# Patient Record
Sex: Female | Born: 1950 | Race: White | Hispanic: No | Marital: Married | Smoking: Never smoker
Health system: Southern US, Community
[De-identification: ages and names within clinical notes are randomized; demographics above are authoritative.]

## PROBLEM LIST (undated history)

## (undated) DIAGNOSIS — D649 Anemia, unspecified: Secondary | ICD-10-CM

## (undated) DIAGNOSIS — K5790 Diverticulosis of intestine, part unspecified, without perforation or abscess without bleeding: Secondary | ICD-10-CM

## (undated) DIAGNOSIS — S82092A Other fracture of left patella, initial encounter for closed fracture: Secondary | ICD-10-CM

## (undated) DIAGNOSIS — Z5189 Encounter for other specified aftercare: Secondary | ICD-10-CM

## (undated) DIAGNOSIS — J302 Other seasonal allergic rhinitis: Secondary | ICD-10-CM

## (undated) DIAGNOSIS — T7840XA Allergy, unspecified, initial encounter: Secondary | ICD-10-CM

## (undated) DIAGNOSIS — M1990 Unspecified osteoarthritis, unspecified site: Secondary | ICD-10-CM

## (undated) DIAGNOSIS — J189 Pneumonia, unspecified organism: Secondary | ICD-10-CM

## (undated) DIAGNOSIS — R51 Headache: Secondary | ICD-10-CM

## (undated) DIAGNOSIS — K219 Gastro-esophageal reflux disease without esophagitis: Secondary | ICD-10-CM

## (undated) DIAGNOSIS — J40 Bronchitis, not specified as acute or chronic: Secondary | ICD-10-CM

## (undated) DIAGNOSIS — I10 Essential (primary) hypertension: Secondary | ICD-10-CM

## (undated) HISTORY — DX: Anemia, unspecified: D64.9

## (undated) HISTORY — DX: Unspecified osteoarthritis, unspecified site: M19.90

## (undated) HISTORY — PX: JOINT REPLACEMENT: SHX530

## (undated) HISTORY — PX: LIPOMA EXCISION: SHX5283

## (undated) HISTORY — PX: SMALL INTESTINE SURGERY: SHX150

## (undated) HISTORY — PX: COLONOSCOPY: SHX174

## (undated) HISTORY — DX: Allergy, unspecified, initial encounter: T78.40XA

## (undated) HISTORY — PX: COLON SURGERY: SHX602

## (undated) HISTORY — DX: Encounter for other specified aftercare: Z51.89

## (undated) HISTORY — PX: ABDOMINAL HYSTERECTOMY: SHX81

## (undated) HISTORY — PX: HERNIA REPAIR: SHX51

## (undated) HISTORY — PX: UPPER GASTROINTESTINAL ENDOSCOPY: SHX188

## (undated) HISTORY — PX: OTHER SURGICAL HISTORY: SHX169

---

## 1999-07-16 ENCOUNTER — Emergency Department (HOSPITAL_COMMUNITY): Admission: EM | Admit: 1999-07-16 | Discharge: 1999-07-16 | Payer: Self-pay | Admitting: Emergency Medicine

## 1999-07-23 ENCOUNTER — Emergency Department (HOSPITAL_COMMUNITY): Admission: EM | Admit: 1999-07-23 | Discharge: 1999-07-23 | Payer: Self-pay | Admitting: Emergency Medicine

## 2000-05-19 ENCOUNTER — Emergency Department (HOSPITAL_COMMUNITY): Admission: EM | Admit: 2000-05-19 | Discharge: 2000-05-19 | Payer: Self-pay | Admitting: Emergency Medicine

## 2000-05-22 ENCOUNTER — Emergency Department (HOSPITAL_COMMUNITY): Admission: EM | Admit: 2000-05-22 | Discharge: 2000-05-22 | Payer: Self-pay | Admitting: Emergency Medicine

## 2001-05-12 ENCOUNTER — Emergency Department (HOSPITAL_COMMUNITY): Admission: EM | Admit: 2001-05-12 | Discharge: 2001-05-12 | Payer: Self-pay | Admitting: *Deleted

## 2001-05-12 ENCOUNTER — Encounter: Payer: Self-pay | Admitting: *Deleted

## 2003-11-30 ENCOUNTER — Emergency Department (HOSPITAL_COMMUNITY): Admission: EM | Admit: 2003-11-30 | Discharge: 2003-11-30 | Payer: Self-pay | Admitting: Emergency Medicine

## 2011-02-04 ENCOUNTER — Emergency Department (HOSPITAL_COMMUNITY): Payer: BC Managed Care – PPO

## 2011-02-04 ENCOUNTER — Observation Stay (HOSPITAL_COMMUNITY)
Admission: EM | Admit: 2011-02-04 | Discharge: 2011-02-05 | Disposition: A | Discharge status: Home / Self Care | Payer: BC Managed Care – PPO | Attending: Internal Medicine | Admitting: Internal Medicine

## 2011-02-04 DIAGNOSIS — Z79899 Other long term (current) drug therapy: Secondary | ICD-10-CM | POA: Insufficient documentation

## 2011-02-04 DIAGNOSIS — R112 Nausea with vomiting, unspecified: Secondary | ICD-10-CM | POA: Insufficient documentation

## 2011-02-04 DIAGNOSIS — R55 Syncope and collapse: Secondary | ICD-10-CM | POA: Insufficient documentation

## 2011-02-04 DIAGNOSIS — R42 Dizziness and giddiness: Principal | ICD-10-CM | POA: Insufficient documentation

## 2011-02-04 DIAGNOSIS — I10 Essential (primary) hypertension: Secondary | ICD-10-CM | POA: Insufficient documentation

## 2011-02-04 DIAGNOSIS — E876 Hypokalemia: Secondary | ICD-10-CM | POA: Insufficient documentation

## 2011-02-04 DIAGNOSIS — R9431 Abnormal electrocardiogram [ECG] [EKG]: Secondary | ICD-10-CM | POA: Insufficient documentation

## 2011-02-04 DIAGNOSIS — K921 Melena: Secondary | ICD-10-CM | POA: Insufficient documentation

## 2011-02-04 DIAGNOSIS — R197 Diarrhea, unspecified: Secondary | ICD-10-CM | POA: Insufficient documentation

## 2011-02-04 DIAGNOSIS — J301 Allergic rhinitis due to pollen: Secondary | ICD-10-CM | POA: Insufficient documentation

## 2011-02-04 DIAGNOSIS — N398 Other specified disorders of urinary system: Secondary | ICD-10-CM | POA: Insufficient documentation

## 2011-02-04 LAB — URINALYSIS, ROUTINE W REFLEX MICROSCOPIC
Glucose, UA: NEGATIVE mg/dL
Ketones, ur: NEGATIVE mg/dL
Leukocytes, UA: NEGATIVE
Protein, ur: NEGATIVE mg/dL

## 2011-02-04 LAB — OCCULT BLOOD, POC DEVICE: Fecal Occult Bld: POSITIVE

## 2011-02-04 LAB — CBC
HCT: 34.9 % — ABNORMAL LOW (ref 36.0–46.0)
Hemoglobin: 11.7 g/dL — ABNORMAL LOW (ref 12.0–15.0)
MCH: 32.1 pg (ref 26.0–34.0)
MCHC: 33.5 g/dL (ref 30.0–36.0)
MCV: 95.6 fL (ref 78.0–100.0)
Platelets: 182 10*3/uL (ref 150–400)
RBC: 3.65 MIL/uL — ABNORMAL LOW (ref 3.87–5.11)
RDW: 12.8 % (ref 11.5–15.5)
WBC: 4.7 10*3/uL (ref 4.0–10.5)

## 2011-02-04 LAB — DIFFERENTIAL
Basophils Absolute: 0 10*3/uL (ref 0.0–0.1)
Basophils Relative: 0 % (ref 0–1)
Eosinophils Absolute: 0 10*3/uL (ref 0.0–0.7)
Eosinophils Relative: 0 % (ref 0–5)
Lymphocytes Relative: 13 % (ref 12–46)
Lymphs Abs: 0.6 10*3/uL — ABNORMAL LOW (ref 0.7–4.0)
Monocytes Absolute: 0.4 10*3/uL (ref 0.1–1.0)
Monocytes Relative: 9 % (ref 3–12)
Neutro Abs: 3.6 10*3/uL (ref 1.7–7.7)
Neutrophils Relative %: 77 % (ref 43–77)

## 2011-02-04 LAB — POCT I-STAT TROPONIN I
Troponin i, poc: 0.02 ng/mL (ref 0.00–0.08)
Troponin i, poc: 0 ng/mL (ref 0.00–0.08)

## 2011-02-04 LAB — COMPREHENSIVE METABOLIC PANEL
AST: 21 U/L (ref 0–37)
BUN: 13 mg/dL (ref 6–23)
CO2: 25 mEq/L (ref 19–32)
Chloride: 106 mEq/L (ref 96–112)
Creatinine, Ser: 0.48 mg/dL — ABNORMAL LOW (ref 0.50–1.10)
GFR calc Af Amer: >90 mL/min (ref >90)
GFR calc non Af Amer: >90 mL/min (ref >90)
Glucose, Bld: 133 mg/dL — ABNORMAL HIGH (ref 70–99)
Total Bilirubin: 0.4 mg/dL (ref 0.3–1.2)

## 2011-02-04 LAB — GLUCOSE, CAPILLARY: Glucose-Capillary: 124 mg/dL — ABNORMAL HIGH (ref 70–99)

## 2011-02-04 LAB — LACTIC ACID, PLASMA: Lactic Acid, Venous: 1.5 mmol/L (ref 0.5–2.2)

## 2011-02-04 NOTE — H&P (Signed)
NAME:  Alyssa Robinson, Alyssa Robinson NO.:  [id number]  MEDICAL RECORD NO.:  [id number]  LOCATION:  WLED                         FACILITY:  [facility]  PHYSICIAN:  Hillery Aldo, M.D.   DATE OF BIRTH:  September 28, 1950  DATE OF ADMISSION:  02/04/2011 DATE OF DISCHARGE:                             HISTORY & PHYSICAL   PRIMARY CARE PHYSICIAN:  Duke Salvia, MD, [facility] [place].  CHIEF COMPLAINT:  Nausea, vomiting, dizziness.  HISTORY OF PRESENT ILLNESS:  The patient is a 60 year old female who presented to the hospital with a 24-hour history of dizziness, vertigo, nausea, and vomiting.  The patient states that she was in her usual state of health and ate a normal dinner last night, but then developed nausea and vomiting.  She was able to sleep mostly through the night, but every time she got up to use the bathroom, she was dizzy and presyncopal.  She also reports that last week she had several episodes of melena and hematochezia.  She saw her primary care physician who did an anoscopy.  He did not see any abnormalities during that exam, but set her up to see a GI physician on February 16, 2011.  She reports that she has had a colonoscopy within the last 12-15 months and it did not show any evidence of polyps, but she did have diverticula noted.  The patient states that her dizziness is worse when standing and according to the emergency department physician, she had orthostatics vital signs, although these have not been documented as far as I can tell.  Because of her orthostasis and mild hypokalemia, we are asked to evaluate her.  PAST MEDICAL HISTORY: 1. Hypertension. 2. Seasonal allergies.  PAST SURGICAL HISTORY: 1. Hysterectomy. 2. Hernia repair x2.  FAMILY HISTORY:  Patient's father died at age 40 from heart disease and alcoholism.  The patient's mother died at 72 from esophageal cancer. She was also alcoholic and had a history of uterine cancer.  She  has 1 sister who is alive and healthy and no children.  SOCIAL HISTORY:  The patient is married.  She is a lifelong nonsmoker. She rarely drinks alcohol.  She is currently employed at a [employer].  ALLERGIES:  No known drug allergies, but she is very sensitive to CAFFEINE.  MEDICATIONS: 1. Amlodipine 5 mg p.o. daily. 2. Vitamin D3 daily. 3. Multivitamin daily. 4. Allegra 180 mg p.o. at bedtime.  REVIEW OF SYSTEMS:  CONSTITUTIONAL:  Positive for intermittent fever and chills.  Appetite has been normal.  No significant weight changes. HEENT:  Some left ear itchiness, but otherwise no complaints. CARDIOVASCULAR:  No chest pain, but occasional palpitations. RESPIRATORY:  Shortness of breath.  Cough which she attributes to a postnasal drip.  GI:  Positive for recent nausea and vomiting.  No diarrhea.  Recent blood in the stools.  Occasional constipation.  Last bowel movement was this morning and she reports that there was no obvious blood.  GU:  No dysuria.  MUSCULOSKELETAL:  Right hip arthralgias.  Comprehensive 14-point review of systems is otherwise unremarkable.  PHYSICAL EXAMINATION:  VITAL SIGNS:  Temperature 98.6, pulse 82, respirations 20, blood pressure 130/55,  O2 saturation 100% on room air. GENERAL:  Well-developed, well-nourished female, in no acute distress. HEENT:  Normocephalic, atraumatic.  PERRL.  EOMI.  Oropharynx is clear. NECK:  Supple, no thyromegaly, no lymphadenopathy, no jugular venous distention.  CHEST:  Lungs are clear to auscultation bilaterally with good air movement. HEART:  Regular rate, rhythm.  No murmurs, rubs, or gallops. ABDOMEN:  Soft, nontender, nondistended with normoactive bowel sounds. EXTREMITIES:  No clubbing, edema, or cyanosis. SKIN:  Warm and dry.  No rashes. NEUROLOGIC:  Patient is alert and oriented x3.  Cranial nerves II through XII grossly intact.  Nonfocal.  DATA REVIEW:  12-lead EKG shows normal sinus rhythm at  80 beats per minute.  There are nonspecific T-wave abnormalities in the anterior leads with T-wave inversions in V1, V2, and V3.  Acute abdominal series shows no acute cardiopulmonary process.  No evidence for bowel perforation or obstruction.  LABORATORY DATA:  Fecal occult blood testing was positive.  Troponin I 0.02.  White blood cell count was 4.7, hemoglobin 11.7, hematocrit 34.9, platelets 182.  Lactic acid was 1.5.  Sodium was 141, potassium 3.3, chloride 106, bicarb 25, BUN 13, creatinine 0.48, glucose 133.  Liver function studies were completely within normal limits.  Lipase was 25. Urinalysis was negative for nitrites and leukocytes with a specific gravity of 1.015.  It was turbid in appearance.  Microscopy was unremarkable.  ASSESSMENT/PLAN: 1. Nausea and vomiting:  Possibly due to a self-limited viral     gastroenteritis or this could be a manifestation of vertigo.  We     will admit the patient and rehydrate her given that she has     orthostatic changes and is likely mildly dehydrated.  We will     provide antiemetics p.r.n. and use meclizine for any vertigo.  We     will hold her Norvasc for now.  She is nontoxic in appearance and     we will admit her for observation only. 2. Orthostasis:  Likely due to dehydration from nausea and vomiting.     We will hydrate her with 2 L of normal saline and recheck her     orthostatic vital signs in the morning.  We will hold her Norvasc. 3. Hypokalemia:  Likely due to GI losses.  We would replace her     potassium and IV fluids. 4. Recent GI bleeding:  The patient does have Hemoccult-positive     stool, but her hemoglobin is stable.  At this point, we will     monitor her H and H at 8:00 p.m. tonight and check a CBC again in     the morning and if her hemoglobin and hematocrit remained stable,     she can follow up as an outpatient with her GI doctor as previously     scheduled. 5. Turbid urine:  Patient's urine is turbid, so we  will send this for     urine culture. 6. EKG changes:  The patient denies chest pain.  We will cycle cardiac     markers q.6 hours x3 sets, check her thyroid function, and repeat     an EKG in the morning. Time spent on admission including face-to-face time equals approximately 1 hour.     Hillery Aldo, M.D.     CR/MEDQ  D:  02/04/2011  T:  02/04/2011  Job:  [id number]  cc:   Duke Salvia, MD, [facility] [address]  [address] [place] [address]  Electronically  Signed by Hillery Aldo M.D. on 02/04/2011 05:57:38 PM

## 2011-02-05 LAB — BASIC METABOLIC PANEL
Calcium: 8.6 mg/dL (ref 8.4–10.5)
GFR calc non Af Amer: >90 mL/min (ref >90)
Sodium: 142 mEq/L (ref 135–145)

## 2011-02-05 LAB — CK TOTAL AND CKMB (NOT AT ARMC)
CK, MB: 2.2 ng/mL (ref 0.3–4.0)
Total CK: 62 U/L (ref 7–177)
Total CK: 66 U/L (ref 7–177)

## 2011-02-05 LAB — CBC
Hemoglobin: 11.2 g/dL — ABNORMAL LOW (ref 12.0–15.0)
RBC: 3.57 MIL/uL — ABNORMAL LOW (ref 3.87–5.11)

## 2011-02-05 LAB — URINE CULTURE
Colony Count: NO GROWTH
Culture: NO GROWTH

## 2011-02-05 LAB — TSH: TSH: 3.147 u[IU]/mL (ref 0.350–4.500)

## 2011-02-05 LAB — TROPONIN I: Troponin I: <0.3 ng/mL (ref <0.30)

## 2011-02-05 NOTE — Discharge Summary (Signed)
Alyssa Robinson, BIRDWELL NO.:  [id number]  MEDICAL RECORD NO.:  [id number]  LOCATION:  1438                         FACILITY:  [facility]  PHYSICIAN:  Talmage Nap, MD  DATE OF BIRTH:  02-23-1951  DATE OF ADMISSION:  02/04/2011 DATE OF DISCHARGE:  02/05/2011                        DISCHARGE SUMMARY - REFERRING   PRIMARY CARE PHYSICIAN:  Duke Salvia, MD, [facility] in [place], [place].  DISCHARGE DIAGNOSES: 1. Vertigo, resolved, most likely benign positional vertigo. 2. Diarrhea, resolved, most likely viral.  The patient is to have a     followup/screening colonoscopy as an outpatient to be arranged by     her primary care physician. 3. Hematochezia, resolved. 4. Hypokalemia, corrected. 5. Hypertension-blood pressure, stable. 6. History of seasonal allergies.  Patient is a 60 year old Caucasian female with history of seasonal allergy as well as hypertension who was admitted to the hospital on February 04, 2011 by Dr. Trula Ore Rama with 1-day history of nausea and vomiting with dizzy spell.  The patient was said to have been in stable health when she developed the above symptoms.  She was said to have vomited several times and also had multiple episodes of the diarrhea with some tinge of blood.  She denied any associated abdominal pain. She denied any fever.  No chills, no rigor.  She denied any history of chest pain or shortness of breath.  The patient claims she became very dizzy, particularly when she gets up, but she denied any history of passing out.  The patient had a colonoscopy done about 12-15 months ago and had polyps removed.  According to the patient, the finding was that of polyps/diverticulosis.  However, the patient's symptom was said to have persisted, hence she presented to the emergency room because of orthostasis.  She was, however, admitted for stabilization.  PREADMISSION MEDICATIONS:  Include: 1. Amlodipine 5 mg p.o.  daily. 2. Vitamin D3 one p.o. daily. 3. Multivitamin one p.o. daily. 4. Allegra 180 mg p.o. at bedtime.  ALLERGIES:  She has no known allergies.  PAST SURGICAL HISTORY: 1. Hysterectomy. 2. Hernia repair. 3. Colonoscopy s/p polypectomy.  FAMILY HISTORY:  Refer to the initial history and physical dictated by Dr. Hillery Aldo.  SOCIAL HISTORY:  Refer to the initial history and physical dictated by Dr. Hillery Aldo.  REVIEW OF SYSTEMS:  Refer to the initial history and physical dictated by Dr. Hillery Aldo.  PHYSICAL EXAMINATION:  At time patient was seen by the admitting physician; VITAL SIGNS:  Temperature 98.6, pulse 82, respiratory rate 20, blood pressure was 130/54, and she was said to be saturating 100% on room air. HEENT:  Pupils were reactive to light and extraocular muscles were intact. NECK:  No jugular venous distention.  No carotid bruit and no lymphadenopathy. CHEST:  Said to be clear to auscultation. HEART SOUNDS:  1 and 2.  No murmur. ABDOMEN:  Soft, nontender.  Liver and spleen tip not palpable.  Bowel sounds were positive. EXTREMITIES:  No pedal edema. NEUROLOGIC EXAM:  Nonfocal. MUSCULOSKELETAL SYSTEM:  Unremarkable. SKIN:  Showed decreased turgor.  LAB DATA:  Fecal occult blood test positive.  Three set of cardiac markers unremarkable.  Complete blood count  and differential showed WBC of 4.7, hemoglobin of 11.7, hematocrit of 34.9, MCV of 95.6, platelet count of 182.  Lactic acid level was 1.5.  Comprehensive metabolic panel showed sodium of 141, potassium of 3.3, chloride of 106 with a bicarb of 25.  Glucose is 133, BUN is 13, creatinine is 0.48.  LFTs are normal. Lipase 25, normal.  Urinalysis unremarkable.  Urine microscopy unremarkable.  A repeat complete blood count with no differential done on February 05, 2011 showed WBC of 6.2, hemoglobin of 11.2, hematocrit of 35.0, MCV of 98.0 with a platelet count of 188.  Basic metabolic panel showed  sodium of 142, potassium of 3.5, chloride of 109 with a bicarb of 26. Glucose is 94.  BUN is 8, creatinine is 0.58.  EKG done on admission was said to have showed normal sinus rhythm with a rate of 80, nonspecific T-wave abnormalities in the anterior leads with T-wave inversion in the inferior leads.  IMAGING STUDIES:  Done include acute abdominal series, which was essentially unremarkable.  HOSPITAL COURSE:  The patient was admitted to general medical floor with an impression of presyncope and lower GI bleed.  She was started on normal saline with 40 mEq of KCl to go at rate of 100 cc an hour x2 liters and subsequently saline locked.  She was placed on SCD boots for DVT prophylaxis, Zofran for nausea as well as Phenergan and Mylanta for dyspepsia. She was also given Tylenol for mild pain control.  Also given to patient was meclizine 25 mg p.o. q.6 p.r.n. for vertigo.  Other medications given to the patient include vitamin D 2000 units p.o. daily, loratadine 10 mg p.o. daily and Maalox 30 cc p.o. q.6 p.r.n.  The patient was seen by me for the very first time in this index admission today and denied any complaint, i.e., no dizzy spells, no diarrhea, no fever, no chills, no rigor.  Examination showed the patient to be well-hydrated and her vital signs; blood pressure is 136/75, temperature is 97.8, pulse 69, respiratory rate 16.  No orthostasis.  Plan is for the patient to be discharged home today on activity as tolerated.  Low-sodium, low-cholesterol diet.  She will be followed up by her primary care doctor in 1-2 weeks.  I had an extensive discussion with the patient regarding colonoscopy and the plan is to follow up with her PCP who will make arrangement for colonoscopy if necessary.  MEDICATIONS:  To be taken at home include: 1. Allegra (fexofenadine) 180 mg one p.o. daily at bedtime. 2. Amlodipine 5 mg one p.o. q.a.m. 3. Ayr nasal spray, one spray nasally daily p.r.n. 4.  Multivitamin one p.o. daily. 5. Vitamin D3 2000 units one p.o. daily.     Talmage Nap, MD     CN/MEDQ  D:  02/05/2011  T:  02/05/2011  Job:  [id number]  cc:   Duke Salvia, MD, [facility] [address]  [address] [place] [address]  Electronically Signed by Talmage Nap  on 02/05/2011 06:55:57 PM

## 2011-05-04 LAB — HM COLONOSCOPY

## 2011-12-07 ENCOUNTER — Encounter (HOSPITAL_COMMUNITY): Payer: Self-pay | Admitting: Adult Health

## 2011-12-07 ENCOUNTER — Emergency Department (HOSPITAL_COMMUNITY)
Admission: EM | Admit: 2011-12-07 | Discharge: 2011-12-07 | Disposition: A | Discharge status: Home / Self Care | Payer: BC Managed Care – PPO | Attending: Emergency Medicine | Admitting: Emergency Medicine

## 2011-12-07 DIAGNOSIS — Y92009 Unspecified place in unspecified non-institutional (private) residence as the place of occurrence of the external cause: Secondary | ICD-10-CM | POA: Insufficient documentation

## 2011-12-07 DIAGNOSIS — W5321XA Bitten by squirrel, initial encounter: Secondary | ICD-10-CM

## 2011-12-07 DIAGNOSIS — Z203 Contact with and (suspected) exposure to rabies: Secondary | ICD-10-CM | POA: Insufficient documentation

## 2011-12-07 DIAGNOSIS — I10 Essential (primary) hypertension: Secondary | ICD-10-CM | POA: Insufficient documentation

## 2011-12-07 DIAGNOSIS — Z23 Encounter for immunization: Secondary | ICD-10-CM | POA: Insufficient documentation

## 2011-12-07 DIAGNOSIS — IMO0001 Reserved for inherently not codable concepts without codable children: Secondary | ICD-10-CM | POA: Insufficient documentation

## 2011-12-07 HISTORY — DX: Essential (primary) hypertension: I10

## 2011-12-07 HISTORY — DX: Diverticulosis of intestine, part unspecified, without perforation or abscess without bleeding: K57.90

## 2011-12-07 MED ORDER — RABIES VACCINE, PCEC IM SUSR
1.0000 mL | Freq: Once | INTRAMUSCULAR | Status: AC
  Administered 2011-12-07: 1 mL via INTRAMUSCULAR
  Filled 2011-12-07: qty 1

## 2011-12-07 MED ORDER — RABIES IMMUNE GLOBULIN 150 UNIT/ML IM INJ
1000.0000 [IU] | INJECTION | Freq: Once | INTRAMUSCULAR | Status: AC
  Administered 2011-12-07: 975 [IU]
  Filled 2011-12-07: qty 8

## 2011-12-07 NOTE — ED Notes (Signed)
Bitten by a squirrel 45 minutes ago on the right leg above the knee. Bleeding controlled.

## 2011-12-07 NOTE — ED Provider Notes (Signed)
History  Scribed for Raeford Razor, MD, the patient was seen in room TR05C/TR05C. This chart was scribed by Candelaria Stagers. The patient's care started at 4:41 PM   CSN: [id number]  Arrival date & time 12/07/11  1625   First MD Initiated Contact with Patient 12/07/11 1638      Chief Complaint  Patient presents with  . Animal Bite     The history is provided by the patient.   Alyssa Robinson is a 61 y.o. female who presents to the Emergency Department complaining of an animal bite to the upper right leg after a squirrel jumped on her leg, biting and scratching her, while sitting by the pool about one hour ago.  She has no other sx.  Bleeding is controlled.     Past Medical History  Diagnosis Date  . Diverticulosis   . Hypertension     Past Surgical History  Procedure Date  . Upper gastrointestinal endoscopy   . Colonoscopy   . Hernia repair     History reviewed. No pertinent family history.  History  Substance Use Topics  . Smoking status: Never Smoker   . Smokeless tobacco: Not on file  . Alcohol Use: Yes    OB History    Grav Para Term Preterm Abortions TAB SAB Ect Mult Living                  Review of Systems  Constitutional: Negative for fever.       10 Systems reviewed and are negative for acute change except as noted in the HPI.  HENT: Negative for congestion.   Eyes: Negative for discharge and redness.  Respiratory: Negative for cough and shortness of breath.   Cardiovascular: Negative for chest pain.  Gastrointestinal: Negative for vomiting and abdominal pain.  Musculoskeletal: Negative for back pain.  Skin: Positive for wound (presumed bite to the upper right leg). Negative for rash.  Neurological: Negative for syncope, numbness and headaches.  Psychiatric/Behavioral:       No behavior change.    Allergies  Caffeine  Home Medications  No current outpatient prescriptions on file.  BP 177/86  Pulse 80  Temp 97.9 F (36.6 C) (Oral)  Resp 16   SpO2 96%  Physical Exam  Nursing note and vitals reviewed. Constitutional:       Awake, alert, nontoxic appearance.  HENT:  Head: Atraumatic.  Eyes: Right eye exhibits no discharge. Left eye exhibits no discharge.  Neck: Neck supple.  Pulmonary/Chest: Effort normal. She exhibits no tenderness.  Abdominal: Soft. There is no tenderness. There is no rebound.  Musculoskeletal: She exhibits no tenderness.       Distal aspect of anteriro right thigh there are two small wounds less than cm in size.  No active bleeding, no drainage, no surrounding signs of infection.  To the medial aspect of the right thigh there are superficial abrasions.      Neurological:       Mental status and motor strength appears baseline for patient and situation.  Skin: No rash noted.  Psychiatric: She has a normal mood and affect.    ED Course  Procedures   DIAGNOSTIC STUDIES: Oxygen Saturation is 96% on room air, noraml by my interpretation.    COORDINATION OF CARE:     Labs Reviewed - No data to display No results found.   1. Bitten by squirrel       MDM  61 year old female presenting after being bitten by a squirrel. Unusual  behavior for such an animal. Squirrel not able to be captured. Plan rabies prophylaxis. Discussed with pt need for 3 more doses.  I personally preformed the services scribed in my presence. The recorded information has been reviewed and considered. Raeford Razor, MD.         Raeford Razor, MD 12/15/11 [phone]

## 2011-12-09 NOTE — ED Notes (Signed)
Copy of schedule faxed to [facility]

## 2011-12-10 ENCOUNTER — Encounter (HOSPITAL_COMMUNITY): Payer: Self-pay | Admitting: *Deleted

## 2011-12-10 ENCOUNTER — Emergency Department (HOSPITAL_COMMUNITY)
Admission: EM | Admit: 2011-12-10 | Discharge: 2011-12-10 | Disposition: A | Discharge status: Home / Self Care | Payer: BC Managed Care – PPO | Source: Home / Self Care

## 2011-12-10 DIAGNOSIS — Z23 Encounter for immunization: Secondary | ICD-10-CM

## 2011-12-10 HISTORY — DX: Other seasonal allergic rhinitis: J30.2

## 2011-12-10 HISTORY — DX: Bronchitis, not specified as acute or chronic: J40

## 2011-12-10 MED ORDER — RABIES VACCINE, PCEC IM SUSR
1.0000 mL | Freq: Once | INTRAMUSCULAR | Status: AC
  Administered 2011-12-10: 1 mL via INTRAMUSCULAR

## 2011-12-10 MED ORDER — RABIES VACCINE, PCEC IM SUSR
INTRAMUSCULAR | Status: AC
  Filled 2011-12-10: qty 1

## 2011-12-10 NOTE — ED Notes (Signed)
Rabies schedule not in rabies book.  I called flow manager and she will fax [place] schedule.  She also stated she needed additional information regarding how patient was bitten.  I spoke with patient and she stated she was sitting at pool and squirrel jumped on lounge chair and bit her leg.

## 2011-12-14 ENCOUNTER — Emergency Department (INDEPENDENT_AMBULATORY_CARE_PROVIDER_SITE_OTHER)
Admission: EM | Admit: 2011-12-14 | Discharge: 2011-12-14 | Disposition: A | Discharge status: Home / Self Care | Payer: BC Managed Care – PPO | Source: Home / Self Care

## 2011-12-14 ENCOUNTER — Encounter (HOSPITAL_COMMUNITY): Payer: Self-pay | Admitting: *Deleted

## 2011-12-14 DIAGNOSIS — Z23 Encounter for immunization: Secondary | ICD-10-CM

## 2011-12-14 MED ORDER — RABIES VACCINE, PCEC IM SUSR
1.0000 mL | Freq: Once | INTRAMUSCULAR | Status: AC
  Administered 2011-12-14: 1 mL via INTRAMUSCULAR

## 2011-12-14 MED ORDER — RABIES VACCINE, PCEC IM SUSR
INTRAMUSCULAR | Status: AC
  Filled 2011-12-14: qty 1

## 2011-12-14 NOTE — ED Notes (Signed)
Here for third rabies vaccine for squirrel bite.  Puncture wounds to R ant. thigh healing.  No signs of infection.  Some bruising between the wounds.

## 2011-12-21 ENCOUNTER — Encounter (HOSPITAL_COMMUNITY): Payer: Self-pay | Admitting: *Deleted

## 2011-12-21 ENCOUNTER — Emergency Department (INDEPENDENT_AMBULATORY_CARE_PROVIDER_SITE_OTHER)
Admission: EM | Admit: 2011-12-21 | Discharge: 2011-12-21 | Disposition: A | Discharge status: Home / Self Care | Payer: BC Managed Care – PPO | Source: Home / Self Care

## 2011-12-21 DIAGNOSIS — Z23 Encounter for immunization: Secondary | ICD-10-CM

## 2011-12-21 MED ORDER — RABIES VACCINE, PCEC IM SUSR
1.0000 mL | Freq: Once | INTRAMUSCULAR | Status: AC
  Administered 2011-12-21: 1 mL via INTRAMUSCULAR

## 2011-12-21 MED ORDER — RABIES VACCINE, PCEC IM SUSR
INTRAMUSCULAR | Status: AC
  Filled 2011-12-21: qty 1

## 2011-12-21 NOTE — ED Notes (Signed)
Here for last rabies vaccine for squirrel bite to R ant. thigh.  Wounds healed and bruising has faded and is minimal.

## 2012-09-06 ENCOUNTER — Encounter (HOSPITAL_COMMUNITY): Payer: Self-pay | Admitting: Emergency Medicine

## 2012-09-06 ENCOUNTER — Emergency Department (HOSPITAL_COMMUNITY)
Admission: EM | Admit: 2012-09-06 | Discharge: 2012-09-06 | Disposition: A | Discharge status: Home / Self Care | Payer: BC Managed Care – PPO | Source: Home / Self Care | Attending: Family Medicine | Admitting: Family Medicine

## 2012-09-06 DIAGNOSIS — J309 Allergic rhinitis, unspecified: Secondary | ICD-10-CM

## 2012-09-06 DIAGNOSIS — J302 Other seasonal allergic rhinitis: Secondary | ICD-10-CM

## 2012-09-06 MED ORDER — TRIAMCINOLONE ACETONIDE 40 MG/ML IJ SUSP
INTRAMUSCULAR | Status: AC
  Filled 2012-09-06: qty 5

## 2012-09-06 MED ORDER — METHYLPREDNISOLONE ACETATE 40 MG/ML IJ SUSP
80.0000 mg | Freq: Once | INTRAMUSCULAR | Status: AC
Start: 2012-09-06 — End: 2012-09-06
  Administered 2012-09-06: 80 mg via INTRAMUSCULAR

## 2012-09-06 MED ORDER — TRIAMCINOLONE ACETONIDE 40 MG/ML IJ SUSP
40.0000 mg | Freq: Once | INTRAMUSCULAR | Status: AC
  Administered 2012-09-06: 40 mg via INTRAMUSCULAR

## 2012-09-06 MED ORDER — METHYLPREDNISOLONE ACETATE 80 MG/ML IJ SUSP
INTRAMUSCULAR | Status: AC
  Filled 2012-09-06: qty 1

## 2012-09-06 MED ORDER — FLUTICASONE PROPIONATE 50 MCG/ACT NA SUSP: 1.0000 | Freq: Two times a day (BID) | NASAL | Status: DC

## 2012-09-06 NOTE — ED Notes (Signed)
Pt c/o cold/allergy sx onset 1 week Started out a ST; now sx include: nasal congestion, dry cough, runny nose, mild facial pressure Denies: f/v/n/d Taking allegra, mucinex and robitussin for sx Hx of seasonal allergies and bronchitis  She is alert and oriented w/no signs of acute distress.

## 2012-09-06 NOTE — ED Provider Notes (Signed)
History     CSN: [id number]  Arrival date & time 09/06/12  1350   First MD Initiated Contact with Patient 09/06/12 1431      Chief Complaint  Patient presents with  . URI    (Consider location/radiation/quality/duration/timing/severity/associated sxs/prior treatment) Patient is a 62 y.o. female presenting with URI. The history is provided by the patient.  URI Presenting symptoms: congestion, cough, rhinorrhea and sore throat   Presenting symptoms: no fever   Severity:  Mild Duration:  1 week Progression:  Unchanged Chronicity:  New Ineffective treatments:  OTC medications Associated symptoms: sneezing   Associated symptoms: no wheezing     Past Medical History  Diagnosis Date  . Diverticulosis   . Hypertension   . Seasonal allergies   . Bronchitis     Past Surgical History  Procedure Laterality Date  . Upper gastrointestinal endoscopy    . Colonoscopy    . Hernia repair    . Abdominal hysterectomy      No family history on file.  History  Substance Use Topics  . Smoking status: Never Smoker   . Smokeless tobacco: Not on file  . Alcohol Use: Yes     Comment: occasional    OB History   Grav Para Term Preterm Abortions TAB SAB Ect Mult Living                  Review of Systems  Constitutional: Negative for fever.  HENT: Positive for congestion, sore throat, rhinorrhea, sneezing and postnasal drip.   Respiratory: Positive for cough. Negative for shortness of breath and wheezing.   Cardiovascular: Negative.     Allergies  Caffeine  Home Medications   Current Outpatient Rx  Name  Route  Sig  Dispense  Refill  . acetaminophen (TYLENOL) 500 MG tablet   Oral   Take 1,000 mg by mouth every 6 (six) hours as needed.         . cholecalciferol (VITAMIN D) 1000 UNITS tablet   Oral   Take 1,000 Units by mouth daily.         . ferrous sulfate 325 (65 FE) MG tablet   Oral   Take 325 mg by mouth daily with breakfast.         . fluticasone  (FLONASE) 50 MCG/ACT nasal spray   Nasal   Place 1 spray into the nose 2 (two) times daily.   1 g   2   . OVER THE COUNTER MEDICATION   Oral   Take 1 tablet by mouth daily. "Flinstones" Multivitamin         . Saline (AYR NA)   Nasal   Place into the nose.           BP 157/80  Pulse 74  Temp(Src) 98.1 F (36.7 C) (Oral)  Resp 18  SpO2 98%  Physical Exam  Nursing note and vitals reviewed. Constitutional: She is oriented to person, place, and time. She appears well-developed and well-nourished.  HENT:  Head: Normocephalic.  Right Ear: External ear normal.  Left Ear: External ear normal.  Nose: Mucosal edema and rhinorrhea present.  Mouth/Throat: Oropharynx is clear and moist.  Neck: Normal range of motion. Neck supple.  Cardiovascular: Normal rate, normal heart sounds and intact distal pulses.   Pulmonary/Chest: Effort normal and breath sounds normal. She has no wheezes.  Lymphadenopathy:    She has no cervical adenopathy.  Neurological: She is alert and oriented to person, place, and time.  Skin: Skin is  warm and dry.    ED Course  Procedures (including critical care time)  Labs Reviewed - No data to display No results found.   1. Seasonal allergic rhinitis       MDM          Linna Hoff, MD 09/06/12 1444

## 2012-10-17 ENCOUNTER — Encounter (HOSPITAL_COMMUNITY): Payer: Self-pay | Admitting: Anesthesiology

## 2012-10-17 ENCOUNTER — Encounter (HOSPITAL_COMMUNITY): Admission: EM | Disposition: A | Discharge status: Home / Self Care | Payer: Self-pay | Source: Home / Self Care

## 2012-10-17 ENCOUNTER — Emergency Department (HOSPITAL_COMMUNITY): Payer: BC Managed Care – PPO

## 2012-10-17 ENCOUNTER — Inpatient Hospital Stay (HOSPITAL_COMMUNITY)
Admission: EM | Admit: 2012-10-17 | Discharge: 2012-10-25 | DRG: 148 | Disposition: A | Discharge status: Home / Self Care | Payer: BC Managed Care – PPO | Attending: Surgery | Admitting: Surgery

## 2012-10-17 ENCOUNTER — Encounter (HOSPITAL_COMMUNITY): Payer: Self-pay | Admitting: Emergency Medicine

## 2012-10-17 ENCOUNTER — Inpatient Hospital Stay (HOSPITAL_COMMUNITY): Payer: BC Managed Care – PPO | Admitting: Anesthesiology

## 2012-10-17 DIAGNOSIS — R110 Nausea: Secondary | ICD-10-CM | POA: Diagnosis present

## 2012-10-17 DIAGNOSIS — K929 Disease of digestive system, unspecified: Secondary | ICD-10-CM | POA: Diagnosis present

## 2012-10-17 DIAGNOSIS — K37 Unspecified appendicitis: Secondary | ICD-10-CM | POA: Diagnosis present

## 2012-10-17 DIAGNOSIS — R112 Nausea with vomiting, unspecified: Secondary | ICD-10-CM | POA: Diagnosis present

## 2012-10-17 DIAGNOSIS — K5732 Diverticulitis of large intestine without perforation or abscess without bleeding: Secondary | ICD-10-CM | POA: Diagnosis present

## 2012-10-17 DIAGNOSIS — K659 Peritonitis, unspecified: Principal | ICD-10-CM | POA: Diagnosis present

## 2012-10-17 DIAGNOSIS — Y833 Surgical operation with formation of external stoma as the cause of abnormal reaction of the patient, or of later complication, without mention of misadventure at the time of the procedure: Secondary | ICD-10-CM | POA: Diagnosis present

## 2012-10-17 DIAGNOSIS — K560 Paralytic ileus: Secondary | ICD-10-CM | POA: Diagnosis present

## 2012-10-17 DIAGNOSIS — R69 Illness, unspecified: Secondary | ICD-10-CM | POA: Diagnosis present

## 2012-10-17 DIAGNOSIS — K219 Gastro-esophageal reflux disease without esophagitis: Secondary | ICD-10-CM | POA: Diagnosis present

## 2012-10-17 DIAGNOSIS — K631 Perforation of intestine (nontraumatic): Secondary | ICD-10-CM

## 2012-10-17 DIAGNOSIS — R198 Other specified symptoms and signs involving the digestive system and abdomen: Secondary | ICD-10-CM | POA: Diagnosis present

## 2012-10-17 DIAGNOSIS — A419 Sepsis, unspecified organism: Secondary | ICD-10-CM

## 2012-10-17 DIAGNOSIS — K5900 Constipation, unspecified: Secondary | ICD-10-CM | POA: Diagnosis present

## 2012-10-17 DIAGNOSIS — R300 Dysuria: Secondary | ICD-10-CM | POA: Diagnosis present

## 2012-10-17 DIAGNOSIS — R1031 Right lower quadrant pain: Secondary | ICD-10-CM | POA: Diagnosis present

## 2012-10-17 HISTORY — PX: LAPAROTOMY: SHX154

## 2012-10-17 LAB — CBC WITH DIFFERENTIAL/PLATELET
Eosinophils Relative: 1 % (ref 0–5)
HCT: 43.3 % (ref 36.0–46.0)
Hemoglobin: 14.7 g/dL (ref 12.0–15.0)
Lymphocytes Relative: 20 % (ref 12–46)
Lymphs Abs: 2.2 10*3/uL (ref 0.7–4.0)
MCV: 97.5 fL (ref 78.0–100.0)
Monocytes Absolute: 1.6 10*3/uL — ABNORMAL HIGH (ref 0.1–1.0)
Monocytes Relative: 15 % — ABNORMAL HIGH (ref 3–12)
RBC: 4.44 MIL/uL (ref 3.87–5.11)
WBC: 10.8 10*3/uL — ABNORMAL HIGH (ref 4.0–10.5)

## 2012-10-17 LAB — CBC
HCT: 44.6 % (ref 36.0–46.0)
Hemoglobin: 15.2 g/dL — ABNORMAL HIGH (ref 12.0–15.0)
MCH: 33.6 pg (ref 26.0–34.0)
MCHC: 34.1 g/dL (ref 30.0–36.0)
MCV: 98.7 fL (ref 78.0–100.0)
RBC: 4.52 MIL/uL (ref 3.87–5.11)

## 2012-10-17 LAB — TYPE AND SCREEN
ABO/RH(D): O POS
Antibody Screen: NEGATIVE

## 2012-10-17 LAB — COMPREHENSIVE METABOLIC PANEL
ALT: 15 U/L (ref 0–35)
BUN: 15 mg/dL (ref 6–23)
CO2: 27 mEq/L (ref 19–32)
Calcium: 9.3 mg/dL (ref 8.4–10.5)
Creatinine, Ser: 0.68 mg/dL (ref 0.50–1.10)
GFR calc Af Amer: >90 mL/min (ref >90)
GFR calc non Af Amer: >90 mL/min (ref >90)
Glucose, Bld: 143 mg/dL — ABNORMAL HIGH (ref 70–99)
Sodium: 139 mEq/L (ref 135–145)

## 2012-10-17 LAB — URINALYSIS, ROUTINE W REFLEX MICROSCOPIC
Bilirubin Urine: NEGATIVE
Nitrite: NEGATIVE
Specific Gravity, Urine: 1.044 — ABNORMAL HIGH (ref 1.005–1.030)
Urobilinogen, UA: 0.2 mg/dL (ref 0.0–1.0)

## 2012-10-17 LAB — URINE MICROSCOPIC-ADD ON

## 2012-10-17 LAB — LACTIC ACID, PLASMA: Lactic Acid, Venous: 2.1 mmol/L (ref 0.5–2.2)

## 2012-10-17 SURGERY — LAPAROTOMY, EXPLORATORY
Anesthesia: General | Site: Abdomen | Wound class: Dirty or Infected

## 2012-10-17 MED ORDER — FENTANYL CITRATE 0.05 MG/ML IJ SOLN
50.0000 ug | Freq: Once | INTRAMUSCULAR | Status: AC
  Administered 2012-10-17: 50 ug via INTRAVENOUS
  Filled 2012-10-17: qty 2

## 2012-10-17 MED ORDER — LIDOCAINE HCL (CARDIAC) 20 MG/ML IV SOLN
INTRAVENOUS | Status: DC | PRN
  Administered 2012-10-17: 80 mg via INTRAVENOUS

## 2012-10-17 MED ORDER — POTASSIUM CHLORIDE IN NACL 20-0.9 MEQ/L-% IV SOLN
INTRAVENOUS | Status: DC
  Administered 2012-10-17 – 2012-10-18 (×2): via INTRAVENOUS
  Filled 2012-10-17 (×4): qty 1000

## 2012-10-17 MED ORDER — NEOSTIGMINE METHYLSULFATE 1 MG/ML IJ SOLN
INTRAMUSCULAR | Status: DC | PRN
  Administered 2012-10-17: 3 mg via INTRAVENOUS

## 2012-10-17 MED ORDER — 0.9 % SODIUM CHLORIDE (POUR BTL) OPTIME
TOPICAL | Status: DC | PRN
  Administered 2012-10-17 (×8): 1000 mL

## 2012-10-17 MED ORDER — ROCURONIUM BROMIDE 100 MG/10ML IV SOLN
INTRAVENOUS | Status: DC | PRN
  Administered 2012-10-17: 10 mg via INTRAVENOUS
  Administered 2012-10-17: 20 mg via INTRAVENOUS

## 2012-10-17 MED ORDER — SODIUM CHLORIDE 0.9 % IV BOLUS (SEPSIS)
1000.0000 mL | Freq: Once | INTRAVENOUS | Status: AC
  Administered 2012-10-17: 1000 mL via INTRAVENOUS

## 2012-10-17 MED ORDER — DIPHENHYDRAMINE HCL 50 MG/ML IJ SOLN: 12.5000 mg | Freq: Four times a day (QID) | INTRAMUSCULAR | Status: DC | PRN

## 2012-10-17 MED ORDER — HYDROMORPHONE HCL PF 1 MG/ML IJ SOLN
0.2500 mg | INTRAMUSCULAR | Status: DC | PRN
  Administered 2012-10-17: 0.25 mg via INTRAVENOUS
  Administered 2012-10-17: 0.5 mg via INTRAVENOUS

## 2012-10-17 MED ORDER — DIPHENHYDRAMINE HCL 12.5 MG/5ML PO ELIX: 12.5000 mg | ORAL_SOLUTION | Freq: Four times a day (QID) | ORAL | Status: DC | PRN

## 2012-10-17 MED ORDER — LACTATED RINGERS IV SOLN
INTRAVENOUS | Status: DC
  Administered 2012-10-17: 11:00:00 via INTRAVENOUS

## 2012-10-17 MED ORDER — IOHEXOL 300 MG/ML  SOLN
80.0000 mL | Freq: Once | INTRAMUSCULAR | Status: AC | PRN
  Administered 2012-10-17: 80 mL via INTRAVENOUS

## 2012-10-17 MED ORDER — PIPERACILLIN-TAZOBACTAM 3.375 G IVPB
3.3750 g | Freq: Three times a day (TID) | INTRAVENOUS | Status: DC
  Filled 2012-10-17 (×2): qty 50

## 2012-10-17 MED ORDER — SUCCINYLCHOLINE CHLORIDE 20 MG/ML IJ SOLN
INTRAMUSCULAR | Status: DC | PRN
  Administered 2012-10-17: 100 mg via INTRAVENOUS

## 2012-10-17 MED ORDER — ONDANSETRON HCL 4 MG/2ML IJ SOLN
INTRAMUSCULAR | Status: DC | PRN
  Administered 2012-10-17: 4 mg via INTRAVENOUS

## 2012-10-17 MED ORDER — MORPHINE SULFATE 2 MG/ML IJ SOLN: 1.0000 mg | INTRAMUSCULAR | Status: DC | PRN

## 2012-10-17 MED ORDER — PIPERACILLIN-TAZOBACTAM 3.375 G IVPB 30 MIN
3.3750 g | Freq: Once | INTRAVENOUS | Status: AC
  Administered 2012-10-17: 3.375 g via INTRAVENOUS
  Filled 2012-10-17: qty 50

## 2012-10-17 MED ORDER — MORPHINE SULFATE (PF) 1 MG/ML IV SOLN
INTRAVENOUS | Status: DC
  Administered 2012-10-17: 14:00:00 via INTRAVENOUS
  Administered 2012-10-17: 4 mg via INTRAVENOUS
  Administered 2012-10-18: 6 mg via INTRAVENOUS
  Administered 2012-10-18: 8 mg via INTRAVENOUS
  Administered 2012-10-18: 1 mg via INTRAVENOUS
  Administered 2012-10-18: 13:00:00 via INTRAVENOUS
  Administered 2012-10-18: 14 mg via INTRAVENOUS
  Administered 2012-10-18: 6 mg via INTRAVENOUS
  Administered 2012-10-18: 4 mg via INTRAVENOUS
  Administered 2012-10-19: 2 mg via INTRAVENOUS
  Administered 2012-10-19: 5.72 mg via INTRAVENOUS
  Administered 2012-10-19: 10 mg via INTRAVENOUS
  Administered 2012-10-19: 4 mg via INTRAVENOUS
  Administered 2012-10-19: 6 mg via INTRAVENOUS
  Administered 2012-10-19: 6.6 mg via INTRAVENOUS
  Administered 2012-10-20: 02:00:00 via INTRAVENOUS
  Administered 2012-10-20: 4 mg via INTRAVENOUS
  Filled 2012-10-17 (×4): qty 25

## 2012-10-17 MED ORDER — OXYCODONE HCL 5 MG/5ML PO SOLN: 5.0000 mg | Freq: Once | ORAL | Status: DC | PRN

## 2012-10-17 MED ORDER — MORPHINE SULFATE (PF) 1 MG/ML IV SOLN
INTRAVENOUS | Status: AC
  Administered 2012-10-17: 1 mg via INTRAVENOUS
  Filled 2012-10-17: qty 25

## 2012-10-17 MED ORDER — ONDANSETRON HCL 4 MG/2ML IJ SOLN
4.0000 mg | Freq: Once | INTRAMUSCULAR | Status: AC
  Administered 2012-10-17: 4 mg via INTRAVENOUS
  Filled 2012-10-17: qty 2

## 2012-10-17 MED ORDER — PANTOPRAZOLE SODIUM 40 MG IV SOLR
40.0000 mg | Freq: Every day | INTRAVENOUS | Status: DC
  Administered 2012-10-17 – 2012-10-19 (×3): 40 mg via INTRAVENOUS
  Filled 2012-10-17 (×6): qty 40

## 2012-10-17 MED ORDER — IOHEXOL 300 MG/ML  SOLN
25.0000 mL | INTRAMUSCULAR | Status: DC
Start: 2012-10-17 — End: 2012-10-17
  Administered 2012-10-17: 25 mL via ORAL

## 2012-10-17 MED ORDER — NALOXONE HCL 0.4 MG/ML IJ SOLN
0.4000 mg | INTRAMUSCULAR | Status: DC | PRN
  Filled 2012-10-17: qty 1

## 2012-10-17 MED ORDER — HYDROMORPHONE HCL PF 1 MG/ML IJ SOLN
INTRAMUSCULAR | Status: AC
  Administered 2012-10-17: 0.25 mg via INTRAVENOUS
  Filled 2012-10-17: qty 1

## 2012-10-17 MED ORDER — MORPHINE SULFATE 4 MG/ML IJ SOLN
4.0000 mg | Freq: Once | INTRAMUSCULAR | Status: AC
  Administered 2012-10-17: 4 mg via INTRAVENOUS
  Filled 2012-10-17: qty 1

## 2012-10-17 MED ORDER — POVIDONE-IODINE 10 % EX OINT
TOPICAL_OINTMENT | CUTANEOUS | Status: AC
  Filled 2012-10-17: qty 28.35

## 2012-10-17 MED ORDER — PHENYLEPHRINE HCL 10 MG/ML IJ SOLN
INTRAMUSCULAR | Status: DC | PRN
  Administered 2012-10-17: 80 ug via INTRAVENOUS

## 2012-10-17 MED ORDER — HYDROMORPHONE HCL PF 1 MG/ML IJ SOLN
1.0000 mg | Freq: Once | INTRAMUSCULAR | Status: AC
  Administered 2012-10-17: 1 mg via INTRAVENOUS
  Filled 2012-10-17: qty 1

## 2012-10-17 MED ORDER — LACTATED RINGERS IV SOLN
INTRAVENOUS | Status: DC | PRN
  Administered 2012-10-17 (×2): via INTRAVENOUS

## 2012-10-17 MED ORDER — PROPOFOL 10 MG/ML IV BOLUS
INTRAVENOUS | Status: DC | PRN
  Administered 2012-10-17: 150 mg via INTRAVENOUS

## 2012-10-17 MED ORDER — ONDANSETRON HCL 4 MG/2ML IJ SOLN: 4.0000 mg | Freq: Four times a day (QID) | INTRAMUSCULAR | Status: DC | PRN

## 2012-10-17 MED ORDER — SODIUM CHLORIDE 0.9 % IV SOLN
1000.0000 mL | Freq: Once | INTRAVENOUS | Status: AC
  Administered 2012-10-17: 1000 mL via INTRAVENOUS

## 2012-10-17 MED ORDER — ONDANSETRON HCL 4 MG/2ML IJ SOLN
4.0000 mg | Freq: Four times a day (QID) | INTRAMUSCULAR | Status: DC | PRN
  Administered 2012-10-19 – 2012-10-21 (×2): 4 mg via INTRAVENOUS
  Filled 2012-10-17 (×2): qty 2

## 2012-10-17 MED ORDER — SODIUM CHLORIDE 0.9 % IV SOLN
1.0000 g | INTRAVENOUS | Status: DC
  Administered 2012-10-17 – 2012-10-22 (×6): 1 g via INTRAVENOUS
  Filled 2012-10-17 (×7): qty 1

## 2012-10-17 MED ORDER — SODIUM CHLORIDE 0.9 % IJ SOLN: 9.0000 mL | INTRAMUSCULAR | Status: DC | PRN

## 2012-10-17 MED ORDER — ENOXAPARIN SODIUM 40 MG/0.4ML ~~LOC~~ SOLN
40.0000 mg | SUBCUTANEOUS | Status: DC
  Administered 2012-10-18 – 2012-10-25 (×8): 40 mg via SUBCUTANEOUS
  Filled 2012-10-17 (×9): qty 0.4

## 2012-10-17 MED ORDER — OXYCODONE HCL 5 MG PO TABS: 5.0000 mg | ORAL_TABLET | Freq: Once | ORAL | Status: DC | PRN

## 2012-10-17 MED ORDER — GLYCOPYRROLATE 0.2 MG/ML IJ SOLN
INTRAMUSCULAR | Status: DC | PRN
  Administered 2012-10-17: 0.6 mg via INTRAVENOUS

## 2012-10-17 MED ORDER — FENTANYL CITRATE 0.05 MG/ML IJ SOLN
INTRAMUSCULAR | Status: DC | PRN
  Administered 2012-10-17 (×2): 50 ug via INTRAVENOUS
  Administered 2012-10-17: 150 ug via INTRAVENOUS

## 2012-10-17 SURGICAL SUPPLY — 56 items
BANDAGE GAUZE ELAST BULKY 4 IN (GAUZE/BANDAGES/DRESSINGS) ×2 IMPLANT
BLADE SURG ROTATE 9660 (MISCELLANEOUS) IMPLANT
CANISTER SUCTION 2500CC (MISCELLANEOUS) ×2 IMPLANT
CHLORAPREP W/TINT 26ML (MISCELLANEOUS) ×2 IMPLANT
CLOTH BEACON ORANGE TIMEOUT ST (SAFETY) ×2 IMPLANT
COVER SURGICAL LIGHT HANDLE (MISCELLANEOUS) ×2 IMPLANT
DRAPE LAPAROSCOPIC ABDOMINAL (DRAPES) ×2 IMPLANT
DRAPE UTILITY 15X26 W/TAPE STR (DRAPE) ×4 IMPLANT
DRAPE WARM FLUID 44X44 (DRAPE) ×2 IMPLANT
DRSG PAD ABDOMINAL 8X10 ST (GAUZE/BANDAGES/DRESSINGS) ×2 IMPLANT
ELECT BLADE 6.5 EXT (BLADE) IMPLANT
ELECT CAUTERY BLADE 6.4 (BLADE) ×2 IMPLANT
ELECT REM PT RETURN 9FT ADLT (ELECTROSURGICAL) ×2
ELECTRODE REM PT RTRN 9FT ADLT (ELECTROSURGICAL) ×1 IMPLANT
GLOVE BIO SURGEON STRL SZ 6 (GLOVE) ×2 IMPLANT
GLOVE BIO SURGEON STRL SZ 6.5 (GLOVE) ×2 IMPLANT
GLOVE BIOGEL PI IND STRL 6.5 (GLOVE) ×1 IMPLANT
GLOVE BIOGEL PI IND STRL 7.0 (GLOVE) ×1 IMPLANT
GLOVE BIOGEL PI IND STRL 7.5 (GLOVE) ×1 IMPLANT
GLOVE BIOGEL PI IND STRL 8 (GLOVE) ×1 IMPLANT
GLOVE BIOGEL PI INDICATOR 6.5 (GLOVE) ×1
GLOVE BIOGEL PI INDICATOR 7.0 (GLOVE) ×1
GLOVE BIOGEL PI INDICATOR 7.5 (GLOVE) ×1
GLOVE BIOGEL PI INDICATOR 8 (GLOVE) ×1
GLOVE ECLIPSE 7.0 STRL STRAW (GLOVE) ×2 IMPLANT
GLOVE ECLIPSE 7.5 STRL STRAW (GLOVE) ×4 IMPLANT
GOWN BRE IMP SLV AUR XL STRL (GOWN DISPOSABLE) ×2 IMPLANT
GOWN STRL NON-REIN LRG LVL3 (GOWN DISPOSABLE) ×10 IMPLANT
KIT BASIN OR (CUSTOM PROCEDURE TRAY) ×2 IMPLANT
KIT OSTOMY DRAINABLE 2.75 STR (WOUND CARE) ×2 IMPLANT
KIT ROOM TURNOVER OR (KITS) ×2 IMPLANT
LIGASURE IMPACT 36 18CM CVD LR (INSTRUMENTS) ×2 IMPLANT
NS IRRIG 1000ML POUR BTL (IV SOLUTION) ×16 IMPLANT
PACK GENERAL/GYN (CUSTOM PROCEDURE TRAY) ×2 IMPLANT
PAD ARMBOARD 7.5X6 YLW CONV (MISCELLANEOUS) ×2 IMPLANT
RELOAD STAPLER LINE PROX 60 GR (STAPLE) ×1 IMPLANT
SPECIMEN JAR LARGE (MISCELLANEOUS) ×2 IMPLANT
SPONGE GAUZE 4X4 12PLY (GAUZE/BANDAGES/DRESSINGS) ×2 IMPLANT
SPONGE LAP 18X18 X RAY DECT (DISPOSABLE) ×4 IMPLANT
STAPLER CUT CVD 40MM BLUE (STAPLE) ×2 IMPLANT
STAPLER PROXIMATE 75MM BLUE (STAPLE) ×2 IMPLANT
STAPLER RELOAD LINE PROX 60 GR (STAPLE) ×2
STAPLER VISISTAT 35W (STAPLE) ×2 IMPLANT
SUCTION POOLE TIP (SUCTIONS) ×2 IMPLANT
SUT PDS AB 1 TP1 96 (SUTURE) ×4 IMPLANT
SUT PROLENE 2 0 SH 30 (SUTURE) ×2 IMPLANT
SUT SILK 2 0 SH CR/8 (SUTURE) ×2 IMPLANT
SUT SILK 2 0 TIES 10X30 (SUTURE) ×2 IMPLANT
SUT SILK 3 0 SH CR/8 (SUTURE) ×2 IMPLANT
SUT SILK 3 0 TIES 10X30 (SUTURE) ×2 IMPLANT
SUT VIC AB 3-0 SH 18 (SUTURE) ×2 IMPLANT
TAPE CLOTH SURG 4X10 WHT LF (GAUZE/BANDAGES/DRESSINGS) ×2 IMPLANT
TOWEL OR 17X26 10 PK STRL BLUE (TOWEL DISPOSABLE) ×2 IMPLANT
TRAY FOLEY CATH 14FRSI W/METER (CATHETERS) IMPLANT
WATER STERILE IRR 1000ML POUR (IV SOLUTION) IMPLANT
YANKAUER SUCT BULB TIP NO VENT (SUCTIONS) ×2 IMPLANT

## 2012-10-17 NOTE — ED Provider Notes (Signed)
History    CSN: [id number] Arrival date & time 10/17/12  0203  First MD Initiated Contact with Patient 10/17/12 [phone]     Chief Complaint  Patient presents with  . Abdominal Pain   (Consider location/radiation/quality/duration/timing/severity/associated sxs/prior Treatment) HPI  [name] is a 62 yo woman with a history of diverticulitis, small bowel lymphagectiectasis, polypoid mass with ulceration discovered on small bowel camera study approximately 1 year. She is followed by a gastroenterologist in [facility] who has suggested observesation of the mass and did not feel that intervention was indicated at the time. The patient had suffered a GI bleed from the ulceration and this was treated successfully with po iron supplementation.  She has been relatively symptom free over the past year.   However, she has developed worsening constipation over the past couple of weeks. Last BM was about 36 hrs ago. She developed abdominal pain yesterday which, at first was intermittent. It has increased in severity and frequency. The patient had severe, 10/10, diffuse abdominal pain upon presentation to the ED and this was treated with MS and Zofran. Patient notes mild RLQ abdominal pain at this time.   She has some burning dysuria and mild urinary hesitancy. Denies fever. Patient was nauseated yesterday but, this resolved without intervention. She denies bloody or melanotic stools. She has passed some mucous in her stools.   Patient denies fever but says she has had "every so often" night sweats for the past 2 months.  Past Medical History  Diagnosis Date  . Diverticulosis   . Hypertension   . Seasonal allergies   . Bronchitis    Past Surgical History  Procedure Laterality Date  . Upper gastrointestinal endoscopy    . Colonoscopy    . Hernia repair    . Abdominal hysterectomy     No family history on file. History  Substance Use Topics  . Smoking status: Never Smoker   . Smokeless  tobacco: Not on file  . Alcohol Use: Yes     Comment: occasional   OB History   Grav Para Term Preterm Abortions TAB SAB Ect Mult Living                 Review of Systems Gen: As per history of present illness, otherwise negative Eyes: no discharge or drainage, no occular pain or visual changes Nose: no epistaxis or rhinorrhea Mouth: no dental pain, no sore throat Neck: no neck pain Lungs: no SOB, cough, wheezing CV: no chest pain, palpitations, dependent edema or orthopnea Abd: As per history of present illness, otherwise negative GU: no dysuria or gross hematuria MSK: no myalgias or arthralgias Neuro: no headache, no focal neurologic deficits Skin: no rash Psyche: negative.  Allergies  Caffeine  Home Medications   Current Outpatient Rx  Name  Route  Sig  Dispense  Refill  . OVER THE COUNTER MEDICATION   Oral   Take 1 tablet by mouth daily. Allergy medication         . Pediatric Multiple Vit-C-FA (MULTIVITAMIN ANIMAL SHAPES, WITH CA/FA,) WITH C & FA CHEW   Oral   Chew 1 tablet by mouth daily.          BP 128/67  Pulse 96  Temp(Src) 98.1 F (36.7 C) (Oral)  Resp 27  Ht 5' (1.524 m)  Wt 102 lb (46.267 kg)  BMI 19.92 kg/m2  SpO2 95% Physical Exam Gen: well developed and well nourished appearing, ill appear uncomfortable appearing Head: NCAT Eyes: PERL, EOMI  Nose: no epistaixis or rhinorrhea Mouth/throat: mucosa is moist and pink Neck: supple, no stridor Lungs: CTA B, no wheezing, rhonchi or rales, respiratory rate is 28-32 times per minute Heart-rapid and regular, no murmur, extremities appear well perfused, cap refill less than 2 seconds Abd: Abdomen is distended, there is mild and diffuse tenderness but marked tenderness with guarding over the right lower quadrant bowel sounds are present and normal. Back: no ttp, no cva ttp Skin: no rashese, wnl Extremities- Neuro: CN ii-xii grossly intact, no focal deficits Psyche; normal affect,  calm and  cooperative.   ED Course  Procedures (including critical care time)  Results for orders placed during the hospital encounter of 10/17/12 (from the past 24 hour(s))  CBC WITH DIFFERENTIAL     Status: Abnormal   Collection Time    10/17/12  2:24 AM      Result Value Range   WBC 10.8 (*) 4.0 - 10.5 K/uL   RBC 4.44  3.87 - 5.11 MIL/uL   Hemoglobin 14.7  12.0 - 15.0 g/dL   HCT 78.2  95.6 - 21.3 %   MCV 97.5  78.0 - 100.0 fL   MCH 33.1  26.0 - 34.0 pg   MCHC 33.9  30.0 - 36.0 g/dL   RDW 08.6  57.8 - 46.9 %   Platelets 202  150 - 400 K/uL   Neutrophils Relative % 64  43 - 77 %   Neutro Abs 6.8  1.7 - 7.7 K/uL   Lymphocytes Relative 20  12 - 46 %   Lymphs Abs 2.2  0.7 - 4.0 K/uL   Monocytes Relative 15 (*) 3 - 12 %   Monocytes Absolute 1.6 (*) 0.1 - 1.0 K/uL   Eosinophils Relative 1  0 - 5 %   Eosinophils Absolute 0.1  0.0 - 0.7 K/uL   Basophils Relative 0  0 - 1 %   Basophils Absolute 0.0  0.0 - 0.1 K/uL  COMPREHENSIVE METABOLIC PANEL     Status: Abnormal   Collection Time    10/17/12  2:24 AM      Result Value Range   Sodium 139  135 - 145 mEq/L   Potassium 3.8  3.5 - 5.1 mEq/L   Chloride 102  96 - 112 mEq/L   CO2 27  19 - 32 mEq/L   Glucose, Bld 143 (*) 70 - 99 mg/dL   BUN 15  6 - 23 mg/dL   Creatinine, Ser 6.29  0.50 - 1.10 mg/dL   Calcium 9.3  8.4 - 52.8 mg/dL   Total Protein 7.4  6.0 - 8.3 g/dL   Albumin 3.7  3.5 - 5.2 g/dL   AST 19  0 - 37 U/L   ALT 15  0 - 35 U/L   Alkaline Phosphatase 86  39 - 117 U/L   Total Bilirubin 1.0  0.3 - 1.2 mg/dL   GFR calc non Af Amer >90  >90 mL/min   GFR calc Af Amer >90  >90 mL/min  LIPASE, BLOOD     Status: None   Collection Time    10/17/12  2:24 AM      Result Value Range   Lipase 20  11 - 59 U/L     MDM  The patient is septic with a perforated viscous. We are resuscitating with NS boluses.  Treating empirically with Zosyn. Case discussed with Dr. Dixon Boos OR nurse as he was scrubbed in surgery. His PA, Aundra Millet, is now here to  see  and evaluate the patient with plan to transfer to OR.   I have explained the patient's diagnosis to the patient and her husband.   CRITICAL CARE Performed by: Brandt Loosen   Total critical care time: 40  Critical care time was exclusive of separately billable procedures and treating other patients.  Critical care was necessary to treat or prevent imminent or life-threatening deterioration.  Critical care was time spent personally by me on the following activities: development of treatment plan with patient and/or surrogate as well as nursing, discussions with consultants, evaluation of patient's response to treatment, examination of patient, obtaining history from patient or surrogate, ordering and performing treatments and interventions, ordering and review of laboratory studies, ordering and review of radiographic studies, pulse oximetry and re-evaluation of patient's condition.   Brandt Loosen, MD 10/17/12 [phone]

## 2012-10-17 NOTE — Progress Notes (Signed)
Patient has a perforated viscus with peritonitis, free fluid and free air.  Reviewed the CT scan with the radiologist who does not believe that this is diverticular disease.  Appendix not seen.  More than likely this is a small bowel perforation.  May need small bowel resection.  Outside chance she may need colectomy with a colostomy.  Liver lesion more than likely complex cyst, does not appear to be malignancy.  Alyssa Robinson. Gae Bon, MD, FACS [phone] [facility]

## 2012-10-17 NOTE — Anesthesia Postprocedure Evaluation (Signed)
Anesthesia Post Note  Patient: Alyssa Robinson  Procedure(s) Performed: Procedure(s) (LRB): EXPLORATORY LAPAROTOMY,  COLON RESECTION AND COLOSTOMY (N/A)  Anesthesia type: General  Patient location: PACU  Post pain: Pain level controlled and Adequate analgesia  Post assessment: Post-op Vital signs reviewed, Patient's Cardiovascular Status Stable, Respiratory Function Stable, Patent Airway and Pain level controlled  Last Vitals:  Filed Vitals:   10/17/12 1349  BP:   Pulse:   Temp:   Resp: 23    Post vital signs: Reviewed and stable  Level of consciousness: awake, alert  and oriented  Complications: No apparent anesthesia complications

## 2012-10-17 NOTE — Preoperative (Signed)
Beta Blockers   Reason not to administer Beta Blockers:Not Applicable 

## 2012-10-17 NOTE — ED Notes (Signed)
Consent signed by spouse

## 2012-10-17 NOTE — ED Notes (Signed)
Gave report to or holding area

## 2012-10-17 NOTE — Anesthesia Preprocedure Evaluation (Signed)
Anesthesia Evaluation  Patient identified by MRN, date of birth, ID band Patient awake    Reviewed: Allergy & Precautions, H&P , NPO status , Patient's Chart, lab work & pertinent test results  Airway Mallampati: II  Neck ROM: full    Dental   Pulmonary          Cardiovascular hypertension,     Neuro/Psych    GI/Hepatic diverticulosis   Endo/Other    Renal/GU      Musculoskeletal   Abdominal   Peds  Hematology   Anesthesia Other Findings   Reproductive/Obstetrics                           Anesthesia Physical Anesthesia Plan  ASA: II  Anesthesia Plan: General   Post-op Pain Management:    Induction: Intravenous  Airway Management Planned: Oral ETT  Additional Equipment:   Intra-op Plan:   Post-operative Plan: Extubation in OR  Informed Consent: I have reviewed the patients History and Physical, chart, labs and discussed the procedure including the risks, benefits and alternatives for the proposed anesthesia with the patient or authorized representative who has indicated his/her understanding and acceptance.     Plan Discussed with: CRNA, Anesthesiologist and Surgeon  Anesthesia Plan Comments:         Anesthesia Quick Evaluation

## 2012-10-17 NOTE — Progress Notes (Signed)
ANTIBIOTIC CONSULT NOTE - INITIAL  Pharmacy Consult for Zosyn Indication: perforated viscus with peritonitis  Allergies  Allergen Reactions  . Caffeine Other (See Comments)    Heart racing    Patient Measurements: Height: 5' (152.4 cm) Weight: 102 lb (46.267 kg) IBW/kg (Calculated) : 45.5  Vital Signs: Temp: 101.5 F (38.6 C) (06/26 0944) Temp src: Oral (06/26 0944) BP: 142/84 mmHg (06/26 0944) Pulse Rate: 99 (06/26 0944) Intake/Output from previous day:   Intake/Output from this shift:    Labs:  Recent Labs  10/17/12 0224  WBC 10.8*  HGB 14.7  PLT 202  CREATININE 0.68   Estimated Creatinine Clearance: 51.7 ml/min (by C-G formula based on Cr of 0.68). No results found for this basename: VANCOTROUGH, VANCOPEAK, VANCORANDOM, GENTTROUGH, GENTPEAK, GENTRANDOM, TOBRATROUGH, TOBRAPEAK, TOBRARND, AMIKACINPEAK, AMIKACINTROU, AMIKACIN,  in the last 72 hours   Microbiology: No results found for this or any previous visit (from the past 720 hour(s)).  Medical History: Past Medical History  Diagnosis Date  . Diverticulosis   . Hypertension   . Seasonal allergies   . Bronchitis     Medications:  Home: OTC allergy med, chewable MVI  Assessment: 62 y.o. female presents with abd pain. Found to have perforated viscus with peritonitis. Taking pt to OR for exp lap,  possible SBR and/or colostomy. Pharmacy asked to help dose Zosyn. Pt already received 3.375gm IV in ED ~0800.   Goal of Therapy:  Eradication of infection  Plan:  1) Continue Zosyn 3.375gm IV q8h. Each dose over 4 hrs 2) Will f/u microbiological data, pt's clinical condition, renal function  Christoper Fabian, PharmD, BCPS Clinical pharmacist, pager [phone] 10/17/2012,9:58 AM

## 2012-10-17 NOTE — H&P (Signed)
Alyssa Robinson is an 62 y.o. female.   Chief Complaint: RLQ abdominal pain, perforated viscus HPI:  62 y/o female with a history of diverticulosis, small bowel lymphagectiectasis, polypoid mass with ulceration discovered on small bowel camera study approximately 1 year. She is followed by a gastroenterologist in [facility] who was planning on completing a small bowel enteroscopy. The patient had suffered a GI bleed from the ulceration and this was treated successfully with po iron supplementation. She has been relatively symptom free over the past year.   However, she has developed worsening constipation over the past couple of weeks. Last BM was about 36 hrs ago. She saw her PCP who started her on Citrucel which she had a normal BM from.  She developed abdominal pain on 10/16/12 which, at first was intermittent. It has increased in severity and frequency. The patient had severe, 10/10, diffuse abdominal pain upon presentation to the ED and this was treated with MS and Zofran. Her pain is improved and it is now mild in the RLQ.    She also complains of  burning dysuria and tenesmus.  She denies any hematuria, melena or BRBPR.  Patient was nauseated yesterday but, this resolved.  She has passed some mucous in her stools on tuesday. Night sweats on and off over the last few months.     Past Medical History  Diagnosis Date  . Diverticulosis   . Hypertension   . Seasonal allergies   . Bronchitis     Past Surgical History  Procedure Laterality Date  . Upper gastrointestinal endoscopy      2013 -  . Colonoscopy      in 2012 -   . Hernia repair      X2 2-4 y/o inguinal  . Abdominal hysterectomy      62 y/o  . Capsule endoscopy      Family History  Problem Relation Age of Onset  . Esophageal cancer Mother     and ovarian cancer  . Colon cancer Other   . Pancreatic cancer Other   . Heart disease Father    Social History:  reports that she has never smoked. She does not have any  smokeless tobacco history on file. She reports that  drinks alcohol. She reports that she does not use illicit drugs.  Allergies:  Allergies  Allergen Reactions  . Caffeine Other (See Comments)    Heart racing     (Not in a hospital admission)  Results for orders placed during the hospital encounter of 10/17/12 (from the past 48 hour(s))  CBC WITH DIFFERENTIAL     Status: Abnormal   Collection Time    10/17/12  2:24 AM      Result Value Range   WBC 10.8 (*) 4.0 - 10.5 K/uL   RBC 4.44  3.87 - 5.11 MIL/uL   Hemoglobin 14.7  12.0 - 15.0 g/dL   HCT 16.1  09.6 - 04.5 %   MCV 97.5  78.0 - 100.0 fL   MCH 33.1  26.0 - 34.0 pg   MCHC 33.9  30.0 - 36.0 g/dL   RDW 40.9  81.1 - 91.4 %   Platelets 202  150 - 400 K/uL   Neutrophils Relative % 64  43 - 77 %   Neutro Abs 6.8  1.7 - 7.7 K/uL   Lymphocytes Relative 20  12 - 46 %   Lymphs Abs 2.2  0.7 - 4.0 K/uL   Monocytes Relative 15 (*) 3 - 12 %  Monocytes Absolute 1.6 (*) 0.1 - 1.0 K/uL   Eosinophils Relative 1  0 - 5 %   Eosinophils Absolute 0.1  0.0 - 0.7 K/uL   Basophils Relative 0  0 - 1 %   Basophils Absolute 0.0  0.0 - 0.1 K/uL  COMPREHENSIVE METABOLIC PANEL     Status: Abnormal   Collection Time    10/17/12  2:24 AM      Result Value Range   Sodium 139  135 - 145 mEq/L   Potassium 3.8  3.5 - 5.1 mEq/L   Chloride 102  96 - 112 mEq/L   CO2 27  19 - 32 mEq/L   Glucose, Bld 143 (*) 70 - 99 mg/dL   BUN 15  6 - 23 mg/dL   Creatinine, Ser 1.61  0.50 - 1.10 mg/dL   Calcium 9.3  8.4 - 09.6 mg/dL   Total Protein 7.4  6.0 - 8.3 g/dL   Albumin 3.7  3.5 - 5.2 g/dL   AST 19  0 - 37 U/L   ALT 15  0 - 35 U/L   Alkaline Phosphatase 86  39 - 117 U/L   Total Bilirubin 1.0  0.3 - 1.2 mg/dL   GFR calc non Af Amer >90  >90 mL/min   GFR calc Af Amer >90  >90 mL/min   Comment:            The eGFR has been calculated     using the CKD EPI equation.     This calculation has not been     validated in all clinical     situations.      eGFR's persistently     <90 mL/min signify     possible Chronic Kidney Disease.  LIPASE, BLOOD     Status: None   Collection Time    10/17/12  2:24 AM      Result Value Range   Lipase 20  11 - 59 U/L  LACTIC ACID, PLASMA     Status: None   Collection Time    10/17/12  8:18 AM      Result Value Range   Lactic Acid, Venous 2.1  0.5 - 2.2 mmol/L   Ct Abdomen Pelvis W Contrast  10/17/2012   *RADIOLOGY REPORT*  Clinical Data: Severe low abdomen and pelvic pain intermittently, history of diverticulitis, also history of bilateral inguinal hernia repairs  CT ABDOMEN AND PELVIS WITH CONTRAST  Technique:  Multidetector CT imaging of the abdomen and pelvis was performed following the standard protocol during bolus administration of intravenous contrast.  Contrast: 80mL OMNIPAQUE IOHEXOL 300 MG/ML  SOLN  Comparison: Abdomen films of 02/04/2011  Findings: The lung bases are clear.  The liver enhances with a low attenuation structure posteriorly in the right lobe most consistent with complex cyst which persist on delayed images.  However, there is very hepatic fluid present.  No ductal dilatation is noted.  No calcified gallstones are seen, and the gallbladder wall is not significantly thickened.  The pancreas is normal in size and the pancreatic duct is not dilated.  The adrenal glands and spleen are unremarkable.  The stomach is dilated with the oral contrast material with no abnormality noted.  The kidneys enhance with no calculus or mass and on delayed images, the pelvocaliceal systems are unremarkable.  The abdominal aorta is normal in caliber.  No adenopathy is seen.  The urinary bladder is moderately urine distended with no abnormality noted.  Within the right lower quadrant  there are prominent slightly dilated loops of small bowel with mild edema. In addition, there do appear to be small bubbles of free air anteriorly.  This suggests a perforated viscus, M with the dilated small bowel loops in the right  lower quadrant and internal hernia is a consideration.  These findings were discussed with Dr. Lavella Lemons at the 8:04 a.m. on 10/17/2012.  There is feces throughout the colon.  No colonic edema is seen. The appendix is not identified with certainty.  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  IMPRESSION:  1.  Small bubbles of free air indicating perforated viscus.  With somewhat prominent small bowel loops in the right lower quadrant with mild edema, an internal hernia is a definite consideration. 2.  The appendix is not seen with certainty.  Critical Value/emergent results were called by telephone at the time of interpretation on 10/17/2012 at  8:04 a.m. to Dr. Lavella Lemons, who verbally acknowledged these results.   Original Report Authenticated By: Dwyane Dee, M.D.    Review of Systems  Constitutional: Negative for fever and chills.  HENT: Negative for hearing loss, congestion and sore throat.   Eyes: Negative for blurred vision.  Respiratory: Negative for cough, shortness of breath and wheezing.   Cardiovascular: Negative for chest pain and palpitations.  Gastrointestinal: Positive for nausea, abdominal pain and constipation. Negative for heartburn, vomiting, diarrhea, blood in stool and melena.  Genitourinary: Positive for dysuria. Negative for urgency, frequency and hematuria.  Musculoskeletal: Negative for myalgias and joint pain.  Neurological: Positive for dizziness. Negative for headaches.    Blood pressure 137/84, pulse 107, temperature 98.1 F (36.7 C), temperature source Oral, resp. rate 25, height 5' (1.524 m), weight 102 lb (46.267 kg), SpO2 93.00%. Physical Exam  Constitutional: She is oriented to person, place, and time. She appears well-developed and well-nourished. No distress.  HENT:  Head: Normocephalic and atraumatic.  Eyes: Conjunctivae and EOM are normal. Pupils are equal, round, and reactive to light. Right eye exhibits no discharge. Left eye exhibits no  discharge. No scleral icterus.  Neck: Normal range of motion.  Cardiovascular: Normal rate, regular rhythm, normal heart sounds and intact distal pulses.  Exam reveals no gallop and no friction rub.   No murmur heard. Respiratory: Effort normal and breath sounds normal. No respiratory distress. She has no wheezes. She has no rales.  GI: Soft. Bowel sounds are normal. She exhibits no distension, no fluid wave, no ascites and no mass. There is no hepatosplenomegaly. There is tenderness (RLQ). There is guarding (subjective guarding). There is no rigidity, no rebound and negative Murphy's sign. No hernia. Hernia confirmed negative in the ventral area, confirmed negative in the right inguinal area and confirmed negative in the left inguinal area.    Abdominal scars, also has superficial scar from dermatologic procedures in the [facility]   Musculoskeletal: Normal range of motion. She exhibits no edema and no tenderness.  Neurological: She is alert and oriented to person, place, and time.  Skin: Skin is warm and dry. No rash noted. No erythema. No pallor.  Psychiatric: She has a normal mood and affect. Her behavior is normal. Judgment and thought content normal.     Assessment/Plan Perforated Viscus ?small bowel vs colon?, DD:  Diverticulitis, appendicitis, SB mass with perforation RLQ abdominal pain Nausea Dysuria Constipation  Plan: 1.  Admit to [facility] service 2.  Ex Lap with possible bowel resection and/or colostomy 3.  IVF, pain control, antiemetics, antibiotics 4.  SCD's and hold  VTE proph until after surgery 5.  NPO   DORT, Alyssa Robinson 10/17/2012, 9:36 AM

## 2012-10-17 NOTE — ED Notes (Addendum)
PT. REPORTS GENERALIZED ABDOMINAL PAIN WITH NAUSEA ONSET YESTERDAY , ALSO REPORTS HARD MUCOID STOOLS 2 WEEKS AGO , PT. STATED HISTORY OF DIVERTICULOSIS . DENIES FEVER .

## 2012-10-17 NOTE — Transfer of Care (Signed)
Immediate Anesthesia Transfer of Care Note  Patient: Alyssa Robinson  Procedure(s) Performed: Procedure(s): EXPLORATORY LAPAROTOMY,  COLON RESECTION AND COLOSTOMY (N/A)  Patient Location: PACU  Anesthesia Type:General  Level of Consciousness: sedated  Airway & Oxygen Therapy: Patient Spontanous Breathing and Patient connected to nasal cannula oxygen  Post-op Assessment: Report given to PACU RN and Post -op Vital signs reviewed and stable  Post vital signs: Reviewed and stable  Complications: No apparent anesthesia complications

## 2012-10-17 NOTE — ED Notes (Signed)
Pt unable to provide urine sample at this time 

## 2012-10-17 NOTE — Op Note (Signed)
OPERATIVE REPORT  DATE OF OPERATION: 10/17/2012  PATIENT:  Alyssa Robinson  62 y.o. female  PRE-OPERATIVE DIAGNOSIS:  PERFORATED Viscous  POST-OPERATIVE DIAGNOSIS:  perforated viscous  PROCEDURE:  Procedure(s): EXPLORATORY LAPAROTOMY,  SIGMOID COLON SEGMENTAL RESECTION AND COLOSTOMY  SURGEON:  Surgeon(s): Cherylynn Ridges, MD  ASSISTANT: Dort, PA-C  ANESTHESIA:   general  EBL: <100 ml  BLOOD ADMINISTERED: none  DRAINS: Nasogastric Tube, Urinary Catheter (Foley) and Colostomy with bag   SPECIMEN:  Source of Specimen:  Sigmoid colon  COUNTS CORRECT:  YES  PROCEDURE DETAILS: The patient was taken to the operating room and placed on the table in the supine position. After an adequate general endotracheal anesthetic was administered she was prepped and draped in the usual sterile manner.  The patient was brought to the operating room for free fluid in the belly, free air, and peritonitis. After proper time out was performed identifying the patient and the procedure to be performed, a midline incision was made from just above the umbilicus down to below the umbilicus down to the pubic crest. It was taken down to and through the midline fascia using electrocautery. Once we were in the peritoneal cavity we saw a large amount of feculent fluid coming out from the right lower quadrant area.  As we explored later we found does a large perforation on the anterior surface of the sigmoid colon leading to significant cecal contamination in the pelvis and especially in the right lower quadrant. Large clumps of feculent material were removed from this area. We subsequently came across the proximal sigmoid colon using a GIA-75 stapler. We subsequently came across the distal part of the colon using a contour device which unfortunately Ms. Fired. We subsequently controlled the open distal colon using Allis clamps and he came across it using a TA 60 stapler with green appliance.  Once the distal end of  the colon was stapled off we marked it with a long stitch of 2-0 Prolene. We then spent several minutes irrigating with approximately 5-6 L of warm saline solution. There was debris up into the upper portion of the liver on the right side in the left upper quadrant. The patient's ovaries and fallopian tubes were both present.  Once we finished irrigation we brought out the in of the proximal sigmoid colon there incision the left lower to mid abdominal wall. It was subsequently matured until colostomy using 3-0 Vicryl pop-off sutures.  Once we did irrigated and brought out the stapled end of the colon which was subsequently matured we changed our gloves and then closed the fascia using running looped #1 PDS suture. Once this was done we left the skin open and the subcutaneous tissue irrigating with saline and subsequently packing it with wet-to-dry Kerlix gauze and 4 x 4 gauze. An appliance was placed on the matured colostomy. All needle counts sponge counts and instrument counts were correct. There was no evidence of significant bleeding and no blood was given to  PATIENT DISPOSITION:  PACU - hemodynamically stable.   Cherylynn Ridges 6/26/20141:11 PM

## 2012-10-17 NOTE — ED Notes (Signed)
Dr. Manly at bedside for assessment 

## 2012-10-17 NOTE — Progress Notes (Signed)
RT called to room to intubate pt due to unresponsiveness.  Sternal rub tried with no response.  Pt began to sit up just before sedations was given but went back to unresponsive.  Pt was intubated by MD at bedside and take to CT and XRAY.. Tube placement confirmed and sedation ordered to help maintain pt while on vent.

## 2012-10-17 NOTE — ED Notes (Signed)
PT c/o severe abdominal pain, intermittent cold sweats. Hx of diverticulitis. Pt started taking citracel Monday (Per suggestion of her PCP). Symptoms began Tuesday.

## 2012-10-18 ENCOUNTER — Encounter (HOSPITAL_COMMUNITY): Payer: Self-pay | Admitting: General Surgery

## 2012-10-18 LAB — COMPREHENSIVE METABOLIC PANEL
ALT: 22 U/L (ref 0–35)
Alkaline Phosphatase: 54 U/L (ref 39–117)
BUN: 18 mg/dL (ref 6–23)
CO2: 23 mEq/L (ref 19–32)
Chloride: 114 mEq/L — ABNORMAL HIGH (ref 96–112)
GFR calc Af Amer: 86 mL/min — ABNORMAL LOW (ref >90)
GFR calc non Af Amer: 75 mL/min — ABNORMAL LOW (ref >90)
Glucose, Bld: 102 mg/dL — ABNORMAL HIGH (ref 70–99)
Potassium: 5.7 mEq/L — ABNORMAL HIGH (ref 3.5–5.1)
Sodium: 144 mEq/L (ref 135–145)
Total Bilirubin: 0.5 mg/dL (ref 0.3–1.2)
Total Protein: 5.2 g/dL — ABNORMAL LOW (ref 6.0–8.3)

## 2012-10-18 LAB — CBC
HCT: 43.2 % (ref 36.0–46.0)
MCHC: 32.9 g/dL (ref 30.0–36.0)
Platelets: 177 10*3/uL (ref 150–400)
RDW: 14.3 % (ref 11.5–15.5)
WBC: 12.2 10*3/uL — ABNORMAL HIGH (ref 4.0–10.5)

## 2012-10-18 MED ORDER — DEXTROSE-NACL 5-0.45 % IV SOLN
INTRAVENOUS | Status: DC
  Administered 2012-10-18 – 2012-10-21 (×7): via INTRAVENOUS

## 2012-10-18 NOTE — Progress Notes (Signed)
dsg cchange done to mid abd incision site. Minimum to scant serosanguineous drainage noted. New wet to dry dsg applied. Pt tolerated dsg change with no problems. Vwilliams,rn.

## 2012-10-18 NOTE — Progress Notes (Signed)
[facility] Progress Note 1 Day Post-Op  Subjective: Patient is very sore.  Has not been out of bed.  Foley to be discontinued today.  Objective: Vital signs in last 24 hours: Temp:  [97.7 F (36.5 C)-101.5 F (38.6 C)] 97.9 F (36.6 C) (06/27 0546) Pulse Rate:  [78-99] 93 (06/27 0546) Resp:  [10-28] 20 (06/27 0757) BP: (91-142)/(57-84) 116/63 mmHg (06/27 0546) SpO2:  [93 %-100 %] 99 % (06/27 0757)    Intake/Output from previous day: 06/26 0701 - 06/27 0700 In: 2690 [I.V.:2690] Out: 1050 [Urine:1050] Intake/Output this shift:    General: Talkative, seems uncomfortable.  Lungs: Clear  Abd: No bowel sounds.  Ostomy is pink and viable.  ;Minimal output  Extremities: No clinical signs or symptoms of DVT  Neuro: Intact  Lab Results:  @LABLAST2 (wbc:2,hgb:2,hct:2,plt:2) BMET  Recent Labs  10/17/12 0224 10/17/12 1711 10/18/12 0405  NA 139  --  144  K 3.8  --  5.7*  CL 102  --  114*  CO2 27  --  23  GLUCOSE 143*  --  102*  BUN 15  --  18  CREATININE 0.68 0.69 0.82  CALCIUM 9.3  --  7.8*   PT/INR  Recent Labs  10/17/12 0908  LABPROT 14.9  INR 1.20   ABG No results found for this basename: PHART, PCO2, PO2, HCO3,  in the last 72 hours  Studies/Results: Ct Abdomen Pelvis W Contrast  10/17/2012   *RADIOLOGY REPORT*  Clinical Data: Severe low abdomen and pelvic pain intermittently, history of diverticulitis, also history of bilateral inguinal hernia repairs  CT ABDOMEN AND PELVIS WITH CONTRAST  Technique:  Multidetector CT imaging of the abdomen and pelvis was performed following the standard protocol during bolus administration of intravenous contrast.  Contrast: 80mL OMNIPAQUE IOHEXOL 300 MG/ML  SOLN  Comparison: Abdomen films of 02/04/2011  Findings: The lung bases are clear.  The liver enhances with a low attenuation structure posteriorly in the right lobe most consistent with complex cyst which persist on delayed images.  However, there is very hepatic fluid  present.  No ductal dilatation is noted.  No calcified gallstones are seen, and the gallbladder wall is not significantly thickened.  The pancreas is normal in size and the pancreatic duct is not dilated.  The adrenal glands and spleen are unremarkable.  The stomach is dilated with the oral contrast material with no abnormality noted.  The kidneys enhance with no calculus or mass and on delayed images, the pelvocaliceal systems are unremarkable.  The abdominal aorta is normal in caliber.  No adenopathy is seen.  The urinary bladder is moderately urine distended with no abnormality noted.  Within the right lower quadrant there are prominent slightly dilated loops of small bowel with mild edema. In addition, there do appear to be small bubbles of free air anteriorly.  This suggests a perforated viscus, M with the dilated small bowel loops in the right lower quadrant and internal hernia is a consideration.  These findings were discussed with Dr. Lavella Lemons at the 8:04 a.m. on 10/17/2012.  There is feces throughout the colon.  No colonic edema is seen. The appendix is not identified with certainty.  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  IMPRESSION:  1.  Small bubbles of free air indicating perforated viscus.  With somewhat prominent small bowel loops in the right lower quadrant with mild edema, an internal hernia is a definite consideration. 2.  The appendix is not seen with certainty.  Critical  Value/emergent results were called by telephone at the time of interpretation on 10/17/2012 at  8:04 a.m. to Dr. Lavella Lemons, who verbally acknowledged these results.   Original Report Authenticated By: Dwyane Dee, M.D.    Anti-infectives: Anti-infectives   Start     Dose/Rate Route Frequency Ordered Stop   10/17/12 1800  ertapenem (INVANZ) 1 g in sodium chloride 0.9 % 50 mL IVPB     1 g 100 mL/hr over 30 Minutes Intravenous Every 24 hours 10/17/12 1647     10/17/12 0930  piperacillin-tazobactam (ZOSYN)  IVPB 3.375 g  Status:  Discontinued     3.375 g 12.5 mL/hr over 240 Minutes Intravenous 3 times per day 10/17/12 0921 10/17/12 1647   10/17/12 0815  piperacillin-tazobactam (ZOSYN) IVPB 3.375 g     3.375 g 100 mL/hr over 30 Minutes Intravenous  Once 10/17/12 0807 10/17/12 0857      Assessment/Plan: s/p Procedure(s): EXPLORATORY LAPAROTOMY,  COLON RESECTION AND COLOSTOMY d/c foley Remove NGT Hyperkalemia to be treated by changing the IV fluids.  LOS: 1 day   Marta Lamas. Gae Bon, MD, FACS [phone] [facility] Surgery 10/18/2012

## 2012-10-19 LAB — BASIC METABOLIC PANEL
Chloride: 107 mEq/L (ref 96–112)
GFR calc Af Amer: >90 mL/min (ref >90)
GFR calc non Af Amer: >90 mL/min (ref >90)
Glucose, Bld: 138 mg/dL — ABNORMAL HIGH (ref 70–99)
Potassium: 4.1 mEq/L (ref 3.5–5.1)
Sodium: 137 mEq/L (ref 135–145)

## 2012-10-19 LAB — CBC WITH DIFFERENTIAL/PLATELET
Basophils Relative: 0 % (ref 0–1)
Eosinophils Absolute: 0 10*3/uL (ref 0.0–0.7)
Lymphs Abs: 0.5 10*3/uL — ABNORMAL LOW (ref 0.7–4.0)
MCH: 33.2 pg (ref 26.0–34.0)
Neutro Abs: 10.1 10*3/uL — ABNORMAL HIGH (ref 1.7–7.7)
Neutrophils Relative %: 90 % — ABNORMAL HIGH (ref 43–77)
Platelets: 163 10*3/uL (ref 150–400)
RBC: 3.7 MIL/uL — ABNORMAL LOW (ref 3.87–5.11)

## 2012-10-19 NOTE — Progress Notes (Signed)
2 Days Post-Op  Subjective: 62 yo white female laying up in bed in NAD. Husband at bedside. States midline incisional pain is well controlled with Morphine administered approx every 4 hours. Pain 5/10 w/o pain med. Was unable to get out of bed yesterday due to pain.  Would like to try walking with assistance today. Tolerated foley catheter and NG tube removal yesterday. Urinating in bed pan without difficulty and minimal nausea. Does not report any BM or having flatus.   C/o swollen lower extremities, left foot > right foot. Denies any SOB, chest pain, numbness, tingling, or paralysis of lower extremities.  Minimal use of IS, able to get to 500 today. Patient's husband states she has been "edgy" more so in the last day.  Objective: Vital signs in last 24 hours: Temp:  [97.7 F (36.5 C)-99.2 F (37.3 C)] 99 F (37.2 C) (06/28 0453) Pulse Rate:  [93-101] 94 (06/28 0453) Resp:  [16-26] 20 (06/28 0748) BP: (116-133)/(45-75) 130/73 mmHg (06/28 0453) SpO2:  [93 %-98 %] 96 % (06/28 0748) Last BM Date:  (PTA)  Intake/Output from previous day: 06/27 0701 - 06/28 0700 In: 677.9 [I.V.:677.9] Out: 1075 [Urine:1075] Intake/Output this shift:    PE: Gen:  Alert, NAD, pleasant, mildly diaphoretic (she claims is not unusual for her) Card:  RRR, no M/G/R heard Pulm:  CTA, no W/R/R Abd: Soft, NT/ND, min BS, no HSM, incisions C/D/I, stoma with minimal sanguinous drainage, midline abdominal scar with good granulation Lower Ext:  Bilateral swelling of feet, Negative bilateral Homan's sign, 2+ right pedal pulse, 1+ left pedal pulse, No palpable cords bilaterally  Lab Results:   Recent Labs  10/18/12 0405 10/19/12 0450  WBC 12.2* 11.2*  HGB 14.2 12.3  HCT 43.2 37.1  PLT 177 163   BMET  Recent Labs  10/18/12 0405 10/19/12 0450  NA 144 137  K 5.7* 4.1  CL 114* 107  CO2 23 24  GLUCOSE 102* 138*  BUN 18 14  CREATININE 0.82 0.62  CALCIUM 7.8* 8.2*   PT/INR  Recent Labs   10/17/12 0908  LABPROT 14.9  INR 1.20   CMP     Component Value Date/Time   NA 137 10/19/2012 0450   K 4.1 10/19/2012 0450   CL 107 10/19/2012 0450   CO2 24 10/19/2012 0450   GLUCOSE 138* 10/19/2012 0450   BUN 14 10/19/2012 0450   CREATININE 0.62 10/19/2012 0450   CALCIUM 8.2* 10/19/2012 0450   PROT 5.2* 10/18/2012 0405   ALBUMIN 2.2* 10/18/2012 0405   AST 31 10/18/2012 0405   ALT 22 10/18/2012 0405   ALKPHOS 54 10/18/2012 0405   BILITOT 0.5 10/18/2012 0405   GFRNONAA >90 10/19/2012 0450   GFRAA >90 10/19/2012 0450   Lipase     Component Value Date/Time   LIPASE 20 10/17/2012 0224       Studies/Results: No results found.  Anti-infectives: Anti-infectives   Start     Dose/Rate Route Frequency Ordered Stop   10/17/12 1800  ertapenem (INVANZ) 1 g in sodium chloride 0.9 % 50 mL IVPB     1 g 100 mL/hr over 30 Minutes Intravenous Every 24 hours 10/17/12 1647     10/17/12 0930  piperacillin-tazobactam (ZOSYN) IVPB 3.375 g  Status:  Discontinued     3.375 g 12.5 mL/hr over 240 Minutes Intravenous 3 times per day 10/17/12 0921 10/17/12 1647   10/17/12 0815  piperacillin-tazobactam (ZOSYN) IVPB 3.375 g     3.375 g 100 mL/hr over 30  Minutes Intravenous  Once 10/17/12 0807 10/17/12 0857       Assessment/Plan  s/p Procedure(s): EXPLORATORY LAPAROTOMY, COLON RESECTION AND COLOSTOMY POD #2  - Pain control, antiemetics  - IS every hour  - SCD, Lovenox, ambulate   - Continue NPO  Hyperkalemia Normalized 4.1 Leukocytosis Improving 12.2 to 11.2   LOS: 2 days    Cephus Shelling, PA-S 10/19/2012, 8:07 AM

## 2012-10-19 NOTE — Progress Notes (Signed)
Pt alert x4 v/s stable. C/o bil leg swelling. Slight  swelling noted to bil toes. Legs elevated on pillows will monitor.

## 2012-10-19 NOTE — Progress Notes (Signed)
General surgery attending note:  I have personally interviewed and examined this patient today. I have discussed her treatment plan with her and her family members. I agree with the assessment and treatment plan outlined by Ms. Dorthula Nettles.  She is stable but still has an ileus and really no bowel function yet. We'll continue ice chips and water until there was a little more evidence of bowel function.  She needs to mobilize out of bed and use the incentive spirometer more. Continue Lovenox for DVT prophylaxis Continue Invanz  Check pathology  Angelia Mould. Derrell Lolling, M.D., [facility] General and Minimally invasive Surgery Breast and Colorectal Surgery Office:   [phone] Pager:   [phone]

## 2012-10-20 MED ORDER — PANTOPRAZOLE SODIUM 40 MG IV SOLR
40.0000 mg | Freq: Two times a day (BID) | INTRAVENOUS | Status: DC
  Administered 2012-10-20 – 2012-10-22 (×5): 40 mg via INTRAVENOUS
  Filled 2012-10-20 (×5): qty 40

## 2012-10-20 MED ORDER — HYDROCODONE-ACETAMINOPHEN 5-325 MG PO TABS
1.0000 | ORAL_TABLET | ORAL | Status: DC | PRN
  Administered 2012-10-22: 1 via ORAL
  Administered 2012-10-22 – 2012-10-23 (×2): 2 via ORAL
  Administered 2012-10-23 (×2): 1 via ORAL
  Administered 2012-10-23: 2 via ORAL
  Administered 2012-10-23: 1 via ORAL
  Administered 2012-10-24 (×2): 2 via ORAL
  Administered 2012-10-24: 1 via ORAL
  Administered 2012-10-25: 2 via ORAL
  Filled 2012-10-20: qty 1
  Filled 2012-10-20: qty 2
  Filled 2012-10-20 (×2): qty 1
  Filled 2012-10-20 (×7): qty 2

## 2012-10-20 MED ORDER — MORPHINE SULFATE 2 MG/ML IJ SOLN
1.0000 mg | INTRAMUSCULAR | Status: DC | PRN
  Administered 2012-10-20 – 2012-10-22 (×5): 2 mg via INTRAVENOUS
  Filled 2012-10-20 (×5): qty 1

## 2012-10-20 NOTE — Progress Notes (Signed)
General surgery attending note:  I've interviewed and examined this patient today. I agree with the assessment and treatment plan outlined by Ms. Dort, PA.  Focus for today will be ambulation and inc. spirometry. Clear liquids as tolerated.   Alyssa Robinson. Derrell Lolling, M.D., [facility] General and Minimally invasive Surgery Breast and Colorectal Surgery Office:   [phone] Pager:   [phone]

## 2012-10-20 NOTE — Progress Notes (Signed)
3 Days Post-Op  Subjective: Pt feeling okay today, some tolerable abdominal pain.  No N/V.  Ambulating some.  No ostomy output yet, but some gas.  Tolerating dressing changes.  Hungry.  Low effort on IS (500-750)  Objective: Vital signs in last 24 hours: Temp:  [97.4 F (36.3 C)-98.6 F (37 C)] 97.4 F (36.3 C) (06/29 0542) Pulse Rate:  [78-88] 83 (06/29 0542) Resp:  [14-29] 24 (06/29 0755) BP: (109-134)/(70-76) 128/76 mmHg (06/29 0542) SpO2:  [95 %-99 %] 96 % (06/29 0755) Last BM Date: 10/15/12  Intake/Output from previous day: 06/28 0701 - 06/29 0700 In: 4435.6 [I.V.:4435.6] Out: 2050 [Urine:2050] Intake/Output this shift:    PE: Gen:  Alert, NAD, pleasant Abd: Soft, appropriately tender, ND, +BS, no HSM, wound clean, no significant slough at midline abdominal wound  Lab Results:   Recent Labs  10/18/12 0405 10/19/12 0450  WBC 12.2* 11.2*  HGB 14.2 12.3  HCT 43.2 37.1  PLT 177 163   BMET  Recent Labs  10/18/12 0405 10/19/12 0450  NA 144 137  K 5.7* 4.1  CL 114* 107  CO2 23 24  GLUCOSE 102* 138*  BUN 18 14  CREATININE 0.82 0.62  CALCIUM 7.8* 8.2*   PT/INR  Recent Labs  10/17/12 0908  LABPROT 14.9  INR 1.20   CMP     Component Value Date/Time   NA 137 10/19/2012 0450   K 4.1 10/19/2012 0450   CL 107 10/19/2012 0450   CO2 24 10/19/2012 0450   GLUCOSE 138* 10/19/2012 0450   BUN 14 10/19/2012 0450   CREATININE 0.62 10/19/2012 0450   CALCIUM 8.2* 10/19/2012 0450   PROT 5.2* 10/18/2012 0405   ALBUMIN 2.2* 10/18/2012 0405   AST 31 10/18/2012 0405   ALT 22 10/18/2012 0405   ALKPHOS 54 10/18/2012 0405   BILITOT 0.5 10/18/2012 0405   GFRNONAA >90 10/19/2012 0450   GFRAA >90 10/19/2012 0450   Lipase     Component Value Date/Time   LIPASE 20 10/17/2012 0224       Studies/Results: No results found.  Anti-infectives: Anti-infectives   Start     Dose/Rate Route Frequency Ordered Stop   10/17/12 1800  ertapenem (INVANZ) 1 g in sodium chloride 0.9 % 50  mL IVPB     1 g 100 mL/hr over 30 Minutes Intravenous Every 24 hours 10/17/12 1647     10/17/12 0930  piperacillin-tazobactam (ZOSYN) IVPB 3.375 g  Status:  Discontinued     3.375 g 12.5 mL/hr over 240 Minutes Intravenous 3 times per day 10/17/12 0921 10/17/12 1647   10/17/12 0815  piperacillin-tazobactam (ZOSYN) IVPB 3.375 g     3.375 g 100 mL/hr over 30 Minutes Intravenous  Once 10/17/12 0807 10/17/12 0857       Assessment/Plan POD #3 s/p Procedure(s): EXPLORATORY LAPAROTOMY, COLON RESECTION AND COLOSTOMY - Pain control, antiemetics  - IS every hour - goal 1500 today - SCD, Lovenox, ambulate at least TID - Start clears, await for ostomy output - Continue antibiotics Hyperkalemia Normalized 4.1  Leukocytosis Improving 12.2 to 11.2 -repeat labs tomorrow Gerd/heartburn?  - Protonix to BID    LOS: 3 days    Aris [name] 10/20/2012, 8:33 AM Pager: [phone]

## 2012-10-21 LAB — BASIC METABOLIC PANEL
BUN: 6 mg/dL (ref 6–23)
Chloride: 103 mEq/L (ref 96–112)
Creatinine, Ser: 0.56 mg/dL (ref 0.50–1.10)
GFR calc Af Amer: >90 mL/min (ref >90)

## 2012-10-21 LAB — CBC
HCT: 34.6 % — ABNORMAL LOW (ref 36.0–46.0)
MCH: 32.4 pg (ref 26.0–34.0)
MCV: 97.5 fL (ref 78.0–100.0)
RDW: 14.2 % (ref 11.5–15.5)
WBC: 8.4 10*3/uL (ref 4.0–10.5)

## 2012-10-21 MED ORDER — SUCRALFATE 1 G PO TABS
1.0000 g | ORAL_TABLET | Freq: Three times a day (TID) | ORAL | Status: DC
  Administered 2012-10-21 – 2012-10-25 (×13): 1 g via ORAL
  Filled 2012-10-21 (×23): qty 1

## 2012-10-21 MED ORDER — POTASSIUM CHLORIDE IN NACL 20-0.9 MEQ/L-% IV SOLN
INTRAVENOUS | Status: DC
  Administered 2012-10-21 – 2012-10-22 (×2): via INTRAVENOUS
  Filled 2012-10-21 (×4): qty 1000

## 2012-10-21 NOTE — Consult Note (Signed)
WOC ostomy consult  Stoma type/location: Consult requested for colostomy patient from surgery on 6/26 Stomal assessment/size: Pt feeling poorly.  Declines offer of stoma assessment at this time or educational session.  Requests education be performed at 0900 on Tuesday AM when her husband will be present.  Plan to return at that time. Cammie Mcgee MSN, RN, CWOCN, [place], CNS [phone]

## 2012-10-21 NOTE — Progress Notes (Signed)
4 Days Post-Op  Subjective: 62 yo white female feeling depressed and discouraged. C/o mid abdominal pain mostly at incision site and heart burn same degree as yesterday. Grades pain 5-8/10. Has not been asking for pain medication often. States she feels/hears her stomach 'gurgling'. No BM and denies having flatus.   Is able to mobilize to restroom with assistance to urinate. Walked around 4 times yesterday. Minimal use of IS due to pain. Discussed importance and encouraged use of IS every hour.   Has been tolerating clear liquid diet well. Minimal Nausea, unrelated to food intake. Constant heart burn, Protonix and sitting up seem to help alleviate discomfort.     Objective: Vital signs in last 24 hours: Temp:  [97.8 F (36.6 C)-99.2 F (37.3 C)] 97.8 F (36.6 C) (06/30 0510) Pulse Rate:  [73-97] 97 (06/30 0510) Resp:  [19-20] 19 (06/30 0510) BP: (119-141)/(63-75) 141/72 mmHg (06/30 0510) SpO2:  [95 %-100 %] 100 % (06/30 0510) Last BM Date: 10/15/12  Intake/Output from previous day: 06/29 0701 - 06/30 0700 In: 3405.7 [P.O.:170; I.V.:3035.7; IV Piggyback:200] Out: 2250 [Urine:2250] Intake/Output this shift:    PE: Gen:  Alert, sitting up in bed, NAD, depressed Card:  RRR, no M/G/R heard Pulm:  CTA, no W/R/R Abd: Soft, right abdomen tender to palpation, mild distention,  +BS, no HSM, midline incision with good granulation, Colostomy in place drain with minimal sanguinous drainage Lower Ext:   2+ right pedal pulse, 1+ pedal pulse, Negative Homan's sign bilaterally, No palpable cords bilaterally, No edema bilaterally  Lab Results:   Recent Labs  10/19/12 0450 10/21/12 0605  WBC 11.2* 8.4  HGB 12.3 11.5*  HCT 37.1 34.6*  PLT 163 154   BMET  Recent Labs  10/19/12 0450 10/21/12 0605  NA 137 137  K 4.1 3.4*  CL 107 103  CO2 24 29  GLUCOSE 138* 137*  BUN 14 6  CREATININE 0.62 0.56  CALCIUM 8.2* 7.9*   PT/INR No results found for this basename: LABPROT, INR,  in the  last 72 hours CMP     Component Value Date/Time   NA 137 10/21/2012 0605   K 3.4* 10/21/2012 0605   CL 103 10/21/2012 0605   CO2 29 10/21/2012 0605   GLUCOSE 137* 10/21/2012 0605   BUN 6 10/21/2012 0605   CREATININE 0.56 10/21/2012 0605   CALCIUM 7.9* 10/21/2012 0605   PROT 5.2* 10/18/2012 0405   ALBUMIN 2.2* 10/18/2012 0405   AST 31 10/18/2012 0405   ALT 22 10/18/2012 0405   ALKPHOS 54 10/18/2012 0405   BILITOT 0.5 10/18/2012 0405   GFRNONAA >90 10/21/2012 0605   GFRAA >90 10/21/2012 0605   Lipase     Component Value Date/Time   LIPASE 20 10/17/2012 0224       Studies/Results: No results found.  Anti-infectives: Anti-infectives   Start     Dose/Rate Route Frequency Ordered Stop   10/17/12 1800  ertapenem (INVANZ) 1 g in sodium chloride 0.9 % 50 mL IVPB     1 g 100 mL/hr over 30 Minutes Intravenous Every 24 hours 10/17/12 1647     10/17/12 0930  piperacillin-tazobactam (ZOSYN) IVPB 3.375 g  Status:  Discontinued     3.375 g 12.5 mL/hr over 240 Minutes Intravenous 3 times per day 10/17/12 0921 10/17/12 1647   10/17/12 0815  piperacillin-tazobactam (ZOSYN) IVPB 3.375 g     3.375 g 100 mL/hr over 30 Minutes Intravenous  Once 10/17/12 0807 10/17/12 0857  Assessment/Plan POD #4 s/p Procedure(s): EXPLORATORY LAPAROTOMY, COLON RESECTION AND COLOSTOMY  Pain control, antiemetics   IS every hour - goal 1500 today   SCD, Lovenox, ambulate at least QID    - PT Consult  Advance diet to full liquids, await for ostomy output   Continue antibiotics  Hypokalemia 3.4- IV fluid NS with 20 K  Leukocytosis Normalized Gerd/heartburn  Continue Protonix BID   LOS: 4 days    Cephus Shelling 10/21/2012, 8:05 AM

## 2012-10-21 NOTE — Plan of Care (Signed)
Problem: Food- and Nutrition-Related Knowledge Deficit (NB-1.1) Goal: Nutrition education Formal process to instruct or train a patient/client in a skill or to impart knowledge to help patients/clients voluntarily manage or modify food choices and eating behavior to maintain or improve health. Outcome: Completed/Met Date Met:  10/21/12  Nutrition Education Note  RD consulted for nutrition education regarding Nutrition Management for Colostomy.  RD provided "Colostomy Nutrition Therapy" handout from the [employer] of [employer]. Reviewed home diet with pt and suggested ways to meet nutrition goals over the next several weeks. Explained reasons to follow Colostomy Diet with resume of Regular diet in  4-6 weeks and discussed ways to achieve.  Reviewed best practice for long-term management of diverticulosis and answered pt's additional questions about potential food sensitivities. Encouraged fluid intake.  Provided encouragement and reassurance for pt who has already made significant changes to diet in the past and reports decreased confidence in food tolerance due to current medical condition and family medical hx.  Pt verbalizes understanding of information provided.  Expect good compliance.  Body mass index is 31.04 kg/(m^2). Pt meets criteria for obese based on current BMI.  Current diet order is clear liquids, patient is consuming approximately 25% of meals at this time. Labs and medications reviewed. No further nutrition interventions warranted at this time. RD contact information provided. If additional nutrition issues arise, please re-consult RD.  Loyce Dys, MS RD LDN Clinical Inpatient Dietitian Pager: [phone] Weekend/After hours pager: [phone]

## 2012-10-21 NOTE — Progress Notes (Signed)
C/o heartburn - on Protonix; will add Carafate.  Ambulating Has some gurgling on left side  Awaiting bowel function  Continue full liquids for now.  Wilmon Arms. Corliss Skains, MD, [facility]  General/ Trauma Surgery  10/21/2012 2:03 PM

## 2012-10-22 LAB — URINALYSIS, ROUTINE W REFLEX MICROSCOPIC
Glucose, UA: NEGATIVE mg/dL
Leukocytes, UA: NEGATIVE
Protein, ur: NEGATIVE mg/dL
Specific Gravity, Urine: 1.013 (ref 1.005–1.030)

## 2012-10-22 LAB — BASIC METABOLIC PANEL
CO2: 28 mEq/L (ref 19–32)
Calcium: 8.1 mg/dL — ABNORMAL LOW (ref 8.4–10.5)
Creatinine, Ser: 0.56 mg/dL (ref 0.50–1.10)
Glucose, Bld: 125 mg/dL — ABNORMAL HIGH (ref 70–99)

## 2012-10-22 LAB — URINE MICROSCOPIC-ADD ON

## 2012-10-22 LAB — CBC
MCH: 32.3 pg (ref 26.0–34.0)
MCV: 96.4 fL (ref 78.0–100.0)
Platelets: 203 10*3/uL (ref 150–400)
RDW: 14.2 % (ref 11.5–15.5)
WBC: 7.9 10*3/uL (ref 4.0–10.5)

## 2012-10-22 MED ORDER — PANTOPRAZOLE SODIUM 40 MG PO TBEC
40.0000 mg | DELAYED_RELEASE_TABLET | Freq: Two times a day (BID) | ORAL | Status: DC
  Administered 2012-10-22 – 2012-10-25 (×6): 40 mg via ORAL
  Filled 2012-10-22 (×6): qty 1

## 2012-10-22 NOTE — Consult Note (Signed)
WOC ostomy consult  Stoma type/location: Pt with colostomy surgery performed on 6/26.  Husband at bedside for teaching session.  Both appear motivated and participated in pouch application and asked appropriate questions. Dr Corliss Skains at bedside to assess stoma. Stomal assessment/size: Stoma red and viable, 13/4 inches, above skin level. Peristomal assessment: Intact skin surrounding. Output Small amt brown liquid, large amt flatus.  No stool. Ostomy pouching: 1pc Education provided:  Demonstrated cutting pouch and application.  Husband can perform with minimal assist.  Pt able to open and close velcro to empty.  Discussed pouching routines and ordering supplies.  Educational materials left at bedside.  4 pouches ordered to bedside for staff use.  Placed on [place] discharge program.  Pt plans to have home health assistance according to progress notes. Cammie Mcgee MSN, RN, CWOCN, [place], CNS [phone]

## 2012-10-22 NOTE — Evaluation (Signed)
Physical Therapy Evaluation Patient Details Name: Alyssa Robinson MRN: [id number] DOB: 10-28-1950 Today's Date: 10/22/2012 Time: 0865-7846 PT Time Calculation (min): 18 min  PT Assessment / Plan / Recommendation History of Present Illness  Pt s/p Exp. Lap for perforated bowel, s/p colostomy  Clinical Impression  Pt is s/p exp. Lap for perforated bowel.  On eval, she presented as generally weak and stiff with mild pain.  She should recover well without any follow up PT.    PT Assessment  Patient needs continued PT services    Follow Up Recommendations  No PT follow up;Supervision for mobility/OOB    Does the patient have the potential to tolerate intense rehabilitation      Barriers to Discharge        Equipment Recommendations  None recommended by PT    Recommendations for Other Services     Frequency Min 3X/week    Precautions / Restrictions Restrictions Weight Bearing Restrictions: No   Pertinent Vitals/Pain       Mobility  Bed Mobility Bed Mobility: Not assessed Transfers Transfers: Sit to Stand;Stand to Sit Sit to Stand: 4: Min guard Stand to Sit: 4: Min guard Details for Transfer Assistance: slow and guarded, no assist Ambulation/Gait Ambulation/Gait Assistance: 4: Min guard Ambulation Distance (Feet): 200 Feet Assistive device: Other (Comment) (pushing IV pole) Ambulation/Gait Assistance Details: tentative gait, but steady using pole Gait Pattern: Step-through pattern Gait velocity: slower Stairs: No Wheelchair Mobility Wheelchair Mobility: No    Exercises     PT Diagnosis: Generalized weakness  PT Problem List: Decreased strength;Decreased activity tolerance;Decreased mobility;Pain PT Treatment Interventions: Gait training;Functional mobility training;Therapeutic activities;Patient/family education     PT Goals(Current goals can be found in the care plan section) Acute Rehab PT Goals Patient Stated Goal: back to Independent/ active level PT Goal  Formulation: With patient Time For Goal Achievement: 10/29/12 Potential to Achieve Goals: Good  Visit Information  Last PT Received On: 10/22/12 Assistance Needed: +1 History of Present Illness: Pt s/p Exp. Lap for perforated bowel, s/p colostomy       Prior Functioning  Home Living Family/patient expects to be discharged to:: Private residence Living Arrangements: Spouse/significant other Available Help at Discharge: Family Type of Home: House Home Access: Stairs to enter [employer] of Steps: 5 Entrance Stairs-Rails: Right;Left Home Layout: Two level;Able to live on main level with bedroom/bathroom Home Equipment: None Prior Function Level of Independence: Independent Communication Communication: No difficulties    Cognition  Cognition Arousal/Alertness: Awake/alert Behavior During Therapy: WFL for tasks assessed/performed Overall Cognitive Status: Within Functional Limits for tasks assessed    Extremity/Trunk Assessment Upper Extremity Assessment Upper Extremity Assessment: Overall WFL for tasks assessed Lower Extremity Assessment Lower Extremity Assessment: Overall WFL for tasks assessed;Generalized weakness Cervical / Trunk Assessment Cervical / Trunk Assessment: Normal   Balance Balance Balance Assessed: No  End of Session PT - End of Session Activity Tolerance: Patient tolerated treatment well Patient left: in chair;with call bell/phone within reach Nurse Communication: Mobility status  GP     Zakkary Thibault, Eliseo Gum 10/22/2012, 10:32 AM 10/22/2012  [place] Bing, PT [phone]  (pager)

## 2012-10-22 NOTE — Care Management Note (Signed)
  Page 1 of 1   10/22/2012     1:09:47 PM   CARE MANAGEMENT NOTE 10/22/2012  Patient:  Alyssa Robinson, Alyssa Robinson   Account Number:  [id number]  Date Initiated:  10/22/2012  Documentation initiated by:  Ronny Flurry  Subjective/Objective Assessment:     Action/Plan:   Anticipated DC Date:     Anticipated DC Plan:  HOME W HOME HEALTH SERVICES         Choice offered to / List presented to:          [facility] arranged  HH-1 RN      [facility] agency  [facility]   Status of service:  Completed, signed off Medicare Important Message given?   (If response is "NO", the following Medicare IM given date fields will be blank) Date Medicare IM given:   Date Additional Medicare IM given:    Discharge Disposition:    Per UR Regulation:    If discussed at Long Length of Stay Meetings, dates discussed:    Comments:

## 2012-10-22 NOTE — Progress Notes (Signed)
5 Days Post-Op  Subjective: 62 yo WF sitting up comfortably in bed. Husband at bedside. Had an episode of 'gurgling' abdominal discomfort last night. Was given 2 pills of Vicodin and pain controlled. Currently feels bloated, minimal need for pain meds, pain 4/10 throughout abdomen.   Heart burn has improved, was given Carafate last night after dinner, and vomited approx 2 oz. She relates the episode to consuming too much water with the pill. Will try taking Carafate 30 minutes before meals.  Ambulating well with assistance. IS at 750 today. C/o some dysuria, denies hematuria, flank pain, or fever. Feels that she may be having flatus, but unsure. She denies having any BM.  Objective: Vital signs in last 24 hours: Temp:  [97.9 F (36.6 C)-98.2 F (36.8 C)] 98.2 F (36.8 C) (07/01 0531) Pulse Rate:  [68-84] 69 (07/01 0531) Resp:  [16-18] 16 (07/01 0531) BP: (123-140)/(62-75) 137/68 mmHg (07/01 0531) SpO2:  [94 %-98 %] 98 % (07/01 0531) Last BM Date: 10/15/12  Intake/Output from previous day: 06/30 0701 - 07/01 0700 In: 500 [I.V.:500] Out: 400 [Urine:400] Intake/Output this shift:    PE: Gen:  Alert, NAD, pleasant Card:  RRR, no M/G/R heard Pulm:  CTA, no W/R/R Abd: Soft, NT, minimal distension (improved from yesterday), +BS, no HSM, midline incision with good granulation, ostomy inflated from flatus, one 1cm feces pellet, with minimal sanguinous drainage Ext:  No erythema, edema, or tenderness  Lab Results:   Recent Labs  10/21/12 0605  WBC 8.4  HGB 11.5*  HCT 34.6*  PLT 154   BMET  Recent Labs  10/21/12 0605  NA 137  K 3.4*  CL 103  CO2 29  GLUCOSE 137*  BUN 6  CREATININE 0.56  CALCIUM 7.9*   PT/INR No results found for this basename: LABPROT, INR,  in the last 72 hours CMP     Component Value Date/Time   NA 137 10/21/2012 0605   K 3.4* 10/21/2012 0605   CL 103 10/21/2012 0605   CO2 29 10/21/2012 0605   GLUCOSE 137* 10/21/2012 0605   BUN 6 10/21/2012 0605    CREATININE 0.56 10/21/2012 0605   CALCIUM 7.9* 10/21/2012 0605   PROT 5.2* 10/18/2012 0405   ALBUMIN 2.2* 10/18/2012 0405   AST 31 10/18/2012 0405   ALT 22 10/18/2012 0405   ALKPHOS 54 10/18/2012 0405   BILITOT 0.5 10/18/2012 0405   GFRNONAA >90 10/21/2012 0605   GFRAA >90 10/21/2012 0605   Lipase     Component Value Date/Time   LIPASE 20 10/17/2012 0224       Studies/Results: No results found.  Anti-infectives: Anti-infectives   Start     Dose/Rate Route Frequency Ordered Stop   10/17/12 1800  ertapenem (INVANZ) 1 g in sodium chloride 0.9 % 50 mL IVPB     1 g 100 mL/hr over 30 Minutes Intravenous Every 24 hours 10/17/12 1647     10/17/12 0930  piperacillin-tazobactam (ZOSYN) IVPB 3.375 g  Status:  Discontinued     3.375 g 12.5 mL/hr over 240 Minutes Intravenous 3 times per day 10/17/12 0921 10/17/12 1647   10/17/12 0815  piperacillin-tazobactam (ZOSYN) IVPB 3.375 g     3.375 g 100 mL/hr over 30 Minutes Intravenous  Once 10/17/12 0807 10/17/12 0857       Assessment/Plan POD #5 s/p Procedure(s): EXPLORATORY LAPAROTOMY, COLON RESECTION AND COLOSTOMY   Pain control, antiemetics   IS every hour - goal 1500 today   SCD, Lovenox, ambulate at least QID  Waiting for PT Consult   Continue full liquid diet  Continue antibiotics   Dysuria Obtain UA Hypokalemia Recheck CMET Gerd/heartburn Continue Protonix BID, Carafate 30 min before meals   LOS: 5 days   Cephus Shelling, PA-S 10/22/2012, 7:59 AM

## 2012-10-22 NOTE — Progress Notes (Signed)
Participating in ostomy teaching Some flatus/ small BM in ostomy bag Full liquids Hopefully home in next couple of days.  Wilmon Arms. Corliss Skains, MD, [facility]  General/ Trauma Surgery  10/22/2012 11:56 AM

## 2012-10-23 MED ORDER — POLYETHYLENE GLYCOL 3350 17 G PO PACK
17.0000 g | PACK | Freq: Once | ORAL | Status: AC
  Administered 2012-10-23: 17 g via ORAL
  Filled 2012-10-23: qty 1

## 2012-10-23 NOTE — Progress Notes (Signed)
6 Days Post-Op  Subjective: 62 yo WF laying comfortably in bed. States abdominal pain has improved and well controlled on Vicodin. Morphine seems to make her 'out of it', prefers not to take. Describes abdominal pain as feeling 'bloated'. Grades pain 3/10. Reports a lot of "gurgling" and flatus, but no BM. Appetite and heart burn are much improved, no n/v with Carafate. Dysuria has improved, denies hematuria.   PT evaluation done yesterday, no f/u care needed, but will need supervision with mobility. Patient and husband were trained on proper ostomy care without difficulty yesterday. Patient is anticipating Home Health Services upon discharge per case manager.  IS at 650 today, will continue to use every hour and walk around unit. Patient is optimistic with the progress she has made in the last 24 hours.   Objective: Vital signs in last 24 hours: Temp:  [97.9 F (36.6 C)-98.1 F (36.7 C)] 97.9 F (36.6 C) (07/02 0611) Pulse Rate:  [65-77] 74 (07/02 0611) Resp:  [16-18] 16 (07/02 0611) BP: (129-146)/(65-75) 134/72 mmHg (07/02 0611) SpO2:  [94 %-98 %] 98 % (07/02 0611) Last BM Date: 10/15/12  Intake/Output from previous day: 07/01 0701 - 07/02 0700 In: -  Out: 900 [Urine:900] Intake/Output this shift:    PE: Gen:  Alert, NAD, pleasant Card:  RRR, no M/G/R heard  Pulm:  CTA, no W/R/R Abd: Soft, NT, mildly distended, +BS, no HSM, ostomy with minimal serous drainage (filled with flatus, no bowel), stoma pink and viable, midline abdominal incision with good granulation Ext: edema of bilateral feet, 2+ right pedal pulse, 1+ left pedal pulse, negative Homan's sign bilaterally, No palpable cords bilaterally  Lab Results:   Recent Labs  10/21/12 0605 10/22/12 1045  WBC 8.4 7.9  HGB 11.5* 11.6*  HCT 34.6* 34.6*  PLT 154 203   BMET  Recent Labs  10/21/12 0605 10/22/12 1045  NA 137 137  K 3.4* 3.4*  CL 103 101  CO2 29 28  GLUCOSE 137* 125*  BUN 6 9  CREATININE 0.56 0.56   CALCIUM 7.9* 8.1*   PT/INR No results found for this basename: LABPROT, INR,  in the last 72 hours CMP     Component Value Date/Time   NA 137 10/22/2012 1045   K 3.4* 10/22/2012 1045   CL 101 10/22/2012 1045   CO2 28 10/22/2012 1045   GLUCOSE 125* 10/22/2012 1045   BUN 9 10/22/2012 1045   CREATININE 0.56 10/22/2012 1045   CALCIUM 8.1* 10/22/2012 1045   PROT 5.2* 10/18/2012 0405   ALBUMIN 2.2* 10/18/2012 0405   AST 31 10/18/2012 0405   ALT 22 10/18/2012 0405   ALKPHOS 54 10/18/2012 0405   BILITOT 0.5 10/18/2012 0405   GFRNONAA >90 10/22/2012 1045   GFRAA >90 10/22/2012 1045   Lipase     Component Value Date/Time   LIPASE 20 10/17/2012 0224       Studies/Results: No results found.  Anti-infectives: Anti-infectives   Start     Dose/Rate Route Frequency Ordered Stop   10/17/12 1800  ertapenem (INVANZ) 1 g in sodium chloride 0.9 % 50 mL IVPB     1 g 100 mL/hr over 30 Minutes Intravenous Every 24 hours 10/17/12 1647     10/17/12 0930  piperacillin-tazobactam (ZOSYN) IVPB 3.375 g  Status:  Discontinued     3.375 g 12.5 mL/hr over 240 Minutes Intravenous 3 times per day 10/17/12 0921 10/17/12 1647   10/17/12 0815  piperacillin-tazobactam (ZOSYN) IVPB 3.375 g  3.375 g 100 mL/hr over 30 Minutes Intravenous  Once 10/17/12 0807 10/17/12 0857       Assessment/Plan POD #6 s/p Procedure(s): EXPLORATORY LAPAROTOMY, COLON RESECTION AND COLOSTOMY   Pain control, antiemetics   IS every hour - goal 1000 today   SCD, Lovenox, ambulate at least QID    - No further PT services, will need supervision for mobility, per PT  Continue full liquid diet   Continue antibiotics   Positive Flatus, no stool yet  Dysuria negative UA, will follow  Hypokalemia stable at 3.4, supplementing with IV fluids Gerd/heartburn Improving, continue Protonix BID, Carafate PRN ABL Anemia Stable   LOS: 6 days    Cephus Shelling, PA-S 10/23/2012, 7:53 AM

## 2012-10-23 NOTE — Progress Notes (Signed)
Physical Therapy Treatment Patient Details Name: Alyssa Robinson MRN: [id number] DOB: 15-Feb-1951 Today's Date: 10/23/2012 Time: 1550-1606 PT Time Calculation (min): 16 min  PT Assessment / Plan / Recommendation  PT Comments   Emphasis on negotiating stairs, scanning and altering gait speed during ambulation to maximize safety.  Pt progressing well.  Pt and her husband are mobilizing safely.   Follow Up Recommendations  No PT follow up;Supervision for mobility/OOB     Does the patient have the potential to tolerate intense rehabilitation     Barriers to Discharge        Equipment Recommendations  None recommended by PT    Recommendations for Other Services    Frequency Min 3X/week   Progress towards PT Goals Progress towards PT goals: Progressing toward goals  Plan Current plan remains appropriate    Precautions / Restrictions Restrictions Weight Bearing Restrictions: No   Pertinent Vitals/Pain     Mobility  Bed Mobility Bed Mobility: Not assessed Transfers Transfers: Sit to Stand;Stand to Sit Sit to Stand: 4: Min guard;With upper extremity assist;From chair/3-in-1;From toilet Stand to Sit: 4: Min guard;With upper extremity assist;To chair/3-in-1;To toilet Details for Transfer Assistance: slow and guarded, no assist Ambulation/Gait Ambulation/Gait Assistance: 4: Min guard Ambulation Distance (Feet): 500 Feet Assistive device: None ( ) Ambulation/Gait Assistance Details: guarded gait, stiff with abdominal discomfort.  LE's Ext. rotated.; slightly waddle in nature. Gait Pattern: Step-through pattern Gait velocity: slower General Gait Details: pt was able to appreciably speed up and slow down her cadence without any overt deviation.  Also she scanned her environment in approx 180* arc without deviation. Stairs: Yes Stairs Assistance: 5: Supervision Stair Management Technique: One rail Left;Step to pattern;Forwards Number of Stairs: 5 Wheelchair Mobility Wheelchair  Mobility: No    Exercises     PT Diagnosis:    PT Problem List:   PT Treatment Interventions:     PT Goals (current goals can now be found in the care plan section) Acute Rehab PT Goals Patient Stated Goal: back to Independent/ active level PT Goal Formulation: With patient Time For Goal Achievement: 10/29/12 Potential to Achieve Goals: Good  Visit Information  Last PT Received On: 10/23/12 Assistance Needed: +1 History of Present Illness: Pt s/p Exp. Lap for perforated bowel, s/p colostomy    Subjective Data  Subjective: yeah, it hurts a little more the faster I walk. Patient Stated Goal: back to Independent/ active level   Cognition  Cognition Arousal/Alertness: Awake/alert Behavior During Therapy: WFL for tasks assessed/performed Overall Cognitive Status: Within Functional Limits for tasks assessed    Balance  Balance Balance Assessed: No  End of Session PT - End of Session Activity Tolerance: Patient tolerated treatment well Patient left: in chair;with call bell/phone within reach Nurse Communication: Mobility status   GP     Alyssa Robinson, Alyssa Robinson 10/23/2012, 4:12 PM 10/23/2012  Alyssa Robinson, PT [phone]  (pager)

## 2012-10-23 NOTE — Progress Notes (Signed)
Better spirits Large amount of flatus in bag - no BM Ostomy more pink Wound clean  Home Health for dressing changes/ ostomy care.  Miralax today Advance diet after BM Hopefully home tomorrow.  Alyssa Robinson. Alyssa Skains, MD, [facility]  General/ Trauma Surgery  10/23/2012 8:36 AM

## 2012-10-24 LAB — BASIC METABOLIC PANEL
BUN: 6 mg/dL (ref 6–23)
Chloride: 99 mEq/L (ref 96–112)
Creatinine, Ser: 0.53 mg/dL (ref 0.50–1.10)
GFR calc Af Amer: >90 mL/min (ref >90)
GFR calc non Af Amer: >90 mL/min (ref >90)
Glucose, Bld: 129 mg/dL — ABNORMAL HIGH (ref 70–99)
Potassium: 4.7 mEq/L (ref 3.5–5.1)

## 2012-10-24 LAB — CREATININE, SERUM
Creatinine, Ser: 0.56 mg/dL (ref 0.50–1.10)
GFR calc non Af Amer: >90 mL/min (ref >90)

## 2012-10-24 NOTE — Discharge Summary (Signed)
  Physician Discharge Summary  Patient ID: Alyssa Robinson MRN: [id number] DOB/AGE: Jul 01, 1950 62 y.o.  Admit date: 10/17/2012 Discharge date: 10/25/2012  Admitting Diagnosis: 1.  Perforated Viscous  EXPLORATORY LAPAROTOMY, COLON RESECTION AND COLOSTOMY - 10/17/2012 - [facility]. [facility]  2.  Constipation 3.  Questionable history of polyp  Seen by Dr. Sherryl Manges ([facility]) as PCP and Dr. Inge Rise ([facility]) as GI 4.  GERD  Discharge Diagnosis Patient Active Problem List   Diagnosis Date Noted  . Perforated viscus 10/17/2012  . RLQ abdominal pain 10/17/2012  . Nausea 10/17/2012  . Dysuria 10/17/2012  . Constipation 10/17/2012    Consultants: Pharmacy - Christoper Fabian, PharmD, BCPS  Imaging: No results found.  Procedures Dr. Frederik Schmidt (10/17/12) - EXPLORATORY LAPAROTOMY, SIGMOID COLON SEGMENTAL RESECTION AND COLOSTOMY  Hospital Course:  62 yo WF who presented to [facility] with constipation for a couple of weeks and RLQ abdominal pain for one day.  Workup showed an abdominal CT scan with small bubbles of free air indicating perforated viscus. With somewhat prominent small bowel loops in the right lower quadrant with mild edema.  Patient was admitted and underwent procedure listed above.  Tolerated procedure well and was transferred to the floor.   Diet was advanced as tolerated.  On POD 7, the patient was voiding well, tolerating diet, ambulating well, pain well controlled, vital signs stable, ostomy output,  midline abdominal wound incision with good granulation, and felt stable for discharge home.  Patient will follow up in our office in 2 weeks and knows to call with questions or concerns. Her husband is at the bedside and I have gone over discharge instructions and plans.  Physical Exam: General:  Alert, NAD, pleasant, comfortable Abd:  Soft, ND, mild tenderness.  Left ostomy okay.  Midline wound is clean.  Husband has helped change the dressing and understands the plan.    Medication List    ASK  your doctor about these medications       multivitamin animal shapes (with Ca/FA) WITH C & FA Chew  Chew 1 tablet by mouth daily.     OVER THE COUNTER MEDICATION  Take 1 tablet by mouth daily. Allergy medication       Activity: Driving - May drive in 4 or 5 days, if doing well   Lifting - No lifting > 15 pounds for 3 weeks.  Wound Care: May shower. Try to change dressing twice per day. Pack with saline gauze.   Home health care has been arranged to help with ostomy and wound care. Diet: As tolerated.  Follow up appointment: Call Dr. Dixon Boos office [facility]) at [phone] for an appointment in 7 to 10 days.   Medications and dosages:  Resume your home medications.  You have a prescription for: Vicodin.  Signed: Cephus Shelling PA-S [facility] [phone]  Ovidio Kin, MD, [facility] Pager: [phone] Office phone:  [phone]   10/24/2012, 11:15 AM

## 2012-10-24 NOTE — Consult Note (Addendum)
.  WOC ostomy consult  Stoma type/location: LLQ colostomy Stomal assessment/size: 1 and 3/4 x 1 and 1/4 inch  inch oval stoma Peristomal assessment: intact, clear Treatment options for stomal/peristomal skin: None indicated Output: flatus and small amounts of stool Ostomy pouching: 1pc. Cut-to-fit ostomy pouching system, [place] [phone], [place] [phone] Education provided: Patient alone in room; we called husband to ask if he had any questions and he had none.  Has emptied pouch twice since seen by my partner on Tuesday, 7/1.  Pouching system changed and patient provided with pattern for stoma measurement and three pouches for discharge.  Secure Start Pouches should arrive at her home early next week.  Patient taught pouching skin barrier feature of heat-activated adhesive and also to use a toilet-paper "wick" to clean out the bottom 2-inches of the pouching system's tail closure after emptying.  Reinforced that pouching system is changed twice weekly on Mondays and Thursdays.  Patient assisted to chair for lunch following my visit.  No further questions at this time.  Okay for discharge to home with assistive care from [facility] and husband from [facility] Nurse's standpoint.    Thanks, Ladona Mow, MSN, RN, GNP, [place], CWON-AP [phone])

## 2012-10-24 NOTE — Progress Notes (Signed)
Physical Therapy Treatment Patient Details Name: Alyssa Robinson MRN: [id number] DOB: 1950/11/22 Today's Date: 10/24/2012 Time: 4098-1191 PT Time Calculation (min): 23 min  PT Assessment / Plan / Recommendation  PT Comments   Pt ready for D/C,  Still no need for PT follow up.  Pt and husband will be able to manage well  Follow Up Recommendations  No PT follow up;Supervision for mobility/OOB     Does the patient have the potential to tolerate intense rehabilitation     Barriers to Discharge        Equipment Recommendations  None recommended by PT    Recommendations for Other Services    Frequency Min 3X/week   Progress towards PT Goals Progress towards PT goals: Progressing toward goals  Plan Current plan remains appropriate    Precautions / Restrictions Restrictions Weight Bearing Restrictions: No   Pertinent Vitals/Pain 5/10 lower abdomen pain    Mobility  Bed Mobility Bed Mobility: Not assessed Transfers Transfers: Sit to Stand;Stand to Sit Sit to Stand: With upper extremity assist;From chair/3-in-1;6: Modified independent (Device/Increase time) Stand to Sit: With upper extremity assist;To chair/3-in-1;To toilet;6: Modified independent (Device/Increase time) Details for Transfer Assistance: safe mobility Ambulation/Gait Ambulation/Gait Assistance: 6: Modified independent (Device/Increase time);Other (comment) (in home like environment) Ambulation Distance (Feet): 900 Feet Assistive device: None ( ) Ambulation/Gait Assistance Details: pt moving more effortlessly with steady gait.  She showed ability to speed up gait speed appreciably while scanning her environment without overt deviation. Gait Pattern: Step-through pattern General Gait Details: pt was able to appreciably speed up and slow down her cadence without any overt deviation.  Also she scanned her environment in approx 180* arc without deviation. Stairs: No Wheelchair Mobility Wheelchair Mobility: No     Exercises     PT Diagnosis:    PT Problem List:   PT Treatment Interventions:     PT Goals (current goals can now be found in the care plan section) Acute Rehab PT Goals Patient Stated Goal: back to Independent/ active level PT Goal Formulation: With patient Time For Goal Achievement: 10/29/12 Potential to Achieve Goals: Good  Visit Information  Last PT Received On: 10/24/12 Assistance Needed: +1 History of Present Illness: Pt s/p Exp. Lap for perforated bowel, s/p colostomy    Subjective Data  Subjective: I'm really getting back to feeling more like myself, but I'm still painful across here  (lower abdomen) Patient Stated Goal: back to Independent/ active level   Cognition  Cognition Arousal/Alertness: Awake/alert Behavior During Therapy: WFL for tasks assessed/performed Overall Cognitive Status: Within Functional Limits for tasks assessed    Balance  Balance Balance Assessed: No  End of Session PT - End of Session Activity Tolerance: Patient tolerated treatment well Patient left: in chair;with call bell/phone within reach Nurse Communication: Mobility status   GP     Ripley Bogosian, Eliseo Gum 10/24/2012, 5:02 PM 10/24/2012  [place] Bing, PT [phone]  (pager)

## 2012-10-24 NOTE — Progress Notes (Signed)
7 Days Post-Op  Subjective: 62 you WF sitting up comfortably in bed. Husband at bedside. States she is feeling much better. Minimal abdominal pain, well controlled with pain meds. Pain 3/10. Reports flatus and liquid BM in ostomy bag yesterday. States she is hungry.   Heart burn has improved, doing well with Carafate, denies any nausea. Dysuria has improved. Denies fever, chills, or hematuria. IS up to 1000 this AM. Able to ambulate with assistance.  Objective: Vital signs in last 24 hours: Temp:  [97.3 F (36.3 C)-98.4 F (36.9 C)] 97.3 F (36.3 C) (07/03 0534) Pulse Rate:  [71-75] 73 (07/03 0534) Resp:  [14-18] 18 (07/03 0534) BP: (135-147)/(65-74) 135/65 mmHg (07/03 0534) SpO2:  [96 %-98 %] 97 % (07/03 0534) Last BM Date: 10/15/12  Intake/Output from previous day: 07/02 0701 - 07/03 0700 In: -  Out: 355 [Urine:350; Stool:5] Intake/Output this shift:    PE: Gen:  Alert, NAD, pleasant Card:  RRR, no M/G/R heard Pulm:  CTA, no W/R/R Abd: Soft, NT, minimal distension (improved) +BS, no HSM, midline abdominal incision with good granulation, ostomy with minimal sanguinous drainage, no abdominal scars noted Ext:  No erythema or tenderness, minimal edema bilaterally (improved), right pedal pulse 2+, left pedal pulse +1, Negative Homan's sign bilaterally, No palpable cords bilaterally,   Lab Results:   Recent Labs  10/22/12 1045  WBC 7.9  HGB 11.6*  HCT 34.6*  PLT 203   BMET  Recent Labs  10/22/12 1045  NA 137  K 3.4*  CL 101  CO2 28  GLUCOSE 125*  BUN 9  CREATININE 0.56  CALCIUM 8.1*   PT/INR No results found for this basename: LABPROT, INR,  in the last 72 hours CMP     Component Value Date/Time   NA 137 10/22/2012 1045   K 3.4* 10/22/2012 1045   CL 101 10/22/2012 1045   CO2 28 10/22/2012 1045   GLUCOSE 125* 10/22/2012 1045   BUN 9 10/22/2012 1045   CREATININE 0.56 10/22/2012 1045   CALCIUM 8.1* 10/22/2012 1045   PROT 5.2* 10/18/2012 0405   ALBUMIN 2.2* 10/18/2012 0405    AST 31 10/18/2012 0405   ALT 22 10/18/2012 0405   ALKPHOS 54 10/18/2012 0405   BILITOT 0.5 10/18/2012 0405   GFRNONAA >90 10/22/2012 1045   GFRAA >90 10/22/2012 1045   Lipase     Component Value Date/Time   LIPASE 20 10/17/2012 0224       Studies/Results: No results found.  Anti-infectives: Anti-infectives   Start     Dose/Rate Route Frequency Ordered Stop   10/17/12 1800  ertapenem (INVANZ) 1 g in sodium chloride 0.9 % 50 mL IVPB  Status:  Discontinued     1 g 100 mL/hr over 30 Minutes Intravenous Every 24 hours 10/17/12 1647 10/23/12 0835   10/17/12 0930  piperacillin-tazobactam (ZOSYN) IVPB 3.375 g  Status:  Discontinued     3.375 g 12.5 mL/hr over 240 Minutes Intravenous 3 times per day 10/17/12 0921 10/17/12 1647   10/17/12 0815  piperacillin-tazobactam (ZOSYN) IVPB 3.375 g     3.375 g 100 mL/hr over 30 Minutes Intravenous  Once 10/17/12 0807 10/17/12 0857       Assessment/Plan POD #7 s/p Procedure(s): EXPLORATORY LAPAROTOMY, COLON RESECTION AND COLOSTOMY   Pain control, antiemetics   IS every hour - goal 1250 today   SCD, Lovenox, ambulate at least QID   Positive Flatus and stool  Advance to regular diet   Dysuria negative UA, will follow  Hypokalemia stable at 3.4, recheck BMP Gerd/heartburn Improving, continue Protonix BID, Carafate PRN  ABL Anemia Stable   LOS: 7 days    Cephus Shelling, PA-S 10/24/2012, 7:41 AM   Attending:    Wound granulating Some stool output in ostomy Ostomy education today Home health nursing for ostomy/ dressing changes  Probably home tomorrow  Wilmon Arms. Corliss Skains, MD, [facility]  General/ Trauma Surgery  10/24/2012 9:55 AM

## 2012-10-25 ENCOUNTER — Telehealth (INDEPENDENT_AMBULATORY_CARE_PROVIDER_SITE_OTHER): Payer: Self-pay | Admitting: Surgery

## 2012-10-25 NOTE — Telephone Encounter (Signed)
Pt's husband called noting that her leg swelling has increased since d/c'd home today after emergent colectomy/ostomy for perforated diverticulitis.  Eating fine.  Gas/stool in colostomy.  No pain.  No difficulty breathing/SOB/CP.  No h/o CHF/CKD/need for prior diuretics  I recommended leg elevation and caffeine for a mild diuretic.  He claimed his wife was allergic to it and had a racing heart one time.  No cardiac meds.  Swelling should go down naturally.  Avoid overdrinking as long as having 3-4 voids UOP a day.  If worse swelling, may need short course of diuretics but would prefer input from PCP first.  If severe with difficulty breathing or diffuculty walking, go to the ED.  I discussed the patient's status to the husband.  Questions were answered.  They expressed understanding & appreciation.

## 2012-10-25 NOTE — Progress Notes (Signed)
General Surgery Note  LOS: 8 days  POD -  8 Days Post-Op  Assessment/Plan: 1. EXPLORATORY LAPAROTOMY,  COLON RESECTION AND COLOSTOMY - 10/17/2012 - [facility]  For perforated sigmoid colon  Off antibiotics  2. DVT prophylaxis - Lovenox 3. GERD   [see discharge note]   Subjective:  Ready to go home Objective:   Filed Vitals:   10/25/12 0627  BP: 142/64  Pulse: 76  Temp: 97.7 F (36.5 C)  Resp: 18     Intake/Output from previous day:  07/03 0701 - 07/04 0700 In: 840 [P.O.:840] Out: 900 [Urine:900]  Intake/Output this shift:      Physical Exam:   General: Alert, NAD, pleasant, comfortable    Abd: Soft, ND, mild tenderness. Left ostomy okay. Midline wound is clean. Husband has helped change the dressing and understands the plan.   Lab Results:    Recent Labs  10/22/12 1045  WBC 7.9  HGB 11.6*  HCT 34.6*  PLT 203    BMET   Recent Labs  10/22/12 1045 10/24/12 0600 10/24/12 1005  NA 137  --  136  K 3.4*  --  4.7  CL 101  --  99  CO2 28  --  29  GLUCOSE 125*  --  129*  BUN 9  --  6  CREATININE 0.56 0.56 0.53  CALCIUM 8.1*  --  8.5    PT/INR  No results found for this basename: LABPROT, INR,  in the last 72 hours  ABG  No results found for this basename: PHART, PCO2, PO2, HCO3,  in the last 72 hours   Studies/Results:  No results found.   Anti-infectives:   Anti-infectives   Start     Dose/Rate Route Frequency Ordered Stop   10/17/12 1800  ertapenem (INVANZ) 1 g in sodium chloride 0.9 % 50 mL IVPB  Status:  Discontinued     1 g 100 mL/hr over 30 Minutes Intravenous Every 24 hours 10/17/12 1647 10/23/12 0835   10/17/12 0930  piperacillin-tazobactam (ZOSYN) IVPB 3.375 g  Status:  Discontinued     3.375 g 12.5 mL/hr over 240 Minutes Intravenous 3 times per day 10/17/12 0921 10/17/12 1647   10/17/12 0815  piperacillin-tazobactam (ZOSYN) IVPB 3.375 g     3.375 g 100 mL/hr over 30 Minutes Intravenous  Once 10/17/12 0807 10/17/12 0857      Ovidio Kin, MD, FACS Pager: [phone],   [facility] Office: [phone] 10/25/2012

## 2012-11-05 ENCOUNTER — Telehealth (INDEPENDENT_AMBULATORY_CARE_PROVIDER_SITE_OTHER): Payer: Self-pay

## 2012-11-05 ENCOUNTER — Other Ambulatory Visit (INDEPENDENT_AMBULATORY_CARE_PROVIDER_SITE_OTHER): Payer: Self-pay

## 2012-11-05 DIAGNOSIS — R109 Unspecified abdominal pain: Secondary | ICD-10-CM

## 2012-11-05 MED ORDER — HYDROCODONE-ACETAMINOPHEN 5-325 MG PO TABS: 1.0000 | ORAL_TABLET | Freq: Four times a day (QID) | ORAL | Status: DC | PRN

## 2012-11-05 NOTE — Telephone Encounter (Signed)
Vicodin 5/325mg  # 30 has been phoned  to [facility] per standing order   # [phone] Voice mail to patient @ [phone]

## 2012-11-05 NOTE — Telephone Encounter (Signed)
Patient is asking for refill of Vicodin 5/325mg  prior to dressing changes .[phone]  Please advise pt cell # [phone]

## 2012-11-12 ENCOUNTER — Encounter (INDEPENDENT_AMBULATORY_CARE_PROVIDER_SITE_OTHER): Payer: Self-pay | Admitting: General Surgery

## 2012-11-12 ENCOUNTER — Ambulatory Visit (INDEPENDENT_AMBULATORY_CARE_PROVIDER_SITE_OTHER): Payer: BC Managed Care – PPO | Admitting: General Surgery

## 2012-11-12 VITALS — BP 130/76 | HR 84 | Resp 16 | Ht 61.0 in | Wt 97.2 lb

## 2012-11-12 DIAGNOSIS — IMO0002 Reserved for concepts with insufficient information to code with codable children: Secondary | ICD-10-CM

## 2012-11-12 DIAGNOSIS — L988 Other specified disorders of the skin and subcutaneous tissue: Secondary | ICD-10-CM

## 2012-11-12 DIAGNOSIS — I9789 Other postprocedural complications and disorders of the circulatory system, not elsewhere classified: Secondary | ICD-10-CM | POA: Insufficient documentation

## 2012-11-12 NOTE — Progress Notes (Signed)
The patient comes in today doing well. She's lost a small amount of weight since her surgery. Her midline wound has a small amount of necrotic fascia in the central portion. Otherwise she is doing well. Her colostomy is working well. She's had no fevers or chills. She is otherwise doing very well.  I debrided a small amount of the necrotic wound using scissors and a pickup. A wet-to-dry dressing with saline was subsequently performed. She continued to do but to dry dressings at least once a day. I will have her come back to see me in about 6-7 weeks. Reversal of colostomy will likely occur at the end of the ureter early next year.

## 2012-11-15 ENCOUNTER — Telehealth (INDEPENDENT_AMBULATORY_CARE_PROVIDER_SITE_OTHER): Payer: Self-pay

## 2012-11-15 NOTE — Telephone Encounter (Signed)
Called pt and let her know Dr Lindie Spruce is ok with house getting painted.

## 2012-11-15 NOTE — Telephone Encounter (Signed)
Pt calling to make sure that it's ok for her to have painters coming to her house to paint next Monday and Tuesday. The pt will have windows up as much and possible. The pt just wanted to check with Dr Lindie Spruce since she had surgery on 10/17/12 for colon surgery. Pls advise.

## 2012-11-29 ENCOUNTER — Telehealth (INDEPENDENT_AMBULATORY_CARE_PROVIDER_SITE_OTHER): Payer: Self-pay | Admitting: *Deleted

## 2012-11-29 NOTE — Telephone Encounter (Signed)
Patient called this morning regarding approval of her ostomy supplies.  Patient states she was told by [facility] they had sent over prescription for supplies however Dr. Lindie Spruce had not signed and returned at this time.  This RN was able to find the prescription in Dr. Dixon Boos box however Dr. Lindie Spruce is not back in the office for some time so Dr. Gaynelle Adu did sign the prescription and it was faxed to [facility] at this time.  Confirmation was received that fax was successful.  Patient updated at this time.

## 2012-12-04 NOTE — Telephone Encounter (Signed)
Erroneous encounter

## 2012-12-11 ENCOUNTER — Ambulatory Visit (INDEPENDENT_AMBULATORY_CARE_PROVIDER_SITE_OTHER): Payer: BC Managed Care – PPO | Admitting: Internal Medicine

## 2012-12-11 ENCOUNTER — Other Ambulatory Visit (INDEPENDENT_AMBULATORY_CARE_PROVIDER_SITE_OTHER): Payer: BC Managed Care – PPO

## 2012-12-11 ENCOUNTER — Encounter: Payer: Self-pay | Admitting: Internal Medicine

## 2012-12-11 VITALS — BP 122/92 | HR 72 | Temp 97.4°F | Resp 16 | Ht 61.0 in | Wt 99.1 lb

## 2012-12-11 DIAGNOSIS — R739 Hyperglycemia, unspecified: Secondary | ICD-10-CM | POA: Insufficient documentation

## 2012-12-11 DIAGNOSIS — Z0000 Encounter for general adult medical examination without abnormal findings: Secondary | ICD-10-CM

## 2012-12-11 DIAGNOSIS — I10 Essential (primary) hypertension: Secondary | ICD-10-CM

## 2012-12-11 DIAGNOSIS — R7309 Other abnormal glucose: Secondary | ICD-10-CM

## 2012-12-11 DIAGNOSIS — D649 Anemia, unspecified: Secondary | ICD-10-CM

## 2012-12-11 LAB — CBC WITH DIFFERENTIAL/PLATELET
Basophils Absolute: 0 10*3/uL (ref 0.0–0.1)
Eosinophils Absolute: 0.1 10*3/uL (ref 0.0–0.7)
Lymphocytes Relative: 28.1 % (ref 12.0–46.0)
MCHC: 33.6 g/dL (ref 30.0–36.0)
Monocytes Relative: 12.3 % — ABNORMAL HIGH (ref 3.0–12.0)
Neutrophils Relative %: 56.7 % (ref 43.0–77.0)
RDW: 14.7 % — ABNORMAL HIGH (ref 11.5–14.6)

## 2012-12-11 LAB — COMPREHENSIVE METABOLIC PANEL
ALT: 15 U/L (ref 0–35)
AST: 18 U/L (ref 0–37)
BUN: 27 mg/dL — ABNORMAL HIGH (ref 6–23)
Calcium: 9.7 mg/dL (ref 8.4–10.5)
Chloride: 103 mEq/L (ref 96–112)
Creatinine, Ser: 0.8 mg/dL (ref 0.4–1.2)
Total Bilirubin: 0.4 mg/dL (ref 0.3–1.2)

## 2012-12-11 LAB — LIPID PANEL
Cholesterol: 206 mg/dL — ABNORMAL HIGH (ref 0–200)
HDL: 68.6 mg/dL (ref >39.00)
Total CHOL/HDL Ratio: 3
VLDL: 22.4 mg/dL (ref 0.0–40.0)

## 2012-12-11 LAB — TSH: TSH: 1.36 u[IU]/mL (ref 0.35–5.50)

## 2012-12-11 LAB — FOLATE: Folate: >24.8 ng/mL (ref >5.9)

## 2012-12-11 LAB — VITAMIN B12: Vitamin B-12: 1109 pg/mL — ABNORMAL HIGH (ref 211–911)

## 2012-12-11 LAB — IBC PANEL
Iron: 59 ug/dL (ref 42–145)
Saturation Ratios: 15.6 % — ABNORMAL LOW (ref 20.0–50.0)
Transferrin: 269.6 mg/dL (ref 212.0–360.0)

## 2012-12-11 LAB — FERRITIN: Ferritin: 29.9 ng/mL (ref 10.0–291.0)

## 2012-12-11 NOTE — Assessment & Plan Note (Signed)
I will check her A1C to see if she has developed DM2 

## 2012-12-11 NOTE — Progress Notes (Signed)
  Subjective:    Patient ID: Alyssa Robinson, female    DOB: 04-Mar-1951, 62 y.o.   MRN: [id number]  HPI Comments: New to me, she has no records from her prior MD's in [place].  Anemia Presents for follow-up visit. The condition has lasted for 2 months. There has been no abdominal pain, anorexia, bruising/bleeding easily, confusion, fever, leg swelling, light-headedness, malaise/fatigue, pallor, palpitations, paresthesias, pica or weight loss. Signs of blood loss that are not present include hematemesis, hematochezia, melena and vaginal bleeding. Past treatments include nothing. Procedure history includes colonoscopy and EGD. Compliance problems include psychosocial issues.  Compliance with medications is 0-25%.      Review of Systems  Constitutional: Negative.  Negative for fever, chills, weight loss, malaise/fatigue, diaphoresis, activity change, appetite change, fatigue and unexpected weight change.  HENT: Negative.   Eyes: Negative.   Respiratory: Negative.  Negative for apnea, cough, chest tightness, shortness of breath, wheezing and stridor.   Cardiovascular: Negative.  Negative for chest pain, palpitations and leg swelling.  Gastrointestinal: Negative.  Negative for nausea, vomiting, abdominal pain, diarrhea, constipation, blood in stool, melena, hematochezia, anorexia and hematemesis.  Endocrine: Negative.   Genitourinary: Negative.  Negative for vaginal bleeding.  Musculoskeletal: Negative.   Skin: Negative.  Negative for pallor.  Allergic/Immunologic: Negative.   Neurological: Negative.  Negative for dizziness, tremors, seizures, weakness, light-headedness, numbness and paresthesias.  Hematological: Negative.  Negative for adenopathy. Does not bruise/bleed easily.  Psychiatric/Behavioral: Negative.  Negative for confusion.       Objective:   Physical Exam  Vitals reviewed. Constitutional: She is oriented to person, place, and time. She appears well-developed and  well-nourished. No distress.  HENT:  Head: Normocephalic and atraumatic.  Mouth/Throat: Oropharynx is clear and moist. No oropharyngeal exudate.  Eyes: Conjunctivae are normal. Right eye exhibits no discharge. Left eye exhibits no discharge. No scleral icterus.  Neck: Normal range of motion. Neck supple. No JVD present. No tracheal deviation present. No thyromegaly present.  Cardiovascular: Normal rate, regular rhythm, normal heart sounds and intact distal pulses.  Exam reveals no gallop and no friction rub.   No murmur heard. Pulmonary/Chest: Effort normal and breath sounds normal. No stridor. No respiratory distress. She has no wheezes. She has no rales. She exhibits no tenderness.  Abdominal: Soft. Bowel sounds are normal. She exhibits no distension and no mass. There is no tenderness. There is no rebound and no guarding.  Musculoskeletal: Normal range of motion. She exhibits no edema and no tenderness.  Lymphadenopathy:    She has no cervical adenopathy.  Neurological: She is oriented to person, place, and time.  Skin: Skin is warm and dry. No rash noted. She is not diaphoretic. No erythema. No pallor.  Psychiatric: She has a normal mood and affect. Her behavior is normal. Judgment and thought content normal.     Lab Results  Component Value Date   WBC 7.9 10/22/2012   HGB 11.6* 10/22/2012   HCT 34.6* 10/22/2012   PLT 203 10/22/2012   GLUCOSE 129* 10/24/2012   ALT 22 10/18/2012   AST 31 10/18/2012   NA 136 10/24/2012   K 4.7 10/24/2012   CL 99 10/24/2012   CREATININE 0.53 10/24/2012   BUN 6 10/24/2012   CO2 29 10/24/2012   TSH 3.147 02/05/2011   INR 1.20 10/17/2012       Assessment & Plan:

## 2012-12-11 NOTE — Assessment & Plan Note (Signed)
I will recheck his CBC and will look at her vitamin levels as well

## 2012-12-11 NOTE — Assessment & Plan Note (Signed)
Exam done Vaccines were reviewed She refused a pneumovax today Labs ordered Pt ed material was given

## 2012-12-11 NOTE — Patient Instructions (Signed)
Preventive Care for Adults, Female A healthy lifestyle and preventive care can promote health and wellness. Preventive health guidelines for women include the following key practices.  A routine yearly physical is a good way to check with your caregiver about your health and preventive screening. It is a chance to share any concerns and updates on your health, and to receive a thorough exam.  Visit your dentist for a routine exam and preventive care every 6 months. Brush your teeth twice a day and floss once a day. Good oral hygiene prevents tooth decay and gum disease.  The frequency of eye exams is based on your age, health, family medical history, use of contact lenses, and other factors. Follow your caregiver's recommendations for frequency of eye exams.  Eat a healthy diet. Foods like vegetables, fruits, whole grains, low-fat dairy products, and lean protein foods contain the nutrients you need without too many calories. Decrease your intake of foods high in solid fats, added sugars, and salt. Eat the right amount of calories for you.Get information about a proper diet from your caregiver, if necessary.  Regular physical exercise is one of the most important things you can do for your health. Most adults should get at least 150 minutes of moderate-intensity exercise (any activity that increases your heart rate and causes you to sweat) each week. In addition, most adults need muscle-strengthening exercises on 2 or more days a week.  Maintain a healthy weight. The body mass index (BMI) is a screening tool to identify possible weight problems. It provides an estimate of body fat based on height and weight. Your caregiver can help determine your BMI, and can help you achieve or maintain a healthy weight.For adults 20 years and older:  A BMI below 18.5 is considered underweight.  A BMI of 18.5 to 24.9 is normal.  A BMI of 25 to 29.9 is considered overweight.  A BMI of 30 and above is  considered obese.  Maintain normal blood lipids and cholesterol levels by exercising and minimizing your intake of saturated fat. Eat a balanced diet with plenty of fruit and vegetables. Blood tests for lipids and cholesterol should begin at age 20 and be repeated every 5 years. If your lipid or cholesterol levels are high, you are over 50, or you are at high risk for heart disease, you may need your cholesterol levels checked more frequently.Ongoing high lipid and cholesterol levels should be treated with medicines if diet and exercise are not effective.  If you smoke, find out from your caregiver how to quit. If you do not use tobacco, do not start.  If you are pregnant, do not drink alcohol. If you are breastfeeding, be very cautious about drinking alcohol. If you are not pregnant and choose to drink alcohol, do not exceed 1 drink per day. One drink is considered to be 12 ounces (355 mL) of beer, 5 ounces (148 mL) of wine, or 1.5 ounces (44 mL) of liquor.  Avoid use of street drugs. Do not share needles with anyone. Ask for help if you need support or instructions about stopping the use of drugs.  High blood pressure causes heart disease and increases the risk of stroke. Your blood pressure should be checked at least every 1 to 2 years. Ongoing high blood pressure should be treated with medicines if weight loss and exercise are not effective.  If you are 55 to 63 years old, ask your caregiver if you should take aspirin to prevent strokes.  Diabetes   screening involves taking a blood sample to check your fasting blood sugar level. This should be done once every 3 years, after age 45, if you are within normal weight and without risk factors for diabetes. Testing should be considered at a younger age or be carried out more frequently if you are overweight and have at least 1 risk factor for diabetes.  Breast cancer screening is essential preventive care for women. You should practice "breast  self-awareness." This means understanding the normal appearance and feel of your breasts and may include breast self-examination. Any changes detected, no matter how small, should be reported to a caregiver. Women in their 20s and 30s should have a clinical breast exam (CBE) by a caregiver as part of a regular health exam every 1 to 3 years. After age 40, women should have a CBE every year. Starting at age 40, women should consider having a mammography (breast X-ray test) every year. Women who have a family history of breast cancer should talk to their caregiver about genetic screening. Women at a high risk of breast cancer should talk to their caregivers about having magnetic resonance imaging (MRI) and a mammography every year.  The Pap test is a screening test for cervical cancer. A Pap test can show cell changes on the cervix that might become cervical cancer if left untreated. A Pap test is a procedure in which cells are obtained and examined from the lower end of the uterus (cervix).  Women should have a Pap test starting at age 21.  Between ages 21 and 29, Pap tests should be repeated every 2 years.  Beginning at age 30, you should have a Pap test every 3 years as long as the past 3 Pap tests have been normal.  Some women have medical problems that increase the chance of getting cervical cancer. Talk to your caregiver about these problems. It is especially important to talk to your caregiver if a new problem develops soon after your last Pap test. In these cases, your caregiver may recommend more frequent screening and Pap tests.  The above recommendations are the same for women who have or have not gotten the vaccine for human papillomavirus (HPV).  If you had a hysterectomy for a problem that was not cancer or a condition that could lead to cancer, then you no longer need Pap tests. Even if you no longer need a Pap test, a regular exam is a good idea to make sure no other problems are  starting.  If you are between ages 65 and 70, and you have had normal Pap tests going back 10 years, you no longer need Pap tests. Even if you no longer need a Pap test, a regular exam is a good idea to make sure no other problems are starting.  If you have had past treatment for cervical cancer or a condition that could lead to cancer, you need Pap tests and screening for cancer for at least 20 years after your treatment.  If Pap tests have been discontinued, risk factors (such as a new sexual partner) need to be reassessed to determine if screening should be resumed.  The HPV test is an additional test that may be used for cervical cancer screening. The HPV test looks for the virus that can cause the cell changes on the cervix. The cells collected during the Pap test can be tested for HPV. The HPV test could be used to screen women aged 30 years and older, and should   be used in women of any age who have unclear Pap test results. After the age of 30, women should have HPV testing at the same frequency as a Pap test.  Colorectal cancer can be detected and often prevented. Most routine colorectal cancer screening begins at the age of 50 and continues through age 75. However, your caregiver may recommend screening at an earlier age if you have risk factors for colon cancer. On a yearly basis, your caregiver may provide home test kits to check for hidden blood in the stool. Use of a small camera at the end of a tube, to directly examine the colon (sigmoidoscopy or colonoscopy), can detect the earliest forms of colorectal cancer. Talk to your caregiver about this at age 50, when routine screening begins. Direct examination of the colon should be repeated every 5 to 10 years through age 75, unless early forms of pre-cancerous polyps or small growths are found.  Hepatitis C blood testing is recommended for all people born from 1945 through 1965 and any individual with known risks for hepatitis C.  Practice  safe sex. Use condoms and avoid high-risk sexual practices to reduce the spread of sexually transmitted infections (STIs). STIs include gonorrhea, chlamydia, syphilis, trichomonas, herpes, HPV, and human immunodeficiency virus (HIV). Herpes, HIV, and HPV are viral illnesses that have no cure. They can result in disability, cancer, and death. Sexually active women aged 25 and younger should be checked for chlamydia. Older women with new or multiple partners should also be tested for chlamydia. Testing for other STIs is recommended if you are sexually active and at increased risk.  Osteoporosis is a disease in which the bones lose minerals and strength with aging. This can result in serious bone fractures. The risk of osteoporosis can be identified using a bone density scan. Women ages 65 and over and women at risk for fractures or osteoporosis should discuss screening with their caregivers. Ask your caregiver whether you should take a calcium supplement or vitamin D to reduce the rate of osteoporosis.  Menopause can be associated with physical symptoms and risks. Hormone replacement therapy is available to decrease symptoms and risks. You should talk to your caregiver about whether hormone replacement therapy is right for you.  Use sunscreen with sun protection factor (SPF) of 30 or more. Apply sunscreen liberally and repeatedly throughout the day. You should seek shade when your shadow is shorter than you. Protect yourself by wearing long sleeves, pants, a wide-brimmed hat, and sunglasses year round, whenever you are outdoors.  Once a month, do a whole body skin exam, using a mirror to look at the skin on your back. Notify your caregiver of new moles, moles that have irregular borders, moles that are larger than a pencil eraser, or moles that have changed in shape or color.  Stay current with required immunizations.  Influenza. You need a dose every fall (or winter). The composition of the flu vaccine  changes each year, so being vaccinated once is not enough.  Pneumococcal polysaccharide. You need 1 to 2 doses if you smoke cigarettes or if you have certain chronic medical conditions. You need 1 dose at age 65 (or older) if you have never been vaccinated.  Tetanus, diphtheria, pertussis (Tdap, Td). Get 1 dose of Tdap vaccine if you are younger than age 65, are over 65 and have contact with an infant, are a healthcare worker, are pregnant, or simply want to be protected from whooping cough. After that, you need a Td   booster dose every 10 years. Consult your caregiver if you have not had at least 3 tetanus and diphtheria-containing shots sometime in your life or have a deep or dirty wound.  HPV. You need this vaccine if you are a woman age 26 or younger. The vaccine is given in 3 doses over 6 months.  Measles, mumps, rubella (MMR). You need at least 1 dose of MMR if you were born in 1957 or later. You may also need a second dose.  Meningococcal. If you are age 19 to 21 and a first-year college student living in a residence hall, or have one of several medical conditions, you need to get vaccinated against meningococcal disease. You may also need additional booster doses.  Zoster (shingles). If you are age 60 or older, you should get this vaccine.  Varicella (chickenpox). If you have never had chickenpox or you were vaccinated but received only 1 dose, talk to your caregiver to find out if you need this vaccine.  Hepatitis A. You need this vaccine if you have a specific risk factor for hepatitis A virus infection or you simply wish to be protected from this disease. The vaccine is usually given as 2 doses, 6 to 18 months apart.  Hepatitis B. You need this vaccine if you have a specific risk factor for hepatitis B virus infection or you simply wish to be protected from this disease. The vaccine is given in 3 doses, usually over 6 months. Preventive Services / Frequency Ages 19 to 39  Blood  pressure check.** / Every 1 to 2 years.  Lipid and cholesterol check.** / Every 5 years beginning at age 20.  Clinical breast exam.** / Every 3 years for women in their 20s and 30s.  Pap test.** / Every 2 years from ages 21 through 29. Every 3 years starting at age 30 through age 65 or 70 with a history of 3 consecutive normal Pap tests.  HPV screening.** / Every 3 years from ages 30 through ages 65 to 70 with a history of 3 consecutive normal Pap tests.  Hepatitis C blood test.** / For any individual with known risks for hepatitis C.  Skin self-exam. / Monthly.  Influenza immunization.** / Every year.  Pneumococcal polysaccharide immunization.** / 1 to 2 doses if you smoke cigarettes or if you have certain chronic medical conditions.  Tetanus, diphtheria, pertussis (Tdap, Td) immunization. / A one-time dose of Tdap vaccine. After that, you need a Td booster dose every 10 years.  HPV immunization. / 3 doses over 6 months, if you are 26 and younger.  Measles, mumps, rubella (MMR) immunization. / You need at least 1 dose of MMR if you were born in 1957 or later. You may also need a second dose.  Meningococcal immunization. / 1 dose if you are age 19 to 21 and a first-year college student living in a residence hall, or have one of several medical conditions, you need to get vaccinated against meningococcal disease. You may also need additional booster doses.  Varicella immunization.** / Consult your caregiver.  Hepatitis A immunization.** / Consult your caregiver. 2 doses, 6 to 18 months apart.  Hepatitis B immunization.** / Consult your caregiver. 3 doses usually over 6 months. Ages 40 to 64  Blood pressure check.** / Every 1 to 2 years.  Lipid and cholesterol check.** / Every 5 years beginning at age 20.  Clinical breast exam.** / Every year after age 40.  Mammogram.** / Every year beginning at age 40   and continuing for as long as you are in good health. Consult with your  caregiver.  Pap test.** / Every 3 years starting at age 30 through age 65 or 70 with a history of 3 consecutive normal Pap tests.  HPV screening.** / Every 3 years from ages 30 through ages 65 to 70 with a history of 3 consecutive normal Pap tests.  Fecal occult blood test (FOBT) of stool. / Every year beginning at age 50 and continuing until age 75. You may not need to do this test if you get a colonoscopy every 10 years.  Flexible sigmoidoscopy or colonoscopy.** / Every 5 years for a flexible sigmoidoscopy or every 10 years for a colonoscopy beginning at age 50 and continuing until age 75.  Hepatitis C blood test.** / For all people born from 1945 through 1965 and any individual with known risks for hepatitis C.  Skin self-exam. / Monthly.  Influenza immunization.** / Every year.  Pneumococcal polysaccharide immunization.** / 1 to 2 doses if you smoke cigarettes or if you have certain chronic medical conditions.  Tetanus, diphtheria, pertussis (Tdap, Td) immunization.** / A one-time dose of Tdap vaccine. After that, you need a Td booster dose every 10 years.  Measles, mumps, rubella (MMR) immunization. / You need at least 1 dose of MMR if you were born in 1957 or later. You may also need a second dose.  Varicella immunization.** / Consult your caregiver.  Meningococcal immunization.** / Consult your caregiver.  Hepatitis A immunization.** / Consult your caregiver. 2 doses, 6 to 18 months apart.  Hepatitis B immunization.** / Consult your caregiver. 3 doses, usually over 6 months. Ages 65 and over  Blood pressure check.** / Every 1 to 2 years.  Lipid and cholesterol check.** / Every 5 years beginning at age 20.  Clinical breast exam.** / Every year after age 40.  Mammogram.** / Every year beginning at age 40 and continuing for as long as you are in good health. Consult with your caregiver.  Pap test.** / Every 3 years starting at age 30 through age 65 or 70 with a 3  consecutive normal Pap tests. Testing can be stopped between 65 and 70 with 3 consecutive normal Pap tests and no abnormal Pap or HPV tests in the past 10 years.  HPV screening.** / Every 3 years from ages 30 through ages 65 or 70 with a history of 3 consecutive normal Pap tests. Testing can be stopped between 65 and 70 with 3 consecutive normal Pap tests and no abnormal Pap or HPV tests in the past 10 years.  Fecal occult blood test (FOBT) of stool. / Every year beginning at age 50 and continuing until age 75. You may not need to do this test if you get a colonoscopy every 10 years.  Flexible sigmoidoscopy or colonoscopy.** / Every 5 years for a flexible sigmoidoscopy or every 10 years for a colonoscopy beginning at age 50 and continuing until age 75.  Hepatitis C blood test.** / For all people born from 1945 through 1965 and any individual with known risks for hepatitis C.  Osteoporosis screening.** / A one-time screening for women ages 65 and over and women at risk for fractures or osteoporosis.  Skin self-exam. / Monthly.  Influenza immunization.** / Every year.  Pneumococcal polysaccharide immunization.** / 1 dose at age 65 (or older) if you have never been vaccinated.  Tetanus, diphtheria, pertussis (Tdap, Td) immunization. / A one-time dose of Tdap vaccine if you are over   65 and have contact with an infant, are a Research scientist (physical sciences), or simply want to be protected from whooping cough. After that, you need a Td booster dose every 10 years.  Varicella immunization.** / Consult your caregiver.  Meningococcal immunization.** / Consult your caregiver.  Hepatitis A immunization.** / Consult your caregiver. 2 doses, 6 to 18 months apart.  Hepatitis B immunization.** / Check with your caregiver. 3 doses, usually over 6 months. ** Family history and personal history of risk and conditions may change your caregiver's recommendations. Document Released: 06/06/2001 Document Revised: 07/03/2011  Document Reviewed: 09/05/2010 [facility] Patient Information 2014 [place], [place]. Anemia, Nonspecific Your exam and blood tests show you are anemic. This means your blood (hemoglobin) level is low. Normal hemoglobin values are 12 to 15 g/dL for females and 14 to 17 g/dL for males. Make a note of your hemoglobin level today. The hematocrit percent is also used to measure anemia. A normal hematocrit is 38% to 46% in females and 42% to 49% in males. Make a note of your hematocrit level today. CAUSES  Anemia can be due to many different causes.  Excessive bleeding from periods (in women).  Intestinal bleeding.  Poor nutrition.  Kidney, thyroid, liver, and bone marrow diseases. SYMPTOMS  Anemia can come on suddenly (acute). It can also come on slowly. Symptoms can include:  Minor weakness.  Dizziness.  Palpitations.  Shortness of breath. Symptoms may be absent until half your hemoglobin is missing if it comes on slowly. Anemia due to acute blood loss from an injury or internal bleeding may require blood transfusion if the loss is severe. Hospital care is needed if you are anemic and there is significant continual blood loss. TREATMENT   Stool tests for blood (Hemoccult) and additional lab tests are often needed. This determines the best treatment.  Further checking on your condition and your response to treatment is very important. It often takes many weeks to correct anemia. Depending on the cause, treatment can include:  Supplements of iron.  Vitamins B12 and folic acid.  Hormone medicines. If your anemia is due to bleeding, finding the cause of the blood loss is very important. This will help avoid further problems. SEEK IMMEDIATE MEDICAL CARE IF:   You develop fainting, extreme weakness, shortness of breath, or chest pain.  You develop heavy vaginal bleeding.  You develop bloody or black, tarry stools or vomit up blood.  You develop a high fever, rash, repeated vomiting,  or dehydration. Document Released: 05/18/2004 Document Revised: 07/03/2011 Document Reviewed: 02/23/2009 [facility] Patient Information 2014 [place], [place].

## 2012-12-11 NOTE — Assessment & Plan Note (Signed)
No meds needed at this time.

## 2012-12-12 ENCOUNTER — Encounter: Payer: Self-pay | Admitting: Internal Medicine

## 2012-12-12 LAB — HEPATITIS C ANTIBODY: HCV Ab: NEGATIVE

## 2012-12-25 ENCOUNTER — Telehealth (INDEPENDENT_AMBULATORY_CARE_PROVIDER_SITE_OTHER): Payer: Self-pay | Admitting: General Surgery

## 2012-12-25 NOTE — Telephone Encounter (Signed)
Pt called to report her wound is healing well, but there is a visible suture in the middle of the deepest section.  She and her husband are not sure if it is to remain there or needs to be removed before healing over.  Paged and updated Dr. Lindie Spruce; stated suture is to remain in place.   Called pt back and updated.  OK to continue dressings and allow wound to heal over the suture.  She understands and will comply.  Has follow-up visit with JW on 01/09/13.

## 2013-01-06 ENCOUNTER — Telehealth (INDEPENDENT_AMBULATORY_CARE_PROVIDER_SITE_OTHER): Payer: Self-pay

## 2013-01-06 NOTE — Telephone Encounter (Signed)
Patient states she has had her stoma to bulge when she goes to bed then when she is up the stoma returns to normal size. I ask if she has constipation she said yes sometimes but not often. I ask if she had changed her diet she stated yes she has added fruits and vegetable. I advised her to not eat foods with seeds or make sure she removes them, also expressed  the change in her diet could cause a change in the stoma, she has appointment with Dr.Wyatt on Thursday he would assess the area at that time. Patient verbalized understanding

## 2013-01-09 ENCOUNTER — Telehealth (INDEPENDENT_AMBULATORY_CARE_PROVIDER_SITE_OTHER): Payer: Self-pay

## 2013-01-09 ENCOUNTER — Encounter (INDEPENDENT_AMBULATORY_CARE_PROVIDER_SITE_OTHER): Payer: Self-pay | Admitting: General Surgery

## 2013-01-09 ENCOUNTER — Ambulatory Visit (INDEPENDENT_AMBULATORY_CARE_PROVIDER_SITE_OTHER): Payer: Self-pay | Admitting: General Surgery

## 2013-01-09 VITALS — BP 134/88 | HR 71 | Temp 98.0°F | Resp 16 | Ht 60.0 in | Wt 102.8 lb

## 2013-01-09 DIAGNOSIS — Z933 Colostomy status: Secondary | ICD-10-CM | POA: Insufficient documentation

## 2013-01-09 NOTE — Addendum Note (Signed)
Addended by: Maryan Puls on: 01/09/2013 04:39 PM   Modules accepted: Orders

## 2013-01-09 NOTE — Progress Notes (Signed)
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 It is viable and functioning well. She has some hard stool in her bag.  The midline wound is completely healed. 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  She is otherwise doing well and is gaining her strength back prior to reversal of a colostomy we will need a Gastrografin enema to assess her distal component and also contrast through her stoma to assess for proximal diverticular disease.  Once the patient has had the contrast study also back in clinic to discuss with her timing of her reversal of colostomy.

## 2013-01-09 NOTE — Addendum Note (Signed)
Addended by: Maryan Puls on: 01/09/2013 04:19 PM   Modules accepted: Orders

## 2013-01-09 NOTE — Telephone Encounter (Signed)
Called and left message for patient to call our office RE:  Gastrografin Study scheduled for 01/13/2013 @ 9:45 am @ [facility], per radiology No Prep needed.

## 2013-01-10 ENCOUNTER — Telehealth (INDEPENDENT_AMBULATORY_CARE_PROVIDER_SITE_OTHER): Payer: Self-pay | Admitting: General Surgery

## 2013-01-10 NOTE — Telephone Encounter (Signed)
Pt called to ask if her appt at [facility] radiology could be changed from Monday, 01/13/13, to any other day.  She has a previously scheduled appt for then.  Called [facility] and appt was changed to Thursday, 01/16/13, 10:00 am, (arrive 9:45 am)  Will notify pt of it.

## 2013-01-13 ENCOUNTER — Other Ambulatory Visit (HOSPITAL_COMMUNITY): Payer: BC Managed Care – PPO

## 2013-01-16 ENCOUNTER — Ambulatory Visit (HOSPITAL_COMMUNITY)
Admission: RE | Admit: 2013-01-16 | Discharge: 2013-01-16 | Disposition: A | Discharge status: Home / Self Care | Payer: BC Managed Care – PPO | Source: Ambulatory Visit | Attending: General Surgery | Admitting: General Surgery

## 2013-01-16 DIAGNOSIS — R948 Abnormal results of function studies of other organs and systems: Secondary | ICD-10-CM | POA: Insufficient documentation

## 2013-01-16 DIAGNOSIS — Z433 Encounter for attention to colostomy: Secondary | ICD-10-CM | POA: Insufficient documentation

## 2013-01-16 DIAGNOSIS — Z01818 Encounter for other preprocedural examination: Secondary | ICD-10-CM | POA: Insufficient documentation

## 2013-01-16 DIAGNOSIS — Z933 Colostomy status: Secondary | ICD-10-CM

## 2013-01-16 MED ORDER — IOHEXOL 300 MG/ML  SOLN
450.0000 mL | Freq: Once | INTRAMUSCULAR | Status: AC | PRN
  Administered 2013-01-16: 450 mL

## 2013-02-04 ENCOUNTER — Encounter (INDEPENDENT_AMBULATORY_CARE_PROVIDER_SITE_OTHER): Payer: Self-pay | Admitting: General Surgery

## 2013-02-04 ENCOUNTER — Ambulatory Visit (INDEPENDENT_AMBULATORY_CARE_PROVIDER_SITE_OTHER): Payer: BC Managed Care – PPO | Admitting: General Surgery

## 2013-02-04 VITALS — BP 110/80 | HR 80 | Temp 98.6°F | Resp 14 | Ht 64.0 in | Wt 102.8 lb

## 2013-02-04 DIAGNOSIS — Z933 Colostomy status: Secondary | ICD-10-CM

## 2013-02-04 NOTE — Progress Notes (Signed)
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  I reviewed her Gastrografin studies which show significant amount of stool in both the rectal stump and also the residual large colon. I discussed with the patient the need to stress her bowel activity and movements.  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 The midline wound is completely healed. She is otherwise doing well.  We will plan for reversal of her colostomy with the possibility of a diverting loop ileostomy depending upon the integrity of her anastomosis at the time of surgery. I discussed in detail with this possibility with the patient and her husband. She will get a one day GoLYTELY prep with oral antibiotics. And she will be admitted the day of surgery.

## 2013-02-18 ENCOUNTER — Telehealth (INDEPENDENT_AMBULATORY_CARE_PROVIDER_SITE_OTHER): Payer: Self-pay

## 2013-02-18 NOTE — Telephone Encounter (Signed)
Patient states skin is irrigated . She was able to cut the ostomy wafer to prevent more skin break down and she covered the area with bandage which is helping. I advised her to put ABT on the area and continue to cover to prevent her clothes from rubbing the area

## 2013-02-27 ENCOUNTER — Other Ambulatory Visit: Payer: Self-pay

## 2013-03-05 ENCOUNTER — Encounter (HOSPITAL_COMMUNITY): Payer: Self-pay | Admitting: Pharmacy Technician

## 2013-03-07 ENCOUNTER — Telehealth (INDEPENDENT_AMBULATORY_CARE_PROVIDER_SITE_OTHER): Payer: Self-pay

## 2013-03-07 NOTE — Telephone Encounter (Signed)
Pt is requesting a letter from Dr. Lindie Spruce to excuse her husband from possible jury duty.  He has to appear on 03/11/13. She is scheduled for surgery on 03/17/13 by Dr. Lindie Spruce.  Pt was told a request would be sent to Dr. Lindie Spruce and this office would contact her with a response.

## 2013-03-08 NOTE — Telephone Encounter (Signed)
This would be fine to give him a letter excusing him from jury duty.

## 2013-03-08 NOTE — Pre-Procedure Instructions (Addendum)
Scherrie Seneca Ebel  03/08/2013   Your procedure is scheduled on:  November 24  Report to [facility] Entrance "A" [address] at [employer] AM.  Call this number if you have problems the morning of surgery: [phone]   Remember:   Do not eat food or drink liquids after midnight.   Take these medicines the morning of surgery with A SIP OF WATER: None, Follow Bowel Prep instructions   STOP Vitamins today   STOP Aspirin, Aleve, Naproxen, Advil, Ibuprofen, Vitamin, Herbs, and Supplements starting today   Do not wear jewelry, make-up or nail polish.  Do not wear lotions, powders, or perfumes. You may wear deodorant.  Do not shave 48 hours prior to surgery. Men may shave face and neck.  Do not bring valuables to the hospital.  [facility] is not responsible                  for any belongings or valuables.               Contacts, dentures or bridgework may not be worn into surgery.  Leave suitcase in the car. After surgery it may be brought to your room.  For patients admitted to the hospital, discharge time is determined by your                treatment team.               Special Instructions: Shower using CHG 2 nights before surgery and the night before surgery.  If you shower the day of surgery use CHG.  Use special wash - you have one bottle of CHG for all showers.  You should use approximately 1/3 of the bottle for each shower.   Please read over the following fact sheets that you were given: Pain Booklet, Coughing and Deep Breathing and Surgical Site Infection Prevention

## 2013-03-10 ENCOUNTER — Encounter (HOSPITAL_COMMUNITY)
Admission: RE | Admit: 2013-03-10 | Discharge: 2013-03-10 | Disposition: A | Discharge status: Home / Self Care | Payer: BC Managed Care – PPO | Source: Ambulatory Visit | Attending: General Surgery | Admitting: General Surgery

## 2013-03-10 ENCOUNTER — Encounter (HOSPITAL_COMMUNITY): Payer: Self-pay

## 2013-03-10 ENCOUNTER — Ambulatory Visit (HOSPITAL_COMMUNITY)
Admission: RE | Admit: 2013-03-10 | Discharge: 2013-03-10 | Disposition: A | Discharge status: Home / Self Care | Payer: BC Managed Care – PPO | Source: Ambulatory Visit | Attending: General Surgery | Admitting: General Surgery

## 2013-03-10 ENCOUNTER — Encounter (INDEPENDENT_AMBULATORY_CARE_PROVIDER_SITE_OTHER): Payer: Self-pay

## 2013-03-10 DIAGNOSIS — Z01812 Encounter for preprocedural laboratory examination: Secondary | ICD-10-CM | POA: Insufficient documentation

## 2013-03-10 HISTORY — DX: Gastro-esophageal reflux disease without esophagitis: K21.9

## 2013-03-10 HISTORY — DX: Headache: R51

## 2013-03-10 LAB — CBC WITH DIFFERENTIAL/PLATELET
Basophils Absolute: 0 10*3/uL (ref 0.0–0.1)
Basophils Relative: 1 % (ref 0–1)
Eosinophils Absolute: 0.1 10*3/uL (ref 0.0–0.7)
Eosinophils Relative: 1 % (ref 0–5)
Lymphocytes Relative: 25 % (ref 12–46)
Lymphs Abs: 1.4 10*3/uL (ref 0.7–4.0)
MCHC: 33.8 g/dL (ref 30.0–36.0)
MCV: 97.1 fL (ref 78.0–100.0)
Neutro Abs: 3.5 10*3/uL (ref 1.7–7.7)
Neutrophils Relative %: 60 % (ref 43–77)
Platelets: 181 10*3/uL (ref 150–400)
RBC: 4.42 MIL/uL (ref 3.87–5.11)
WBC: 5.8 10*3/uL (ref 4.0–10.5)

## 2013-03-10 LAB — BASIC METABOLIC PANEL
Calcium: 9.7 mg/dL (ref 8.4–10.5)
Creatinine, Ser: 0.7 mg/dL (ref 0.50–1.10)
GFR calc Af Amer: >90 mL/min (ref >90)
GFR calc non Af Amer: >90 mL/min (ref >90)
Glucose, Bld: 98 mg/dL (ref 70–99)
Sodium: 142 mEq/L (ref 135–145)

## 2013-03-10 LAB — TYPE AND SCREEN
ABO/RH(D): O POS
Antibody Screen: NEGATIVE

## 2013-03-10 NOTE — Telephone Encounter (Signed)
Letter up front for pick up  Pt aware 

## 2013-03-16 MED ORDER — ALVIMOPAN 12 MG PO CAPS
12.0000 mg | ORAL_CAPSULE | Freq: Once | ORAL | Status: AC
  Administered 2013-03-17: 12 mg via ORAL
  Filled 2013-03-16: qty 1

## 2013-03-16 MED ORDER — DEXTROSE 5 % IV SOLN
2.0000 g | INTRAVENOUS | Status: AC
  Administered 2013-03-17: 2 g via INTRAVENOUS
  Filled 2013-03-16: qty 2

## 2013-03-17 ENCOUNTER — Inpatient Hospital Stay (HOSPITAL_COMMUNITY): Payer: BC Managed Care – PPO | Admitting: Certified Registered Nurse Anesthetist

## 2013-03-17 ENCOUNTER — Encounter (HOSPITAL_COMMUNITY): Payer: Self-pay | Admitting: *Deleted

## 2013-03-17 ENCOUNTER — Encounter (HOSPITAL_COMMUNITY)
Admission: RE | Disposition: A | Discharge status: Home / Self Care | Payer: Self-pay | Source: Ambulatory Visit | Attending: General Surgery

## 2013-03-17 ENCOUNTER — Inpatient Hospital Stay (HOSPITAL_COMMUNITY)
Admission: RE | Admit: 2013-03-17 | Discharge: 2013-03-24 | DRG: 336 | Disposition: A | Discharge status: Home / Self Care | Payer: BC Managed Care – PPO | Source: Ambulatory Visit | Attending: General Surgery | Admitting: General Surgery

## 2013-03-17 ENCOUNTER — Encounter (HOSPITAL_COMMUNITY): Payer: BC Managed Care – PPO | Admitting: Certified Registered Nurse Anesthetist

## 2013-03-17 DIAGNOSIS — K560 Paralytic ileus: Secondary | ICD-10-CM | POA: Diagnosis not present

## 2013-03-17 DIAGNOSIS — Z433 Encounter for attention to colostomy: Secondary | ICD-10-CM

## 2013-03-17 DIAGNOSIS — Y838 Other surgical procedures as the cause of abnormal reaction of the patient, or of later complication, without mention of misadventure at the time of the procedure: Secondary | ICD-10-CM | POA: Diagnosis not present

## 2013-03-17 DIAGNOSIS — Z933 Colostomy status: Secondary | ICD-10-CM

## 2013-03-17 DIAGNOSIS — Y9210 Unspecified residential institution as the place of occurrence of the external cause: Secondary | ICD-10-CM | POA: Diagnosis not present

## 2013-03-17 DIAGNOSIS — K660 Peritoneal adhesions (postprocedural) (postinfection): Secondary | ICD-10-CM | POA: Diagnosis present

## 2013-03-17 DIAGNOSIS — K929 Disease of digestive system, unspecified: Secondary | ICD-10-CM | POA: Diagnosis not present

## 2013-03-17 HISTORY — PX: APPENDECTOMY: SHX54

## 2013-03-17 HISTORY — PX: COLOSTOMY CLOSURE: SHX1381

## 2013-03-17 HISTORY — PX: LYSIS OF ADHESION: SHX5961

## 2013-03-17 HISTORY — PX: COLOSTOMY REVERSAL: SHX5782

## 2013-03-17 SURGERY — LAPAROTOMY, FOR LYSIS OF ADHESIONS
Anesthesia: General | Wound class: Clean

## 2013-03-17 MED ORDER — ONDANSETRON HCL 4 MG PO TABS
4.0000 mg | ORAL_TABLET | Freq: Four times a day (QID) | ORAL | Status: DC | PRN
  Filled 2013-03-17: qty 1

## 2013-03-17 MED ORDER — PROPOFOL 10 MG/ML IV BOLUS
INTRAVENOUS | Status: DC | PRN
  Administered 2013-03-17: 140 mg via INTRAVENOUS

## 2013-03-17 MED ORDER — LIDOCAINE HCL (CARDIAC) 20 MG/ML IV SOLN
INTRAVENOUS | Status: DC | PRN
  Administered 2013-03-17: 80 mg via INTRAVENOUS

## 2013-03-17 MED ORDER — ENOXAPARIN SODIUM 40 MG/0.4ML ~~LOC~~ SOLN
40.0000 mg | SUBCUTANEOUS | Status: DC
  Administered 2013-03-18 – 2013-03-24 (×7): 40 mg via SUBCUTANEOUS
  Filled 2013-03-17 (×9): qty 0.4

## 2013-03-17 MED ORDER — LACTATED RINGERS IV SOLN
INTRAVENOUS | Status: DC | PRN
  Administered 2013-03-17 (×2): via INTRAVENOUS

## 2013-03-17 MED ORDER — MIDAZOLAM HCL 2 MG/2ML IJ SOLN: 1.0000 mg | INTRAMUSCULAR | Status: DC | PRN

## 2013-03-17 MED ORDER — HYDROMORPHONE HCL PF 1 MG/ML IJ SOLN
0.5000 mg | INTRAMUSCULAR | Status: DC | PRN
  Administered 2013-03-17 – 2013-03-20 (×6): 1 mg via INTRAVENOUS
  Filled 2013-03-17 (×6): qty 1

## 2013-03-17 MED ORDER — PEG 3350-KCL-NA BICARB-NACL 420 G PO SOLR
4000.0000 mL | Freq: Once | ORAL | Status: DC
  Filled 2013-03-17: qty 4000

## 2013-03-17 MED ORDER — OXYCODONE HCL 5 MG PO TABS
5.0000 mg | ORAL_TABLET | Freq: Once | ORAL | Status: AC | PRN
  Administered 2013-03-17: 5 mg via ORAL

## 2013-03-17 MED ORDER — ONDANSETRON HCL 4 MG/2ML IJ SOLN
4.0000 mg | Freq: Four times a day (QID) | INTRAMUSCULAR | Status: DC | PRN
  Administered 2013-03-17 – 2013-03-21 (×7): 4 mg via INTRAVENOUS
  Filled 2013-03-17 (×8): qty 2

## 2013-03-17 MED ORDER — GLYCOPYRROLATE 0.2 MG/ML IJ SOLN
INTRAMUSCULAR | Status: DC | PRN
  Administered 2013-03-17: 0.3 mg via INTRAVENOUS

## 2013-03-17 MED ORDER — ALVIMOPAN 12 MG PO CAPS
12.0000 mg | ORAL_CAPSULE | Freq: Two times a day (BID) | ORAL | Status: DC
  Administered 2013-03-18 – 2013-03-21 (×7): 12 mg via ORAL
  Filled 2013-03-17 (×14): qty 1

## 2013-03-17 MED ORDER — POVIDONE-IODINE 10 % EX OINT
TOPICAL_OINTMENT | CUTANEOUS | Status: AC
  Filled 2013-03-17: qty 28.35

## 2013-03-17 MED ORDER — NEOMYCIN SULFATE 500 MG PO TABS
1000.0000 mg | ORAL_TABLET | ORAL | Status: DC
  Filled 2013-03-17 (×2): qty 2

## 2013-03-17 MED ORDER — FENTANYL CITRATE 0.05 MG/ML IJ SOLN: 50.0000 ug | Freq: Once | INTRAMUSCULAR | Status: DC

## 2013-03-17 MED ORDER — NEOSTIGMINE METHYLSULFATE 1 MG/ML IJ SOLN
INTRAMUSCULAR | Status: DC | PRN
  Administered 2013-03-17: 2.5 mg via INTRAVENOUS

## 2013-03-17 MED ORDER — PROMETHAZINE HCL 25 MG/ML IJ SOLN
6.2500 mg | INTRAMUSCULAR | Status: DC | PRN
Start: 2013-03-17 — End: 2013-03-17

## 2013-03-17 MED ORDER — ACETAMINOPHEN 500 MG PO TABS: 500.0000 mg | ORAL_TABLET | Freq: Four times a day (QID) | ORAL | Status: DC | PRN

## 2013-03-17 MED ORDER — EPHEDRINE SULFATE 50 MG/ML IJ SOLN
INTRAMUSCULAR | Status: DC | PRN
  Administered 2013-03-17 (×2): 5 mg via INTRAVENOUS

## 2013-03-17 MED ORDER — VECURONIUM BROMIDE 10 MG IV SOLR
INTRAVENOUS | Status: DC | PRN
  Administered 2013-03-17: 2 mg via INTRAVENOUS
  Administered 2013-03-17: 1 mg via INTRAVENOUS

## 2013-03-17 MED ORDER — OXYCODONE HCL 5 MG/5ML PO SOLN: 5.0000 mg | Freq: Once | ORAL | Status: AC | PRN

## 2013-03-17 MED ORDER — 0.9 % SODIUM CHLORIDE (POUR BTL) OPTIME
TOPICAL | Status: DC | PRN
  Administered 2013-03-17 (×2): 2000 mL

## 2013-03-17 MED ORDER — HYDROMORPHONE HCL PF 1 MG/ML IJ SOLN
0.2500 mg | INTRAMUSCULAR | Status: DC | PRN
  Administered 2013-03-17 (×4): 0.5 mg via INTRAVENOUS

## 2013-03-17 MED ORDER — FENTANYL CITRATE 0.05 MG/ML IJ SOLN
INTRAMUSCULAR | Status: DC | PRN
  Administered 2013-03-17 (×4): 50 ug via INTRAVENOUS
  Administered 2013-03-17: 100 ug via INTRAVENOUS
  Administered 2013-03-17: 50 ug via INTRAVENOUS

## 2013-03-17 MED ORDER — OXYCODONE-ACETAMINOPHEN 5-325 MG PO TABS
1.0000 | ORAL_TABLET | ORAL | Status: DC | PRN
  Administered 2013-03-18 (×2): 2 via ORAL
  Administered 2013-03-18: 1 via ORAL
  Administered 2013-03-19: 2 via ORAL
  Administered 2013-03-19: 1 via ORAL
  Administered 2013-03-19 – 2013-03-20 (×5): 2 via ORAL
  Filled 2013-03-17 (×2): qty 2
  Filled 2013-03-17: qty 1
  Filled 2013-03-17 (×7): qty 2

## 2013-03-17 MED ORDER — DEXTROSE 5 % IV SOLN
2.0000 g | Freq: Two times a day (BID) | INTRAVENOUS | Status: AC
  Administered 2013-03-17: 2 g via INTRAVENOUS
  Filled 2013-03-17: qty 2

## 2013-03-17 MED ORDER — ROCURONIUM BROMIDE 100 MG/10ML IV SOLN
INTRAVENOUS | Status: DC | PRN
  Administered 2013-03-17: 40 mg via INTRAVENOUS
  Administered 2013-03-17: 10 mg via INTRAVENOUS

## 2013-03-17 MED ORDER — OXYCODONE HCL 5 MG PO TABS
ORAL_TABLET | ORAL | Status: AC
  Filled 2013-03-17: qty 1

## 2013-03-17 MED ORDER — ERYTHROMYCIN BASE 250 MG PO TABS
1000.0000 mg | ORAL_TABLET | ORAL | Status: DC
  Filled 2013-03-17 (×2): qty 4

## 2013-03-17 MED ORDER — HYDROMORPHONE HCL PF 1 MG/ML IJ SOLN
INTRAMUSCULAR | Status: AC
  Filled 2013-03-17: qty 1

## 2013-03-17 MED ORDER — ALUM & MAG HYDROXIDE-SIMETH 200-200-20 MG/5ML PO SUSP
30.0000 mL | Freq: Four times a day (QID) | ORAL | Status: DC | PRN
  Administered 2013-03-19 – 2013-03-20 (×3): 30 mL via ORAL
  Filled 2013-03-17 (×3): qty 30

## 2013-03-17 MED ORDER — ONDANSETRON HCL 4 MG/2ML IJ SOLN
INTRAMUSCULAR | Status: DC | PRN
  Administered 2013-03-17 (×2): 4 mg via INTRAVENOUS

## 2013-03-17 MED ORDER — MIDAZOLAM HCL 5 MG/5ML IJ SOLN
INTRAMUSCULAR | Status: DC | PRN
  Administered 2013-03-17 (×2): 1 mg via INTRAVENOUS

## 2013-03-17 MED ORDER — KCL IN DEXTROSE-NACL 20-5-0.45 MEQ/L-%-% IV SOLN
INTRAVENOUS | Status: DC
  Administered 2013-03-17 – 2013-03-24 (×11): via INTRAVENOUS
  Filled 2013-03-17 (×15): qty 1000

## 2013-03-17 MED ORDER — ALBUMIN HUMAN 5 % IV SOLN
INTRAVENOUS | Status: DC | PRN
  Administered 2013-03-17: 09:00:00 via INTRAVENOUS

## 2013-03-17 SURGICAL SUPPLY — 67 items
BLADE SURG ROTATE 9660 (MISCELLANEOUS) IMPLANT
CANISTER SUCTION 2500CC (MISCELLANEOUS) ×3 IMPLANT
CANISTER WOUND CARE 500ML ATS (WOUND CARE) ×3 IMPLANT
CHLORAPREP W/TINT 26ML (MISCELLANEOUS) ×3 IMPLANT
COVER LIGHT HANDLE  DEROYL (MISCELLANEOUS) ×3 IMPLANT
COVER MAYO STAND STRL (DRAPES) ×6 IMPLANT
COVER SURGICAL LIGHT HANDLE (MISCELLANEOUS) ×3 IMPLANT
DRAPE LAPAROSCOPIC ABDOMINAL (DRAPES) ×3 IMPLANT
DRAPE PROXIMA HALF (DRAPES) ×6 IMPLANT
DRAPE UTILITY 15X26 W/TAPE STR (DRAPE) ×15 IMPLANT
DRAPE WARM FLUID 44X44 (DRAPE) ×3 IMPLANT
DRSG OPSITE 6X11 MED (GAUZE/BANDAGES/DRESSINGS) ×3 IMPLANT
DRSG OPSITE POSTOP 4X10 (GAUZE/BANDAGES/DRESSINGS) IMPLANT
DRSG OPSITE POSTOP 4X8 (GAUZE/BANDAGES/DRESSINGS) IMPLANT
DRSG VAC ATS MED SENSATRAC (GAUZE/BANDAGES/DRESSINGS) ×3 IMPLANT
ELECT BLADE 6.5 EXT (BLADE) ×3 IMPLANT
ELECT CAUTERY BLADE 6.4 (BLADE) ×6 IMPLANT
ELECT REM PT RETURN 9FT ADLT (ELECTROSURGICAL) ×3
ELECTRODE REM PT RTRN 9FT ADLT (ELECTROSURGICAL) ×2 IMPLANT
GLOVE BIO SURGEON STRL SZ 6.5 (GLOVE) ×3 IMPLANT
GLOVE BIO SURGEON STRL SZ7 (GLOVE) ×3 IMPLANT
GLOVE BIO SURGEON STRL SZ7.5 (GLOVE) ×12 IMPLANT
GLOVE BIOGEL PI IND STRL 6.5 (GLOVE) ×2 IMPLANT
GLOVE BIOGEL PI IND STRL 7.0 (GLOVE) ×4 IMPLANT
GLOVE BIOGEL PI IND STRL 7.5 (GLOVE) ×8 IMPLANT
GLOVE BIOGEL PI IND STRL 8 (GLOVE) ×4 IMPLANT
GLOVE BIOGEL PI INDICATOR 6.5 (GLOVE) ×1
GLOVE BIOGEL PI INDICATOR 7.0 (GLOVE) ×2
GLOVE BIOGEL PI INDICATOR 7.5 (GLOVE) ×4
GLOVE BIOGEL PI INDICATOR 8 (GLOVE) ×2
GLOVE ECLIPSE 7.5 STRL STRAW (GLOVE) ×6 IMPLANT
GLOVE SURG SIGNA 7.5 PF LTX (GLOVE) ×9 IMPLANT
GOWN STRL NON-REIN LRG LVL3 (GOWN DISPOSABLE) ×27 IMPLANT
GOWN STRL REIN XL XLG (GOWN DISPOSABLE) ×6 IMPLANT
KIT BASIN OR (CUSTOM PROCEDURE TRAY) ×3 IMPLANT
KIT ROOM TURNOVER OR (KITS) ×3 IMPLANT
LEGGING LITHOTOMY PAIR STRL (DRAPES) IMPLANT
LIGASURE IMPACT 36 18CM CVD LR (INSTRUMENTS) IMPLANT
NS IRRIG 1000ML POUR BTL (IV SOLUTION) ×12 IMPLANT
PACK GENERAL/GYN (CUSTOM PROCEDURE TRAY) ×3 IMPLANT
PAD ARMBOARD 7.5X6 YLW CONV (MISCELLANEOUS) ×3 IMPLANT
PENCIL BUTTON HOLSTER BLD 10FT (ELECTRODE) ×3 IMPLANT
RELOAD PROXIMATE TA60MM BLUE (ENDOMECHANICALS) ×9 IMPLANT
SEPRAFILM PROCEDURAL PACK 3X5 (MISCELLANEOUS) ×3 IMPLANT
SPECIMEN JAR MEDIUM (MISCELLANEOUS) IMPLANT
SPONGE GAUZE 4X4 12PLY (GAUZE/BANDAGES/DRESSINGS) ×3 IMPLANT
SPONGE LAP 18X18 X RAY DECT (DISPOSABLE) ×6 IMPLANT
STAPLER CIRC CVD 29MM 37CM (STAPLE) ×3 IMPLANT
STAPLER GUN LINEAR PROX 60 (STAPLE) ×3 IMPLANT
STAPLER VISISTAT 35W (STAPLE) ×3 IMPLANT
SUCTION POOLE TIP (SUCTIONS) ×3 IMPLANT
SURGILUBE 2OZ TUBE FLIPTOP (MISCELLANEOUS) IMPLANT
SUT NOVA NAB DX-16 0-1 5-0 T12 (SUTURE) ×6 IMPLANT
SUT PDS AB 1 TP1 96 (SUTURE) ×6 IMPLANT
SUT PROLENE 2 0 KS (SUTURE) ×3 IMPLANT
SUT SILK 2 0 SH (SUTURE) ×3 IMPLANT
SUT SILK 2 0 SH CR/8 (SUTURE) ×3 IMPLANT
SUT SILK 2 0 TIES 10X30 (SUTURE) ×3 IMPLANT
SUT SILK 3 0 SH CR/8 (SUTURE) ×6 IMPLANT
SUT SILK 3 0 TIES 10X30 (SUTURE) ×3 IMPLANT
SYR BULB IRRIGATION 50ML (SYRINGE) ×3 IMPLANT
TOWEL OR 17X24 6PK STRL BLUE (TOWEL DISPOSABLE) ×3 IMPLANT
TOWEL OR 17X26 10 PK STRL BLUE (TOWEL DISPOSABLE) ×6 IMPLANT
TRAY FOLEY CATH 14FRSI W/METER (CATHETERS) ×3 IMPLANT
TRAY PROCTOSCOPIC FIBER OPTIC (SET/KITS/TRAYS/PACK) IMPLANT
TUBE CONNECTING 12X1/4 (SUCTIONS) ×3 IMPLANT
YANKAUER SUCT BULB TIP NO VENT (SUCTIONS) ×3 IMPLANT

## 2013-03-17 NOTE — Anesthesia Procedure Notes (Signed)
Procedure Name: Intubation Date/Time: 03/17/2013 7:47 AM Performed by: Margaree Mackintosh Pre-anesthesia Checklist: Patient identified, Timeout performed, Emergency Drugs available, Suction available and Patient being monitored Patient Re-evaluated:Patient Re-evaluated prior to inductionOxygen Delivery Method: Circle system utilized Preoxygenation: Pre-oxygenation with 100% oxygen Intubation Type: IV induction Ventilation: Mask ventilation without difficulty Laryngoscope Size: Mac and 3 Grade View: Grade I Tube type: Oral Tube size: 7.0 mm Number of attempts: 1 Airway Equipment and Method: Stylet Placement Confirmation: ETT inserted through vocal cords under direct vision,  positive ETCO2 and breath sounds checked- equal and bilateral Secured at: 21 cm Tube secured with: Tape Dental Injury: Teeth and Oropharynx as per pre-operative assessment

## 2013-03-17 NOTE — Op Note (Signed)
OPERATIVE REPORT  DATE OF OPERATION: 03/17/2013  PATIENT:  Alyssa Robinson  62 y.o. female  PRE-OPERATIVE DIAGNOSIS:  Functioning colostomy  POST-OPERATIVE DIAGNOSIS:  Functioning colostomy  PROCEDURE:  Procedure(s): LYSIS OF ADHESION ENTERORRHAPHY INCIDENTAL APPENDECTOMY COLOSTOMY REVERSAL  SURGEON:  Surgeon(s): Cherylynn Ridges, MD Shelly Rubenstein, MD  ASSISTANT: Magnus Ivan, M.D., [facility] PA-S  ANESTHESIA:   general  EBL: 200 ml  BLOOD ADMINISTERED: none  DRAINS: Urinary Catheter (Foley)   SPECIMEN:  Source of Specimen:  Colostomy and appendix  COUNTS CORRECT:  YES  PROCEDURE DETAILS: The patient was taken to the operating room and placed on the table in the supine position. After an adequate general endotracheal anesthetic was administered a proper time out was performed identifying the patient and the procedure to be performed, and subsequently her prolapse left upper quadrant colostomy was oversewn using a 2-0 silk suture.  The patient was subsequently prepped and draped in usual sterile manner exposing her perineum in lithotomy position and abdominal cavity all with Betadine.  A midline incision was made using a #10 blade excising the midline widened incision. We took it down to and through the midline fascia using electrocautery, and in the lower midportion of the abdomen there  was small bowel loop attached to the midline incision through which a greater than 50% enterotomy was made. We subsequently entered the peritoneal cavity took down some adhesions then subsequently completed the enterotomy and repaired it using interrupted 3-0 silk full thickness sutures.  A significant portion of the operation was spent taking down adhesions. We were able to do so down into the pelvis and identified the sigmoid stump.  Attached to the sigmoid stump was the appendix which was not inflamed.  After this was done the colostomy was taken down from the anterior abdominal wall using  electrocautery a #10 blade. We subsequently resected approximately 6 inches of the distal colostomy and sent as a specimen.  In mobilizing the distal sigmoid colon the patient's appendix was noted to be densely adhesed to the staple line. We freed up the appendix, tip is blood supply using a 2-0 silk suture. And came across the base of the cecum at the appendix using a TX 60 stapler, and then  we subsequently used a TA 60 stapler across the proximal staple line of the sigmoid colon in order to provide for fresh border for the anastomosis.  Once we had all areas freed, the assistant surgeon went to the perineum to pass the staple EEA device. It and properly sized to fit into the colon proximally using sizers from the EEA stapler. A pursestring of 2-0 Prolene using a Keith needle was passed through the pursestring device. Using the pursestring proximal bowel the 29 mm appropriately sized EEA staple anvil was passed into the proximal colon and tied down with a pursestring suture. As the assistant surgeon was passing the stapling device through the anus and up the rectum to the anastomotic line does a bit of redundancy of the sigmoid colon which had to be reduced and removed using a second TX60 stapler. Once this was done we were able to pass the trocar of the stapling device through the staple line anterior to the staple line. We performed an end-to-end anastomoses using the anvil. Once this was done it was tested for leakage using air insufflation into the rectum and a pelvis full saline, and there was no evidence of leakage of gas.  We follow the small bowel from the ligament of Treitz  down to the terminal ileum. The repair site of the small bowel was noted not to have leakage. We subsequently irrigated with copious amounts saline then subsequently changed gowns and gloves.  Once we change her closed the colostomy site using interrupted figure-of-eight stitches of #1 Novafil. The midline fascia was closed  using running looped #1 PDS suture. The skin was closed all sites using stainless steel staples. An incisional vacuum-assisted closure negative pressure wound dressing was applied. All counts were correct including needles, sponges, and instruments.  PATIENT DISPOSITION:  PACU - hemodynamically stable.   Kole Hilyard O 11/24/201410:36 AM

## 2013-03-17 NOTE — Transfer of Care (Signed)
Immediate Anesthesia Transfer of Care Note  Patient: Alyssa Robinson  Procedure(s) Performed: Procedure(s): LYSIS OF ADHESION INCIDENTAL APPENDECTOMY COLOSTOMY REVERSAL  Patient Location: PACU  Anesthesia Type:General  Level of Consciousness: awake, alert  and oriented  Airway & Oxygen Therapy: Patient Spontanous Breathing and Patient connected to nasal cannula oxygen  Post-op Assessment: Report given to PACU RN, Post -op Vital signs reviewed and stable and Patient moving all extremities X 4  Post vital signs: Reviewed and stable  Complications: No apparent anesthesia complications

## 2013-03-17 NOTE — Progress Notes (Signed)
Arrived to room 6n10. A/ox4, husband at bedside, oriented to surroundings, denies nausea/pain

## 2013-03-17 NOTE — Anesthesia Preprocedure Evaluation (Signed)
Anesthesia Evaluation  Patient identified by MRN, date of birth, ID band Patient awake    Reviewed: Allergy & Precautions, H&P , NPO status , Patient's Chart, lab work & pertinent test results  Airway Mallampati: II TM Distance: <3 FB Neck ROM: full    Dental   Pulmonary  breath sounds clear to auscultation        Cardiovascular hypertension, Rhythm:Regular Rate:Normal     Neuro/Psych  Headaches,    GI/Hepatic GERD-  ,diverticulosis   Endo/Other    Renal/GU      Musculoskeletal   Abdominal   Peds  Hematology   Anesthesia Other Findings   Reproductive/Obstetrics                           Anesthesia Physical Anesthesia Plan  ASA: II  Anesthesia Plan: General   Post-op Pain Management:    Induction: Intravenous  Airway Management Planned: Oral ETT  Additional Equipment:   Intra-op Plan:   Post-operative Plan: Extubation in OR  Informed Consent: I have reviewed the patients History and Physical, chart, labs and discussed the procedure including the risks, benefits and alternatives for the proposed anesthesia with the patient or authorized representative who has indicated his/her understanding and acceptance.     Plan Discussed with: CRNA and Surgeon  Anesthesia Plan Comments:         Anesthesia Quick Evaluation

## 2013-03-17 NOTE — H&P (Signed)
  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  I reviewed her Gastrografin studies which show significant amount of stool in both the rectal stump and also the residual large colon. I discussed with the patient the need to stress her bowel activity and movements.  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 The midline wound is completely healed. She is otherwise doing well.  We will plan for reversal of her colostomy with the possibility of a diverting loop ileostomy depending upon the integrity of her anastomosis at the time of surgery. I discussed in detail with this possibility with the patient and her husband. She will get a one day GoLYTELY prep with oral antibiotics. And she will be admitted the day of surgery.      The patient tolerated her bowel prep well.  Will plan on reversal with possible diverting loop ileostomy.  Marta Lamas. Gae Bon, MD, FACS [phone] [facility] Surgery

## 2013-03-17 NOTE — Progress Notes (Signed)
Pharmacy states that antibiotic dose pre-operative  Appropriate for pre-op.

## 2013-03-17 NOTE — Preoperative (Signed)
Beta Blockers   Reason not to administer Beta Blockers:Not Applicable 

## 2013-03-18 LAB — CBC
HCT: 32.2 % — ABNORMAL LOW (ref 36.0–46.0)
Hemoglobin: 11.2 g/dL — ABNORMAL LOW (ref 12.0–15.0)
MCV: 95.3 fL (ref 78.0–100.0)
Platelets: 170 10*3/uL (ref 150–400)
RBC: 3.38 MIL/uL — ABNORMAL LOW (ref 3.87–5.11)
WBC: 7.6 10*3/uL (ref 4.0–10.5)

## 2013-03-18 LAB — BASIC METABOLIC PANEL
BUN: 13 mg/dL (ref 6–23)
CO2: 24 mEq/L (ref 19–32)
Calcium: 8.3 mg/dL — ABNORMAL LOW (ref 8.4–10.5)
Chloride: 105 mEq/L (ref 96–112)
GFR calc Af Amer: >90 mL/min (ref >90)
GFR calc non Af Amer: >90 mL/min (ref >90)
Potassium: 4.2 mEq/L (ref 3.5–5.1)
Sodium: 138 mEq/L (ref 135–145)

## 2013-03-18 NOTE — Progress Notes (Signed)
GS Progress Note Subjective: Patient doing well.  Has been out of bed.  No BM or flatus.  Objective: Vital signs in last 24 hours: Temp:  [96.2 F (35.7 C)-98.1 F (36.7 C)] 97.9 F (36.6 C) (11/25 0622) Pulse Rate:  [58-101] 70 (11/25 0622) Resp:  [11-22] 18 (11/25 0622) BP: (120-146)/(58-75) 120/67 mmHg (11/25 0622) SpO2:  [96 %-100 %] 98 % (11/25 0622)    Intake/Output from previous day: 11/24 0701 - 11/25 0700 In: 1870 [P.O.:40; I.V.:1580; IV Piggyback:250] Out: 1085 [Urine:635; Drains:50; Blood:400] Intake/Output this shift:    Lungs: Clear  Abd: Soft, tender, particularly on the right side.  Has good bowel sounds.  Extremities: No clinical signs or symptoms of DVT  Neuro: Intact  Lab Results: CBC  No results found for this basename: WBC, HGB, HCT, PLT,  in the last 72 hours BMET No results found for this basename: NA, K, CL, CO2, GLUCOSE, BUN, CREATININE, CALCIUM,  in the last 72 hours PT/INR No results found for this basename: LABPROT, INR,  in the last 72 hours ABG No results found for this basename: PHART, PCO2, PO2, HCO3,  in the last 72 hours  Studies/Results: No results found.  Anti-infectives: Anti-infectives   Start     Dose/Rate Route Frequency Ordered Stop   03/17/13 2000  cefoTEtan (CEFOTAN) 2 g in dextrose 5 % 50 mL IVPB     2 g 100 mL/hr over 30 Minutes Intravenous Every 12 hours 03/17/13 1249 03/17/13 2040   03/17/13 0625  neomycin (MYCIFRADIN) tablet 1,000 mg  Status:  Discontinued     1,000 mg Oral 3 times per day 03/17/13 0625 03/17/13 1237   03/17/13 0625  erythromycin (E-MYCIN) tablet 1,000 mg  Status:  Discontinued     1,000 mg Oral 3 times per day 03/17/13 1610 03/17/13 1237   03/17/13 0600  cefoTEtan (CEFOTAN) 2 g in dextrose 5 % 50 mL IVPB     2 g 100 mL/hr over 30 Minutes Intravenous On call to O.R. 03/16/13 1428 03/17/13 0753      Assessment/Plan: s/p Procedure(s): LYSIS OF ADHESION INCIDENTAL APPENDECTOMY COLOSTOMY  REVERSAL d/c foley Advance diet Continue IVFs  LOS: 1 day    Marta Lamas. Gae Bon, MD, FACS [phone] [facility] 03/18/2013

## 2013-03-18 NOTE — Anesthesia Postprocedure Evaluation (Signed)
  Anesthesia Post-op Note  Patient: Alyssa Robinson  Procedure(s) Performed: Procedure(s): LYSIS OF ADHESION INCIDENTAL APPENDECTOMY COLOSTOMY REVERSAL  Patient Location: PACU  Anesthesia Type:General  Level of Consciousness: awake  Airway and Oxygen Therapy: Patient Spontanous Breathing  Post-op Pain: mild  Post-op Assessment: Post-op Vital signs reviewed, Patient's Cardiovascular Status Stable, Respiratory Function Stable, Patent Airway, No signs of Nausea or vomiting and Pain level controlled  Post-op Vital Signs: Reviewed and stable  Complications: No apparent anesthesia complications

## 2013-03-19 ENCOUNTER — Encounter (HOSPITAL_COMMUNITY): Payer: Self-pay | Admitting: General Surgery

## 2013-03-19 MED ORDER — CHLORHEXIDINE GLUCONATE 0.12 % MT SOLN
15.0000 mL | Freq: Two times a day (BID) | OROMUCOSAL | Status: DC
  Administered 2013-03-19 – 2013-03-24 (×10): 15 mL via OROMUCOSAL
  Filled 2013-03-19 (×10): qty 15

## 2013-03-19 MED ORDER — BIOTENE DRY MOUTH MT LIQD
15.0000 mL | Freq: Two times a day (BID) | OROMUCOSAL | Status: DC
  Administered 2013-03-19 – 2013-03-23 (×9): 15 mL via OROMUCOSAL

## 2013-03-19 NOTE — Progress Notes (Signed)
GS Progress Note Subjective: Patient states that her pain is much better.  Objective: Vital signs in last 24 hours: Temp:  [98.1 F (36.7 C)-98.3 F (36.8 C)] 98.1 F (36.7 C) (11/26 0960) Pulse Rate:  [71-76] 76 (11/26 0608) Resp:  [16-18] 16 (11/26 4540) BP: (125-140)/(60-74) 128/70 mmHg (11/26 0608) SpO2:  [97 %-100 %] 97 % (11/26 0608) Last BM Date: 03/17/13  Intake/Output from previous day: 11/25 0701 - 11/26 0700 In: 3941.7 [P.O.:480; I.V.:3461.7] Out: 1575 [Urine:1525; Drains:50] Intake/Output this shift: Total I/O In: -  Out: 300 [Urine:300]  Lungs: Clear to auscultation.  Abd: Soft, still very jumpy to light touch.  Excellent bowel sounds.  No BM or flatus.  Extremities: No clinical signs or symptoms of DVT  Neuro: Intact`  Lab Results: CBC   Recent Labs  03/18/13 0645  WBC 7.6  HGB 11.2*  HCT 32.2*  PLT 170   BMET  Recent Labs  03/18/13 0645  NA 138  K 4.2  CL 105  CO2 24  GLUCOSE 130*  BUN 13  CREATININE 0.70  CALCIUM 8.3*   PT/INR No results found for this basename: LABPROT, INR,  in the last 72 hours ABG No results found for this basename: PHART, PCO2, PO2, HCO3,  in the last 72 hours  Studies/Results: No results found.  Anti-infectives: Anti-infectives   Start     Dose/Rate Route Frequency Ordered Stop   03/17/13 2000  cefoTEtan (CEFOTAN) 2 g in dextrose 5 % 50 mL IVPB     2 g 100 mL/hr over 30 Minutes Intravenous Every 12 hours 03/17/13 1249 03/17/13 2040   03/17/13 0625  neomycin (MYCIFRADIN) tablet 1,000 mg  Status:  Discontinued     1,000 mg Oral 3 times per day 03/17/13 0625 03/17/13 1237   03/17/13 0625  erythromycin (E-MYCIN) tablet 1,000 mg  Status:  Discontinued     1,000 mg Oral 3 times per day 03/17/13 9811 03/17/13 1237   03/17/13 0600  cefoTEtan (CEFOTAN) 2 g in dextrose 5 % 50 mL IVPB     2 g 100 mL/hr over 30 Minutes Intravenous On call to O.R. 03/16/13 1428 03/17/13 0753      Assessment/Plan: s/p  Procedure(s): LYSIS OF ADHESION INCIDENTAL APPENDECTOMY COLOSTOMY REVERSAL Continue current management.  LOS: 2 days    Marta Lamas. Gae Bon, MD, FACS [phone] [facility] Surgery 03/19/2013

## 2013-03-20 LAB — CBC
HCT: 33.5 % — ABNORMAL LOW (ref 36.0–46.0)
Hemoglobin: 10.9 g/dL — ABNORMAL LOW (ref 12.0–15.0)
MCH: 32.2 pg (ref 26.0–34.0)
MCHC: 32.5 g/dL (ref 30.0–36.0)
RBC: 3.39 MIL/uL — ABNORMAL LOW (ref 3.87–5.11)

## 2013-03-20 LAB — BASIC METABOLIC PANEL
BUN: 5 mg/dL — ABNORMAL LOW (ref 6–23)
CO2: 29 mEq/L (ref 19–32)
Calcium: 8.7 mg/dL (ref 8.4–10.5)
Chloride: 104 mEq/L (ref 96–112)
Creatinine, Ser: 0.6 mg/dL (ref 0.50–1.10)
GFR calc non Af Amer: >90 mL/min (ref >90)
Glucose, Bld: 117 mg/dL — ABNORMAL HIGH (ref 70–99)
Potassium: 3.8 mEq/L (ref 3.5–5.1)
Sodium: 141 mEq/L (ref 135–145)

## 2013-03-20 NOTE — Progress Notes (Signed)
3 Days Post-Op  Subjective: Stable and alert. No stool or flatus yet. Voiding without difficulty. Beginning to ambulate a little.  Objective: Vital signs in last 24 hours: Temp:  [97.1 F (36.2 C)-98.3 F (36.8 C)] 98.1 F (36.7 C) (11/27 0540) Pulse Rate:  [70-78] 70 (11/27 0540) Resp:  [18] 18 (11/27 0540) BP: (126-151)/(60-84) 151/84 mmHg (11/27 0540) SpO2:  [97 %-98 %] 97 % (11/27 0540) Last BM Date: 03/17/13  Intake/Output from previous day: 11/26 0701 - 11/27 0700 In: 1907 [I.V.:1907] Out: 3050 [Urine:3050] Intake/Output this shift:    General appearance: alert. Pleasant. Cooperative. Mental status normal. Husband present. Resp: clear to auscultation bilaterally GI: abdomen soft but somewhat distended and tympanitic. Bowel sounds present. A little bit tender and jumpy everywhere. Wound VAC looks good.  Lab Results:  No results found for this or any previous visit (from the past 24 hour(s)).   Studies/Results: @RISRSLT24 @  . alvimopan  12 mg Oral BID  . antiseptic oral rinse  15 mL Mouth Rinse q12n4p  . chlorhexidine  15 mL Mouth Rinse BID  . enoxaparin (LOVENOX) injection  40 mg Subcutaneous Q24H     Assessment/Plan: s/p Procedure(s): LYSIS OF ADHESION INCIDENTAL APPENDECTOMY COLOSTOMY REVERSAL   POD #3. Resection and closure of colostomy with stapling device, incidental appendectomy. Stable Ileus, not unexpected Continue entereg Did not advance diet Check lab work today to rule out electrolyte disorder Increase ambulation  Check pathology. Final report pending.  @PROBHOSP @  LOS: 3 days    Astryd Pearcy M 03/20/2013  . .prob

## 2013-03-21 ENCOUNTER — Inpatient Hospital Stay (HOSPITAL_COMMUNITY): Payer: BC Managed Care – PPO

## 2013-03-21 MED ORDER — PROMETHAZINE HCL 25 MG/ML IJ SOLN
12.5000 mg | Freq: Four times a day (QID) | INTRAMUSCULAR | Status: DC | PRN
  Administered 2013-03-21 (×2): 12.5 mg via INTRAVENOUS
  Filled 2013-03-21 (×2): qty 1

## 2013-03-21 MED ORDER — FENTANYL CITRATE 0.05 MG/ML IJ SOLN
12.5000 ug | INTRAMUSCULAR | Status: DC | PRN
  Administered 2013-03-21: 25 ug via INTRAVENOUS
  Filled 2013-03-21: qty 2

## 2013-03-21 MED ORDER — POTASSIUM CHLORIDE 10 MEQ/100ML IV SOLN
10.0000 meq | INTRAVENOUS | Status: AC
  Administered 2013-03-21: 10 meq via INTRAVENOUS
  Filled 2013-03-21 (×2): qty 100

## 2013-03-21 MED ORDER — POTASSIUM CHLORIDE 10 MEQ/100ML IV SOLN
10.0000 meq | INTRAVENOUS | Status: AC
  Administered 2013-03-21 (×3): 10 meq via INTRAVENOUS
  Filled 2013-03-21 (×3): qty 100

## 2013-03-21 NOTE — Progress Notes (Signed)
DR Dwain Sarna called re: x-ray - to call back if has vomiting.

## 2013-03-21 NOTE — Progress Notes (Signed)
4 Days Post-Op  Subjective: Nauseated and vomited twice. No stool or flatus she had. Pain about the same, not worse. Ambulating in halls.  Afebrile. Heart rate 72. Stable.Excellent urine output.  Hemoglobin 10.9. WBC 4700. Potassium 3.8. Glucose 117. Creatinine 0.6.  Objective: Vital signs in last 24 hours: Temp:  [97.4 F (36.3 C)-98.2 F (36.8 C)] 97.4 F (36.3 C) (11/28 0520) Pulse Rate:  [70-72] 72 (11/28 0520) Resp:  [16] 16 (11/28 0520) BP: (139-169)/(68-75) 169/75 mmHg (11/28 0520) SpO2:  [98 %-100 %] 98 % (11/28 0520) Weight:  [106 lb (48.081 kg)] 106 lb (48.081 kg) (11/27 0922) Last BM Date: 03/17/13  Intake/Output from previous day: 11/27 0701 - 11/28 0700 In: 1515 [P.O.:240; I.V.:1275] Out: 1400 [Urine:1400] Intake/Output this shift:    EXAM: General appearance: alert. Pleasant. Mild distress. Does not feel good from nausea.. Mental status normal. Husband present.  Resp: clear to auscultation bilaterally  GI: abdomen soft but somewhat distended and tympanitic. Bowel sounds present. A little bit tender and jumpy everywhere. Wound VAC looks good.  Lab Results:  Results for orders placed during the hospital encounter of 03/17/13 (from the past 24 hour(s))  CBC     Status: Abnormal   Collection Time    03/20/13  9:30 AM      Result Value Range   WBC 4.7  4.0 - 10.5 K/uL   RBC 3.39 (*) 3.87 - 5.11 MIL/uL   Hemoglobin 10.9 (*) 12.0 - 15.0 g/dL   HCT 16.1 (*) 09.6 - 04.5 %   MCV 98.8  78.0 - 100.0 fL   MCH 32.2  26.0 - 34.0 pg   MCHC 32.5  30.0 - 36.0 g/dL   RDW 40.9  81.1 - 91.4 %   Platelets 170  150 - 400 K/uL  BASIC METABOLIC PANEL     Status: Abnormal   Collection Time    03/20/13  9:30 AM      Result Value Range   Sodium 141  135 - 145 mEq/L   Potassium 3.8  3.5 - 5.1 mEq/L   Chloride 104  96 - 112 mEq/L   CO2 29  19 - 32 mEq/L   Glucose, Bld 117 (*) 70 - 99 mg/dL   BUN 5 (*) 6 - 23 mg/dL   Creatinine, Ser 7.82  0.50 - 1.10 mg/dL   Calcium 8.7  8.4  - 95.6 mg/dL   GFR calc non Af Amer >90  >90 mL/min   GFR calc Af Amer >90  >90 mL/min     Studies/Results: @RISRSLT24 @  . alvimopan  12 mg Oral BID  . antiseptic oral rinse  15 mL Mouth Rinse q12n4p  . chlorhexidine  15 mL Mouth Rinse BID  . enoxaparin (LOVENOX) injection  40 mg Subcutaneous Q24H     Assessment/Plan: s/p Procedure(s): LYSIS OF ADHESION INCIDENTAL APPENDECTOMY COLOSTOMY REVERSAL  POD #4. Resection and closure of colostomy with stapling device, incidental appendectomy. Stable  Significant ileus and vomiting. Continue entereg  Npo Give supplemental potassium chloride Check belly films. Will need NG tube if continues to vomit. Changed the dilaudid to fentanyl. Hopefully less nausea. Increase ambulation   Check pathology. Final report pending     @PROBHOSP @  LOS: 4 days    Alyssa Robinson M 03/21/2013  . .prob

## 2013-03-22 LAB — CBC
HCT: 32.4 % — ABNORMAL LOW (ref 36.0–46.0)
Hemoglobin: 10.9 g/dL — ABNORMAL LOW (ref 12.0–15.0)
MCH: 32.6 pg (ref 26.0–34.0)
MCHC: 33.6 g/dL (ref 30.0–36.0)
MCV: 97 fL (ref 78.0–100.0)
Platelets: 214 10*3/uL (ref 150–400)
RBC: 3.34 MIL/uL — ABNORMAL LOW (ref 3.87–5.11)
RDW: 13.6 % (ref 11.5–15.5)
WBC: 5.8 10*3/uL (ref 4.0–10.5)

## 2013-03-22 LAB — BASIC METABOLIC PANEL
BUN: 5 mg/dL — ABNORMAL LOW (ref 6–23)
CO2: 24 mEq/L (ref 19–32)
Calcium: 8.4 mg/dL (ref 8.4–10.5)
Chloride: 104 mEq/L (ref 96–112)
Creatinine, Ser: 0.53 mg/dL (ref 0.50–1.10)
GFR calc Af Amer: >90 mL/min (ref >90)
GFR calc non Af Amer: >90 mL/min (ref >90)
Glucose, Bld: 106 mg/dL — ABNORMAL HIGH (ref 70–99)
Potassium: 4.3 mEq/L (ref 3.5–5.1)
Sodium: 137 mEq/L (ref 135–145)

## 2013-03-22 NOTE — Progress Notes (Signed)
5 Days Post-Op  Subjective: Had 4 loose stools and feels much better. No nausea. Ready to try clear liquids again. Ambulating some.  Abdominal x-ray showed mild ileus.Potassium 4.3. Creatinine 0.53. WBC 5800. Hemoglobin stable, 10.9.  Objective: Vital signs in last 24 hours: Temp:  [98.4 F (36.9 C)-99 F (37.2 C)] 98.4 F (36.9 C) (11/29 0500) Pulse Rate:  [80-97] 97 (11/29 0500) Resp:  [16-18] 18 (11/29 0500) BP: (131-153)/(59-80) 131/59 mmHg (11/29 0500) SpO2:  [96 %-97 %] 97 % (11/29 0500) Last BM Date: 03/21/13  Intake/Output from previous day: 11/28 0701 - 11/29 0700 In: 1000 [I.V.:1000] Out: -  Intake/Output this shift:    General appearance: severe distress and looks good. No distress. Mental status normal. Spirits better. Resp: clear to auscultation bilaterally GI: abdomen soft. Still somewhat distended but bowel sounds are present. Wound looks good with negative pressure dressing in place.  Lab Results:  Results for orders placed during the hospital encounter of 03/17/13 (from the past 24 hour(s))  CBC     Status: Abnormal   Collection Time    03/22/13  4:10 AM      Result Value Range   WBC 5.8  4.0 - 10.5 K/uL   RBC 3.34 (*) 3.87 - 5.11 MIL/uL   Hemoglobin 10.9 (*) 12.0 - 15.0 g/dL   HCT 16.1 (*) 09.6 - 04.5 %   MCV 97.0  78.0 - 100.0 fL   MCH 32.6  26.0 - 34.0 pg   MCHC 33.6  30.0 - 36.0 g/dL   RDW 40.9  81.1 - 91.4 %   Platelets 214  150 - 400 K/uL  BASIC METABOLIC PANEL     Status: Abnormal   Collection Time    03/22/13  4:10 AM      Result Value Range   Sodium 137  135 - 145 mEq/L   Potassium 4.3  3.5 - 5.1 mEq/L   Chloride 104  96 - 112 mEq/L   CO2 24  19 - 32 mEq/L   Glucose, Bld 106 (*) 70 - 99 mg/dL   BUN 5 (*) 6 - 23 mg/dL   Creatinine, Ser 7.82  0.50 - 1.10 mg/dL   Calcium 8.4  8.4 - 95.6 mg/dL   GFR calc non Af Amer >90  >90 mL/min   GFR calc Af Amer >90  >90 mL/min     Studies/Results: @RISRSLT24 @  . alvimopan  12 mg Oral BID  .  antiseptic oral rinse  15 mL Mouth Rinse q12n4p  . chlorhexidine  15 mL Mouth Rinse BID  . enoxaparin (LOVENOX) injection  40 mg Subcutaneous Q24H     Assessment/Plan: s/p Procedure(s): LYSIS OF ADHESION INCIDENTAL APPENDECTOMY COLOSTOMY REVERSAL  POD #5. Resection and closure of colostomy with stapling device, incidental appendectomy. Stable  Ileus resolving Continue entereg  Clear liquid diet. She requested that we did not advance at this time. Changed the dilaudid to fentanyl...less nausea.  Increase ambulation   Check pathology. Final report still  pending   @PROBHOSP @  LOS: 5 days    Alyssa Robinson M 03/22/2013  . .prob

## 2013-03-23 NOTE — Progress Notes (Signed)
6 Days Post-Op  Subjective: Feeling better. Having loose stools. Now hungry. Getting ready to try first tray of solid food this morning. Pain under good control. Afebrile. Stable.  Objective: Vital signs in last 24 hours: Temp:  [98 F (36.7 C)-98.6 F (37 C)] 98 F (36.7 C) (11/30 0549) Pulse Rate:  [70-74] 72 (11/30 0549) Resp:  [16-18] 18 (11/30 0549) BP: (113-140)/(52-73) 122/52 mmHg (11/30 0549) SpO2:  [98 %-100 %] 100 % (11/30 0549) Last BM Date: 03/22/13  Intake/Output from previous day: 11/29 0701 - 11/30 0700 In: 2065 [I.V.:2065] Out: -  Intake/Output this shift:    General appearance: alert. Friendly. No distress. Mental status normal. GI: abdomen is soft. Slightly distended. Good bowel sounds. Midline wound and left-sided wound clean without signs of cellulitis. Negative pressure dressing in place.  Lab Results:  No results found for this or any previous visit (from the past 24 hour(s)).   Studies/Results: @RISRSLT24 @  . antiseptic oral rinse  15 mL Mouth Rinse q12n4p  . chlorhexidine  15 mL Mouth Rinse BID  . enoxaparin (LOVENOX) injection  40 mg Subcutaneous Q24H     Assessment/Plan: s/p Procedure(s): LYSIS OF ADHESION INCIDENTAL APPENDECTOMY COLOSTOMY REVERSAL  POD #5. Resection and closure of colostomy with stapling device, incidental appendectomy. Stable  Ileus resolving  D/C entereg  Low-fat diet PO  percocet  Home tomorrow  Check pathology. Final report still pending    @PROBHOSP @  LOS: 6 days    Alyssa Robinson 03/23/2013  . .prob

## 2013-03-24 MED ORDER — OXYCODONE-ACETAMINOPHEN 5-325 MG PO TABS: 1.0000 | ORAL_TABLET | ORAL | Status: DC | PRN

## 2013-03-24 NOTE — Progress Notes (Signed)
Patient discharged to home with instructions. 

## 2013-03-24 NOTE — Discharge Summary (Signed)
Physician Discharge Summary  Patient ID: Alyssa Robinson MRN: [id number] DOB/AGE: 07/04/1950 62 y.o.  Admit date: 03/17/2013 Discharge date: 03/24/2013  Admission Diagnoses:  Discharge Diagnoses:  Active Problems:   * No active hospital problems. *   Discharged Condition: good  Hospital Course: Patient admitted postoperatively after colostomy reversal.  She had a postoperative ileus which resolved.  Pathology was normal postoperative.  She is now having several bowel movements per day and eating a regular diet  Consults: None  Significant Diagnostic Studies: labs: cbc/bmet/KUB  Treatments: IV hydration, antibiotics: Ancef, metronidazole and perioperative antibiotics, analgesia: Percocet and surgery: Colostomy reversal  Discharge Exam: Blood pressure 112/77, pulse 81, temperature 98.5 F (36.9 C), temperature source Oral, resp. rate 16, height 4\' 11"  (1.499 m), weight 48.081 kg (106 lb), SpO2 97.00%. General appearance: alert, cooperative, appears stated age and no distress Resp: clear to auscultation bilaterally GI: soft, non-tender; bowel sounds normal; no masses,  no organomegaly and staples intact underneath VAC dressing.  No evidence of infection  Disposition: 01-Home or Self Care  Discharge Orders   Future Orders Complete By Expires   Call MD for:  difficulty breathing, headache or visual disturbances  As directed    Call MD for:  extreme fatigue  As directed    Call MD for:  hives  As directed    Call MD for:  persistant dizziness or light-headedness  As directed    Call MD for:  persistant nausea and vomiting  As directed    Call MD for:  redness, tenderness, or signs of infection (pain, swelling, redness, odor or green/yellow discharge around incision site)  As directed    Call MD for:  severe uncontrolled pain  As directed    Call MD for:  temperature >100.4  As directed    Diet general  As directed    Comments:     Lots of fruits and vegetables.  Limited pitted  fruits.   Discharge instructions  As directed    Comments:     See additional instructions   Driving Restrictions  As directed    Comments:     No driving for one week   Increase activity slowly  As directed    Lifting restrictions  As directed    Comments:     No lifting greater than 20 pounds for an additional 7 weeks   No dressing needed  As directed    Comments:     Come to my clinic next week to have staples removed.   Sexual Activity Restrictions  As directed    Comments:     Until pain is well controlled       Medication List    STOP taking these medications       erythromycin 250 MG tablet  Commonly known as:  E-MYCIN     neomycin 500 MG tablet  Commonly known as:  MYCIFRADIN      TAKE these medications       acetaminophen 500 MG tablet  Commonly known as:  TYLENOL  Take 500 mg by mouth every 6 (six) hours as needed.     multivitamin animal shapes (with Ca/FA) WITH C & FA Chew chewable tablet  Chew 1 tablet by mouth daily.     oxyCODONE-acetaminophen 5-325 MG per tablet  Commonly known as:  PERCOCET/ROXICET  Take 1-2 tablets by mouth every 4 (four) hours as needed for moderate pain.           Follow-up Information  Follow up with Claus Silvestro, Marta Lamas, MD. Schedule an appointment as soon as possible for a visit on 04/01/2013. (Primarily for staple removal)    Specialty:  General Surgery   Contact information:   [address]  [place] [place] [address] [phone]       Signed: Cherylynn Ridges 03/24/2013, 10:04 AM

## 2013-04-01 ENCOUNTER — Telehealth (INDEPENDENT_AMBULATORY_CARE_PROVIDER_SITE_OTHER): Payer: Self-pay

## 2013-04-01 ENCOUNTER — Ambulatory Visit (INDEPENDENT_AMBULATORY_CARE_PROVIDER_SITE_OTHER): Payer: BC Managed Care – PPO | Admitting: General Surgery

## 2013-04-01 ENCOUNTER — Encounter (INDEPENDENT_AMBULATORY_CARE_PROVIDER_SITE_OTHER): Payer: Self-pay | Admitting: General Surgery

## 2013-04-01 VITALS — BP 100/68 | HR 68 | Temp 97.6°F | Resp 16 | Wt 102.6 lb

## 2013-04-01 DIAGNOSIS — Z09 Encounter for follow-up examination after completed treatment for conditions other than malignant neoplasm: Secondary | ICD-10-CM | POA: Insufficient documentation

## 2013-04-01 NOTE — Progress Notes (Signed)
The patient is status post reversal of colostomy and is doing extremely well. She goes from having had rarely a bowel movement every 2-3 days still having 3-6 bowel movements a day. It is not watery. It is very soft however. She's not taking any laxatives or stool soft.  Her wounds are healing extremely well with no evidence of infection. The patient had a negative pressure incisional dressing postoperatively.Her energy level is good. She has no fevers or chills. She is to return to see me in one month for a final recheck.

## 2013-04-01 NOTE — Telephone Encounter (Signed)
Pt called in although she just saw Dr Lindie Spruce today.  She states she has been having frequent bowel movements and when she wipes there is a little blood this afternoon.  I told her since she is only a week out this is probably normal as she just had an ostomy reversal and she is getting back to normal.  I asked her to call us if it persists or there is a large amt of blood.  She agrees to watch it for now.

## 2013-04-22 ENCOUNTER — Telehealth (INDEPENDENT_AMBULATORY_CARE_PROVIDER_SITE_OTHER): Payer: Self-pay

## 2013-04-22 DIAGNOSIS — K921 Melena: Secondary | ICD-10-CM

## 2013-04-22 LAB — CBC WITH DIFFERENTIAL/PLATELET
Basophils Absolute: 0 10*3/uL (ref 0.0–0.1)
Basophils Relative: 1 % (ref 0–1)
Eosinophils Absolute: 0.1 10*3/uL (ref 0.0–0.7)
Eosinophils Relative: 1 % (ref 0–5)
HCT: 33.6 % — ABNORMAL LOW (ref 36.0–46.0)
Hemoglobin: 11 g/dL — ABNORMAL LOW (ref 12.0–15.0)
Lymphs Abs: 1.6 10*3/uL (ref 0.7–4.0)
Monocytes Absolute: 0.5 10*3/uL (ref 0.1–1.0)
Neutrophils Relative %: 54 % (ref 43–77)
Platelets: 182 10*3/uL (ref 150–400)
RBC: 3.5 MIL/uL — ABNORMAL LOW (ref 3.87–5.11)
RDW: 14.1 % (ref 11.5–15.5)
WBC: 4.8 10*3/uL (ref 4.0–10.5)

## 2013-04-22 NOTE — Telephone Encounter (Signed)
The pt called s/p ostomy reversal 11/24.  She states she has had blood in her stool for the the past couple days or so.  Her stool has been maroon colored and when she wipes there is blood.  She was tired yesterday and has had some night sweats.  I felt that some blood was normal as the part of her healing but I would run this by Dr Lindie Spruce.  She has no thermometer at home but will get one.  She has no pain.

## 2013-04-22 NOTE — Addendum Note (Signed)
Addended by: Brennan Bailey on: 04/22/2013 01:32 PM   Modules accepted: Orders

## 2013-04-22 NOTE — Telephone Encounter (Signed)
LMOM to call back. Dr Lindie Spruce wants her to go to [facility] get get CBC drawn. Please give her this information. Order in epic.

## 2013-04-22 NOTE — Telephone Encounter (Signed)
Patient called back and was given below message.  Patient states understanding and will go have lab drawn.

## 2013-04-23 NOTE — Telephone Encounter (Signed)
Patient called back to report that she is now having loose stools and bright red bleeding with her stools.  Patient states the blood is in the commode and on the tissue paper when she wipes.  Explained that I will send a message to Dr. Lindie Spruce to make him aware but will also ask an in office MD regarding this. Patient states understanding and agreeable at this time.

## 2013-04-23 NOTE — Telephone Encounter (Signed)
Spoke to Dr. Corliss Skains in the office who reviewed patient's records.  He states to have patient just keep an eye on the bleeding but he isn't concerned.  He stated that it is just probably the suture area. Explained this to the patient along with explained that her Hgb was fine.  Patient states understanding and will keep a watch on the bleeding.  Patient states she will call back if the bleeding is the same in a couple of days.

## 2013-04-29 ENCOUNTER — Encounter (INDEPENDENT_AMBULATORY_CARE_PROVIDER_SITE_OTHER): Payer: BC Managed Care – PPO | Admitting: General Surgery

## 2013-05-08 ENCOUNTER — Ambulatory Visit (INDEPENDENT_AMBULATORY_CARE_PROVIDER_SITE_OTHER): Payer: BC Managed Care – PPO | Admitting: General Surgery

## 2013-05-08 ENCOUNTER — Encounter (INDEPENDENT_AMBULATORY_CARE_PROVIDER_SITE_OTHER): Payer: Self-pay | Admitting: General Surgery

## 2013-05-08 VITALS — BP 128/84 | HR 72 | Resp 14 | Ht 59.0 in | Wt 102.8 lb

## 2013-05-08 DIAGNOSIS — Z09 Encounter for follow-up examination after completed treatment for conditions other than malignant neoplasm: Secondary | ICD-10-CM

## 2013-05-08 NOTE — Progress Notes (Signed)
The patient is doing very well status post reversal of colostomy. Her midline wound is well-healed. There is no evidence of any type of abdominal wall hernia. She is eating well although not gaining weight. She is having 2-3 regular bowel movements per day. None of them are very hard.  The patient's concerns are that she just is not gaining weight however she is doing well enough now that I think that as she enters into her usual daily activities she will start gaining weight. There is no reason for her to return to see me on an official basis.  She can return to see me on a when necessary basis.

## 2013-05-20 LAB — HM MAMMOGRAPHY: HM MAMMO: NORMAL

## 2013-07-10 ENCOUNTER — Other Ambulatory Visit (INDEPENDENT_AMBULATORY_CARE_PROVIDER_SITE_OTHER): Payer: BC Managed Care – PPO

## 2013-07-10 ENCOUNTER — Encounter: Payer: Self-pay | Admitting: Internal Medicine

## 2013-07-10 ENCOUNTER — Ambulatory Visit (INDEPENDENT_AMBULATORY_CARE_PROVIDER_SITE_OTHER): Payer: BC Managed Care – PPO | Admitting: Internal Medicine

## 2013-07-10 VITALS — BP 124/82 | HR 70 | Temp 97.8°F | Resp 16 | Ht 59.0 in | Wt 104.0 lb

## 2013-07-10 DIAGNOSIS — D649 Anemia, unspecified: Secondary | ICD-10-CM

## 2013-07-10 DIAGNOSIS — I10 Essential (primary) hypertension: Secondary | ICD-10-CM

## 2013-07-10 LAB — FOLATE: Folate: >24.8 ng/mL

## 2013-07-10 LAB — CBC WITH DIFFERENTIAL/PLATELET
BASOS PCT: 0.5 % (ref 0.0–3.0)
Basophils Absolute: 0 10*3/uL (ref 0.0–0.1)
EOS PCT: 0.9 % (ref 0.0–5.0)
Eosinophils Absolute: 0.1 10*3/uL (ref 0.0–0.7)
HEMATOCRIT: 37.1 % (ref 36.0–46.0)
Hemoglobin: 11.7 g/dL — ABNORMAL LOW (ref 12.0–15.0)
Lymphocytes Relative: 20.9 % (ref 12.0–46.0)
Lymphs Abs: 1.4 10*3/uL (ref 0.7–4.0)
MCHC: 31.5 g/dL (ref 30.0–36.0)
MCV: 80.9 fl (ref 78.0–100.0)
MONO ABS: 0.9 10*3/uL (ref 0.1–1.0)
MONOS PCT: 12.9 % — AB (ref 3.0–12.0)
Neutro Abs: 4.3 10*3/uL (ref 1.4–7.7)
Neutrophils Relative %: 64.8 % (ref 43.0–77.0)
PLATELETS: 225 10*3/uL (ref 150.0–400.0)
RBC: 4.59 Mil/uL (ref 3.87–5.11)
RDW: 18.7 % — ABNORMAL HIGH (ref 11.5–14.6)
WBC: 6.7 10*3/uL (ref 4.5–10.5)

## 2013-07-10 LAB — FERRITIN: FERRITIN: 4.5 ng/mL — AB (ref 10.0–291.0)

## 2013-07-10 LAB — IBC PANEL
Iron: 60 ug/dL (ref 42–145)
Saturation Ratios: 12 % — ABNORMAL LOW (ref 20.0–50.0)
Transferrin: 355.9 mg/dL (ref 212.0–360.0)

## 2013-07-10 NOTE — Assessment & Plan Note (Signed)
Her BP is well controlled 

## 2013-07-10 NOTE — Progress Notes (Signed)
Pre visit review using our clinic review tool, if applicable. No additional management support is needed unless otherwise documented below in the visit note. 

## 2013-07-10 NOTE — Patient Instructions (Signed)

## 2013-07-10 NOTE — Progress Notes (Signed)
   Subjective:    Patient ID: Alyssa Robinson, female    DOB: 12/10/1950, 63 y.o.   MRN: [id number]  Anemia Presents for follow-up visit. There has been no abdominal pain, anorexia, bruising/bleeding easily, confusion, fever, leg swelling, light-headedness, malaise/fatigue, pallor, palpitations, paresthesias, pica or weight loss. Signs of blood loss that are not present include hematemesis, hematochezia, melena and vaginal bleeding. Past treatments include nothing. Past medical history includes recent surgery.      Review of Systems  Constitutional: Negative.  Negative for fever, chills, weight loss, malaise/fatigue, diaphoresis, appetite change and fatigue.  HENT: Negative.   Eyes: Negative.   Respiratory: Negative.  Negative for cough, shortness of breath and stridor.   Cardiovascular: Negative.  Negative for chest pain, palpitations and leg swelling.  Gastrointestinal: Negative.  Negative for nausea, vomiting, abdominal pain, diarrhea, constipation, blood in stool, melena, hematochezia, abdominal distention, anal bleeding, rectal pain, anorexia and hematemesis.  Endocrine: Negative.   Genitourinary: Negative.  Negative for hematuria, decreased urine volume, vaginal bleeding and difficulty urinating.  Musculoskeletal: Negative.   Skin: Negative.  Negative for pallor.  Allergic/Immunologic: Negative.   Neurological: Negative.  Negative for dizziness, light-headedness and paresthesias.  Hematological: Negative.  Negative for adenopathy. Does not bruise/bleed easily.  Psychiatric/Behavioral: Negative.  Negative for confusion.       Objective:   Physical Exam  Vitals reviewed. Constitutional: She is oriented to person, place, and time. She appears well-developed and well-nourished. No distress.  HENT:  Mouth/Throat: Oropharynx is clear and moist. No oropharyngeal exudate.  Eyes: Conjunctivae are normal. Right eye exhibits no discharge. Left eye exhibits no discharge. No scleral icterus.   Neck: Normal range of motion. Neck supple. No JVD present. No tracheal deviation present. No thyromegaly present.  Cardiovascular: Normal rate, regular rhythm, normal heart sounds and intact distal pulses.  Exam reveals no gallop and no friction rub.   No murmur heard. Pulmonary/Chest: Effort normal and breath sounds normal. No stridor. No respiratory distress. She has no wheezes. She has no rales. She exhibits no tenderness.  Abdominal: Soft. Bowel sounds are normal. She exhibits no distension and no mass. There is no tenderness. There is no rebound and no guarding.  Musculoskeletal: Normal range of motion. She exhibits no edema and no tenderness.  Lymphadenopathy:    She has no cervical adenopathy.  Neurological: She is oriented to person, place, and time.  Skin: Skin is warm and dry. No rash noted. She is not diaphoretic. No erythema. No pallor.     Lab Results  Component Value Date   WBC 4.8 04/22/2013   HGB 11.0* 04/22/2013   HCT 33.6* 04/22/2013   PLT 182 04/22/2013   GLUCOSE 106* 03/22/2013   CHOL 206* 12/11/2012   TRIG 112.0 12/11/2012   HDL 68.60 12/11/2012   LDLDIRECT 121.5 12/11/2012   ALT 15 12/11/2012   AST 18 12/11/2012   NA 137 03/22/2013   K 4.3 03/22/2013   CL 104 03/22/2013   CREATININE 0.53 03/22/2013   BUN 5* 03/22/2013   CO2 24 03/22/2013   TSH 1.36 12/11/2012   INR 1.20 10/17/2012   HGBA1C 5.4 12/11/2012       Assessment & Plan:

## 2013-07-10 NOTE — Assessment & Plan Note (Signed)
She appears to be doing well s/p partial colectomy I will recheck her CBC and will also check her iron level

## 2013-12-05 LAB — HM COLONOSCOPY

## 2013-12-08 ENCOUNTER — Telehealth: Payer: Self-pay | Admitting: Internal Medicine

## 2013-12-08 NOTE — Telephone Encounter (Signed)
Received records from [facility] [place] [address] to Dr.Jones,[place]

## 2013-12-22 ENCOUNTER — Encounter (INDEPENDENT_AMBULATORY_CARE_PROVIDER_SITE_OTHER): Payer: Self-pay

## 2014-03-04 ENCOUNTER — Encounter: Payer: Self-pay | Admitting: Internal Medicine

## 2014-03-04 ENCOUNTER — Ambulatory Visit (INDEPENDENT_AMBULATORY_CARE_PROVIDER_SITE_OTHER): Payer: BC Managed Care – PPO | Admitting: Internal Medicine

## 2014-03-04 ENCOUNTER — Other Ambulatory Visit (INDEPENDENT_AMBULATORY_CARE_PROVIDER_SITE_OTHER): Payer: BC Managed Care – PPO

## 2014-03-04 VITALS — BP 136/88 | HR 66 | Temp 97.8°F | Resp 16 | Ht 59.0 in | Wt 108.0 lb

## 2014-03-04 DIAGNOSIS — R312 Other microscopic hematuria: Secondary | ICD-10-CM

## 2014-03-04 DIAGNOSIS — Z0000 Encounter for general adult medical examination without abnormal findings: Secondary | ICD-10-CM

## 2014-03-04 DIAGNOSIS — I10 Essential (primary) hypertension: Secondary | ICD-10-CM

## 2014-03-04 DIAGNOSIS — R3129 Other microscopic hematuria: Secondary | ICD-10-CM

## 2014-03-04 LAB — COMPREHENSIVE METABOLIC PANEL
ALT: 18 U/L (ref 0–35)
AST: 24 U/L (ref 0–37)
Albumin: 3.9 g/dL (ref 3.5–5.2)
Alkaline Phosphatase: 88 U/L (ref 39–117)
BUN: 16 mg/dL (ref 6–23)
CO2: 28 mEq/L (ref 19–32)
CREATININE: 0.7 mg/dL (ref 0.4–1.2)
Calcium: 9.4 mg/dL (ref 8.4–10.5)
Chloride: 105 mEq/L (ref 96–112)
GFR: 87.86 mL/min (ref >60.00)
Glucose, Bld: 97 mg/dL (ref 70–99)
Potassium: 4.6 mEq/L (ref 3.5–5.1)
Sodium: 141 mEq/L (ref 135–145)
Total Bilirubin: 1.2 mg/dL (ref 0.2–1.2)
Total Protein: 7 g/dL (ref 6.0–8.3)

## 2014-03-04 LAB — CBC WITH DIFFERENTIAL/PLATELET
Basophils Absolute: 0 10*3/uL (ref 0.0–0.1)
Basophils Relative: 0.7 % (ref 0.0–3.0)
EOS ABS: 0.1 10*3/uL (ref 0.0–0.7)
Eosinophils Relative: 1.1 % (ref 0.0–5.0)
HCT: 46.3 % — ABNORMAL HIGH (ref 36.0–46.0)
HEMOGLOBIN: 15.4 g/dL — AB (ref 12.0–15.0)
Lymphocytes Relative: 29 % (ref 12.0–46.0)
Lymphs Abs: 1.4 10*3/uL (ref 0.7–4.0)
MCHC: 33.3 g/dL (ref 30.0–36.0)
MCV: 97.8 fl (ref 78.0–100.0)
MONO ABS: 0.7 10*3/uL (ref 0.1–1.0)
Monocytes Relative: 14.3 % — ABNORMAL HIGH (ref 3.0–12.0)
NEUTROS ABS: 2.6 10*3/uL (ref 1.4–7.7)
Neutrophils Relative %: 54.9 % (ref 43.0–77.0)
Platelets: 162 10*3/uL (ref 150.0–400.0)
RBC: 4.74 Mil/uL (ref 3.87–5.11)
RDW: 13.6 % (ref 11.5–15.5)
WBC: 4.7 10*3/uL (ref 4.0–10.5)

## 2014-03-04 LAB — LIPID PANEL
CHOLESTEROL: 197 mg/dL (ref 0–200)
HDL: 71.1 mg/dL (ref >39.00)
LDL Cholesterol: 118 mg/dL — ABNORMAL HIGH (ref 0–99)
NonHDL: 125.9
Total CHOL/HDL Ratio: 3
Triglycerides: 41 mg/dL (ref 0.0–149.0)
VLDL: 8.2 mg/dL (ref 0.0–40.0)

## 2014-03-04 LAB — URINALYSIS, ROUTINE W REFLEX MICROSCOPIC
BILIRUBIN URINE: NEGATIVE
Ketones, ur: NEGATIVE
LEUKOCYTES UA: NEGATIVE
Nitrite: NEGATIVE
PH: 7 (ref 5.0–8.0)
RBC / HPF: NONE SEEN (ref 0–?)
Specific Gravity, Urine: <=1.005 — AB (ref 1.000–1.030)
Total Protein, Urine: NEGATIVE
Urine Glucose: NEGATIVE
Urobilinogen, UA: 0.2 (ref 0.0–1.0)
WBC, UA: NONE SEEN (ref 0–?)

## 2014-03-04 LAB — TSH: TSH: 1.82 u[IU]/mL (ref 0.35–4.50)

## 2014-03-04 NOTE — Progress Notes (Signed)
Pre visit review using our clinic review tool, if applicable. No additional management support is needed unless otherwise documented below in the visit note. 

## 2014-03-04 NOTE — Patient Instructions (Signed)
Preventive Care for Adults A healthy lifestyle and preventive care can promote health and wellness. Preventive health guidelines for women include the following key practices.  A routine yearly physical is a good way to check with your health care provider about your health and preventive screening. It is a chance to share any concerns and updates on your health and to receive a thorough exam.  Visit your dentist for a routine exam and preventive care every 6 months. Brush your teeth twice a day and floss once a day. Good oral hygiene prevents tooth decay and gum disease.  The frequency of eye exams is based on your age, health, family medical history, use of contact lenses, and other factors. Follow your health care provider's recommendations for frequency of eye exams.  Eat a healthy diet. Foods like vegetables, fruits, whole grains, low-fat dairy products, and lean protein foods contain the nutrients you need without too many calories. Decrease your intake of foods high in solid fats, added sugars, and salt. Eat the right amount of calories for you.Get information about a proper diet from your health care provider, if necessary.  Regular physical exercise is one of the most important things you can do for your health. Most adults should get at least 150 minutes of moderate-intensity exercise (any activity that increases your heart rate and causes you to sweat) each week. In addition, most adults need muscle-strengthening exercises on 2 or more days a week.  Maintain a healthy weight. The body mass index (BMI) is a screening tool to identify possible weight problems. It provides an estimate of body fat based on height and weight. Your health care provider can find your BMI and can help you achieve or maintain a healthy weight.For adults 20 years and older:  A BMI below 18.5 is considered underweight.  A BMI of 18.5 to 24.9 is normal.  A BMI of 25 to 29.9 is considered overweight.  A BMI of  30 and above is considered obese.  Maintain normal blood lipids and cholesterol levels by exercising and minimizing your intake of saturated fat. Eat a balanced diet with plenty of fruit and vegetables. Blood tests for lipids and cholesterol should begin at age 35 and be repeated every 5 years. If your lipid or cholesterol levels are high, you are over 50, or you are at high risk for heart disease, you may need your cholesterol levels checked more frequently.Ongoing high lipid and cholesterol levels should be treated with medicines if diet and exercise are not working.  If you smoke, find out from your health care provider how to quit. If you do not use tobacco, do not start.  Lung cancer screening is recommended for adults aged 5-80 years who are at high risk for developing lung cancer because of a history of smoking. A yearly low-dose CT scan of the lungs is recommended for people who have at least a 30-pack-year history of smoking and are a current smoker or have quit within the past 15 years. A pack year of smoking is smoking an average of 1 pack of cigarettes a day for 1 year (for example: 1 pack a day for 30 years or 2 packs a day for 15 years). Yearly screening should continue until the smoker has stopped smoking for at least 15 years. Yearly screening should be stopped for people who develop a health problem that would prevent them from having lung cancer treatment.  If you are pregnant, do not drink alcohol. If you are breastfeeding,  be very cautious about drinking alcohol. If you are not pregnant and choose to drink alcohol, do not have more than 1 drink per day. One drink is considered to be 12 ounces (355 mL) of beer, 5 ounces (148 mL) of wine, or 1.5 ounces (44 mL) of liquor.  Avoid use of street drugs. Do not share needles with anyone. Ask for help if you need support or instructions about stopping the use of drugs.  High blood pressure causes heart disease and increases the risk of  stroke. Your blood pressure should be checked at least every 1 to 2 years. Ongoing high blood pressure should be treated with medicines if weight loss and exercise do not work.  If you are 54-21 years old, ask your health care provider if you should take aspirin to prevent strokes.  Diabetes screening involves taking a blood sample to check your fasting blood sugar level. This should be done once every 3 years, after age 43, if you are within normal weight and without risk factors for diabetes. Testing should be considered at a younger age or be carried out more frequently if you are overweight and have at least 1 risk factor for diabetes.  Breast cancer screening is essential preventive care for women. You should practice "breast self-awareness." This means understanding the normal appearance and feel of your breasts and may include breast self-examination. Any changes detected, no matter how small, should be reported to a health care provider. Women in their 58s and 30s should have a clinical breast exam (CBE) by a health care provider as part of a regular health exam every 1 to 3 years. After age 86, women should have a CBE every year. Starting at age 10, women should consider having a mammogram (breast X-ray test) every year. Women who have a family history of breast cancer should talk to their health care provider about genetic screening. Women at a high risk of breast cancer should talk to their health care providers about having an MRI and a mammogram every year.  Breast cancer gene (BRCA)-related cancer risk assessment is recommended for women who have family members with BRCA-related cancers. BRCA-related cancers include breast, ovarian, tubal, and peritoneal cancers. Having family members with these cancers may be associated with an increased risk for harmful changes (mutations) in the breast cancer genes BRCA1 and BRCA2. Results of the assessment will determine the need for genetic counseling and  BRCA1 and BRCA2 testing.  Routine pelvic exams to screen for cancer are no longer recommended for nonpregnant women who are considered low risk for cancer of the pelvic organs (ovaries, uterus, and vagina) and who do not have symptoms. Ask your health care provider if a screening pelvic exam is right for you.  If you have had past treatment for cervical cancer or a condition that could lead to cancer, you need Pap tests and screening for cancer for at least 20 years after your treatment. If Pap tests have been discontinued, your risk factors (such as having a new sexual partner) need to be reassessed to determine if screening should be resumed. Some women have medical problems that increase the chance of getting cervical cancer. In these cases, your health care provider may recommend more frequent screening and Pap tests.  The HPV test is an additional test that may be used for cervical cancer screening. The HPV test looks for the virus that can cause the cell changes on the cervix. The cells collected during the Pap test can be  tested for HPV. The HPV test could be used to screen women aged 30 years and older, and should be used in women of any age who have unclear Pap test results. After the age of 30, women should have HPV testing at the same frequency as a Pap test.  Colorectal cancer can be detected and often prevented. Most routine colorectal cancer screening begins at the age of 50 years and continues through age 75 years. However, your health care provider may recommend screening at an earlier age if you have risk factors for colon cancer. On a yearly basis, your health care provider may provide home test kits to check for hidden blood in the stool. Use of a small camera at the end of a tube, to directly examine the colon (sigmoidoscopy or colonoscopy), can detect the earliest forms of colorectal cancer. Talk to your health care provider about this at age 50, when routine screening begins. Direct  exam of the colon should be repeated every 5-10 years through age 75 years, unless early forms of pre-cancerous polyps or small growths are found.  People who are at an increased risk for hepatitis B should be screened for this virus. You are considered at high risk for hepatitis B if:  You were born in a country where hepatitis B occurs often. Talk with your health care provider about which countries are considered high risk.  Your parents were born in a high-risk country and you have not received a shot to protect against hepatitis B (hepatitis B vaccine).  You have HIV or AIDS.  You use needles to inject street drugs.  You live with, or have sex with, someone who has hepatitis B.  You get hemodialysis treatment.  You take certain medicines for conditions like cancer, organ transplantation, and autoimmune conditions.  Hepatitis C blood testing is recommended for all people born from 1945 through 1965 and any individual with known risks for hepatitis C.  Practice safe sex. Use condoms and avoid high-risk sexual practices to reduce the spread of sexually transmitted infections (STIs). STIs include gonorrhea, chlamydia, syphilis, trichomonas, herpes, HPV, and human immunodeficiency virus (HIV). Herpes, HIV, and HPV are viral illnesses that have no cure. They can result in disability, cancer, and death.  You should be screened for sexually transmitted illnesses (STIs) including gonorrhea and chlamydia if:  You are sexually active and are younger than 24 years.  You are older than 24 years and your health care provider tells you that you are at risk for this type of infection.  Your sexual activity has changed since you were last screened and you are at an increased risk for chlamydia or gonorrhea. Ask your health care provider if you are at risk.  If you are at risk of being infected with HIV, it is recommended that you take a prescription medicine daily to prevent HIV infection. This is  called preexposure prophylaxis (PrEP). You are considered at risk if:  You are a heterosexual woman, are sexually active, and are at increased risk for HIV infection.  You take drugs by injection.  You are sexually active with a partner who has HIV.  Talk with your health care provider about whether you are at high risk of being infected with HIV. If you choose to begin PrEP, you should first be tested for HIV. You should then be tested every 3 months for as long as you are taking PrEP.  Osteoporosis is a disease in which the bones lose minerals and strength   with aging. This can result in serious bone fractures or breaks. The risk of osteoporosis can be identified using a bone density scan. Women ages 65 years and over and women at risk for fractures or osteoporosis should discuss screening with their health care providers. Ask your health care provider whether you should take a calcium supplement or vitamin D to reduce the rate of osteoporosis.  Menopause can be associated with physical symptoms and risks. Hormone replacement therapy is available to decrease symptoms and risks. You should talk to your health care provider about whether hormone replacement therapy is right for you.  Use sunscreen. Apply sunscreen liberally and repeatedly throughout the day. You should seek shade when your shadow is shorter than you. Protect yourself by wearing long sleeves, pants, a wide-brimmed hat, and sunglasses year round, whenever you are outdoors.  Once a month, do a whole body skin exam, using a mirror to look at the skin on your back. Tell your health care provider of new moles, moles that have irregular borders, moles that are larger than a pencil eraser, or moles that have changed in shape or color.  Stay current with required vaccines (immunizations).  Influenza vaccine. All adults should be immunized every year.  Tetanus, diphtheria, and acellular pertussis (Td, Tdap) vaccine. Pregnant women should  receive 1 dose of Tdap vaccine during each pregnancy. The dose should be obtained regardless of the length of time since the last dose. Immunization is preferred during the 27th-36th week of gestation. An adult who has not previously received Tdap or who does not know her vaccine status should receive 1 dose of Tdap. This initial dose should be followed by tetanus and diphtheria toxoids (Td) booster doses every 10 years. Adults with an unknown or incomplete history of completing a 3-dose immunization series with Td-containing vaccines should begin or complete a primary immunization series including a Tdap dose. Adults should receive a Td booster every 10 years.  Varicella vaccine. An adult without evidence of immunity to varicella should receive 2 doses or a second dose if she has previously received 1 dose. Pregnant females who do not have evidence of immunity should receive the first dose after pregnancy. This first dose should be obtained before leaving the health care facility. The second dose should be obtained 4-8 weeks after the first dose.  Human papillomavirus (HPV) vaccine. Females aged 13-26 years who have not received the vaccine previously should obtain the 3-dose series. The vaccine is not recommended for use in pregnant females. However, pregnancy testing is not needed before receiving a dose. If a female is found to be pregnant after receiving a dose, no treatment is needed. In that case, the remaining doses should be delayed until after the pregnancy. Immunization is recommended for any person with an immunocompromised condition through the age of 26 years if she did not get any or all doses earlier. During the 3-dose series, the second dose should be obtained 4-8 weeks after the first dose. The third dose should be obtained 24 weeks after the first dose and 16 weeks after the second dose.  Zoster vaccine. One dose is recommended for adults aged 60 years or older unless certain conditions are  present.  Measles, mumps, and rubella (MMR) vaccine. Adults born before 1957 generally are considered immune to measles and mumps. Adults born in 1957 or later should have 1 or more doses of MMR vaccine unless there is a contraindication to the vaccine or there is laboratory evidence of immunity to   each of the three diseases. A routine second dose of MMR vaccine should be obtained at least 28 days after the first dose for students attending postsecondary schools, health care workers, or international travelers. People who received inactivated measles vaccine or an unknown type of measles vaccine during 1963-1967 should receive 2 doses of MMR vaccine. People who received inactivated mumps vaccine or an unknown type of mumps vaccine before 1979 and are at high risk for mumps infection should consider immunization with 2 doses of MMR vaccine. For females of childbearing age, rubella immunity should be determined. If there is no evidence of immunity, females who are not pregnant should be vaccinated. If there is no evidence of immunity, females who are pregnant should delay immunization until after pregnancy. Unvaccinated health care workers born before 1957 who lack laboratory evidence of measles, mumps, or rubella immunity or laboratory confirmation of disease should consider measles and mumps immunization with 2 doses of MMR vaccine or rubella immunization with 1 dose of MMR vaccine.  Pneumococcal 13-valent conjugate (PCV13) vaccine. When indicated, a person who is uncertain of her immunization history and has no record of immunization should receive the PCV13 vaccine. An adult aged 19 years or older who has certain medical conditions and has not been previously immunized should receive 1 dose of PCV13 vaccine. This PCV13 should be followed with a dose of pneumococcal polysaccharide (PPSV23) vaccine. The PPSV23 vaccine dose should be obtained at least 8 weeks after the dose of PCV13 vaccine. An adult aged 19  years or older who has certain medical conditions and previously received 1 or more doses of PPSV23 vaccine should receive 1 dose of PCV13. The PCV13 vaccine dose should be obtained 1 or more years after the last PPSV23 vaccine dose.  Pneumococcal polysaccharide (PPSV23) vaccine. When PCV13 is also indicated, PCV13 should be obtained first. All adults aged 65 years and older should be immunized. An adult younger than age 65 years who has certain medical conditions should be immunized. Any person who resides in a nursing home or long-term care facility should be immunized. An adult smoker should be immunized. People with an immunocompromised condition and certain other conditions should receive both PCV13 and PPSV23 vaccines. People with human immunodeficiency virus (HIV) infection should be immunized as soon as possible after diagnosis. Immunization during chemotherapy or radiation therapy should be avoided. Routine use of PPSV23 vaccine is not recommended for American Indians, [place] Natives, or people younger than 65 years unless there are medical conditions that require PPSV23 vaccine. When indicated, people who have unknown immunization and have no record of immunization should receive PPSV23 vaccine. One-time revaccination 5 years after the first dose of PPSV23 is recommended for people aged 19-64 years who have chronic kidney failure, nephrotic syndrome, asplenia, or immunocompromised conditions. People who received 1-2 doses of PPSV23 before age 65 years should receive another dose of PPSV23 vaccine at age 65 years or later if at least 5 years have passed since the previous dose. Doses of PPSV23 are not needed for people immunized with PPSV23 at or after age 65 years.  Meningococcal vaccine. Adults with asplenia or persistent complement component deficiencies should receive 2 doses of quadrivalent meningococcal conjugate (MenACWY-D) vaccine. The doses should be obtained at least 2 months apart.  Microbiologists working with certain meningococcal bacteria, military recruits, people at risk during an outbreak, and people who travel to or live in countries with a high rate of meningitis should be immunized. A first-year college student up through age   21 years who is living in a residence hall should receive a dose if she did not receive a dose on or after her 16th birthday. Adults who have certain high-risk conditions should receive one or more doses of vaccine.  Hepatitis A vaccine. Adults who wish to be protected from this disease, have certain high-risk conditions, work with hepatitis A-infected animals, work in hepatitis A research labs, or travel to or work in countries with a high rate of hepatitis A should be immunized. Adults who were previously unvaccinated and who anticipate close contact with an international adoptee during the first 60 days after arrival in the [place] from a country with a high rate of hepatitis A should be immunized.  Hepatitis B vaccine. Adults who wish to be protected from this disease, have certain high-risk conditions, may be exposed to blood or other infectious body fluids, are household contacts or sex partners of hepatitis B positive people, are clients or workers in certain care facilities, or travel to or work in countries with a high rate of hepatitis B should be immunized.  Haemophilus influenzae type b (Hib) vaccine. A previously unvaccinated person with asplenia or sickle cell disease or having a scheduled splenectomy should receive 1 dose of Hib vaccine. Regardless of previous immunization, a recipient of a hematopoietic stem cell transplant should receive a 3-dose series 6-12 months after her successful transplant. Hib vaccine is not recommended for adults with HIV infection. Preventive Services / Frequency Ages 64 to 68 years  Blood pressure check.** / Every 1 to 2 years.  Lipid and cholesterol check.** / Every 5 years beginning at age  22.  Clinical breast exam.** / Every 3 years for women in their 88s and 53s.  BRCA-related cancer risk assessment.** / For women who have family members with a BRCA-related cancer (breast, ovarian, tubal, or peritoneal cancers).  Pap test.** / Every 2 years from ages 90 through 51. Every 3 years starting at age 21 through age 56 or 3 with a history of 3 consecutive normal Pap tests.  HPV screening.** / Every 3 years from ages 24 through ages 1 to 46 with a history of 3 consecutive normal Pap tests.  Hepatitis C blood test.** / For any individual with known risks for hepatitis C.  Skin self-exam. / Monthly.  Influenza vaccine. / Every year.  Tetanus, diphtheria, and acellular pertussis (Tdap, Td) vaccine.** / Consult your health care provider. Pregnant women should receive 1 dose of Tdap vaccine during each pregnancy. 1 dose of Td every 10 years.  Varicella vaccine.** / Consult your health care provider. Pregnant females who do not have evidence of immunity should receive the first dose after pregnancy.  HPV vaccine. / 3 doses over 6 months, if 72 and younger. The vaccine is not recommended for use in pregnant females. However, pregnancy testing is not needed before receiving a dose.  Measles, mumps, rubella (MMR) vaccine.** / You need at least 1 dose of MMR if you were born in 1957 or later. You may also need a 2nd dose. For females of childbearing age, rubella immunity should be determined. If there is no evidence of immunity, females who are not pregnant should be vaccinated. If there is no evidence of immunity, females who are pregnant should delay immunization until after pregnancy.  Pneumococcal 13-valent conjugate (PCV13) vaccine.** / Consult your health care provider.  Pneumococcal polysaccharide (PPSV23) vaccine.** / 1 to 2 doses if you smoke cigarettes or if you have certain conditions.  Meningococcal vaccine.** /  1 dose if you are age 19 to 21 years and a first-year college  student living in a residence hall, or have one of several medical conditions, you need to get vaccinated against meningococcal disease. You may also need additional booster doses.  Hepatitis A vaccine.** / Consult your health care provider.  Hepatitis B vaccine.** / Consult your health care provider.  Haemophilus influenzae type b (Hib) vaccine.** / Consult your health care provider. Ages 40 to 64 years  Blood pressure check.** / Every 1 to 2 years.  Lipid and cholesterol check.** / Every 5 years beginning at age 20 years.  Lung cancer screening. / Every year if you are aged 55-80 years and have a 30-pack-year history of smoking and currently smoke or have quit within the past 15 years. Yearly screening is stopped once you have quit smoking for at least 15 years or develop a health problem that would prevent you from having lung cancer treatment.  Clinical breast exam.** / Every year after age 40 years.  BRCA-related cancer risk assessment.** / For women who have family members with a BRCA-related cancer (breast, ovarian, tubal, or peritoneal cancers).  Mammogram.** / Every year beginning at age 40 years and continuing for as long as you are in good health. Consult with your health care provider.  Pap test.** / Every 3 years starting at age 30 years through age 65 or 70 years with a history of 3 consecutive normal Pap tests.  HPV screening.** / Every 3 years from ages 30 years through ages 65 to 70 years with a history of 3 consecutive normal Pap tests.  Fecal occult blood test (FOBT) of stool. / Every year beginning at age 50 years and continuing until age 75 years. You may not need to do this test if you get a colonoscopy every 10 years.  Flexible sigmoidoscopy or colonoscopy.** / Every 5 years for a flexible sigmoidoscopy or every 10 years for a colonoscopy beginning at age 50 years and continuing until age 75 years.  Hepatitis C blood test.** / For all people born from 1945 through  1965 and any individual with known risks for hepatitis C.  Skin self-exam. / Monthly.  Influenza vaccine. / Every year.  Tetanus, diphtheria, and acellular pertussis (Tdap/Td) vaccine.** / Consult your health care provider. Pregnant women should receive 1 dose of Tdap vaccine during each pregnancy. 1 dose of Td every 10 years.  Varicella vaccine.** / Consult your health care provider. Pregnant females who do not have evidence of immunity should receive the first dose after pregnancy.  Zoster vaccine.** / 1 dose for adults aged 60 years or older.  Measles, mumps, rubella (MMR) vaccine.** / You need at least 1 dose of MMR if you were born in 1957 or later. You may also need a 2nd dose. For females of childbearing age, rubella immunity should be determined. If there is no evidence of immunity, females who are not pregnant should be vaccinated. If there is no evidence of immunity, females who are pregnant should delay immunization until after pregnancy.  Pneumococcal 13-valent conjugate (PCV13) vaccine.** / Consult your health care provider.  Pneumococcal polysaccharide (PPSV23) vaccine.** / 1 to 2 doses if you smoke cigarettes or if you have certain conditions.  Meningococcal vaccine.** / Consult your health care provider.  Hepatitis A vaccine.** / Consult your health care provider.  Hepatitis B vaccine.** / Consult your health care provider.  Haemophilus influenzae type b (Hib) vaccine.** / Consult your health care provider. Ages 65   years and over  Blood pressure check.** / Every 1 to 2 years.  Lipid and cholesterol check.** / Every 5 years beginning at age 22 years.  Lung cancer screening. / Every year if you are aged 73-80 years and have a 30-pack-year history of smoking and currently smoke or have quit within the past 15 years. Yearly screening is stopped once you have quit smoking for at least 15 years or develop a health problem that would prevent you from having lung cancer  treatment.  Clinical breast exam.** / Every year after age 4 years.  BRCA-related cancer risk assessment.** / For women who have family members with a BRCA-related cancer (breast, ovarian, tubal, or peritoneal cancers).  Mammogram.** / Every year beginning at age 40 years and continuing for as long as you are in good health. Consult with your health care provider.  Pap test.** / Every 3 years starting at age 9 years through age 34 or 91 years with 3 consecutive normal Pap tests. Testing can be stopped between 65 and 70 years with 3 consecutive normal Pap tests and no abnormal Pap or HPV tests in the past 10 years.  HPV screening.** / Every 3 years from ages 57 years through ages 64 or 45 years with a history of 3 consecutive normal Pap tests. Testing can be stopped between 65 and 70 years with 3 consecutive normal Pap tests and no abnormal Pap or HPV tests in the past 10 years.  Fecal occult blood test (FOBT) of stool. / Every year beginning at age 15 years and continuing until age 17 years. You may not need to do this test if you get a colonoscopy every 10 years.  Flexible sigmoidoscopy or colonoscopy.** / Every 5 years for a flexible sigmoidoscopy or every 10 years for a colonoscopy beginning at age 86 years and continuing until age 71 years.  Hepatitis C blood test.** / For all people born from [birth year] through 1966 and any individual with known risks for hepatitis C.  Osteoporosis screening.** / A one-time screening for women ages 83 years and over and women at risk for fractures or osteoporosis.  Skin self-exam. / Monthly.  Influenza vaccine. / Every year.  Tetanus, diphtheria, and acellular pertussis (Tdap/Td) vaccine.** / 1 dose of Td every 10 years.  Varicella vaccine.** / Consult your health care provider.  Zoster vaccine.** / 1 dose for adults aged 61 years or older.  Pneumococcal 13-valent conjugate (PCV13) vaccine.** / Consult your health care provider.  Pneumococcal  polysaccharide (PPSV23) vaccine.** / 1 dose for all adults aged 28 years and older.  Meningococcal vaccine.** / Consult your health care provider.  Hepatitis A vaccine.** / Consult your health care provider.  Hepatitis B vaccine.** / Consult your health care provider.  Haemophilus influenzae type b (Hib) vaccine.** / Consult your health care provider. ** Family history and personal history of risk and conditions may change your health care provider's recommendations. Document Released: 06/06/2001 Document Revised: 08/25/2013 Document Reviewed: 09/05/2010 [facility] Patient Information 2015 [place], [place]. This information is not intended to replace advice given to you by your health care provider. Make sure you discuss any questions you have with your health care provider.

## 2014-03-06 NOTE — Progress Notes (Signed)
Subjective:    Patient ID: Alyssa Robinson, female    DOB: 1950-07-16, 63 y.o.   MRN: [id number]  Hypertension This is a chronic problem. The current episode started more than 1 year ago. The problem has been gradually worsening since onset. The problem is uncontrolled. Pertinent negatives include no anxiety, blurred vision, chest pain, headaches, malaise/fatigue, neck pain, orthopnea, palpitations, peripheral edema, PND, shortness of breath or sweats. There are no associated agents to hypertension. There are no known risk factors for coronary artery disease. Past treatments include nothing. The current treatment provides mild improvement. There are no compliance problems.       Review of Systems  Constitutional: Negative.  Negative for fever, chills, malaise/fatigue, diaphoresis, appetite change and fatigue.  HENT: Negative.   Eyes: Negative.  Negative for blurred vision.  Respiratory: Negative.  Negative for cough, choking, chest tightness, shortness of breath and stridor.   Cardiovascular: Negative.  Negative for chest pain, palpitations, orthopnea, leg swelling and PND.  Gastrointestinal: Negative.  Negative for nausea, vomiting, abdominal pain, diarrhea, constipation and blood in stool.  Endocrine: Negative.   Genitourinary: Negative.  Negative for dysuria, hematuria, flank pain, enuresis and difficulty urinating.  Musculoskeletal: Negative.  Negative for back pain, arthralgias and neck pain.  Skin: Negative.  Negative for rash.  Allergic/Immunologic: Negative.   Neurological: Negative.  Negative for dizziness, tremors, seizures, weakness, light-headedness, numbness and headaches.  Hematological: Negative.  Negative for adenopathy. Does not bruise/bleed easily.  Psychiatric/Behavioral: Negative.        Objective:   Physical Exam  Constitutional: She is oriented to person, place, and time. She appears well-developed and well-nourished. No distress.  HENT:  Head: Normocephalic and  atraumatic.  Mouth/Throat: Oropharynx is clear and moist. No oropharyngeal exudate.  Eyes: Conjunctivae are normal. Right eye exhibits no discharge. Left eye exhibits no discharge. No scleral icterus.  Neck: Normal range of motion. Neck supple. No JVD present. No tracheal deviation present. No thyromegaly present.  Cardiovascular: Normal rate, regular rhythm, normal heart sounds and intact distal pulses.  Exam reveals no gallop and no friction rub.   No murmur heard. Pulmonary/Chest: Effort normal and breath sounds normal. No stridor. No respiratory distress. She has no wheezes. She has no rales. She exhibits no tenderness.  Abdominal: Soft. Bowel sounds are normal. She exhibits no distension and no mass. There is no tenderness. There is no rebound and no guarding.  Musculoskeletal: Normal range of motion. She exhibits no edema or tenderness.  Lymphadenopathy:    She has no cervical adenopathy.  Neurological: She is oriented to person, place, and time.  Skin: Skin is warm and dry. No rash noted. She is not diaphoretic. No erythema. No pallor.  Psychiatric: She has a normal mood and affect. Her behavior is normal. Judgment and thought content normal.  Vitals reviewed.   Lab Results  Component Value Date   WBC 4.7 03/04/2014   HGB 15.4* 03/04/2014   HCT 46.3* 03/04/2014   PLT 162.0 03/04/2014   GLUCOSE 97 03/04/2014   CHOL 197 03/04/2014   TRIG 41.0 03/04/2014   HDL 71.10 03/04/2014   LDLDIRECT 121.5 12/11/2012   LDLCALC 118* 03/04/2014   ALT 18 03/04/2014   AST 24 03/04/2014   NA 141 03/04/2014   K 4.6 03/04/2014   CL 105 03/04/2014   CREATININE 0.7 03/04/2014   BUN 16 03/04/2014   CO2 28 03/04/2014   TSH 1.82 03/04/2014   INR 1.20 10/17/2012   HGBA1C 5.4 12/11/2012  Assessment & Plan:

## 2014-03-06 NOTE — Assessment & Plan Note (Signed)
She has hematuria and an elevated H and H Will get a renal scan done to see if there is a mass

## 2014-03-06 NOTE — Assessment & Plan Note (Signed)
Exam done Vaccines were reviewed and updated Labs ordered Pt ed material was given 

## 2014-03-06 NOTE — Assessment & Plan Note (Signed)
Her BP is well controlled 

## 2014-03-11 DIAGNOSIS — H40003 Preglaucoma, unspecified, bilateral: Secondary | ICD-10-CM | POA: Insufficient documentation

## 2014-03-11 DIAGNOSIS — H2513 Age-related nuclear cataract, bilateral: Secondary | ICD-10-CM | POA: Insufficient documentation

## 2014-03-16 ENCOUNTER — Ambulatory Visit (INDEPENDENT_AMBULATORY_CARE_PROVIDER_SITE_OTHER)
Admission: RE | Admit: 2014-03-16 | Discharge: 2014-03-16 | Disposition: A | Discharge status: Home / Self Care | Payer: BC Managed Care – PPO | Source: Ambulatory Visit | Attending: Internal Medicine | Admitting: Internal Medicine

## 2014-03-16 DIAGNOSIS — R312 Other microscopic hematuria: Secondary | ICD-10-CM

## 2014-03-16 DIAGNOSIS — R3129 Other microscopic hematuria: Secondary | ICD-10-CM

## 2014-03-16 MED ORDER — IOHEXOL 300 MG/ML  SOLN
125.0000 mL | Freq: Once | INTRAMUSCULAR | Status: AC | PRN
  Administered 2014-03-16: 115 mL via INTRAVENOUS

## 2014-03-17 ENCOUNTER — Encounter: Payer: Self-pay | Admitting: Internal Medicine

## 2014-05-13 ENCOUNTER — Ambulatory Visit (INDEPENDENT_AMBULATORY_CARE_PROVIDER_SITE_OTHER): Payer: Medicare Other | Admitting: Internal Medicine

## 2014-05-13 ENCOUNTER — Other Ambulatory Visit (INDEPENDENT_AMBULATORY_CARE_PROVIDER_SITE_OTHER): Payer: Medicare Other

## 2014-05-13 ENCOUNTER — Encounter: Payer: Self-pay | Admitting: Internal Medicine

## 2014-05-13 VITALS — BP 138/88 | HR 75 | Temp 97.7°F | Resp 16 | Ht 59.0 in | Wt 111.0 lb

## 2014-05-13 DIAGNOSIS — Z23 Encounter for immunization: Secondary | ICD-10-CM

## 2014-05-13 DIAGNOSIS — D751 Secondary polycythemia: Secondary | ICD-10-CM | POA: Insufficient documentation

## 2014-05-13 DIAGNOSIS — I10 Essential (primary) hypertension: Secondary | ICD-10-CM

## 2014-05-13 DIAGNOSIS — R3129 Other microscopic hematuria: Secondary | ICD-10-CM

## 2014-05-13 DIAGNOSIS — R312 Other microscopic hematuria: Secondary | ICD-10-CM | POA: Diagnosis not present

## 2014-05-13 LAB — CBC WITH DIFFERENTIAL/PLATELET
BASOS PCT: 0.7 % (ref 0.0–3.0)
Basophils Absolute: 0 10*3/uL (ref 0.0–0.1)
Eosinophils Absolute: 0 10*3/uL (ref 0.0–0.7)
Eosinophils Relative: 0.9 % (ref 0.0–5.0)
HCT: 42.6 % (ref 36.0–46.0)
Hemoglobin: 14.7 g/dL (ref 12.0–15.0)
LYMPHS PCT: 23.9 % (ref 12.0–46.0)
Lymphs Abs: 1.1 10*3/uL (ref 0.7–4.0)
MCHC: 34.4 g/dL (ref 30.0–36.0)
MCV: 95.3 fl (ref 78.0–100.0)
Monocytes Absolute: 0.5 10*3/uL (ref 0.1–1.0)
Monocytes Relative: 10.7 % (ref 3.0–12.0)
NEUTROS ABS: 3 10*3/uL (ref 1.4–7.7)
NEUTROS PCT: 63.8 % (ref 43.0–77.0)
Platelets: 157 10*3/uL (ref 150.0–400.0)
RBC: 4.48 Mil/uL (ref 3.87–5.11)
RDW: 13.6 % (ref 11.5–15.5)
WBC: 4.7 10*3/uL (ref 4.0–10.5)

## 2014-05-13 NOTE — Assessment & Plan Note (Signed)
Her BP is well controlled 

## 2014-05-13 NOTE — Assessment & Plan Note (Signed)
She does not have any risk factors for this and her CT was negative for a renal mass that would produce erythropoeitin. She may have simply been dehydrated, will recheck her CBC today and if H/H continues to be high then will consider a referral to hematology.

## 2014-05-13 NOTE — Progress Notes (Signed)
Pre visit review using our clinic review tool, if applicable. No additional management support is needed unless otherwise documented below in the visit note. 

## 2014-05-13 NOTE — Addendum Note (Signed)
Addended by: Estell Harpin T on: 05/13/2014 02:10 PM   Modules accepted: Orders, SmartSet

## 2014-05-13 NOTE — Patient Instructions (Signed)
Complete Blood Count A complete blood count is a group of tests that measures several characteristics of the three types of cells in your blood. The liquid portion of your blood (plasma) is not used in these tests. Irregularities found in results from these tests can indicate different conditions, such as anemia, infections, bleeding problems, and cancers. The blood tests included in a complete blood count can be broken down into the cell types that they examine and what they measure:   White blood cells.  White blood cell count. This is a measurement of the number of white blood cells in a standard volume (concentration) in your blood sample.  White blood cell differential. This identifies the types of white blood cells and the concentration of each in the sample of your blood. There are five different types of white blood cells. They all help you fight infection but in different ways. The differential also identifies immature white blood cells.  Red blood cells.  Red blood cell count. This is a measurement of the concentration of red blood cells in your blood sample.  Hemoglobin. This is a measurement of the amount of hemoglobin in the sample of your blood. This measurement indicates your blood's overall oxygen-carrying capacity.  Hematocrit. This is a measurement of the percentage of space that the red blood cells take up in your blood sample.  Mean corpuscular volume. This is a measurement of the average size of your red blood cells.  Mean corpuscular hemoglobin. This is a measurement of the average amount of hemoglobin inside each of your red blood cells.  Mean corpuscular hemoglobin concentration. This is a calculation of the average concentration of hemoglobin inside each of your red blood cells in your blood sample.  Red blood cell distribution width. This is a measurement of the variation in the size of your red blood cells.  Platelets.  The platelet count. This is a measurement  of the concentration of platelets in your blood sample.  Mean platelet volume. This is a measurement of the average size of the platelets in your blood sample. RESULTS It is your responsibility to obtain your test results. Ask the laboratory or department performing the test when and how you will get your results. Contact your health care provider to discuss any questions you have about your results. Results outside of normal ranges can be an indication of an illness. Examples of abnormal results and possible causes are listed as follows:   White blood cells.  An abnormally low concentration of white blood cells can be caused by certain infections and by conditions that interfere with white blood cell production that happens in the inner part of your bone (bone marrow).  An abnormally high concentration of white blood cells often indicates infections and conditions that cause inflammation. It can also be an indication of blood-related cancer.  Immature white blood cells can indicate an infection or an abnormal condition affecting your bone marrow.  Red blood cells.  An abnormally low concentration of red blood cells, hemoglobin, or hematocrit is called anemia.  An abnormally high concentration of red blood cells, hemoglobin, or hematocrit is called polycythemia. Abnormally high levels of red blood cells can indicate mild thalassemia. Thalassemia is a type of anemia that is passed down through families (hereditary).  When your mean corpuscular volume is abnormally low, your red blood cells are smaller than normal. This can be caused by thalassemia or iron deficiency anemia. Iron deficiency anemia is a type of anemia that is the  result of a deficiency of a nutrient (deficiency anemia). In this case, the nutrient is iron.  An abnormally high mean corpuscular volume means your red blood cells are larger than normal. This can indicate a deficiency anemia caused by a lack of vitamin B12. An  abnormally high mean corpuscular volume also can be caused by a lot of new red blood cells in your blood. This can happen after you have suddenly lost a lot of blood.  Abnormally low mean corpuscular hemoglobin concentration can indicate conditions in which your hemoglobin is abnormally diluted inside the red cells. Examples of these conditions are iron deficiency anemia and thalassemia.  An abnormally high mean corpuscular hemoglobin concentration can indicate the presence of certain hemolytic anemias. Hemolytic anemia is anemia that results from the abnormal breakdown of your red blood cells.  Red cell distribution width is abnormally increased in certain anemias, when new red blood cells are produced after acute blood loss, and with severe thalassemia.  Platelets.  An abnormally low concentration of platelets can be a sign of a bleeding disorder.  An abnormally high concentration of platelets can occur with iron deficiency anemia, inflammatory disorders, and cancers, or result from physical stresses, such as exercise or blood loss. However, it may also be a sign of a clotting disorder.  New platelets are larger than old platelets. An abnormally high mean platelet volume occurs with a large increase in the number of new platelets being produced by your bone marrow. This can happen after the loss of a large amount of blood or the destruction of your platelets by antibodies. An abnormally high mean platelet volume also can occur with certain bone marrow cancers. Document Released: 05/13/2004 Document Revised: 08/25/2013 Document Reviewed: 08/23/2011 [facility] Patient Information 2015 [place], [place]. This information is not intended to replace advice given to you by your health care provider. Make sure you discuss any questions you have with your health care provider.

## 2014-05-13 NOTE — Assessment & Plan Note (Addendum)
Her CT showed a very small rt punctate calculous that may be causing the blood in the urine She does not have any s/s related to this, will follow for now

## 2014-05-13 NOTE — Progress Notes (Signed)
   Subjective:    Patient ID: Alyssa Robinson, female    DOB: 09-19-1950, 64 y.o.   MRN: [id number]  HPI  She returns for repeat CBC regarding an elevated H/H and hematuria  from 2 months ago. 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 She feels well and offers no complaints.   Review of Systems  Constitutional: Negative.  Negative for fever, chills, diaphoresis, appetite change and fatigue.  HENT: Negative.   Eyes: Negative.   Respiratory: Negative.  Negative for cough, choking, chest tightness, shortness of breath and stridor.   Cardiovascular: Negative.  Negative for chest pain, palpitations and leg swelling.  Gastrointestinal: Negative.  Negative for abdominal pain.  Endocrine: Negative.   Genitourinary: Negative.  Negative for dysuria, urgency, frequency, hematuria, flank pain, difficulty urinating, pelvic pain and dyspareunia.  Musculoskeletal: Negative.   Skin: Negative.  Negative for rash.  Allergic/Immunologic: Negative.   Neurological: Negative.   Hematological: Negative.  Negative for adenopathy. Does not bruise/bleed easily.  Psychiatric/Behavioral: Negative.        Objective:   Physical Exam  Constitutional: She is oriented to person, place, and time. She appears well-developed and well-nourished. No distress.  HENT:  Head: Normocephalic and atraumatic.  Mouth/Throat: Oropharynx is clear and moist. No oropharyngeal exudate.  Eyes: Conjunctivae are normal. Right eye exhibits no discharge. Left eye exhibits no discharge. No scleral icterus.  Neck: Normal range of motion. Neck supple. No JVD present. No tracheal deviation present. No thyromegaly present.  Cardiovascular: Normal rate, regular rhythm, normal heart sounds and intact distal pulses.  Exam reveals no gallop and no friction rub.     No murmur heard. Pulmonary/Chest: Effort normal and breath sounds normal. No stridor. No respiratory distress. She has no wheezes. She has no rales. She exhibits no tenderness.  Abdominal: Soft. Bowel sounds are normal. She exhibits no distension and no mass. There is no tenderness. There is no rebound and no guarding.  Musculoskeletal: Normal range of motion. She exhibits no edema or tenderness.  Lymphadenopathy:    She has no cervical adenopathy.  Neurological: She is oriented to person, place, and time.  Skin: Skin is warm and dry. No rash noted. She is not diaphoretic. No erythema. No pallor.  Vitals reviewed.    Lab Results  Component Value Date   WBC 4.7 03/04/2014   HGB 15.4* 03/04/2014   HCT 46.3* 03/04/2014   PLT 162.0 03/04/2014   GLUCOSE 97 03/04/2014   CHOL 197 03/04/2014   TRIG 41.0 03/04/2014   HDL 71.10 03/04/2014   LDLDIRECT 121.5 12/11/2012   LDLCALC 118* 03/04/2014   ALT 18 03/04/2014   AST 24 03/04/2014   NA 141 03/04/2014   K 4.6 03/04/2014   CL 105 03/04/2014   CREATININE 0.7 03/04/2014   BUN 16 03/04/2014   CO2 28 03/04/2014   TSH 1.82 03/04/2014   INR 1.20 10/17/2012   HGBA1C 5.4 12/11/2012       Assessment & Plan:

## 2014-05-22 DIAGNOSIS — Z1231 Encounter for screening mammogram for malignant neoplasm of breast: Secondary | ICD-10-CM | POA: Diagnosis not present

## 2014-05-27 LAB — HM MAMMOGRAPHY: HM Mammogram: NORMAL

## 2014-05-27 NOTE — Addendum Note (Signed)
Addended by: Janith Lima on: 05/27/2014 09:45 AM   Modules accepted: Miquel Dunn

## 2014-08-08 ENCOUNTER — Ambulatory Visit (INDEPENDENT_AMBULATORY_CARE_PROVIDER_SITE_OTHER): Payer: Medicare Other | Admitting: Family Medicine

## 2014-08-08 ENCOUNTER — Encounter: Payer: Self-pay | Admitting: Family Medicine

## 2014-08-08 VITALS — BP 110/70 | Temp 98.6°F | Wt 110.0 lb

## 2014-08-08 DIAGNOSIS — M7051 Other bursitis of knee, right knee: Secondary | ICD-10-CM | POA: Diagnosis not present

## 2014-08-08 NOTE — Progress Notes (Signed)
   Subjective:    Patient ID: Alyssa Robinson, female    DOB: 1951/02/16, 64 y.o.   MRN: [id number]  HPI Here for an injury to the right knee that occurred about 10 days ago. While walking her dog up a grassy hill she slipped and fell forward, landing on both knees. Her knee felt fine for about 2 days until she did her usual exercises which include squats. Since then the anterior knee has been a little tender and a little swollen. She had done nothing for it. No locking or giving way.   Review of Systems  Constitutional: Negative.   Musculoskeletal: Positive for joint swelling and arthralgias. Negative for gait problem.       Objective:   Physical Exam  Constitutional: She appears well-developed and well-nourished.  Gets on the exam table easily   Musculoskeletal:  The right anterior knee is a little swollen around and behind the patella, and this is mildly tender. Full ROM. Negative anterior drawer and [place].           Assessment & Plan:  She has a mild patellar bursitis. This should resolve over the next week or two if she takes care of it. She can walk or play golf, but I advised her to avoid any running, squats, etc. Use ice and a Neoprene support sleeve. Recheck prn

## 2014-08-08 NOTE — Progress Notes (Signed)
Pre visit review using our clinic review tool, if applicable. No additional management support is needed unless otherwise documented below in the visit note. 

## 2014-11-04 ENCOUNTER — Encounter: Payer: Self-pay | Admitting: Internal Medicine

## 2014-11-04 ENCOUNTER — Other Ambulatory Visit (INDEPENDENT_AMBULATORY_CARE_PROVIDER_SITE_OTHER): Payer: Medicare Other

## 2014-11-04 ENCOUNTER — Ambulatory Visit (INDEPENDENT_AMBULATORY_CARE_PROVIDER_SITE_OTHER): Payer: Medicare Other | Admitting: Internal Medicine

## 2014-11-04 VITALS — BP 160/100 | HR 73 | Temp 97.9°F | Resp 16 | Ht 59.0 in | Wt 106.0 lb

## 2014-11-04 DIAGNOSIS — I10 Essential (primary) hypertension: Secondary | ICD-10-CM

## 2014-11-04 DIAGNOSIS — R42 Dizziness and giddiness: Secondary | ICD-10-CM | POA: Diagnosis not present

## 2014-11-04 DIAGNOSIS — R739 Hyperglycemia, unspecified: Secondary | ICD-10-CM

## 2014-11-04 DIAGNOSIS — R0609 Other forms of dyspnea: Secondary | ICD-10-CM | POA: Diagnosis not present

## 2014-11-04 DIAGNOSIS — R312 Other microscopic hematuria: Secondary | ICD-10-CM

## 2014-11-04 DIAGNOSIS — R3129 Other microscopic hematuria: Secondary | ICD-10-CM

## 2014-11-04 LAB — URINALYSIS, ROUTINE W REFLEX MICROSCOPIC
Bilirubin Urine: NEGATIVE
Ketones, ur: NEGATIVE
LEUKOCYTES UA: NEGATIVE
NITRITE: NEGATIVE
Total Protein, Urine: NEGATIVE
URINE GLUCOSE: NEGATIVE
Urobilinogen, UA: 0.2 (ref 0.0–1.0)
pH: 7 (ref 5.0–8.0)

## 2014-11-04 LAB — T4: T4, Total: 7 ug/dL (ref 4.5–12.0)

## 2014-11-04 LAB — BASIC METABOLIC PANEL
BUN: 18 mg/dL (ref 6–23)
CO2: 28 mEq/L (ref 19–32)
Calcium: 9.7 mg/dL (ref 8.4–10.5)
Chloride: 105 mEq/L (ref 96–112)
Creatinine, Ser: 0.82 mg/dL (ref 0.40–1.20)
GFR: 74.25 mL/min (ref >60.00)
Glucose, Bld: 99 mg/dL (ref 70–99)
Potassium: 4.1 mEq/L (ref 3.5–5.1)
Sodium: 141 mEq/L (ref 135–145)

## 2014-11-04 LAB — CARDIAC PANEL
CK MB: 2 ng/mL (ref 0.3–4.0)
Relative Index: 3.5 calc — ABNORMAL HIGH (ref 0.0–2.5)
Total CK: 57 U/L (ref 7–177)

## 2014-11-04 LAB — TSH: TSH: 2.31 u[IU]/mL (ref 0.35–4.50)

## 2014-11-04 LAB — T3, FREE: T3 FREE: 3.5 pg/mL (ref 2.3–4.2)

## 2014-11-04 LAB — TROPONIN I: TNIDX: 0 ug/l (ref 0.00–0.06)

## 2014-11-04 MED ORDER — AZILSARTAN MEDOXOMIL 80 MG PO TABS: 1.0000 | ORAL_TABLET | Freq: Every day | ORAL | Status: DC

## 2014-11-04 MED ORDER — AZILSARTAN-CHLORTHALIDONE 40-12.5 MG PO TABS: 1.0000 | ORAL_TABLET | Freq: Every day | ORAL | Status: DC

## 2014-11-04 NOTE — Patient Instructions (Signed)

## 2014-11-04 NOTE — Progress Notes (Signed)
Pre visit review using our clinic review tool, if applicable. No additional management support is needed unless otherwise documented below in the visit note. 

## 2014-11-04 NOTE — Progress Notes (Signed)
Subjective:  Patient ID: Alyssa Robinson, female    DOB: 1950-11-15  Age: 64 y.o. MRN: [id number]  CC: Hypertension   HPI AMRAN MALTER presents for a blood pressure check. For the last few weeks she has been feeling dizzy and lightheaded. She also tells me that over the last year she has developed mild dyspnea on exertion. She denies experiencing any chest pain, diaphoresis, lightheadedness or syncope.  Outpatient Prescriptions Prior to Visit  Medication Sig Dispense Refill  . Pediatric Multiple Vit-C-FA (MULTIVITAMIN ANIMAL SHAPES, WITH CA/FA,) WITH C & FA CHEW Chew 1 tablet by mouth daily.    . Probiotic Product (PROBIOTIC DAILY PO) Take by mouth.     No facility-administered medications prior to visit.    ROS Review of Systems  Constitutional: Negative.  Negative for fever, chills, diaphoresis, activity change, appetite change, fatigue and unexpected weight change.  HENT: Negative.  Negative for trouble swallowing and voice change.   Eyes: Negative.  Negative for photophobia and visual disturbance.  Respiratory: Positive for shortness of breath. Negative for cough, choking, chest tightness and stridor.   Cardiovascular: Negative.  Negative for chest pain, palpitations and leg swelling.  Gastrointestinal: Negative.  Negative for nausea, vomiting, abdominal pain, diarrhea, constipation and blood in stool.  Endocrine: Negative.   Genitourinary: Negative.  Negative for dysuria, urgency, hematuria, decreased urine volume and difficulty urinating.  Musculoskeletal: Negative.  Negative for back pain, neck pain and neck stiffness.  Skin: Negative.   Allergic/Immunologic: Negative.   Neurological: Positive for dizziness and light-headedness. Negative for facial asymmetry and headaches.  Hematological: Negative.  Negative for adenopathy. Does not bruise/bleed easily.  Psychiatric/Behavioral: Negative.     Objective:  BP 160/100 mmHg  Pulse 73  Temp(Src) 97.9 F (36.6 C) (Oral)   Resp 16  Ht 4\' 11"  (1.499 m)  Wt 106 lb (48.081 kg)  BMI 21.40 kg/m2  SpO2 99%  BP Readings from Last 3 Encounters:  11/04/14 160/100  08/08/14 110/70  05/13/14 138/88    Wt Readings from Last 3 Encounters:  11/04/14 106 lb (48.081 kg)  08/08/14 110 lb (49.896 kg)  05/13/14 111 lb (50.349 kg)    Physical Exam  Constitutional: She is oriented to person, place, and time.  Non-toxic appearance. She does not have a sickly appearance. She does not appear ill. No distress.  HENT:  Mouth/Throat: Oropharynx is clear and moist. No oropharyngeal exudate.  Eyes: Conjunctivae are normal. Right eye exhibits no discharge. Left eye exhibits no discharge. No scleral icterus.  Neck: Normal range of motion. Neck supple. No JVD present. No tracheal deviation present. No thyromegaly present.  Cardiovascular: Normal rate, regular rhythm, normal heart sounds and intact distal pulses.  Exam reveals no gallop and no friction rub.   No murmur heard. EKG: Normal sinus rhythm. No Q waves. No ST-T wave changes. No LVH. Normal EKG  Pulmonary/Chest: Effort normal and breath sounds normal. No stridor. No respiratory distress. She has no wheezes. She has no rales. She exhibits no tenderness.  Abdominal: Soft. Bowel sounds are normal. She exhibits no distension and no mass. There is no tenderness. There is no rebound and no guarding.  Musculoskeletal: Normal range of motion. She exhibits no edema or tenderness.  Lymphadenopathy:    She has no cervical adenopathy.  Neurological: She is oriented to person, place, and time.  Skin: Skin is warm and dry. No rash noted. She is not diaphoretic. No erythema. No pallor.  Vitals reviewed.   Lab Results  Component Value Date   WBC 4.7 05/13/2014   HGB 14.7 05/13/2014   HCT 42.6 05/13/2014   PLT 157.0 05/13/2014   GLUCOSE 99 11/04/2014   CHOL 197 03/04/2014   TRIG 41.0 03/04/2014   HDL 71.10 03/04/2014   LDLDIRECT 121.5 12/11/2012   LDLCALC 118* 03/04/2014   ALT  18 03/04/2014   AST 24 03/04/2014   NA 141 11/04/2014   K 4.1 11/04/2014   CL 105 11/04/2014   CREATININE 0.82 11/04/2014   BUN 18 11/04/2014   CO2 28 11/04/2014   TSH 2.31 11/04/2014   INR 1.20 10/17/2012   HGBA1C 5.4 12/11/2012    Ct Abdomen Pelvis W Wo Contrast  03/16/2014   CLINICAL DATA:  Microscopic hematuria. Colon surgery secondary to rupture. Hysterectomy. Appendectomy. Colostomy reversal. ICD10: R 31.2  EXAM: CT ABDOMEN AND PELVIS WITHOUT AND WITH CONTRAST  TECHNIQUE: Multidetector CT imaging of the abdomen and pelvis was performed following the standard protocol before and following the bolus administration of intravenous contrast.  CONTRAST:  144mL OMNIPAQUE IOHEXOL 300 MG/ML  SOLN  COMPARISON:  10/17/2012  FINDINGS: Lower chest: Clear lung bases. Marked pectus excavatum deformity. This accentuates the heart size. No pericardial or pleural effusion.  Hepatobiliary: Right hepatic lobe cyst. Normal gallbladder, without biliary ductal dilatation.  Pancreas: No ductal dilatation or acute pancreatitis. Cyst focus within the uncinate process suspected of 11 mm on image 27 of series 3.  Spleen: Normal  Adrenals/Urinary Tract: Normal adrenal glands. Punctate lower pole right renal calculus suspected. Only apparent on the sagittal and coronal reformatted images. No hydroureter or ureteric calculi. No bladder calculi. Tiny interpolar right renal lesion which is likely a cyst. No suspicious renal lesion. Moderate to good renal collecting system opacification on delayed images. Left greater than right ureteric opacification, without filling defect. No bladder filling defect or enhancing mass.  Stomach/Bowel: Normal stomach, without wall thickening. Surgical changes within the the rectosigmoid region and in the region the appendix. Colon Otherwise normal. Normal terminal ileum. Normal small bowel.  Vascular/Lymphatic: Duplicated IVC with diminutive left component. No retroperitoneal or retrocrural  adenopathy. No pelvic adenopathy.  Reproductive: Hysterectomy.  No adnexal mass.  Other: Trace fluid within the pelvis, including on image 62. Nonspecific.  Musculoskeletal: Mild osteopenia.  Lumbosacral spondylosis  IMPRESSION: 1. Punctate right renal calculus. No other explanation for hematuria. 2.  No acute process in the abdomen or pelvis. 3. Cystic lesion within the pancreatic uncinate process, likely a pseudocyst. An indolent cystic neoplasm could look similar. Per consensus criteria, followup with preferably pre and post contrast abdominal MRI/MRCP at 1 year is suggested. This recommendation follows ACR consensus guidelines: Managing Incidental Findings on Abdominal CT: White Paper of the ACR Incidental Findings Committee. [place] [phone]   Electronically Signed   By: Abigail Miyamoto M.D.   On: 03/16/2014 16:35    Assessment & Plan:   Alfie was seen today for hypertension.  Diagnoses and all orders for this visit:  Essential hypertension, benign- her blood pressure is suddenly poorly controlled. Will start an ARB for blood pressure reduction. Will check her labs to look for secondary causes and end organ damage. Orders: -     Basic metabolic panel; Future -     TSH; Future -     Urinalysis, Routine w reflex microscopic (not at [facility]); Future -     T4; Future -     T3, free; Future -     Discontinue: Azilsartan-Chlorthalidone (EDARBYCLOR) 40-12.5 MG TABS; Take  1 tablet by mouth daily. -     Azilsartan Medoxomil (EDARBI) 80 MG TABS; Take 1 tablet (80 mg total) by mouth daily.  Hyperglycemia Orders: -     Basic metabolic panel; Future  Dizziness and giddiness- her EKG and exam are normal. I suspect that this is because of the high blood pressure. Will start an ARB. Orders: -     EKG 12-Lead -     Troponin I; Future -     Cardiac panel; Future -     Exercise Tolerance Test; Future  Hematuria, microscopic Orders: -     Urinalysis, Routine w reflex microscopic (not at  [facility]); Future  DOE (dyspnea on exertion)- her EKG is normal. I have asked her to have an exercise tolerance test done to screen for coronary artery disease. Orders: -     Exercise Tolerance Test; Future   I have discontinued Ms. Horine's Azilsartan-Chlorthalidone. I am also having her start on Azilsartan Medoxomil. Additionally, I am having her maintain her multivitamin animal shapes (with Ca/FA) and Probiotic Product (PROBIOTIC DAILY PO).  Meds ordered this encounter  Medications  . DISCONTD: Azilsartan-Chlorthalidone (EDARBYCLOR) 40-12.5 MG TABS    Sig: Take 1 tablet by mouth daily.    Dispense:  35 tablet    Refill:  0  . Azilsartan Medoxomil (EDARBI) 80 MG TABS    Sig: Take 1 tablet (80 mg total) by mouth daily.    Dispense:  35 tablet    Refill:  0     Follow-up: Return in about 3 weeks (around 11/25/2014).  Scarlette Calico, MD

## 2014-11-05 ENCOUNTER — Encounter: Payer: Self-pay | Admitting: Internal Medicine

## 2014-11-09 ENCOUNTER — Telehealth: Payer: Self-pay | Admitting: Internal Medicine

## 2014-11-09 NOTE — Telephone Encounter (Signed)
Patient was in last week and was given a new medication Azilsartan Medoxomil. She started the medication on Weds. Since then she has been feeling fatigued, weak and have been having dizzy spells. Would you like to switch her medication or see her. Please advise pt. Best number is [phone]

## 2014-11-09 NOTE — Telephone Encounter (Signed)
Ask her to cut the tablet in half and take a half dose

## 2014-11-09 NOTE — Telephone Encounter (Signed)
Patient notified and advised to monitor her blood pressure this week.

## 2014-11-11 ENCOUNTER — Telehealth: Payer: Self-pay | Admitting: Internal Medicine

## 2014-11-11 NOTE — Telephone Encounter (Signed)
Is she willing to give it more time?

## 2014-11-11 NOTE — Telephone Encounter (Signed)
Patient stated that the medication Alyssa Robinson  Is still making her feel really dizzy lightheaded, please advise

## 2014-11-11 NOTE — Telephone Encounter (Signed)
She needs to be seen.

## 2014-11-11 NOTE — Telephone Encounter (Signed)
She has been having a hard time walking because of the lightheadedness and she is having heart palpitations

## 2014-11-12 ENCOUNTER — Ambulatory Visit (INDEPENDENT_AMBULATORY_CARE_PROVIDER_SITE_OTHER): Payer: Medicare Other | Admitting: Internal Medicine

## 2014-11-12 ENCOUNTER — Encounter: Payer: Self-pay | Admitting: Internal Medicine

## 2014-11-12 VITALS — BP 150/88 | HR 67 | Temp 98.0°F | Resp 14 | Wt 107.0 lb

## 2014-11-12 DIAGNOSIS — I10 Essential (primary) hypertension: Secondary | ICD-10-CM

## 2014-11-12 DIAGNOSIS — H8111 Benign paroxysmal vertigo, right ear: Secondary | ICD-10-CM | POA: Diagnosis not present

## 2014-11-12 DIAGNOSIS — H6123 Impacted cerumen, bilateral: Secondary | ICD-10-CM

## 2014-11-12 MED ORDER — AMLODIPINE BESYLATE 2.5 MG PO TABS: 2.5000 mg | ORAL_TABLET | Freq: Every day | ORAL | Status: DC

## 2014-11-12 NOTE — Patient Instructions (Addendum)
Minimal Blood Pressure Goal= AVERAGE < 140/90;  Ideal is an AVERAGE < 135/85. This AVERAGE should be calculated from @ least 5-7 BP readings taken @ different times of day on different days of week. You should not respond to isolated BP readings , but rather the AVERAGE for that week .Please bring your  blood pressure cuff to office visits to verify that it is reliable.It  can also be checked against the blood pressure device at the pharmacy. Finger or wrist cuffs are not dependable; an arm cuff is.  Go to Web M.D. for information on benign positional vertigo (BPV) . Physical therapy exercises can treat that.   Please do not use Q-tips as this simply packs the wax down against he eardrum. Should wax build up occur, please put 2-3 drops of mineral oil in the ear at night and cover the canal with a  cotton ball.In the morning fill the canal with hydrogen peroxide & leave  for 10-15 minutes.Following this shower and use the thinnest washrag available to wick out the wax.

## 2014-11-12 NOTE — Progress Notes (Signed)
   Subjective:    Patient ID: Alyssa Robinson, female    DOB: July 27, 1950, 64 y.o.   MRN: [id number]  HPI  Last week she woke up at 5 AM with diaphoresis and heart pounding. She sat up and had profound dizziness. This was also associated with nausea. There was no cardiac or neurologic prodrome prior to the event.  She was seen Dr. Jenny Reichmann that day and started on an angiotensin receptor blocker. She continued have dizziness and lethargy and has been unable to exercise. The symptoms are initiated by bed position change but also by turning her head or with body position changes such as waist flexion.  She decreased the ARB by half a dose thinking that was causing some of her symptoms; but she continued to have symptoms the day she did not take the medication.  She previously had been on amlodipine but stopped this when the blood pressure was well controlled. She also been prescribed lisinopril but did not take that medication  Her father had heart attack at age 47; he also had cardiomegaly.  Review of Systems  Denied were any change in heart rhythm or rate prior to the event. There was no associated chest pain or shortness of breath .  Also specifically denied prior to the episode were headache, limb weakness, tingling, or numbness. No seizure activity noted.     Objective:   Physical Exam Pertinent or positive findings include:  She has wax in both canals, greater on the left than the right. She has a grade 1 systolic murmur. Testing for benign positional vertigo was dramatically positive on the right and less so on the left. Cranial nerve exam was normal.  General appearance :adequately nourished; in no distress.  Eyes: No conjunctival inflammation or scleral icterus is present.EOM & FOV WNL.  Oral exam:  Lips and gums are healthy appearing.There is no oropharyngeal erythema or exudate noted. Dental hygiene is good.  Heart:  Normal rate and regular rhythm. S1 and S2 normal without gallop,  click, rub or other extra sounds    Lungs:Chest clear to auscultation; no wheezes, rhonchi,rales ,or rubs present.No increased work of breathing.   Abdomen: bowel sounds normal, soft and non-tender without masses, organomegaly or hernias noted.  No guarding or rebound.   Vascular : all pulses equal ; no bruits present.  Skin:Warm & dry.  Intact without suspicious lesions or rashes ; no tenting or jaundice   Lymphatic: No lymphadenopathy is noted about the head, neck, axilla   Neuro: Strength, tone & DTRs normal.        Assessment & Plan:  #1 benign positional vertigo  #2 hypertension  #3 wax impactions  Plan: See orders and recommendations

## 2014-11-12 NOTE — Telephone Encounter (Signed)
LMOVM for patient to call back to schedule an appointment

## 2014-11-12 NOTE — Progress Notes (Signed)
Pre visit review using our clinic review tool, if applicable. No additional management support is needed unless otherwise documented below in the visit note. 

## 2014-11-20 ENCOUNTER — Telehealth (HOSPITAL_COMMUNITY): Payer: Self-pay

## 2014-11-20 NOTE — Telephone Encounter (Signed)
Encounter complete. 

## 2014-11-24 ENCOUNTER — Telehealth (HOSPITAL_COMMUNITY): Payer: Self-pay

## 2014-11-24 NOTE — Telephone Encounter (Signed)
Encounter complete. 

## 2014-11-25 ENCOUNTER — Telehealth (HOSPITAL_COMMUNITY): Payer: Self-pay

## 2014-11-25 ENCOUNTER — Ambulatory Visit (INDEPENDENT_AMBULATORY_CARE_PROVIDER_SITE_OTHER): Payer: Medicare Other | Admitting: Internal Medicine

## 2014-11-25 ENCOUNTER — Encounter: Payer: Self-pay | Admitting: Internal Medicine

## 2014-11-25 VITALS — BP 124/82 | HR 71 | Temp 97.7°F | Resp 16 | Ht 59.0 in | Wt 107.0 lb

## 2014-11-25 DIAGNOSIS — I10 Essential (primary) hypertension: Secondary | ICD-10-CM

## 2014-11-25 NOTE — Progress Notes (Signed)
Subjective:  Patient ID: Alyssa Robinson, female    DOB: 1951/03/31  Age: 64 y.o. MRN: [id number]  CC: Hypertension   HPI Alyssa Robinson presents for follow-up on hypertension. Her blood pressure at home has been very well controlled for the past few weeks. She feels well today and offers no complaints.  Outpatient Prescriptions Prior to Visit  Medication Sig Dispense Refill  . amLODipine (NORVASC) 2.5 MG tablet Take 1 tablet (2.5 mg total) by mouth daily. 30 tablet 2  . Pediatric Multiple Vit-C-FA (MULTIVITAMIN ANIMAL SHAPES, WITH CA/FA,) WITH C & FA CHEW Chew 1 tablet by mouth daily.    . Probiotic Product (PROBIOTIC DAILY PO) Take by mouth.     No facility-administered medications prior to visit.    ROS Review of Systems  Constitutional: Negative.  Negative for fever, chills, diaphoresis, appetite change and fatigue.  HENT: Negative.   Eyes: Negative.   Respiratory: Negative.  Negative for cough, choking, chest tightness, shortness of breath and stridor.   Cardiovascular: Negative.  Negative for chest pain, palpitations and leg swelling.  Gastrointestinal: Negative.  Negative for nausea, vomiting, abdominal pain, diarrhea and constipation.  Endocrine: Negative.   Genitourinary: Negative.   Musculoskeletal: Negative.   Skin: Negative.   Allergic/Immunologic: Negative.   Neurological: Negative.  Negative for dizziness, tremors, weakness and light-headedness.  Hematological: Negative.  Negative for adenopathy.  Psychiatric/Behavioral: Negative.     Objective:  BP 124/82 mmHg  Pulse 71  Temp(Src) 97.7 F (36.5 C) (Oral)  Resp 16  Ht 4\' 11"  (1.499 m)  Wt 107 lb (48.535 kg)  BMI 21.60 kg/m2  SpO2 98%  BP Readings from Last 3 Encounters:  11/25/14 124/82  11/12/14 150/88  11/04/14 160/100    Wt Readings from Last 3 Encounters:  11/25/14 107 lb (48.535 kg)  11/12/14 107 lb (48.535 kg)  11/04/14 106 lb (48.081 kg)    Physical Exam  Constitutional: She is  oriented to person, place, and time. No distress.  HENT:  Mouth/Throat: Oropharynx is clear and moist. No oropharyngeal exudate.  Eyes: Conjunctivae are normal. Right eye exhibits no discharge. Left eye exhibits no discharge. No scleral icterus.  Neck: Normal range of motion. Neck supple. No JVD present. No tracheal deviation present. No thyromegaly present.  Cardiovascular: Normal rate, regular rhythm, normal heart sounds and intact distal pulses.  Exam reveals no gallop and no friction rub.   No murmur heard. Pulmonary/Chest: Effort normal and breath sounds normal. No stridor. No respiratory distress. She has no wheezes. She has no rales. She exhibits no tenderness.  Abdominal: Soft. Bowel sounds are normal. She exhibits no distension and no mass. There is no tenderness. There is no rebound and no guarding.  Musculoskeletal: Normal range of motion. She exhibits no edema or tenderness.  Lymphadenopathy:    She has no cervical adenopathy.  Neurological: She is oriented to person, place, and time.  Skin: Skin is warm and dry. No rash noted. She is not diaphoretic. No erythema. No pallor.    Lab Results  Component Value Date   WBC 4.7 05/13/2014   HGB 14.7 05/13/2014   HCT 42.6 05/13/2014   PLT 157.0 05/13/2014   GLUCOSE 99 11/04/2014   CHOL 197 03/04/2014   TRIG 41.0 03/04/2014   HDL 71.10 03/04/2014   LDLDIRECT 121.5 12/11/2012   LDLCALC 118* 03/04/2014   ALT 18 03/04/2014   AST 24 03/04/2014   NA 141 11/04/2014   K 4.1 11/04/2014   CL 105 11/04/2014  CREATININE 0.82 11/04/2014   BUN 18 11/04/2014   CO2 28 11/04/2014   TSH 2.31 11/04/2014   INR 1.20 10/17/2012   HGBA1C 5.4 12/11/2012    Ct Abdomen Pelvis W Wo Contrast  03/16/2014   CLINICAL DATA:  Microscopic hematuria. Colon surgery secondary to rupture. Hysterectomy. Appendectomy. Colostomy reversal. ICD10: R 31.2  EXAM: CT ABDOMEN AND PELVIS WITHOUT AND WITH CONTRAST  TECHNIQUE: Multidetector CT imaging of the abdomen  and pelvis was performed following the standard protocol before and following the bolus administration of intravenous contrast.  CONTRAST:  166mL OMNIPAQUE IOHEXOL 300 MG/ML  SOLN  COMPARISON:  10/17/2012  FINDINGS: Lower chest: Clear lung bases. Marked pectus excavatum deformity. This accentuates the heart size. No pericardial or pleural effusion.  Hepatobiliary: Right hepatic lobe cyst. Normal gallbladder, without biliary ductal dilatation.  Pancreas: No ductal dilatation or acute pancreatitis. Cyst focus within the uncinate process suspected of 11 mm on image 27 of series 3.  Spleen: Normal  Adrenals/Urinary Tract: Normal adrenal glands. Punctate lower pole right renal calculus suspected. Only apparent on the sagittal and coronal reformatted images. No hydroureter or ureteric calculi. No bladder calculi. Tiny interpolar right renal lesion which is likely a cyst. No suspicious renal lesion. Moderate to good renal collecting system opacification on delayed images. Left greater than right ureteric opacification, without filling defect. No bladder filling defect or enhancing mass.  Stomach/Bowel: Normal stomach, without wall thickening. Surgical changes within the the rectosigmoid region and in the region the appendix. Colon Otherwise normal. Normal terminal ileum. Normal small bowel.  Vascular/Lymphatic: Duplicated IVC with diminutive left component. No retroperitoneal or retrocrural adenopathy. No pelvic adenopathy.  Reproductive: Hysterectomy.  No adnexal mass.  Other: Trace fluid within the pelvis, including on image 62. Nonspecific.  Musculoskeletal: Mild osteopenia.  Lumbosacral spondylosis  IMPRESSION: 1. Punctate right renal calculus. No other explanation for hematuria. 2.  No acute process in the abdomen or pelvis. 3. Cystic lesion within the pancreatic uncinate process, likely a pseudocyst. An indolent cystic neoplasm could look similar. Per consensus criteria, followup with preferably pre and post contrast  abdominal MRI/MRCP at 1 year is suggested. This recommendation follows ACR consensus guidelines: Managing Incidental Findings on Abdominal CT: White Paper of the ACR Incidental Findings Committee. [place] [phone]   Electronically Signed   By: Abigail Miyamoto M.D.   On: 03/16/2014 16:35    Assessment & Plan:   There are no diagnoses linked to this encounter. I am having Ms. Conde maintain her multivitamin animal shapes (with Ca/FA), Probiotic Product (PROBIOTIC DAILY PO), and amLODipine.  No orders of the defined types were placed in this encounter.     Follow-up: No Follow-up on file.  Scarlette Calico, MD

## 2014-11-25 NOTE — Progress Notes (Signed)
Pre visit review using our clinic review tool, if applicable. No additional management support is needed unless otherwise documented below in the visit note. 

## 2014-11-25 NOTE — Telephone Encounter (Signed)
Encounter complete. 

## 2014-11-25 NOTE — Assessment & Plan Note (Signed)
Her blood pressure is well-controlled. We'll continue treating with the calcium channel blocker.

## 2014-11-26 ENCOUNTER — Ambulatory Visit (HOSPITAL_COMMUNITY)
Admission: RE | Admit: 2014-11-26 | Discharge: 2014-11-26 | Disposition: A | Discharge status: Home / Self Care | Payer: Medicare Other | Source: Ambulatory Visit | Attending: Internal Medicine | Admitting: Internal Medicine

## 2014-11-26 DIAGNOSIS — I493 Ventricular premature depolarization: Secondary | ICD-10-CM | POA: Diagnosis not present

## 2014-11-26 DIAGNOSIS — I491 Atrial premature depolarization: Secondary | ICD-10-CM | POA: Insufficient documentation

## 2014-11-26 DIAGNOSIS — R42 Dizziness and giddiness: Secondary | ICD-10-CM | POA: Diagnosis not present

## 2014-11-26 DIAGNOSIS — R0609 Other forms of dyspnea: Secondary | ICD-10-CM

## 2014-11-26 LAB — EXERCISE TOLERANCE TEST
CHL CUP RESTING HR STRESS: 75 {beats}/min
Estimated workload: 13.4 METS
Exercise duration (min): 11 min
MPHR: 155 {beats}/min
Peak HR: 151 {beats}/min
Percent HR: 97 %
RPE: 15

## 2014-11-27 ENCOUNTER — Encounter: Payer: Self-pay | Admitting: Internal Medicine

## 2014-12-30 ENCOUNTER — Telehealth: Payer: Self-pay | Admitting: Internal Medicine

## 2014-12-30 NOTE — Telephone Encounter (Signed)
Please advise 

## 2014-12-30 NOTE — Telephone Encounter (Signed)
Stop taking it and keep me updated on how she feels.

## 2014-12-30 NOTE — Telephone Encounter (Signed)
Pt informed will call back on Friday and Monday for an update

## 2014-12-30 NOTE — Telephone Encounter (Signed)
Pt states she is having some problems with her amLODipine (NORVASC) 2.5 MG tablet [[id number] She was taking it in the morning and within a couple hours she would start feeling really tired. Three days ago she decided to start taking in the evening around 5pm. Now she's not feeling herself and can't walk the dog as long as she could before.  Last night she stated her heart was pounding hard all night. She's wondering if she should be taking less or every other day. She is out of town till [place] Please advise patient She can be reached at [phone]

## 2015-01-01 NOTE — Telephone Encounter (Signed)
Called pt Alyssa Robinson states Alyssa Robinson is feeling much better. Alyssa Robinson is going to schedule an apt with Dr. Ronnald Ramp for a general check up to make sure everything is normal.

## 2015-01-05 ENCOUNTER — Ambulatory Visit (INDEPENDENT_AMBULATORY_CARE_PROVIDER_SITE_OTHER): Payer: Medicare Other | Admitting: Internal Medicine

## 2015-01-05 ENCOUNTER — Encounter: Payer: Self-pay | Admitting: Internal Medicine

## 2015-01-05 VITALS — BP 148/82 | HR 68 | Temp 97.8°F | Resp 16 | Ht 59.0 in | Wt 109.0 lb

## 2015-01-05 DIAGNOSIS — I10 Essential (primary) hypertension: Secondary | ICD-10-CM

## 2015-01-05 NOTE — Progress Notes (Signed)
Pre visit review using our clinic review tool, if applicable. No additional management support is needed unless otherwise documented below in the visit note. 

## 2015-01-05 NOTE — Patient Instructions (Signed)

## 2015-01-05 NOTE — Progress Notes (Signed)
Subjective:  Patient ID: Alyssa Robinson, female    DOB: 1950/07/04  Age: 64 y.o. MRN: [id number]  CC: Hypertension   HPI Alyssa Robinson presents for a blood pressure check. She was recently diagnosed with high blood pressure. She was initially treated with an ARB but developed lightheadedness and dizziness. She was subsequently placed on low dose of a calcium channel blocker and that caused her feel like her heart was pounding. She's not been on any antihypertensives for the last few weeks and she tells me that her blood pressure has been well controlled at home. Her blood pressure numbers at home are 115-125/60-75.  Outpatient Prescriptions Prior to Visit  Medication Sig Dispense Refill  . Pediatric Multiple Vit-C-FA (MULTIVITAMIN ANIMAL SHAPES, WITH CA/FA,) WITH C & FA CHEW Chew 1 tablet by mouth daily.    . Probiotic Product (PROBIOTIC DAILY PO) Take by mouth.    Marland Kitchen amLODipine (NORVASC) 2.5 MG tablet Take 1 tablet (2.5 mg total) by mouth daily. (Patient not taking: Reported on 01/05/2015) 30 tablet 2   No facility-administered medications prior to visit.    ROS Review of Systems  Constitutional: Negative.  Negative for fever, chills, diaphoresis, appetite change and fatigue.  HENT: Negative.   Eyes: Negative.   Respiratory: Negative.  Negative for cough, choking, chest tightness, shortness of breath and stridor.   Cardiovascular: Negative.  Negative for chest pain, palpitations and leg swelling.  Gastrointestinal: Negative.  Negative for abdominal pain.  Endocrine: Negative.   Genitourinary: Negative.   Musculoskeletal: Negative.   Skin: Negative.   Allergic/Immunologic: Negative.   Neurological: Negative.  Negative for dizziness, tremors, syncope, light-headedness, numbness and headaches.  Hematological: Negative.  Negative for adenopathy. Does not bruise/bleed easily.  Psychiatric/Behavioral: Negative.     Objective:  BP 148/82 mmHg  Pulse 68  Temp(Src) 97.8 F (36.6 C)  (Oral)  Resp 16  Ht 4\' 11"  (1.499 m)  Wt 109 lb (49.442 kg)  BMI 22.00 kg/m2  SpO2 98%  BP Readings from Last 3 Encounters:  01/05/15 148/82  11/25/14 124/82  11/12/14 150/88    Wt Readings from Last 3 Encounters:  01/05/15 109 lb (49.442 kg)  11/25/14 107 lb (48.535 kg)  11/12/14 107 lb (48.535 kg)    Physical Exam  Constitutional: She is oriented to person, place, and time. No distress.  HENT:  Mouth/Throat: Oropharynx is clear and moist. No oropharyngeal exudate.  Eyes: Conjunctivae are normal. Right eye exhibits no discharge. Left eye exhibits no discharge. No scleral icterus.  Neck: Normal range of motion. Neck supple. No JVD present. No tracheal deviation present. No thyromegaly present.  Cardiovascular: Normal rate, regular rhythm, normal heart sounds and intact distal pulses.  Exam reveals no gallop and no friction rub.   No murmur heard. Pulmonary/Chest: Effort normal and breath sounds normal. No stridor. No respiratory distress. She has no wheezes. She has no rales. She exhibits no tenderness.  Abdominal: Soft. Bowel sounds are normal. She exhibits no distension and no mass. There is no tenderness. There is no rebound and no guarding.  Musculoskeletal: Normal range of motion. She exhibits no edema or tenderness.  Lymphadenopathy:    She has no cervical adenopathy.  Neurological: She is oriented to person, place, and time.  Skin: Skin is warm and dry. No rash noted. She is not diaphoretic. No erythema. No pallor.  Vitals reviewed.   Lab Results  Component Value Date   WBC 4.7 05/13/2014   HGB 14.7 05/13/2014   HCT  42.6 05/13/2014   PLT 157.0 05/13/2014   GLUCOSE 99 11/04/2014   CHOL 197 03/04/2014   TRIG 41.0 03/04/2014   HDL 71.10 03/04/2014   LDLDIRECT 121.5 12/11/2012   LDLCALC 118* 03/04/2014   ALT 18 03/04/2014   AST 24 03/04/2014   NA 141 11/04/2014   K 4.1 11/04/2014   CL 105 11/04/2014   CREATININE 0.82 11/04/2014   BUN 18 11/04/2014   CO2 28  11/04/2014   TSH 2.31 11/04/2014   INR 1.20 10/17/2012   HGBA1C 5.4 12/11/2012    No results found.  Assessment & Plan:   Alyssa Robinson was seen today for hypertension.  Diagnoses and all orders for this visit:  Essential hypertension, benign- her blood pressure is adequately well controlled with no antihypertensive medications. She is doing an excellent job with her lifestyle modifications. She will monitor her blood pressure at home and she will report any elevations or symptoms to me. I've asked to return in about 3 months for recheck on her blood pressure.  I am having Alyssa Robinson maintain her multivitamin animal shapes (with Ca/FA), Probiotic Product (PROBIOTIC DAILY PO), and amLODipine.  No orders of the defined types were placed in this encounter.     Follow-up: Return in about 3 months (around 04/06/2015).  Scarlette Calico, MD

## 2015-02-16 DIAGNOSIS — D485 Neoplasm of uncertain behavior of skin: Secondary | ICD-10-CM | POA: Diagnosis not present

## 2015-02-16 DIAGNOSIS — L820 Inflamed seborrheic keratosis: Secondary | ICD-10-CM | POA: Diagnosis not present

## 2015-03-06 ENCOUNTER — Other Ambulatory Visit: Payer: Self-pay | Admitting: Internal Medicine

## 2015-03-06 DIAGNOSIS — K862 Cyst of pancreas: Secondary | ICD-10-CM

## 2015-03-10 ENCOUNTER — Ambulatory Visit (INDEPENDENT_AMBULATORY_CARE_PROVIDER_SITE_OTHER): Payer: Medicare Other | Admitting: Internal Medicine

## 2015-03-10 ENCOUNTER — Encounter: Payer: Self-pay | Admitting: Internal Medicine

## 2015-03-10 VITALS — BP 138/84 | HR 72 | Temp 98.2°F | Resp 18 | Wt 110.0 lb

## 2015-03-10 DIAGNOSIS — J069 Acute upper respiratory infection, unspecified: Secondary | ICD-10-CM | POA: Diagnosis not present

## 2015-03-10 DIAGNOSIS — J309 Allergic rhinitis, unspecified: Secondary | ICD-10-CM | POA: Diagnosis not present

## 2015-03-10 NOTE — Patient Instructions (Signed)
Upper Respiratory Infection, Adult Most upper respiratory infections (URIs) are a viral infection of the air passages leading to the lungs. A URI affects the nose, throat, and upper air passages. The most common type of URI is nasopharyngitis and is typically referred to as "the common cold." URIs run their course and usually go away on their own. Most of the time, a URI does not require medical attention, but sometimes a bacterial infection in the upper airways can follow a viral infection. This is called a secondary infection. Sinus and middle ear infections are common types of secondary upper respiratory infections. Bacterial pneumonia can also complicate a URI. A URI can worsen asthma and chronic obstructive pulmonary disease (COPD). Sometimes, these complications can require emergency medical care and may be life threatening.  CAUSES Almost all URIs are caused by viruses. A virus is a type of germ and can spread from one person to another.  RISKS FACTORS You may be at risk for a URI if:   You smoke.   You have chronic heart or lung disease.  You have a weakened defense (immune) system.   You are very young or very old.   You have nasal allergies or asthma.  You work in crowded or poorly ventilated areas.  You work in health care facilities or schools. SIGNS AND SYMPTOMS  Symptoms typically develop 2-3 days after you come in contact with a cold virus. Most viral URIs last 7-10 days. However, viral URIs from the influenza virus (flu virus) can last 14-18 days and are typically more severe. Symptoms may include:   Runny or stuffy (congested) nose.   Sneezing.   Cough.   Sore throat.   Headache.   Fatigue.   Fever.   Loss of appetite.   Pain in your forehead, behind your eyes, and over your cheekbones (sinus pain).  Muscle aches.  DIAGNOSIS  Your health care provider may diagnose a URI by:  Physical exam.  Tests to check that your symptoms are not due to  another condition such as:  Strep throat.  Sinusitis.  Pneumonia.  Asthma. TREATMENT  A URI goes away on its own with time. It cannot be cured with medicines, but medicines may be prescribed or recommended to relieve symptoms. Medicines may help:  Reduce your fever.  Reduce your cough.  Relieve nasal congestion. HOME CARE INSTRUCTIONS   Take medicines only as directed by your health care provider.   Gargle warm saltwater or take cough drops to comfort your throat as directed by your health care provider.  Use a warm mist humidifier or inhale steam from a shower to increase air moisture. This may make it easier to breathe.  Drink enough fluid to keep your urine clear or pale yellow.   Eat soups and other clear broths and maintain good nutrition.   Rest as needed.   Return to work when your temperature has returned to normal or as your health care provider advises. You may need to stay home longer to avoid infecting others. You can also use a face mask and careful hand washing to prevent spread of the virus.  Increase the usage of your inhaler if you have asthma.   Do not use any tobacco products, including cigarettes, chewing tobacco, or electronic cigarettes. If you need help quitting, ask your health care provider. PREVENTION  The best way to protect yourself from getting a cold is to practice good hygiene.   Avoid oral or hand contact with people with cold   symptoms.   Wash your hands often if contact occurs.  There is no clear evidence that vitamin C, vitamin E, echinacea, or exercise reduces the chance of developing a cold. However, it is always recommended to get plenty of rest, exercise, and practice good nutrition.  SEEK MEDICAL CARE IF:   You are getting worse rather than better.   Your symptoms are not controlled by medicine.   You have chills.  You have worsening shortness of breath.  You have brown or red mucus.  You have yellow or brown nasal  discharge.  You have pain in your face, especially when you bend forward.  You have a fever.  You have swollen neck glands.  You have pain while swallowing.  You have white areas in the back of your throat. SEEK IMMEDIATE MEDICAL CARE IF:   You have severe or persistent:  Headache.  Ear pain.  Sinus pain.  Chest pain.  You have chronic lung disease and any of the following:  Wheezing.  Prolonged cough.  Coughing up blood.  A change in your usual mucus.  You have a stiff neck.  You have changes in your:  Vision.  Hearing.  Thinking.  Mood. MAKE SURE YOU:   Understand these instructions.  Will watch your condition.  Will get help right away if you are not doing well or get worse.   This information is not intended to replace advice given to you by your health care provider. Make sure you discuss any questions you have with your health care provider.   Document Released: 10/04/2000 Document Revised: 08/25/2014 Document Reviewed: 07/16/2013 Elsevier Interactive Patient Education 2016 [employer] Inc[employer]  

## 2015-03-10 NOTE — Progress Notes (Signed)
Subjective:    Patient ID: Alyssa Robinson, female    DOB: December 04, 1950, 64 y.o.   MRN: [id number]  HPI She is here for an acute visit for cold symptoms.   She was typically getting colds 1-2 times a year and in the past they have turned into bronchitis.  She has not had a bad cold or bronchitis in four years and she thinks that is because she retired.   Last Friday she was working on her yard and was breathing in all the dust/leaves.  Sunday and Monday she developeda sore throat, she had some clear nasal congestion with occaisional blood in the mucus.  She is starting to get a dry cough and has had a little sob with exertion.  She is concerned about developing bronchitis.  She is taking mucinex and took robitussin dm last night.   Medications and allergies reviewed with patient and updated if appropriate.  Patient Active Problem List   Diagnosis Date Noted  . 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 Hematuria, microscopic 03/04/2014  . Routine general medical examination at a health care facility 12/11/2012  . Essential hypertension, benign 12/11/2012  . Constipation 10/17/2012    Current Outpatient Prescriptions on File Prior to Visit  Medication Sig Dispense Refill  . Pediatric Multiple Vit-C-FA (MULTIVITAMIN ANIMAL SHAPES, WITH CA/FA,) WITH C & FA CHEW Chew 1 tablet by mouth daily.    . Probiotic Product (PROBIOTIC DAILY PO) Take by mouth.     No current facility-administered medications on file prior to visit.    Past Medical History  Diagnosis Date  . Diverticulosis   . Seasonal allergies   . Bronchitis   . Hypertension     no rx for 2 yrs  . Bronchitis     hx  . GERD (gastroesophageal reflux disease)     occ  . Headache(784.0)     migraines occ  . Anemia     hx    Past Surgical History  Procedure Laterality Date  . Upper gastrointestinal endoscopy      2013 -  . Colonoscopy      in 2012 -   . Hernia repair      X2 2-4 y/o inguinal  . Abdominal hysterectomy     64  y/o  . Capsule endoscopy    . Laparotomy N/A 10/17/2012    Procedure: EXPLORATORY LAPAROTOMY,  COLON RESECTION AND COLOSTOMY;  Surgeon: Gwenyth Ober, MD;  Location: [facility] [place];  Service: General;  Laterality: N/A;  . Colostomy closure  03/17/2013  . Lysis of adhesion  03/17/2013    Procedure: LYSIS OF ADHESION;  Surgeon: Gwenyth Ober, MD;  Location: [place];  Service: General;;  . Appendectomy  03/17/2013    Procedure: INCIDENTAL APPENDECTOMY;  Surgeon: Gwenyth Ober, MD;  Location: [place];  Service: General;;  . Colostomy reversal  03/17/2013    Procedure: COLOSTOMY REVERSAL;  Surgeon: Gwenyth Ober, MD;  Location: [place];  Service: General;;    Social History   Social History  . Marital Status: Married    Spouse Name: N/A  . Number of Children: N/A  . Years of Education: N/A   Social History Main Topics  . Smoking status: Never Smoker   . Smokeless tobacco: Never Used     Comment: EXPOSED TO SECOND HAND SMOKE   . Alcohol Use: 1.2 oz/week    2 Glasses of wine per week     Comment: occasional  . Drug Use: No  .  Sexual Activity: Not Currently   Other Topics Concern  . None   Social History Narrative    Review of Systems  Constitutional: Negative for fever, chills, appetite change and fatigue.  HENT: Positive for congestion, ear pain (occasional left ear pain) and postnasal drip. Negative for sinus pressure and sore throat.   Respiratory: Positive for cough (dry hacking cough) and shortness of breath (mild sob with exertion). Negative for wheezing.   Gastrointestinal: Negative for nausea, abdominal pain and diarrhea.  Musculoskeletal: Negative for myalgias.  Neurological: Negative for dizziness, light-headedness and headaches.       Objective:   Filed Vitals:   03/10/15 0933  BP: 138/84  Pulse: 72  Temp: 98.2 F (36.8 C)  Resp: 18   Filed Weights   03/10/15 0933  Weight: 110 lb (49.896 kg)   Body mass index is 22.21 kg/(m^2).   Physical Exam GENERAL  APPEARANCE: Appears stated age, well appearing, NAD EYES: conjunctiva clear, no icterus HEENT: bilateral tympanic membranes and ear canals normal, oropharynx with mild erythema, no thyromegaly, trachea midline, no cervical or supraclavicular lymphadenopathy LUNGS: Clear to auscultation without wheeze or crackles, unlabored breathing, good air entry bilaterally HEART: Normal S1,S2 without murmurs EXTREMITIES: Without clubbing, cyanosis, or edema      Assessment & Plan:   URI, allergic rhinitis: Likely a combination of viral infection and allergies No need for an antibiotic Continue Mucinex and Robitussin-DM at night Start an antihistamine, Zyrtec and Claritin or Allegra daily Start saline nasal spray Consider using Flonase as well Continue increased rest and fluids  Call if symptoms worsen or do not improve

## 2015-03-10 NOTE — Progress Notes (Signed)
Pre visit review using our clinic review tool, if applicable. No additional management support is needed unless otherwise documented below in the visit note. 

## 2015-03-24 ENCOUNTER — Telehealth: Payer: Self-pay | Admitting: Internal Medicine

## 2015-03-24 DIAGNOSIS — H5203 Hypermetropia, bilateral: Secondary | ICD-10-CM | POA: Diagnosis not present

## 2015-03-24 DIAGNOSIS — H2513 Age-related nuclear cataract, bilateral: Secondary | ICD-10-CM | POA: Diagnosis not present

## 2015-03-24 DIAGNOSIS — H40003 Preglaucoma, unspecified, bilateral: Secondary | ICD-10-CM | POA: Diagnosis not present

## 2015-03-24 DIAGNOSIS — H52203 Unspecified astigmatism, bilateral: Secondary | ICD-10-CM | POA: Diagnosis not present

## 2015-03-24 NOTE — Telephone Encounter (Signed)
Is this a routine MRI for pt she has an apt in Feb.

## 2015-03-24 NOTE — Telephone Encounter (Signed)
Pt states she received an email states she has a cysy on her kidney which looking back in her mychart message  In 2015 that is true however when looking in imaging tab on prior scan the report states cyst is on pancreas. Pt states she will keep the apt

## 2015-03-24 NOTE — Telephone Encounter (Signed)
Pt received a call from [facility] regarding a referral for an MRI for a cyst on pancreas. She doesn't know anything about this. She said there was something about a year ago and Dr. Ronnald Ramp wanted her to check back on this a year later, but she thought she would talk with him again about this before.  Please advise

## 2015-03-24 NOTE — Telephone Encounter (Signed)
This is her 1 year f/up on the cyst in her pancreas

## 2015-04-05 ENCOUNTER — Telehealth: Payer: Self-pay | Admitting: Internal Medicine

## 2015-04-05 ENCOUNTER — Other Ambulatory Visit: Payer: Self-pay | Admitting: Internal Medicine

## 2015-04-05 DIAGNOSIS — K862 Cyst of pancreas: Secondary | ICD-10-CM

## 2015-04-05 NOTE — Telephone Encounter (Signed)
Needs epic order to be changed to abdomen with and without contrast unless there is a reason why patient can not have contacts.  Refer back to previous PT report.

## 2015-04-05 NOTE — Telephone Encounter (Signed)
None listed in alleriges. Done

## 2015-04-06 ENCOUNTER — Other Ambulatory Visit: Payer: Medicare Other

## 2015-04-07 ENCOUNTER — Inpatient Hospital Stay
Admission: RE | Admit: 2015-04-07 | Discharge: 2015-04-07 | Disposition: A | Discharge status: Home / Self Care | Payer: Medicare Other | Source: Ambulatory Visit | Attending: Internal Medicine | Admitting: Internal Medicine

## 2015-04-07 ENCOUNTER — Ambulatory Visit
Admission: RE | Admit: 2015-04-07 | Discharge: 2015-04-07 | Disposition: A | Discharge status: Home / Self Care | Payer: Medicare Other | Source: Ambulatory Visit | Attending: Internal Medicine | Admitting: Internal Medicine

## 2015-04-07 ENCOUNTER — Encounter: Payer: Self-pay | Admitting: Internal Medicine

## 2015-04-07 ENCOUNTER — Other Ambulatory Visit: Payer: Medicare Other

## 2015-04-07 DIAGNOSIS — K862 Cyst of pancreas: Secondary | ICD-10-CM

## 2015-04-07 MED ORDER — GADOBENATE DIMEGLUMINE 529 MG/ML IV SOLN
9.0000 mL | Freq: Once | INTRAVENOUS | Status: AC | PRN
  Administered 2015-04-07: 9 mL via INTRAVENOUS

## 2015-04-09 ENCOUNTER — Other Ambulatory Visit: Payer: Self-pay | Admitting: Internal Medicine

## 2015-04-09 DIAGNOSIS — K862 Cyst of pancreas: Secondary | ICD-10-CM

## 2015-04-22 ENCOUNTER — Encounter (HOSPITAL_COMMUNITY): Payer: Self-pay | Admitting: *Deleted

## 2015-04-22 ENCOUNTER — Other Ambulatory Visit: Payer: Self-pay | Admitting: Gastroenterology

## 2015-04-22 DIAGNOSIS — R933 Abnormal findings on diagnostic imaging of other parts of digestive tract: Secondary | ICD-10-CM | POA: Diagnosis not present

## 2015-04-22 DIAGNOSIS — Z800 Family history of malignant neoplasm of digestive organs: Secondary | ICD-10-CM | POA: Diagnosis not present

## 2015-04-30 ENCOUNTER — Ambulatory Visit (HOSPITAL_COMMUNITY): Payer: Medicare Other | Admitting: Certified Registered"

## 2015-04-30 ENCOUNTER — Encounter (HOSPITAL_COMMUNITY): Payer: Self-pay

## 2015-04-30 ENCOUNTER — Ambulatory Visit (HOSPITAL_COMMUNITY)
Admission: RE | Admit: 2015-04-30 | Discharge: 2015-04-30 | Disposition: A | Discharge status: Home / Self Care | Payer: Medicare Other | Source: Ambulatory Visit | Attending: Gastroenterology | Admitting: Gastroenterology

## 2015-04-30 ENCOUNTER — Encounter (HOSPITAL_COMMUNITY)
Admission: RE | Disposition: A | Discharge status: Home / Self Care | Payer: Self-pay | Source: Ambulatory Visit | Attending: Gastroenterology

## 2015-04-30 DIAGNOSIS — I10 Essential (primary) hypertension: Secondary | ICD-10-CM | POA: Insufficient documentation

## 2015-04-30 DIAGNOSIS — K862 Cyst of pancreas: Secondary | ICD-10-CM | POA: Diagnosis not present

## 2015-04-30 DIAGNOSIS — K219 Gastro-esophageal reflux disease without esophagitis: Secondary | ICD-10-CM | POA: Insufficient documentation

## 2015-04-30 DIAGNOSIS — R933 Abnormal findings on diagnostic imaging of other parts of digestive tract: Secondary | ICD-10-CM | POA: Diagnosis not present

## 2015-04-30 HISTORY — PX: EUS: SHX5427

## 2015-04-30 SURGERY — UPPER ENDOSCOPIC ULTRASOUND (EUS) LINEAR
Anesthesia: Monitor Anesthesia Care

## 2015-04-30 MED ORDER — SODIUM CHLORIDE 0.9 % IV SOLN: INTRAVENOUS | Status: DC

## 2015-04-30 MED ORDER — PROPOFOL 10 MG/ML IV BOLUS
INTRAVENOUS | Status: DC | PRN
Start: 2015-04-30 — End: 2015-04-30
  Administered 2015-04-30: 50 mg via INTRAVENOUS
  Administered 2015-04-30: 30 mg via INTRAVENOUS

## 2015-04-30 MED ORDER — PROPOFOL 10 MG/ML IV BOLUS
INTRAVENOUS | Status: AC
  Filled 2015-04-30: qty 40

## 2015-04-30 MED ORDER — LIDOCAINE HCL (CARDIAC) 20 MG/ML IV SOLN
INTRAVENOUS | Status: AC
  Filled 2015-04-30: qty 5

## 2015-04-30 MED ORDER — BUTAMBEN-TETRACAINE-BENZOCAINE 2-2-14 % EX AERO
INHALATION_SPRAY | CUTANEOUS | Status: DC | PRN
  Administered 2015-04-30: 2 via TOPICAL

## 2015-04-30 MED ORDER — ONDANSETRON HCL 4 MG/2ML IJ SOLN
INTRAMUSCULAR | Status: AC
  Filled 2015-04-30: qty 2

## 2015-04-30 MED ORDER — PROPOFOL 500 MG/50ML IV EMUL
INTRAVENOUS | Status: DC | PRN
  Administered 2015-04-30: 150 ug/kg/min via INTRAVENOUS

## 2015-04-30 MED ORDER — LACTATED RINGERS IV SOLN
INTRAVENOUS | Status: DC
  Administered 2015-04-30: 1000 mL via INTRAVENOUS

## 2015-04-30 MED ORDER — PROPOFOL 10 MG/ML IV BOLUS
INTRAVENOUS | Status: AC
Start: 2015-04-30 — End: 2015-04-30
  Filled 2015-04-30: qty 20

## 2015-04-30 MED ORDER — LIDOCAINE HCL (CARDIAC) 20 MG/ML IV SOLN
INTRAVENOUS | Status: DC | PRN
  Administered 2015-04-30: 40 mg via INTRAVENOUS

## 2015-04-30 MED ORDER — ONDANSETRON HCL 4 MG/2ML IJ SOLN
INTRAMUSCULAR | Status: DC | PRN
  Administered 2015-04-30: 4 mg via INTRAVENOUS

## 2015-04-30 NOTE — H&P (Signed)
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  This was noted with sequential imaging on the MRI and CT scan.  Past Medical History  Diagnosis Date  . Diverticulosis   . Seasonal allergies   . Bronchitis   . Hypertension     no rx for 2 yrs  . Bronchitis     hx  . GERD (gastroesophageal reflux disease)     occ  . Headache(784.0)     migraines occ  . Anemia     hx    Past Surgical History  Procedure Laterality Date  . Upper gastrointestinal endoscopy      2013 -  . Colonoscopy      in 2012 -   . Hernia repair      X2 2-4 y/o inguinal  . Abdominal hysterectomy      65 y/o  . Capsule endoscopy    . Laparotomy N/A 10/17/2012    Procedure: EXPLORATORY LAPAROTOMY,  COLON RESECTION AND COLOSTOMY;  Surgeon: Gwenyth Ober, MD;  Location: [place];  Service: General;  Laterality: N/A;  . Colostomy closure  03/17/2013  . Lysis of adhesion  03/17/2013    Procedure: LYSIS OF ADHESION;  Surgeon: Gwenyth Ober, MD;  Location: [place];  Service: General;;  . Appendectomy  03/17/2013    Procedure: INCIDENTAL APPENDECTOMY;  Surgeon: Gwenyth Ober, MD;  Location: [place];  Service: General;;  . Colostomy reversal  03/17/2013    Procedure: COLOSTOMY REVERSAL;  Surgeon: Gwenyth Ober, MD;  Location: [place];  Service: General;;    Family History  Problem Relation Age of Onset  . Esophageal cancer Mother     and ovarian cancer  . Alcohol abuse Mother   . Arthritis Mother   . Colon cancer Other   . Pancreatic cancer Other   . Heart disease Father   . Alcohol abuse Father   . Hypertension Father   . Diabetes Father   . Early death Father   . Stroke Neg Hx   . Hyperlipidemia Neg Hx   . Kidney disease Neg Hx   . COPD Neg Hx     Social History:  reports that she has never smoked. She has never used smokeless tobacco. She reports that she drinks about 1.2 oz of alcohol per week. She reports that she does not use illicit drugs.  Allergies:   Allergies  Allergen Reactions  . Caffeine Palpitations    Heart racing  . Influenza Vaccines Nausea And Vomiting    Dizziness    Medications:  Scheduled:  Continuous: . sodium chloride    . lactated ringers 1,000 mL (04/30/15 1001)    No results found for this or any previous visit (from the past 24 hour(s)).   No results found.  ROS:  As stated above in the HPI otherwise negative.  Blood pressure 162/69, pulse 75, temperature 97.5 F (36.4 C), temperature source Oral, resp. rate 18, height 4\' 11"  (1.499 m), weight 49.442 kg (109 lb), SpO2 100 %.    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  Delanda Bulluck D 04/30/2015, 10:37 AM

## 2015-04-30 NOTE — Op Note (Signed)
[facility], [id number]   UPPER ENDOSCOPIC ULTRASOUND PROCEDURE REPORT     EXAM DATE: 04/30/2015  PATIENT NAME:          Alyssa Robinson, Alyssa Robinson          MR#: [id number] BIRTHDATE:       June 01, 1950     VISIT #:     [id number] ATTENDING:     Carol Ada, MD     STATUS:     outpatient ASSISTANT:      Wilkie Aye  REFERRING MD:  Juanita Craver, M.D. ASA CLASS:        Class III  INDICATIONS:  The patient is a 65 yr old female here for a lower endoscopic ultrasound due to pancreatic cyst. PROCEDURE PERFORMED:     Upper EUS  MEDICATIONS:     Monitored anesthesia care  CONSENT: The patient understands the risks and benefits of the procedure and understands that these risks include, but are not limited to: sedation, allergic reaction, infection, perforation and/or bleeding. Alternative means of evaluation and treatment include, among others: physical exam, x-rays, and/or surgical intervention. The patient elects to proceed with this endoscopic procedure.  DESCRIPTION OF PROCEDURE: During intra-op preparation period all mechanical & medical equipment was checked for proper function. Hand hygiene and appropriate measures for infection prevention was taken. After the risks, benefits and alternatives of the procedure were thoroughly explained, Informed consent was verified, confirmed and timeout was successfully executed by the treatment team. The patient was then placed in the left, lateral, decubitus position and IV sedation was administered. Throughout the procedure, the patients blood pressure, pulse and oxygen saturations were monitored continuously. Under direct visualization, the EUS scope was introduced through the mouth and advanced to the second portion of the duodenum.  Water was used as necessary to provide an acoustic interface. The pulse, BP, and O2 saturation were monitored and documented by the physician and  nursing staff throughout the procedure. Upon completion of the imaging, water was removed and the patient was then discharged to recovery in stable condition with the appropriate post procedure care. Estimated blood loss is zero unless otherwise noted in this procedure report.   FINDINGS: In the uncinate process an 8 mm x 6 mm irregularly bordered cyst was identified.  The wall was thin and there were no internal septations or mural nodules.  A clear communication with a pancreatic duct was not clearly identified.  There was no evidence of any pancreatic ductal dilations in the main PD or side brances. The main PD measured 1.7 mm in the body of the pancreas.  The pancreas was also homogenous and there was no evidence of any abnormalities.    ADVERSE EVENTS:     There were no immediate complications. IMPRESSIONS:     Small pancreatic cyst in the uncinate process.   RECOMMENDATIONS:     1) MRI in one year as the CT scan and recent MRI demonstrated an increase in size.  ___________________________________ Carol Ada, MD eSigned:  Carol Ada, MD 04/30/2015 11:18 AM   cc:

## 2015-04-30 NOTE — Anesthesia Postprocedure Evaluation (Signed)
Anesthesia Post Note  Patient: Danylle Spiller Anger  Procedure(s) Performed: Procedure(s) (LRB): UPPER ENDOSCOPIC ULTRASOUND (EUS) LINEAR (N/A)  Patient location during evaluation: Endoscopy Anesthesia Type: General Level of consciousness: awake and alert Pain management: pain level controlled Vital Signs Assessment: post-procedure vital signs reviewed and stable Respiratory status: spontaneous breathing, nonlabored ventilation, respiratory function stable and patient connected to nasal cannula oxygen Cardiovascular status: blood pressure returned to baseline and stable Postop Assessment: no signs of nausea or vomiting Anesthetic complications: no    Last Vitals:  Filed Vitals:   04/30/15 1130 04/30/15 1140  BP: 126/63 115/63  Pulse: 70 60  Temp:    Resp: 12 18    Last Pain: There were no vitals filed for this visit.               Henson Fraticelli,JAMES TERRILL

## 2015-04-30 NOTE — Transfer of Care (Signed)
Immediate Anesthesia Transfer of Care Note  Patient: Alyssa Robinson  Procedure(s) Performed: Procedure(s): UPPER ENDOSCOPIC ULTRASOUND (EUS) LINEAR (N/A)  Patient Location: PACU  Anesthesia Type:MAC  Level of Consciousness: alert , patient cooperative and responds to stimulation  Airway & Oxygen Therapy: Patient Spontanous Breathing and Patient connected to nasal cannula oxygen  Post-op Assessment: Report given to RN and Post -op Vital signs reviewed and stable  Post vital signs: stable  Last Vitals:  Filed Vitals:   04/30/15 0947 04/30/15 1117  BP: 162/69 108/55  Pulse: 75 68  Temp: 36.4 C   Resp: 18 12    Complications: No apparent anesthesia complications

## 2015-04-30 NOTE — Discharge Instructions (Signed)

## 2015-04-30 NOTE — Anesthesia Preprocedure Evaluation (Signed)
Anesthesia Evaluation  Patient identified by MRN, date of birth, ID band Patient awake    Reviewed: Allergy & Precautions, NPO status , Patient's Chart, lab work & pertinent test results  History of Anesthesia Complications Negative for: history of anesthetic complications  Airway Mallampati: I  TM Distance: >3 FB Neck ROM: Full    Dental   Pulmonary neg pulmonary ROS,    breath sounds clear to auscultation       Cardiovascular hypertension,  Rhythm:Regular Rate:Normal     Neuro/Psych    GI/Hepatic Neg liver ROS, GERD  ,  Endo/Other  negative endocrine ROS  Renal/GU negative Renal ROS     Musculoskeletal negative musculoskeletal ROS (+)   Abdominal   Peds  Hematology negative hematology ROS (+)   Anesthesia Other Findings   Reproductive/Obstetrics                             Anesthesia Physical Anesthesia Plan  ASA: II  Anesthesia Plan: MAC   Post-op Pain Management:    Induction: Intravenous  Airway Management Planned: Natural Airway and Nasal Cannula  Additional Equipment:   Intra-op Plan:   Post-operative Plan:   Informed Consent: I have reviewed the patients History and Physical, chart, labs and discussed the procedure including the risks, benefits and alternatives for the proposed anesthesia with the patient or authorized representative who has indicated his/her understanding and acceptance.     Plan Discussed with: CRNA and Surgeon  Anesthesia Plan Comments:         Anesthesia Quick Evaluation

## 2015-05-03 ENCOUNTER — Encounter (HOSPITAL_COMMUNITY): Payer: Self-pay | Admitting: Gastroenterology

## 2015-05-26 DIAGNOSIS — Z1231 Encounter for screening mammogram for malignant neoplasm of breast: Secondary | ICD-10-CM | POA: Diagnosis not present

## 2015-05-26 LAB — HM MAMMOGRAPHY

## 2015-05-31 ENCOUNTER — Emergency Department (INDEPENDENT_AMBULATORY_CARE_PROVIDER_SITE_OTHER)
Admission: EM | Admit: 2015-05-31 | Discharge: 2015-05-31 | Disposition: A | Discharge status: Home / Self Care | Payer: Medicare Other | Source: Home / Self Care | Attending: Family Medicine | Admitting: Family Medicine

## 2015-05-31 ENCOUNTER — Encounter (HOSPITAL_COMMUNITY): Payer: Self-pay | Admitting: Emergency Medicine

## 2015-05-31 DIAGNOSIS — B029 Zoster without complications: Secondary | ICD-10-CM | POA: Diagnosis not present

## 2015-05-31 MED ORDER — VALACYCLOVIR HCL 1 G PO TABS: 1000.0000 mg | ORAL_TABLET | Freq: Three times a day (TID) | ORAL | Status: AC

## 2015-05-31 NOTE — Discharge Instructions (Signed)
Shingles Shingles is an infection that causes a painful skin rash and fluid-filled blisters. Shingles is caused by the same virus that causes chickenpox. Shingles only develops in people who:  Have had chickenpox.  Have gotten the chickenpox vaccine. (This is rare.) The first symptoms of shingles may be itching, tingling, or pain in an area on your skin. A rash will follow in a few days or weeks. The rash is usually on one side of the body in a bandlike or beltlike pattern. Over time, the rash turns into fluid-filled blisters that break open, scab over, and dry up. Medicines may:  Help you manage pain.  Help you recover more quickly.  Help to prevent long-term problems. HOME CARE Medicines  Take medicines only as told by your doctor.  Apply an anti-itch or numbing cream to the affected area as told by your doctor. Blister and Rash Care  Take a cool bath or put cool compresses on the area of the rash or blisters as told by your doctor. This may help with pain and itching.  Keep your rash covered with a loose bandage (dressing). Wear loose-fitting clothing.  Keep your rash and blisters clean with mild soap and cool water or as told by your doctor.  Check your rash every day for signs of infection. These include redness, swelling, and pain that lasts or gets worse.  Do not pick your blisters.  Do not scratch your rash. General Instructions  Rest as told by your doctor.  Keep all follow-up visits as told by your doctor. This is important.  Until your blisters scab over, your infection can cause chickenpox in people who have never had it or been vaccinated against it. To prevent this from happening, avoid touching other people or being around other people, especially:  Babies.  Pregnant women.  Children who have eczema.  Elderly people who have transplants.  People who have chronic illnesses, such as leukemia or AIDS. GET HELP IF:  Your pain does not get better with  medicine.  Your pain does not get better after the rash heals.  Your rash looks infected. Signs of infection include:  Redness.  Swelling.  Pain that lasts or gets worse. GET HELP RIGHT AWAY IF:  The rash is on your face or nose.  You have pain in your face, pain around your eye area, or loss of feeling on one side of your face.  You have ear pain or you have ringing in your ear.  You have loss of taste.  Your condition gets worse.   This information is not intended to replace advice given to you by your health care provider. Make sure you discuss any questions you have with your health care provider.   Document Released: 09/27/2007 Document Revised: 05/01/2014 Document Reviewed: 01/20/2014 Elsevier Interactive Patient Education 2016 [employer]  

## 2015-05-31 NOTE — ED Notes (Signed)
The patient presented to the [facility] with a complaint of a rash on her stomach and back x 3 days.

## 2015-05-31 NOTE — ED Provider Notes (Signed)
CSN: [id number]     Arrival date & time 05/31/15  1521 History   First MD Initiated Contact with Patient 05/31/15 1713     Chief Complaint  Patient presents with  . Rash   (Consider location/radiation/quality/duration/timing/severity/associated sxs/prior Treatment) HPI History obtained from patient:   LOCATION: Abdomen and back SEVERITY: 4 DURATION: 2 days CONTEXT: Sudden onset itching pain and burning and then rash. QUALITY: MODIFYING FACTORS: No treatment ASSOCIATED SYMPTOMS: None TIMING: Constant OCCUPATION:  Past Medical History  Diagnosis Date  . Diverticulosis   . Seasonal allergies   . Bronchitis   . Hypertension     no rx for 2 yrs  . Bronchitis     hx  . GERD (gastroesophageal reflux disease)     occ  . Headache(784.0)     migraines occ  . Anemia     hx   Past Surgical History  Procedure Laterality Date  . Upper gastrointestinal endoscopy      2013 -  . Colonoscopy      in 2012 -   . Hernia repair      X2 2-4 y/o inguinal  . Abdominal hysterectomy      65 y/o  . Capsule endoscopy    . Laparotomy N/A 10/17/2012    Procedure: EXPLORATORY LAPAROTOMY,  COLON RESECTION AND COLOSTOMY;  Surgeon: Gwenyth Ober, MD;  Location: [place];  Service: General;  Laterality: N/A;  . Colostomy closure  03/17/2013  . Lysis of adhesion  03/17/2013    Procedure: LYSIS OF ADHESION;  Surgeon: Gwenyth Ober, MD;  Location: [place];  Service: General;;  . Appendectomy  03/17/2013    Procedure: INCIDENTAL APPENDECTOMY;  Surgeon: Gwenyth Ober, MD;  Location: [place];  Service: General;;  . Colostomy reversal  03/17/2013    Procedure: COLOSTOMY REVERSAL;  Surgeon: Gwenyth Ober, MD;  Location: [place];  Service: General;;  . Eus N/A 04/30/2015    Procedure: UPPER ENDOSCOPIC ULTRASOUND (EUS) LINEAR;  Surgeon: Carol Ada, MD;  Location: [facility];  Service: Endoscopy;  Laterality: N/A;   Family History  Problem Relation Age of Onset  . Esophageal cancer Mother     and ovarian  cancer  . Alcohol abuse Mother   . Arthritis Mother   . Colon cancer Other   . Pancreatic cancer Other   . Heart disease Father   . Alcohol abuse Father   . Hypertension Father   . Diabetes Father   . Early death Father   . Stroke Neg Hx   . Hyperlipidemia Neg Hx   . Kidney disease Neg Hx   . COPD Neg Hx    Social History  Substance Use Topics  . Smoking status: Never Smoker   . Smokeless tobacco: Never Used     Comment: EXPOSED TO SECOND HAND SMOKE   . Alcohol Use: 1.2 oz/week    2 Glasses of wine per week     Comment: occasional   OB History    No data available     Review of Systems ROS +'ve abdomen and back rash  Denies: HEADACHE, NAUSEA, ABDOMINAL PAIN, CHEST PAIN, CONGESTION, DYSURIA, SHORTNESS OF BREATH  Allergies  Caffeine and Influenza vaccines  Home Medications   Prior to Admission medications   Medication Sig Start Date End Date Taking? Authorizing Provider  OVER THE COUNTER MEDICATION Take 1 capsule by mouth daily. Ultra flora IB probiotic   Yes Historical Provider, MD  Pediatric Multiple Vit-C-FA (MULTIVITAMIN ANIMAL SHAPES, WITH CA/FA,) WITH  C & FA CHEW Chew 1 tablet by mouth daily.   Yes Historical Provider, MD  fexofenadine (ALLEGRA) 180 MG tablet Take 180 mg by mouth daily as needed for allergies.     Historical Provider, MD  valACYclovir (VALTREX) 1000 MG tablet Take 1 tablet (1,000 mg total) by mouth 3 (three) times daily. 05/31/15 06/14/15  Konrad Felix, PA   Meds Ordered and Administered this Visit  Medications - No data to display  BP 180/90 mmHg  Pulse 69  Temp(Src) 98.7 F (37.1 C) (Oral)  Resp 16  SpO2 98% No data found.   Physical Exam  Skin: Rash noted. Rash is vesicular.     Nursing note and vitals reviewed.   ED Course  Procedures (including critical care time)  Labs Review Labs Reviewed - No data to display  Imaging Review No results found.   Visual Acuity Review  Right Eye Distance:   Left Eye Distance:     Bilateral Distance:    Right Eye Near:   Left Eye Near:    Bilateral Near:         MDM   1. Shingles outbreak    Patient is advised to continue home symptomatic treatment. Prescription for Valtrex  sent pharmacy patient has indicated. Patient is advised that if there are new or worsening symptoms or attend the emergency department, or contact primary care provider. Instructions of care provided discharged home in stable condition. Return to work/school note provided.  THIS NOTE WAS GENERATED USING A VOICE RECOGNITION SOFTWARE PROGRAM. ALL REASONABLE EFFORTS  WERE MADE TO PROOFREAD THIS DOCUMENT FOR ACCURACY.     Konrad Felix, PA 05/31/15 2049

## 2015-06-01 ENCOUNTER — Ambulatory Visit (INDEPENDENT_AMBULATORY_CARE_PROVIDER_SITE_OTHER): Payer: Medicare Other | Admitting: Internal Medicine

## 2015-06-01 ENCOUNTER — Encounter: Payer: Self-pay | Admitting: Internal Medicine

## 2015-06-01 VITALS — BP 138/86 | HR 83 | Temp 97.6°F | Resp 16 | Ht 59.0 in | Wt 111.0 lb

## 2015-06-01 DIAGNOSIS — I10 Essential (primary) hypertension: Secondary | ICD-10-CM

## 2015-06-01 DIAGNOSIS — B029 Zoster without complications: Secondary | ICD-10-CM

## 2015-06-01 DIAGNOSIS — N952 Postmenopausal atrophic vaginitis: Secondary | ICD-10-CM

## 2015-06-01 MED ORDER — OXYCODONE-ACETAMINOPHEN 5-325 MG PO TABS: 1.0000 | ORAL_TABLET | ORAL | Status: DC | PRN

## 2015-06-01 MED ORDER — ESTRADIOL 0.1 MG/GM VA CREA: 1.0000 | TOPICAL_CREAM | Freq: Every day | VAGINAL | Status: DC

## 2015-06-01 NOTE — Patient Instructions (Signed)

## 2015-06-01 NOTE — Progress Notes (Signed)
Pre visit review using our clinic review tool, if applicable. No additional management support is needed unless otherwise documented below in the visit note. 

## 2015-06-02 NOTE — Progress Notes (Signed)
Subjective:  Patient ID: Alyssa Robinson, female    DOB: [birth year]  Age: 65 y.o. MRN: [id number]  CC: Hypertension and Rash   HPI Alyssa Robinson presents for follow-up on painful rash over her right lower back and right abdomen for about a week. A few days ago she was seen at an urgent care center and was diagnosed with shingles and has been taking Valtrex. The rash is getting better but it it is painful and she was not given anything for pain relief.  She also complains of a 15 year history of worsening dyspareunia, vaginal dryness, occasional vaginal bleeding. She and her husband have tried multiple lubricants without much relief. She is agreeable to try estrogen vaginal cream.  She is also pleased to report that her blood pressure has been well controlled.  Outpatient Prescriptions Prior to Visit  Medication Sig Dispense Refill  . fexofenadine (ALLEGRA) 180 MG tablet Take 180 mg by mouth daily as needed for allergies.     Marland Kitchen OVER THE COUNTER MEDICATION Take 1 capsule by mouth daily. Ultra flora IB probiotic    . Pediatric Multiple Vit-C-FA (MULTIVITAMIN ANIMAL SHAPES, WITH CA/FA,) WITH C & FA CHEW Chew 1 tablet by mouth daily.    . valACYclovir (VALTREX) 1000 MG tablet Take 1 tablet (1,000 mg total) by mouth 3 (three) times daily. 21 tablet 0   No facility-administered medications prior to visit.    ROS Review of Systems  Constitutional: Negative.  Negative for fever, chills, diaphoresis, appetite change and fatigue.  HENT: Negative.   Eyes: Negative.   Respiratory: Negative.  Negative for cough, choking, chest tightness, shortness of breath and stridor.   Cardiovascular: Negative.  Negative for chest pain, palpitations and leg swelling.  Gastrointestinal: Negative.  Negative for nausea, vomiting, abdominal pain, diarrhea, constipation and blood in stool.  Endocrine: Negative.   Genitourinary: Positive for dyspareunia. Negative for vaginal bleeding, vaginal discharge, genital  sores, vaginal pain and pelvic pain.  Musculoskeletal: Negative.   Skin: Positive for rash.  Allergic/Immunologic: Negative.   Neurological: Negative.   Hematological: Negative.  Negative for adenopathy. Does not bruise/bleed easily.  Psychiatric/Behavioral: Negative.     Objective:  BP 138/86 mmHg  Pulse 83  Temp(Src) 97.6 F (36.4 C) (Oral)  Resp 16  Ht 4\' 11"  (1.499 m)  Wt 111 lb (50.349 kg)  BMI 22.41 kg/m2  SpO2 96%  BP Readings from Last 3 Encounters:  06/01/15 138/86  05/31/15 180/90  04/30/15 115/63    Wt Readings from Last 3 Encounters:  06/01/15 111 lb (50.349 kg)  04/30/15 109 lb (49.442 kg)  03/10/15 110 lb (49.896 kg)    Physical Exam  Constitutional: She is oriented to person, place, and time. She appears well-developed and well-nourished. No distress.  HENT:  Mouth/Throat: Oropharynx is clear and moist. No oropharyngeal exudate.  Eyes: Conjunctivae are normal. Right eye exhibits no discharge. Left eye exhibits no discharge. No scleral icterus.  Neck: Normal range of motion. Neck supple. No JVD present. No tracheal deviation present. No thyromegaly present.  Cardiovascular: Normal rate, regular rhythm, normal heart sounds and intact distal pulses.  Exam reveals no gallop and no friction rub.   No murmur heard. Pulmonary/Chest: Effort normal and breath sounds normal. No stridor. No respiratory distress. She has no wheezes. She has no rales. She exhibits no tenderness.  Abdominal: Soft. Bowel sounds are normal. She exhibits no distension. There is no tenderness. There is no rebound and no guarding.  Genitourinary:  Genitourinary  exam was deferred at her request  Musculoskeletal: Normal range of motion. She exhibits no edema or tenderness.  Lymphadenopathy:    She has no cervical adenopathy.  Neurological: She is oriented to person, place, and time.  Skin: Skin is warm and dry. Rash noted. No bruising, no petechiae and no purpura noted. Rash is vesicular.  Rash is not macular, not papular, not maculopapular, not nodular, not pustular and not urticarial. She is not diaphoretic. There is erythema. No pallor.     Psychiatric: She has a normal mood and affect. Her behavior is normal. Judgment and thought content normal.  Vitals reviewed.   Lab Results  Component Value Date   WBC 4.7 05/13/2014   HGB 14.7 05/13/2014   HCT 42.6 05/13/2014   PLT 157.0 05/13/2014   GLUCOSE 99 11/04/2014   CHOL 197 03/04/2014   TRIG 41.0 03/04/2014   HDL 71.10 03/04/2014   LDLDIRECT 121.5 12/11/2012   LDLCALC 118* 03/04/2014   ALT 18 03/04/2014   AST 24 03/04/2014   NA 141 11/04/2014   K 4.1 11/04/2014   CL 105 11/04/2014   CREATININE 0.82 11/04/2014   BUN 18 11/04/2014   CO2 28 11/04/2014   TSH 2.31 11/04/2014   INR 1.20 10/17/2012   HGBA1C 5.4 12/11/2012    No results found.  Assessment & Plan:   Tiahna was seen today for hypertension and rash.  Diagnoses and all orders for this visit:  Essential hypertension, benign- her blood pressure is well-controlled on no medications, I will continue to follow this.  Shingles outbreak- she will apply Neosporin topically, she will continue taking Valtrex, she will try oxycodone as needed for pain. -     oxyCODONE-acetaminophen (ROXICET) 5-325 MG tablet; Take 1 tablet by mouth every 4 (four) hours as needed for severe pain.  Vaginal atrophy- she agrees to start treating this with a daily application of estradiol vaginal cream -     estradiol (ESTRACE VAGINAL) 0.1 MG/GM vaginal cream; Place 1 Applicatorful vaginally at bedtime.   I am having Ms. Bechtel start on oxyCODONE-acetaminophen and estradiol. I am also having her maintain her multivitamin animal shapes (with Ca/FA), fexofenadine, OVER THE COUNTER MEDICATION, and valACYclovir.  Meds ordered this encounter  Medications  . oxyCODONE-acetaminophen (ROXICET) 5-325 MG tablet    Sig: Take 1 tablet by mouth every 4 (four) hours as needed for severe pain.     Dispense:  35 tablet    Refill:  0  . estradiol (ESTRACE VAGINAL) 0.1 MG/GM vaginal cream    Sig: Place 1 Applicatorful vaginally at bedtime.    Dispense:  42.5 g    Refill:  11     Follow-up: Return in about 3 months (around 08/29/2015).  Scarlette Calico, MD

## 2015-06-14 ENCOUNTER — Encounter: Payer: Self-pay | Admitting: Internal Medicine

## 2015-07-22 DIAGNOSIS — H40013 Open angle with borderline findings, low risk, bilateral: Secondary | ICD-10-CM | POA: Diagnosis not present

## 2015-09-14 ENCOUNTER — Other Ambulatory Visit (INDEPENDENT_AMBULATORY_CARE_PROVIDER_SITE_OTHER): Payer: Medicare Other

## 2015-09-14 ENCOUNTER — Encounter: Payer: Self-pay | Admitting: Internal Medicine

## 2015-09-14 ENCOUNTER — Ambulatory Visit (INDEPENDENT_AMBULATORY_CARE_PROVIDER_SITE_OTHER): Payer: Medicare Other | Admitting: Internal Medicine

## 2015-09-14 VITALS — BP 138/68 | HR 64 | Temp 97.9°F | Resp 16 | Ht 59.0 in | Wt 106.0 lb

## 2015-09-14 DIAGNOSIS — I10 Essential (primary) hypertension: Secondary | ICD-10-CM | POA: Diagnosis not present

## 2015-09-14 DIAGNOSIS — R3129 Other microscopic hematuria: Secondary | ICD-10-CM

## 2015-09-14 DIAGNOSIS — E785 Hyperlipidemia, unspecified: Secondary | ICD-10-CM | POA: Diagnosis not present

## 2015-09-14 DIAGNOSIS — K5909 Other constipation: Secondary | ICD-10-CM

## 2015-09-14 DIAGNOSIS — Z23 Encounter for immunization: Secondary | ICD-10-CM | POA: Diagnosis not present

## 2015-09-14 DIAGNOSIS — B029 Zoster without complications: Secondary | ICD-10-CM | POA: Insufficient documentation

## 2015-09-14 DIAGNOSIS — Z0000 Encounter for general adult medical examination without abnormal findings: Secondary | ICD-10-CM

## 2015-09-14 LAB — URINALYSIS, ROUTINE W REFLEX MICROSCOPIC
Bilirubin Urine: NEGATIVE
Ketones, ur: NEGATIVE
Leukocytes, UA: NEGATIVE
Nitrite: NEGATIVE
PH: 7.5 (ref 5.0–8.0)
Specific Gravity, Urine: <=1.005 — AB (ref 1.000–1.030)
TOTAL PROTEIN, URINE-UPE24: NEGATIVE
URINE GLUCOSE: NEGATIVE
Urobilinogen, UA: 0.2 (ref 0.0–1.0)
WBC, UA: NONE SEEN (ref 0–?)

## 2015-09-14 LAB — COMPREHENSIVE METABOLIC PANEL
ALK PHOS: 91 U/L (ref 39–117)
ALT: 19 U/L (ref 0–35)
AST: 21 U/L (ref 0–37)
Albumin: 4.6 g/dL (ref 3.5–5.2)
BILIRUBIN TOTAL: 0.8 mg/dL (ref 0.2–1.2)
BUN: 15 mg/dL (ref 6–23)
CO2: 27 meq/L (ref 19–32)
Calcium: 9.5 mg/dL (ref 8.4–10.5)
Chloride: 107 mEq/L (ref 96–112)
Creatinine, Ser: 0.7 mg/dL (ref 0.40–1.20)
GFR: 88.88 mL/min (ref >60.00)
GLUCOSE: 100 mg/dL — AB (ref 70–99)
Potassium: 4.2 mEq/L (ref 3.5–5.1)
SODIUM: 141 meq/L (ref 135–145)
TOTAL PROTEIN: 7.1 g/dL (ref 6.0–8.3)

## 2015-09-14 LAB — LIPID PANEL
CHOL/HDL RATIO: 3
Cholesterol: 201 mg/dL — ABNORMAL HIGH (ref 0–200)
HDL: 71.5 mg/dL (ref >39.00)
LDL Cholesterol: 116 mg/dL — ABNORMAL HIGH (ref 0–99)
NONHDL: 129.72
Triglycerides: 67 mg/dL (ref 0.0–149.0)
VLDL: 13.4 mg/dL (ref 0.0–40.0)

## 2015-09-14 LAB — CBC WITH DIFFERENTIAL/PLATELET
BASOS ABS: 0 10*3/uL (ref 0.0–0.1)
Basophils Relative: 0.7 % (ref 0.0–3.0)
EOS PCT: 1.5 % (ref 0.0–5.0)
Eosinophils Absolute: 0.1 10*3/uL (ref 0.0–0.7)
HCT: 43.4 % (ref 36.0–46.0)
Hemoglobin: 14.7 g/dL (ref 12.0–15.0)
LYMPHS ABS: 1.2 10*3/uL (ref 0.7–4.0)
Lymphocytes Relative: 28.1 % (ref 12.0–46.0)
MCHC: 33.9 g/dL (ref 30.0–36.0)
MCV: 96.1 fl (ref 78.0–100.0)
MONO ABS: 0.6 10*3/uL (ref 0.1–1.0)
MONOS PCT: 13.8 % — AB (ref 3.0–12.0)
NEUTROS ABS: 2.4 10*3/uL (ref 1.4–7.7)
NEUTROS PCT: 55.9 % (ref 43.0–77.0)
PLATELETS: 150 10*3/uL (ref 150.0–400.0)
RBC: 4.52 Mil/uL (ref 3.87–5.11)
RDW: 14.4 % (ref 11.5–15.5)
WBC: 4.2 10*3/uL (ref 4.0–10.5)

## 2015-09-14 LAB — TSH: TSH: 1.9 u[IU]/mL (ref 0.35–4.50)

## 2015-09-14 MED ORDER — VALACYCLOVIR HCL 1 G PO TABS: 1000.0000 mg | ORAL_TABLET | Freq: Two times a day (BID) | ORAL | Status: AC

## 2015-09-14 NOTE — Progress Notes (Signed)
Pre visit review using our clinic review tool, if applicable. No additional management support is needed unless otherwise documented below in the visit note. 

## 2015-09-14 NOTE — Patient Instructions (Signed)
Preventive Care for Adults, Female A healthy lifestyle and preventive care can promote health and wellness. Preventive health guidelines for women include the following key practices.  A routine yearly physical is a good way to check with your health care provider about your health and preventive screening. It is a chance to share any concerns and updates on your health and to receive a thorough exam.  Visit your dentist for a routine exam and preventive care every 6 months. Brush your teeth twice a day and floss once a day. Good oral hygiene prevents tooth decay and gum disease.  The frequency of eye exams is based on your age, health, family medical history, use of contact lenses, and other factors. Follow your health care provider's recommendations for frequency of eye exams.  Eat a healthy diet. Foods like vegetables, fruits, whole grains, low-fat dairy products, and lean protein foods contain the nutrients you need without too many calories. Decrease your intake of foods high in solid fats, added sugars, and salt. Eat the right amount of calories for you.Get information about a proper diet from your health care provider, if necessary.  Regular physical exercise is one of the most important things you can do for your health. Most adults should get at least 150 minutes of moderate-intensity exercise (any activity that increases your heart rate and causes you to sweat) each week. In addition, most adults need muscle-strengthening exercises on 2 or more days a week.  Maintain a healthy weight. The body mass index (BMI) is a screening tool to identify possible weight problems. It provides an estimate of body fat based on height and weight. Your health care provider can find your BMI and can help you achieve or maintain a healthy weight.For adults 20 years and older:  A BMI below 18.5 is considered underweight.  A BMI of 18.5 to 24.9 is normal.  A BMI of 25 to 29.9 is considered overweight.  A  BMI of 30 and above is considered obese.  Maintain normal blood lipids and cholesterol levels by exercising and minimizing your intake of saturated fat. Eat a balanced diet with plenty of fruit and vegetables. Blood tests for lipids and cholesterol should begin at age 45 and be repeated every 5 years. If your lipid or cholesterol levels are high, you are over 50, or you are at high risk for heart disease, you may need your cholesterol levels checked more frequently.Ongoing high lipid and cholesterol levels should be treated with medicines if diet and exercise are not working.  If you smoke, find out from your health care provider how to quit. If you do not use tobacco, do not start.  Lung cancer screening is recommended for adults aged 45-80 years who are at high risk for developing lung cancer because of a history of smoking. A yearly low-dose CT scan of the lungs is recommended for people who have at least a 30-pack-year history of smoking and are a current smoker or have quit within the past 15 years. A pack year of smoking is smoking an average of 1 pack of cigarettes a day for 1 year (for example: 1 pack a day for 30 years or 2 packs a day for 15 years). Yearly screening should continue until the smoker has stopped smoking for at least 15 years. Yearly screening should be stopped for people who develop a health problem that would prevent them from having lung cancer treatment.  If you are pregnant, do not drink alcohol. If you are  breastfeeding, be very cautious about drinking alcohol. If you are not pregnant and choose to drink alcohol, do not have more than 1 drink per day. One drink is considered to be 12 ounces (355 mL) of beer, 5 ounces (148 mL) of wine, or 1.5 ounces (44 mL) of liquor.  Avoid use of street drugs. Do not share needles with anyone. Ask for help if you need support or instructions about stopping the use of drugs.  High blood pressure causes heart disease and increases the risk  of stroke. Your blood pressure should be checked at least every 1 to 2 years. Ongoing high blood pressure should be treated with medicines if weight loss and exercise do not work.  If you are 55-79 years old, ask your health care provider if you should take aspirin to prevent strokes.  Diabetes screening is done by taking a blood sample to check your blood glucose level after you have not eaten for a certain period of time (fasting). If you are not overweight and you do not have risk factors for diabetes, you should be screened once every 3 years starting at age 45. If you are overweight or obese and you are 40-70 years of age, you should be screened for diabetes every year as part of your cardiovascular risk assessment.  Breast cancer screening is essential preventive care for women. You should practice "breast self-awareness." This means understanding the normal appearance and feel of your breasts and may include breast self-examination. Any changes detected, no matter how small, should be reported to a health care provider. Women in their 20s and 30s should have a clinical breast exam (CBE) by a health care provider as part of a regular health exam every 1 to 3 years. After age 40, women should have a CBE every year. Starting at age 40, women should consider having a mammogram (breast X-ray test) every year. Women who have a family history of breast cancer should talk to their health care provider about genetic screening. Women at a high risk of breast cancer should talk to their health care providers about having an MRI and a mammogram every year.  Breast cancer gene (BRCA)-related cancer risk assessment is recommended for women who have family members with BRCA-related cancers. BRCA-related cancers include breast, ovarian, tubal, and peritoneal cancers. Having family members with these cancers may be associated with an increased risk for harmful changes (mutations) in the breast cancer genes BRCA1 and  BRCA2. Results of the assessment will determine the need for genetic counseling and BRCA1 and BRCA2 testing.  Your health care provider may recommend that you be screened regularly for cancer of the pelvic organs (ovaries, uterus, and vagina). This screening involves a pelvic examination, including checking for microscopic changes to the surface of your cervix (Pap test). You may be encouraged to have this screening done every 3 years, beginning at age 21.  For women ages 30-65, health care providers may recommend pelvic exams and Pap testing every 3 years, or they may recommend the Pap and pelvic exam, combined with testing for human papilloma virus (HPV), every 5 years. Some types of HPV increase your risk of cervical cancer. Testing for HPV may also be done on women of any age with unclear Pap test results.  Other health care providers may not recommend any screening for nonpregnant women who are considered low risk for pelvic cancer and who do not have symptoms. Ask your health care provider if a screening pelvic exam is right for   you.  If you have had past treatment for cervical cancer or a condition that could lead to cancer, you need Pap tests and screening for cancer for at least 20 years after your treatment. If Pap tests have been discontinued, your risk factors (such as having a new sexual partner) need to be reassessed to determine if screening should resume. Some women have medical problems that increase the chance of getting cervical cancer. In these cases, your health care provider may recommend more frequent screening and Pap tests.  Colorectal cancer can be detected and often prevented. Most routine colorectal cancer screening begins at the age of 50 years and continues through age 75 years. However, your health care provider may recommend screening at an earlier age if you have risk factors for colon cancer. On a yearly basis, your health care provider may provide home test kits to check  for hidden blood in the stool. Use of a small camera at the end of a tube, to directly examine the colon (sigmoidoscopy or colonoscopy), can detect the earliest forms of colorectal cancer. Talk to your health care provider about this at age 50, when routine screening begins. Direct exam of the colon should be repeated every 5-10 years through age 75 years, unless early forms of precancerous polyps or small growths are found.  People who are at an increased risk for hepatitis B should be screened for this virus. You are considered at high risk for hepatitis B if:  You were born in a country where hepatitis B occurs often. Talk with your health care provider about which countries are considered high risk.  Your parents were born in a high-risk country and you have not received a shot to protect against hepatitis B (hepatitis B vaccine).  You have HIV or AIDS.  You use needles to inject street drugs.  You live with, or have sex with, someone who has hepatitis B.  You get hemodialysis treatment.  You take certain medicines for conditions like cancer, organ transplantation, and autoimmune conditions.  Hepatitis C blood testing is recommended for all people born from 1945 through 1965 and any individual with known risks for hepatitis C.  Practice safe sex. Use condoms and avoid high-risk sexual practices to reduce the spread of sexually transmitted infections (STIs). STIs include gonorrhea, chlamydia, syphilis, trichomonas, herpes, HPV, and human immunodeficiency virus (HIV). Herpes, HIV, and HPV are viral illnesses that have no cure. They can result in disability, cancer, and death.  You should be screened for sexually transmitted illnesses (STIs) including gonorrhea and chlamydia if:  You are sexually active and are younger than 24 years.  You are older than 24 years and your health care provider tells you that you are at risk for this type of infection.  Your sexual activity has changed  since you were last screened and you are at an increased risk for chlamydia or gonorrhea. Ask your health care provider if you are at risk.  If you are at risk of being infected with HIV, it is recommended that you take a prescription medicine daily to prevent HIV infection. This is called preexposure prophylaxis (PrEP). You are considered at risk if:  You are sexually active and do not regularly use condoms or know the HIV status of your partner(s).  You take drugs by injection.  You are sexually active with a partner who has HIV.  Talk with your health care provider about whether you are at high risk of being infected with HIV. If   you choose to begin PrEP, you should first be tested for HIV. You should then be tested every 3 months for as long as you are taking PrEP.  Osteoporosis is a disease in which the bones lose minerals and strength with aging. This can result in serious bone fractures or breaks. The risk of osteoporosis can be identified using a bone density scan. Women ages 67 years and over and women at risk for fractures or osteoporosis should discuss screening with their health care providers. Ask your health care provider whether you should take a calcium supplement or vitamin D to reduce the rate of osteoporosis.  Menopause can be associated with physical symptoms and risks. Hormone replacement therapy is available to decrease symptoms and risks. You should talk to your health care provider about whether hormone replacement therapy is right for you.  Use sunscreen. Apply sunscreen liberally and repeatedly throughout the day. You should seek shade when your shadow is shorter than you. Protect yourself by wearing long sleeves, pants, a wide-brimmed hat, and sunglasses year round, whenever you are outdoors.  Once a month, do a whole body skin exam, using a mirror to look at the skin on your back. Tell your health care provider of new moles, moles that have irregular borders, moles that  are larger than a pencil eraser, or moles that have changed in shape or color.  Stay current with required vaccines (immunizations).  Influenza vaccine. All adults should be immunized every year.  Tetanus, diphtheria, and acellular pertussis (Td, Tdap) vaccine. Pregnant women should receive 1 dose of Tdap vaccine during each pregnancy. The dose should be obtained regardless of the length of time since the last dose. Immunization is preferred during the 27th-36th week of gestation. An adult who has not previously received Tdap or who does not know her vaccine status should receive 1 dose of Tdap. This initial dose should be followed by tetanus and diphtheria toxoids (Td) booster doses every 10 years. Adults with an unknown or incomplete history of completing a 3-dose immunization series with Td-containing vaccines should begin or complete a primary immunization series including a Tdap dose. Adults should receive a Td booster every 10 years.  Varicella vaccine. An adult without evidence of immunity to varicella should receive 2 doses or a second dose if she has previously received 1 dose. Pregnant females who do not have evidence of immunity should receive the first dose after pregnancy. This first dose should be obtained before leaving the health care facility. The second dose should be obtained 4-8 weeks after the first dose.  Human papillomavirus (HPV) vaccine. Females aged 13-26 years who have not received the vaccine previously should obtain the 3-dose series. The vaccine is not recommended for use in pregnant females. However, pregnancy testing is not needed before receiving a dose. If a female is found to be pregnant after receiving a dose, no treatment is needed. In that case, the remaining doses should be delayed until after the pregnancy. Immunization is recommended for any person with an immunocompromised condition through the age of 61 years if she did not get any or all doses earlier. During the  3-dose series, the second dose should be obtained 4-8 weeks after the first dose. The third dose should be obtained 24 weeks after the first dose and 16 weeks after the second dose.  Zoster vaccine. One dose is recommended for adults aged 30 years or older unless certain conditions are present.  Measles, mumps, and rubella (MMR) vaccine. Adults born  before 1957 generally are considered immune to measles and mumps. Adults born in 1957 or later should have 1 or more doses of MMR vaccine unless there is a contraindication to the vaccine or there is laboratory evidence of immunity to each of the three diseases. A routine second dose of MMR vaccine should be obtained at least 28 days after the first dose for students attending postsecondary schools, health care workers, or international travelers. People who received inactivated measles vaccine or an unknown type of measles vaccine during 1963-1967 should receive 2 doses of MMR vaccine. People who received inactivated mumps vaccine or an unknown type of mumps vaccine before 1979 and are at high risk for mumps infection should consider immunization with 2 doses of MMR vaccine. For females of childbearing age, rubella immunity should be determined. If there is no evidence of immunity, females who are not pregnant should be vaccinated. If there is no evidence of immunity, females who are pregnant should delay immunization until after pregnancy. Unvaccinated health care workers born before 1957 who lack laboratory evidence of measles, mumps, or rubella immunity or laboratory confirmation of disease should consider measles and mumps immunization with 2 doses of MMR vaccine or rubella immunization with 1 dose of MMR vaccine.  Pneumococcal 13-valent conjugate (PCV13) vaccine. When indicated, a person who is uncertain of his immunization history and has no record of immunization should receive the PCV13 vaccine. All adults 65 years of age and older should receive this  vaccine. An adult aged 19 years or older who has certain medical conditions and has not been previously immunized should receive 1 dose of PCV13 vaccine. This PCV13 should be followed with a dose of pneumococcal polysaccharide (PPSV23) vaccine. Adults who are at high risk for pneumococcal disease should obtain the PPSV23 vaccine at least 8 weeks after the dose of PCV13 vaccine. Adults older than 65 years of age who have normal immune system function should obtain the PPSV23 vaccine dose at least 1 year after the dose of PCV13 vaccine.  Pneumococcal polysaccharide (PPSV23) vaccine. When PCV13 is also indicated, PCV13 should be obtained first. All adults aged 65 years and older should be immunized. An adult younger than age 65 years who has certain medical conditions should be immunized. Any person who resides in a nursing home or long-term care facility should be immunized. An adult smoker should be immunized. People with an immunocompromised condition and certain other conditions should receive both PCV13 and PPSV23 vaccines. People with human immunodeficiency virus (HIV) infection should be immunized as soon as possible after diagnosis. Immunization during chemotherapy or radiation therapy should be avoided. Routine use of PPSV23 vaccine is not recommended for American Indians, [place] Natives, or people younger than 65 years unless there are medical conditions that require PPSV23 vaccine. When indicated, people who have unknown immunization and have no record of immunization should receive PPSV23 vaccine. One-time revaccination 5 years after the first dose of PPSV23 is recommended for people aged 19-64 years who have chronic kidney failure, nephrotic syndrome, asplenia, or immunocompromised conditions. People who received 1-2 doses of PPSV23 before age 65 years should receive another dose of PPSV23 vaccine at age 65 years or later if at least 5 years have passed since the previous dose. Doses of PPSV23 are not  needed for people immunized with PPSV23 at or after age 65 years.  Meningococcal vaccine. Adults with asplenia or persistent complement component deficiencies should receive 2 doses of quadrivalent meningococcal conjugate (MenACWY-D) vaccine. The doses should be obtained   at least 2 months apart. Microbiologists working with certain meningococcal bacteria, [place] recruits, people at risk during an outbreak, and people who travel to or live in countries with a high rate of meningitis should be immunized. A first-year college student up through age 34 years who is living in a residence hall should receive a dose if she did not receive a dose on or after her 16th birthday. Adults who have certain high-risk conditions should receive one or more doses of vaccine.  Hepatitis A vaccine. Adults who wish to be protected from this disease, have certain high-risk conditions, work with hepatitis A-infected animals, work in hepatitis A research labs, or travel to or work in countries with a high rate of hepatitis A should be immunized. Adults who were previously unvaccinated and who anticipate close contact with an international adoptee during the first 60 days after arrival in the [place] from a country with a high rate of hepatitis A should be immunized.  Hepatitis B vaccine. Adults who wish to be protected from this disease, have certain high-risk conditions, may be exposed to blood or other infectious body fluids, are household contacts or sex partners of hepatitis B positive people, are clients or workers in certain care facilities, or travel to or work in countries with a high rate of hepatitis B should be immunized.  Haemophilus influenzae type b (Hib) vaccine. A previously unvaccinated person with asplenia or sickle cell disease or having a scheduled splenectomy should receive 1 dose of Hib vaccine. Regardless of previous immunization, a recipient of a hematopoietic stem cell transplant should receive a  3-dose series 6-12 months after her successful transplant. Hib vaccine is not recommended for adults with HIV infection. Preventive Services / Frequency Ages 35 to 4 years  Blood pressure check.** / Every 3-5 years.  Lipid and cholesterol check.** / Every 5 years beginning at age 60.  Clinical breast exam.** / Every 3 years for women in their 71s and 10s.  BRCA-related cancer risk assessment.** / For women who have family members with a BRCA-related cancer (breast, ovarian, tubal, or peritoneal cancers).  Pap test.** / Every 2 years from ages 76 through 26. Every 3 years starting at age 61 through age 76 or 93 with a history of 3 consecutive normal Pap tests.  HPV screening.** / Every 3 years from ages 37 through ages 60 to 51 with a history of 3 consecutive normal Pap tests.  Hepatitis C blood test.** / For any individual with known risks for hepatitis C.  Skin self-exam. / Monthly.  Influenza vaccine. / Every year.  Tetanus, diphtheria, and acellular pertussis (Tdap, Td) vaccine.** / Consult your health care provider. Pregnant women should receive 1 dose of Tdap vaccine during each pregnancy. 1 dose of Td every 10 years.  Varicella vaccine.** / Consult your health care provider. Pregnant females who do not have evidence of immunity should receive the first dose after pregnancy.  HPV vaccine. / 3 doses over 6 months, if 93 and younger. The vaccine is not recommended for use in pregnant females. However, pregnancy testing is not needed before receiving a dose.  Measles, mumps, rubella (MMR) vaccine.** / You need at least 1 dose of MMR if you were born in 1957 or later. You may also need a 2nd dose. For females of childbearing age, rubella immunity should be determined. If there is no evidence of immunity, females who are not pregnant should be vaccinated. If there is no evidence of immunity, females who are  pregnant should delay immunization until after pregnancy.  Pneumococcal  13-valent conjugate (PCV13) vaccine.** / Consult your health care provider.  Pneumococcal polysaccharide (PPSV23) vaccine.** / 1 to 2 doses if you smoke cigarettes or if you have certain conditions.  Meningococcal vaccine.** / 1 dose if you are age 68 to 8 years and a Market researcher living in a residence hall, or have one of several medical conditions, you need to get vaccinated against meningococcal disease. You may also need additional booster doses.  Hepatitis A vaccine.** / Consult your health care provider.  Hepatitis B vaccine.** / Consult your health care provider.  Haemophilus influenzae type b (Hib) vaccine.** / Consult your health care provider. Ages 7 to 53 years  Blood pressure check.** / Every year.  Lipid and cholesterol check.** / Every 5 years beginning at age 25 years.  Lung cancer screening. / Every year if you are aged 11-80 years and have a 30-pack-year history of smoking and currently smoke or have quit within the past 15 years. Yearly screening is stopped once you have quit smoking for at least 15 years or develop a health problem that would prevent you from having lung cancer treatment.  Clinical breast exam.** / Every year after age 48 years.  BRCA-related cancer risk assessment.** / For women who have family members with a BRCA-related cancer (breast, ovarian, tubal, or peritoneal cancers).  Mammogram.** / Every year beginning at age 41 years and continuing for as long as you are in good health. Consult with your health care provider.  Pap test.** / Every 3 years starting at age 65 years through age 37 or 70 years with a history of 3 consecutive normal Pap tests.  HPV screening.** / Every 3 years from ages 72 years through ages 60 to 40 years with a history of 3 consecutive normal Pap tests.  Fecal occult blood test (FOBT) of stool. / Every year beginning at age 21 years and continuing until age 5 years. You may not need to do this test if you get  a colonoscopy every 10 years.  Flexible sigmoidoscopy or colonoscopy.** / Every 5 years for a flexible sigmoidoscopy or every 10 years for a colonoscopy beginning at age 35 years and continuing until age 48 years.  Hepatitis C blood test.** / For all people born from [birth year] through 1965 and any individual with known risks for hepatitis C.  Skin self-exam. / Monthly.  Influenza vaccine. / Every year.  Tetanus, diphtheria, and acellular pertussis (Tdap/Td) vaccine.** / Consult your health care provider. Pregnant women should receive 1 dose of Tdap vaccine during each pregnancy. 1 dose of Td every 10 years.  Varicella vaccine.** / Consult your health care provider. Pregnant females who do not have evidence of immunity should receive the first dose after pregnancy.  Zoster vaccine.** / 1 dose for adults aged 30 years or older.  Measles, mumps, rubella (MMR) vaccine.** / You need at least 1 dose of MMR if you were born in 1957 or later. You may also need a second dose. For females of childbearing age, rubella immunity should be determined. If there is no evidence of immunity, females who are not pregnant should be vaccinated. If there is no evidence of immunity, females who are pregnant should delay immunization until after pregnancy.  Pneumococcal 13-valent conjugate (PCV13) vaccine.** / Consult your health care provider.  Pneumococcal polysaccharide (PPSV23) vaccine.** / 1 to 2 doses if you smoke cigarettes or if you have certain conditions.  Meningococcal vaccine.** /  Consult your health care provider.  Hepatitis A vaccine.** / Consult your health care provider.  Hepatitis B vaccine.** / Consult your health care provider.  Haemophilus influenzae type b (Hib) vaccine.** / Consult your health care provider. Ages 64 years and over  Blood pressure check.** / Every year.  Lipid and cholesterol check.** / Every 5 years beginning at age 23 years.  Lung cancer screening. / Every year if you  are aged 16-80 years and have a 30-pack-year history of smoking and currently smoke or have quit within the past 15 years. Yearly screening is stopped once you have quit smoking for at least 15 years or develop a health problem that would prevent you from having lung cancer treatment.  Clinical breast exam.** / Every year after age 74 years.  BRCA-related cancer risk assessment.** / For women who have family members with a BRCA-related cancer (breast, ovarian, tubal, or peritoneal cancers).  Mammogram.** / Every year beginning at age 44 years and continuing for as long as you are in good health. Consult with your health care provider.  Pap test.** / Every 3 years starting at age 58 years through age 22 or 39 years with 3 consecutive normal Pap tests. Testing can be stopped between 65 and 70 years with 3 consecutive normal Pap tests and no abnormal Pap or HPV tests in the past 10 years.  HPV screening.** / Every 3 years from ages 64 years through ages 70 or 61 years with a history of 3 consecutive normal Pap tests. Testing can be stopped between 65 and 70 years with 3 consecutive normal Pap tests and no abnormal Pap or HPV tests in the past 10 years.  Fecal occult blood test (FOBT) of stool. / Every year beginning at age 40 years and continuing until age 27 years. You may not need to do this test if you get a colonoscopy every 10 years.  Flexible sigmoidoscopy or colonoscopy.** / Every 5 years for a flexible sigmoidoscopy or every 10 years for a colonoscopy beginning at age 7 years and continuing until age 32 years.  Hepatitis C blood test.** / For all people born from [birth year] through 1966 and any individual with known risks for hepatitis C.  Osteoporosis screening.** / A one-time screening for women ages 30 years and over and women at risk for fractures or osteoporosis.  Skin self-exam. / Monthly.  Influenza vaccine. / Every year.  Tetanus, diphtheria, and acellular pertussis (Tdap/Td)  vaccine.** / 1 dose of Td every 10 years.  Varicella vaccine.** / Consult your health care provider.  Zoster vaccine.** / 1 dose for adults aged 35 years or older.  Pneumococcal 13-valent conjugate (PCV13) vaccine.** / Consult your health care provider.  Pneumococcal polysaccharide (PPSV23) vaccine.** / 1 dose for all adults aged 46 years and older.  Meningococcal vaccine.** / Consult your health care provider.  Hepatitis A vaccine.** / Consult your health care provider.  Hepatitis B vaccine.** / Consult your health care provider.  Haemophilus influenzae type b (Hib) vaccine.** / Consult your health care provider. ** Family history and personal history of risk and conditions may change your health care provider's recommendations.   This information is not intended to replace advice given to you by your health care provider. Make sure you discuss any questions you have with your health care provider.   Document Released: 06/06/2001 Document Revised: 05/01/2014 Document Reviewed: 09/05/2010 Elsevier Interactive Patient Education [employer].

## 2015-09-14 NOTE — Progress Notes (Signed)
Subjective:  Patient ID: Alyssa Robinson, female    DOB: 1951/04/21  Age: 65 y.o. MRN: [id number]  CC: Annual Exam; Hyperlipidemia; Rash; and Hypertension   HPI Alyssa Robinson presents for a CPX.  She complains of a 4 day history of rash on her left upper back that she describes as feeling irritated. She doesn't relate much pain or itching. She denies any injury or trauma.  She tells me her blood pressure has been well controlled on no medications, she denies headache/blurred vision/chest pain/shortness of breath/edema/or fatigue.  Outpatient Prescriptions Prior to Visit  Medication Sig Dispense Refill  . fexofenadine (ALLEGRA) 180 MG tablet Take 180 mg by mouth daily as needed for allergies.     Marland Kitchen OVER THE COUNTER MEDICATION Take 1 capsule by mouth daily. Ultra flora IB probiotic    . Pediatric Multiple Vit-C-FA (MULTIVITAMIN ANIMAL SHAPES, WITH CA/FA,) WITH C & FA CHEW Chew 1 tablet by mouth daily.    Marland Kitchen estradiol (ESTRACE VAGINAL) 0.1 MG/GM vaginal cream Place 1 Applicatorful vaginally at bedtime. (Patient not taking: Reported on 09/14/2015) 42.5 g 11  . oxyCODONE-acetaminophen (ROXICET) 5-325 MG tablet Take 1 tablet by mouth every 4 (four) hours as needed for severe pain. 35 tablet 0   No facility-administered medications prior to visit.    ROS Review of Systems  Constitutional: Negative.  Negative for fever, chills, fatigue and unexpected weight change.  HENT: Negative.  Negative for sinus pressure and sore throat.   Eyes: Negative.   Respiratory: Negative.  Negative for cough, choking, chest tightness, shortness of breath and stridor.   Cardiovascular: Negative.  Negative for chest pain, palpitations and leg swelling.  Gastrointestinal: Positive for constipation. Negative for nausea, vomiting, abdominal pain and diarrhea.  Endocrine: Negative.   Genitourinary: Negative.   Musculoskeletal: Negative.  Negative for myalgias, back pain, arthralgias and neck pain.  Skin: Positive  for rash. Negative for color change, pallor and wound.  Allergic/Immunologic: Negative.   Neurological: Negative.  Negative for dizziness, tremors, weakness and light-headedness.  Hematological: Negative.  Negative for adenopathy. Does not bruise/bleed easily.  Psychiatric/Behavioral: Negative.     Objective:  BP 138/68 mmHg  Pulse 64  Temp(Src) 97.9 F (36.6 C) (Oral)  Resp 16  Ht 4\' 11"  (1.499 m)  Wt 106 lb (48.081 kg)  BMI 21.40 kg/m2  SpO2 96%  BP Readings from Last 3 Encounters:  09/14/15 138/68  06/01/15 138/86  05/31/15 180/90    Wt Readings from Last 3 Encounters:  09/14/15 106 lb (48.081 kg)  06/01/15 111 lb (50.349 kg)  04/30/15 109 lb (49.442 kg)    Physical Exam  Constitutional: She is oriented to person, place, and time. No distress.  HENT:  Mouth/Throat: Oropharynx is clear and moist. No oropharyngeal exudate.  Eyes: Conjunctivae are normal. Right eye exhibits no discharge. Left eye exhibits no discharge. No scleral icterus.  Neck: Normal range of motion. Neck supple. No JVD present. No tracheal deviation present. No thyromegaly present.  Cardiovascular: Normal rate, regular rhythm, normal heart sounds and intact distal pulses.  Exam reveals no gallop and no friction rub.   No murmur heard. Pulmonary/Chest: Effort normal and breath sounds normal. No stridor. No respiratory distress. She has no wheezes. She has no rales. She exhibits no tenderness.  Abdominal: Soft. Bowel sounds are normal. She exhibits no distension and no mass. There is no tenderness. There is no rebound and no guarding.  Musculoskeletal: Normal range of motion. She exhibits no edema or tenderness.  Arms: Lymphadenopathy:    She has no cervical adenopathy.  Neurological: She is oriented to person, place, and time.  Skin: Skin is warm and dry. Rash noted. Rash is vesicular. Rash is not nodular. She is not diaphoretic. There is erythema. No pallor.  Vitals reviewed.   Lab Results    Component Value Date   WBC 4.2 09/14/2015   HGB 14.7 09/14/2015   HCT 43.4 09/14/2015   PLT 150.0 09/14/2015   GLUCOSE 100* 09/14/2015   CHOL 201* 09/14/2015   TRIG 67.0 09/14/2015   HDL 71.50 09/14/2015   LDLDIRECT 121.5 12/11/2012   LDLCALC 116* 09/14/2015   ALT 19 09/14/2015   AST 21 09/14/2015   NA 141 09/14/2015   K 4.2 09/14/2015   CL 107 09/14/2015   CREATININE 0.70 09/14/2015   BUN 15 09/14/2015   CO2 27 09/14/2015   TSH 1.90 09/14/2015   INR 1.20 10/17/2012   HGBA1C 5.4 12/11/2012    No results found.  Assessment & Plan:   Nesreen was seen today for annual exam, hyperlipidemia, rash and hypertension.  Diagnoses and all orders for this visit:  Need for Tdap vaccination -     Tdap vaccine greater than or equal to 7yo IM  Need for 23-polyvalent pneumococcal polysaccharide vaccine -     Pneumococcal polysaccharide vaccine 23-valent greater than or equal to 2yo subcutaneous/IM  Hematuria, microscopic- this is about a benign issue for her, it is been chronic in nature and an MRI of the abdomen done about 5 or 6 months ago showed that the kidneys and bladder were normal -     Urinalysis, Routine w reflex microscopic (not at [facility]); Future  Other constipation- her labs show no secondary causes for constipation, this doesn't bother her enough to warrant treatment -     TSH; Future  Essential hypertension, benign- her blood pressure is well-controlled, labs do not show any secondary causes or end organ damage from hypertension, at her request she will remain off therapy at this time. -     Comprehensive metabolic panel; Future -     CBC with Differential/Platelet; Future -     TSH; Future -     Urinalysis, Routine w reflex microscopic (not at [facility]); Future  Hyperlipidemia with target LDL less than 130- her Framingham risk was only 3% so I do not recommend that she start a statin -     Lipid panel; Future -     Comprehensive metabolic panel; Future -     TSH;  Future  Shingles rash -     valACYclovir (VALTREX) 1000 MG tablet; Take 1 tablet (1,000 mg total) by mouth 2 (two) times daily.   I have discontinued Ms. Dooley's oxyCODONE-acetaminophen. I am also having her start on valACYclovir. Additionally, I am having her maintain her multivitamin animal shapes (with Ca/FA), fexofenadine, OVER THE COUNTER MEDICATION, and estradiol.  Meds ordered this encounter  Medications  . valACYclovir (VALTREX) 1000 MG tablet    Sig: Take 1 tablet (1,000 mg total) by mouth 2 (two) times daily.    Dispense:  20 tablet    Refill:  1   See AVS for instructions about healthy living and anticipatory guidance.  Follow-up: Return in about 3 weeks (around 10/05/2015).  Scarlette Calico, MD

## 2015-09-15 NOTE — Assessment & Plan Note (Signed)

## 2015-10-11 ENCOUNTER — Other Ambulatory Visit: Payer: Self-pay | Admitting: Internal Medicine

## 2015-11-05 ENCOUNTER — Ambulatory Visit (INDEPENDENT_AMBULATORY_CARE_PROVIDER_SITE_OTHER): Payer: Medicare Other | Admitting: Internal Medicine

## 2015-11-05 ENCOUNTER — Encounter: Payer: Self-pay | Admitting: Internal Medicine

## 2015-11-05 VITALS — BP 118/60 | HR 74 | Temp 98.3°F | Resp 16 | Ht 59.0 in | Wt 108.0 lb

## 2015-11-05 DIAGNOSIS — R2232 Localized swelling, mass and lump, left upper limb: Secondary | ICD-10-CM

## 2015-11-05 DIAGNOSIS — I10 Essential (primary) hypertension: Secondary | ICD-10-CM | POA: Diagnosis not present

## 2015-11-05 DIAGNOSIS — R2230 Localized swelling, mass and lump, unspecified upper limb: Secondary | ICD-10-CM | POA: Insufficient documentation

## 2015-11-05 NOTE — Progress Notes (Signed)
Subjective:  Patient ID: CAYLEN VICENS, female    DOB: 1950/07/24  Age: 65 y.o. MRN: [id number]  CC: Hypertension   HPI MARRIETTA DEMARIO presents for a blood pressure check as well as a concern about a mass on the dorsum of her left distal forearm. She tells me it has been there for about 8 years. She previously saw another physician about it and was told that a plain x-ray was normal. She is concerned - she thinks it may have grown over the last few months. It is not painful. The overlying skin is normal.  Outpatient Prescriptions Prior to Visit  Medication Sig Dispense Refill  . fexofenadine (ALLEGRA) 180 MG tablet Take 180 mg by mouth daily as needed for allergies.     Marland Kitchen OVER THE COUNTER MEDICATION Take 1 capsule by mouth daily. Ultra flora IB probiotic    . Pediatric Multiple Vit-C-FA (MULTIVITAMIN ANIMAL SHAPES, WITH CA/FA,) WITH C & FA CHEW Chew 1 tablet by mouth daily.    Marland Kitchen estradiol (ESTRACE VAGINAL) 0.1 MG/GM vaginal cream Place 1 Applicatorful vaginally at bedtime. (Patient not taking: Reported on 09/14/2015) 42.5 g 11   No facility-administered medications prior to visit.    ROS Review of Systems  Constitutional: Negative.  Negative for fatigue.  HENT: Negative.   Eyes: Negative.  Negative for visual disturbance.  Respiratory: Negative.  Negative for cough, choking, chest tightness, shortness of breath and stridor.   Cardiovascular: Negative.  Negative for chest pain, palpitations and leg swelling.  Gastrointestinal: Negative.  Negative for nausea, vomiting, abdominal pain, diarrhea and constipation.  Endocrine: Negative.   Genitourinary: Negative.  Negative for dysuria, hematuria, decreased urine volume and difficulty urinating.  Musculoskeletal: Negative.  Negative for back pain and neck pain.  Skin: Negative.  Negative for rash.  Allergic/Immunologic: Negative.   Neurological: Negative.  Negative for dizziness, weakness and light-headedness.  Hematological: Negative.   Negative for adenopathy. Does not bruise/bleed easily.  Psychiatric/Behavioral: Negative.     Objective:  BP 118/60 mmHg  Pulse 74  Temp(Src) 98.3 F (36.8 C) (Oral)  Resp 16  Ht 4\' 11"  (1.499 m)  Wt 108 lb (48.988 kg)  BMI 21.80 kg/m2  SpO2 98%  BP Readings from Last 3 Encounters:  11/05/15 118/60  09/14/15 138/68  06/01/15 138/86    Wt Readings from Last 3 Encounters:  11/05/15 108 lb (48.988 kg)  09/14/15 106 lb (48.081 kg)  06/01/15 111 lb (50.349 kg)    Physical Exam  Constitutional: She is oriented to person, place, and time. No distress.  HENT:  Mouth/Throat: Oropharynx is clear and moist. No oropharyngeal exudate.  Eyes: Conjunctivae are normal. Right eye exhibits no discharge. Left eye exhibits no discharge. No scleral icterus.  Neck: Normal range of motion. Neck supple. No JVD present. No tracheal deviation present. No thyromegaly present.  Cardiovascular: Normal rate, regular rhythm, normal heart sounds and intact distal pulses.  Exam reveals no gallop and no friction rub.   No murmur heard. Pulmonary/Chest: Effort normal and breath sounds normal. No stridor. No respiratory distress. She has no wheezes. She has no rales. She exhibits no tenderness.  Abdominal: Soft. Bowel sounds are normal. She exhibits no distension and no mass. There is no tenderness. There is no rebound and no guarding.  Musculoskeletal: Normal range of motion. She exhibits no edema or tenderness.       Arms: Lymphadenopathy:    She has no cervical adenopathy.  Neurological: She is oriented to person, place, and  time.  Skin: Skin is warm and dry. No rash noted. She is not diaphoretic. No erythema. No pallor.  Vitals reviewed.   Lab Results  Component Value Date   WBC 4.2 09/14/2015   HGB 14.7 09/14/2015   HCT 43.4 09/14/2015   PLT 150.0 09/14/2015   GLUCOSE 100* 09/14/2015   CHOL 201* 09/14/2015   TRIG 67.0 09/14/2015   HDL 71.50 09/14/2015   LDLDIRECT 121.5 12/11/2012   LDLCALC  116* 09/14/2015   ALT 19 09/14/2015   AST 21 09/14/2015   NA 141 09/14/2015   K 4.2 09/14/2015   CL 107 09/14/2015   CREATININE 0.70 09/14/2015   BUN 15 09/14/2015   CO2 27 09/14/2015   TSH 1.90 09/14/2015   INR 1.20 10/17/2012   HGBA1C 5.4 12/11/2012    No results found.  Assessment & Plan:   Khylah was seen today for hypertension.  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 -     MR Forearm Left W/O Cm; Future  I have discontinued Ms. Castor's estradiol. I am also having her maintain her multivitamin animal shapes (with Ca/FA), fexofenadine, OVER THE COUNTER MEDICATION, and Probiotic Product (PROBIOTIC DAILY PO).  Meds ordered this encounter  Medications  . Probiotic Product (PROBIOTIC DAILY PO)    Sig: Take by mouth. OTC Ultraflora IB     Follow-up: Return if symptoms worsen or fail to improve.  Scarlette Calico, MD

## 2015-11-05 NOTE — Patient Instructions (Signed)

## 2015-11-05 NOTE — Progress Notes (Signed)
Pre visit review using our clinic review tool, if applicable. No additional management support is needed unless otherwise documented below in the visit note. 

## 2015-11-13 ENCOUNTER — Ambulatory Visit
Admission: RE | Admit: 2015-11-13 | Discharge: 2015-11-13 | Disposition: A | Discharge status: Home / Self Care | Payer: Medicare Other | Source: Ambulatory Visit | Attending: Internal Medicine | Admitting: Internal Medicine

## 2015-11-13 ENCOUNTER — Encounter: Payer: Self-pay | Admitting: Internal Medicine

## 2015-11-13 ENCOUNTER — Other Ambulatory Visit: Payer: Self-pay | Admitting: Internal Medicine

## 2015-11-13 DIAGNOSIS — R2232 Localized swelling, mass and lump, left upper limb: Secondary | ICD-10-CM

## 2015-11-18 DIAGNOSIS — R2232 Localized swelling, mass and lump, left upper limb: Secondary | ICD-10-CM | POA: Diagnosis not present

## 2015-12-08 DIAGNOSIS — R222 Localized swelling, mass and lump, trunk: Secondary | ICD-10-CM | POA: Diagnosis not present

## 2015-12-08 DIAGNOSIS — R2232 Localized swelling, mass and lump, left upper limb: Secondary | ICD-10-CM | POA: Diagnosis not present

## 2015-12-10 DIAGNOSIS — R2232 Localized swelling, mass and lump, left upper limb: Secondary | ICD-10-CM | POA: Diagnosis not present

## 2015-12-17 DIAGNOSIS — D1722 Benign lipomatous neoplasm of skin and subcutaneous tissue of left arm: Secondary | ICD-10-CM | POA: Diagnosis not present

## 2015-12-17 DIAGNOSIS — I10 Essential (primary) hypertension: Secondary | ICD-10-CM | POA: Diagnosis not present

## 2015-12-17 DIAGNOSIS — D1730 Benign lipomatous neoplasm of skin and subcutaneous tissue of unspecified sites: Secondary | ICD-10-CM | POA: Diagnosis not present

## 2015-12-29 DIAGNOSIS — H6122 Impacted cerumen, left ear: Secondary | ICD-10-CM | POA: Diagnosis not present

## 2016-01-19 ENCOUNTER — Ambulatory Visit (INDEPENDENT_AMBULATORY_CARE_PROVIDER_SITE_OTHER): Payer: Medicare Other | Admitting: Internal Medicine

## 2016-01-19 VITALS — BP 118/70 | HR 78 | Temp 98.0°F | Resp 20 | Wt 109.4 lb

## 2016-01-19 DIAGNOSIS — H9191 Unspecified hearing loss, right ear: Secondary | ICD-10-CM | POA: Diagnosis not present

## 2016-01-19 DIAGNOSIS — I10 Essential (primary) hypertension: Secondary | ICD-10-CM

## 2016-01-19 NOTE — Assessment & Plan Note (Signed)
Improved with wax impaction irrigation,  to f/u any worsening symptoms or concerns 

## 2016-01-19 NOTE — Patient Instructions (Signed)
Your ear was irrigated of wax today  Please continue all other medications as before, including the treatments with peroxide and the irrigation at home as well  Please have the pharmacy call with any other refills you may need.  Please keep your appointments with your specialists as you may have planned

## 2016-01-19 NOTE — Progress Notes (Signed)
Pre visit review using our clinic review tool, if applicable. No additional management support is needed unless otherwise documented below in the visit note. 

## 2016-01-19 NOTE — Assessment & Plan Note (Signed)
stable overall by history and exam, recent data reviewed with pt, and pt to continue medical treatment as before,  to f/u any worsening symptoms or concerns BP Readings from Last 3 Encounters:  01/19/16 118/70  11/05/15 118/60  09/14/15 138/68

## 2016-01-19 NOTE — Progress Notes (Signed)
Subjective:    Patient ID: Alyssa Robinson, female    DOB: 11/08/1950, 64 y.o.   MRN: [id number]  HPI  Here with sudden onset 1 wk right hearing loss, ? Wax impaction, has long hx of wax accumulation and impactions, no fever, ear pain, ST, cough and Pt denies chest pain, increased sob or doe, wheezing, orthopnea, PND, increased LE swelling, palpitations, dizziness or syncope.   Past Medical History:  Diagnosis Date  . Anemia    hx  . Bronchitis   . Bronchitis    hx  . Diverticulosis   . GERD (gastroesophageal reflux disease)    occ  . Headache(784.0)    migraines occ  . Hypertension    no rx for 2 yrs  . Seasonal allergies    Past Surgical History:  Procedure Laterality Date  . ABDOMINAL HYSTERECTOMY     65 y/o  . APPENDECTOMY  03/17/2013   Procedure: INCIDENTAL APPENDECTOMY;  Surgeon: Gwenyth Ober, MD;  Location: [place];  Service: General;;  . capsule endoscopy    . COLONOSCOPY     in 2012 -   . COLOSTOMY CLOSURE  03/17/2013  . COLOSTOMY REVERSAL  03/17/2013   Procedure: COLOSTOMY REVERSAL;  Surgeon: Gwenyth Ober, MD;  Location: [place];  Service: General;;  . EUS N/A 04/30/2015   Procedure: UPPER ENDOSCOPIC ULTRASOUND (EUS) LINEAR;  Surgeon: Carol Ada, MD;  Location: [facility];  Service: Endoscopy;  Laterality: N/A;  . HERNIA REPAIR     X2 2-4 y/o inguinal  . LAPAROTOMY N/A 10/17/2012   Procedure: EXPLORATORY LAPAROTOMY,  COLON RESECTION AND COLOSTOMY;  Surgeon: Gwenyth Ober, MD;  Location: [place];  Service: General;  Laterality: N/A;  . LYSIS OF ADHESION  03/17/2013   Procedure: LYSIS OF ADHESION;  Surgeon: Gwenyth Ober, MD;  Location: [place];  Service: General;;  . UPPER GASTROINTESTINAL ENDOSCOPY     2013 -    reports that she has never smoked. She has never used smokeless tobacco. She reports that she drinks about 1.2 oz of alcohol per week . She reports that she does not use drugs. family history includes Alcohol abuse in her father and mother; Arthritis in her  mother; Colon cancer in her other; Diabetes in her father; Early death in her father; Esophageal cancer in her mother; Heart disease in her father; Hypertension in her father; Pancreatic cancer in her other. Allergies  Allergen Reactions  . Caffeine Palpitations    Heart racing  . Influenza Vaccines Nausea And Vomiting    Dizziness   Current Outpatient Prescriptions on File Prior to Visit  Medication Sig Dispense Refill  . fexofenadine (ALLEGRA) 180 MG tablet Take 180 mg by mouth daily as needed for allergies.     Marland Kitchen OVER THE COUNTER MEDICATION Take 1 capsule by mouth daily. Ultra flora IB probiotic    . Pediatric Multiple Vit-C-FA (MULTIVITAMIN ANIMAL SHAPES, WITH CA/FA,) WITH C & FA CHEW Chew 1 tablet by mouth daily.    . Probiotic Product (PROBIOTIC DAILY PO) Take by mouth. OTC Ultraflora IB     No current facility-administered medications on file prior to visit.    Review of Systems All otherwise neg per pt     Objective:   Physical Exam BP 118/70   Pulse 78   Temp 98 F (36.7 C) (Oral)   Resp 20   Wt 109 lb 6 oz (49.6 kg)   SpO2 97%   BMI 22.09 kg/m  VS noted,  Constitutional: Pt appears in no apparent distress HENT: Head: NCAT.  Right Ear: External ear normal.  Left Ear: External ear normal.  Eyes: . Pupils are equal, round, and reactive to light. Conjunctivae and EOM are normal Right ear canal wax impaction resolved with irrigation, hearing improved Neck: Normal range of motion. Neck supple.  Cardiovascular: Normal rate and regular rhythm.   Pulmonary/Chest: Effort normal and breath sounds without rales or wheezing.  Neurological: Pt is alert. Not confused , motor grossly intact Skin: Skin is warm. No rash, no LE edema Psychiatric: Pt behavior is normal. No agitation.     Assessment & Plan:

## 2016-03-29 DIAGNOSIS — H2513 Age-related nuclear cataract, bilateral: Secondary | ICD-10-CM | POA: Diagnosis not present

## 2016-03-29 DIAGNOSIS — H40013 Open angle with borderline findings, low risk, bilateral: Secondary | ICD-10-CM | POA: Diagnosis not present

## 2016-04-03 ENCOUNTER — Other Ambulatory Visit: Payer: Self-pay | Admitting: Internal Medicine

## 2016-04-03 ENCOUNTER — Encounter: Payer: Self-pay | Admitting: Internal Medicine

## 2016-04-03 DIAGNOSIS — K862 Cyst of pancreas: Secondary | ICD-10-CM

## 2016-05-02 ENCOUNTER — Ambulatory Visit
Admission: RE | Admit: 2016-05-02 | Discharge: 2016-05-02 | Disposition: A | Discharge status: Home / Self Care | Payer: Medicare Other | Source: Ambulatory Visit | Attending: Internal Medicine | Admitting: Internal Medicine

## 2016-05-02 ENCOUNTER — Encounter: Payer: Self-pay | Admitting: Internal Medicine

## 2016-05-02 DIAGNOSIS — K8689 Other specified diseases of pancreas: Secondary | ICD-10-CM | POA: Diagnosis not present

## 2016-05-02 DIAGNOSIS — K862 Cyst of pancreas: Secondary | ICD-10-CM

## 2016-05-02 MED ORDER — GADOBENATE DIMEGLUMINE 529 MG/ML IV SOLN
9.0000 mL | Freq: Once | INTRAVENOUS | Status: AC | PRN
  Administered 2016-05-02: 9 mL via INTRAVENOUS

## 2016-05-09 ENCOUNTER — Encounter: Payer: Self-pay | Admitting: Internal Medicine

## 2016-05-09 NOTE — Telephone Encounter (Signed)
Sent pt a message.   We do have the Flublok that is egg free.

## 2016-05-30 DIAGNOSIS — Z1231 Encounter for screening mammogram for malignant neoplasm of breast: Secondary | ICD-10-CM | POA: Diagnosis not present

## 2016-09-19 ENCOUNTER — Ambulatory Visit (INDEPENDENT_AMBULATORY_CARE_PROVIDER_SITE_OTHER): Payer: Medicare Other | Admitting: Internal Medicine

## 2016-09-19 ENCOUNTER — Encounter: Payer: Self-pay | Admitting: Internal Medicine

## 2016-09-19 ENCOUNTER — Other Ambulatory Visit (INDEPENDENT_AMBULATORY_CARE_PROVIDER_SITE_OTHER): Payer: Medicare Other

## 2016-09-19 VITALS — BP 120/70 | HR 73 | Temp 97.8°F | Resp 16 | Ht 59.0 in | Wt 109.8 lb

## 2016-09-19 DIAGNOSIS — I10 Essential (primary) hypertension: Secondary | ICD-10-CM

## 2016-09-19 DIAGNOSIS — E785 Hyperlipidemia, unspecified: Secondary | ICD-10-CM

## 2016-09-19 DIAGNOSIS — H6122 Impacted cerumen, left ear: Secondary | ICD-10-CM | POA: Diagnosis not present

## 2016-09-19 DIAGNOSIS — Z0000 Encounter for general adult medical examination without abnormal findings: Secondary | ICD-10-CM

## 2016-09-19 DIAGNOSIS — E2839 Other primary ovarian failure: Secondary | ICD-10-CM

## 2016-09-19 LAB — LIPID PANEL
CHOLESTEROL: 207 mg/dL — AB (ref 0–200)
HDL: 80.7 mg/dL (ref >39.00)
LDL Cholesterol: 112 mg/dL — ABNORMAL HIGH (ref 0–99)
NonHDL: 126.2
Total CHOL/HDL Ratio: 3
Triglycerides: 70 mg/dL (ref 0.0–149.0)
VLDL: 14 mg/dL (ref 0.0–40.0)

## 2016-09-19 LAB — BASIC METABOLIC PANEL
BUN: 14 mg/dL (ref 6–23)
CO2: 27 mEq/L (ref 19–32)
Calcium: 9.5 mg/dL (ref 8.4–10.5)
Chloride: 107 mEq/L (ref 96–112)
Creatinine, Ser: 0.67 mg/dL (ref 0.40–1.20)
GFR: 93.21 mL/min (ref >60.00)
Glucose, Bld: 91 mg/dL (ref 70–99)
POTASSIUM: 4 meq/L (ref 3.5–5.1)
Sodium: 142 mEq/L (ref 135–145)

## 2016-09-19 MED ORDER — ZOSTER VAC RECOMB ADJUVANTED 50 MCG/0.5ML IM SUSR: 0.5000 mL | Freq: Once | INTRAMUSCULAR | 1 refills | Status: AC

## 2016-09-19 NOTE — Progress Notes (Signed)
Subjective:  Patient ID: Alyssa Robinson, female    DOB: 01-Mar-1951  Age: 66 y.o. MRN: [id number]  CC: Hypertension; Hyperlipidemia; and Ear Pain   HPI Alyssa Robinson presents for f/up - she complains of a vague discomfort and LOH in left ear for the last few months. She offers no other complaints today.  Outpatient Medications Prior to Visit  Medication Sig Dispense Refill  . fexofenadine (ALLEGRA) 180 MG tablet Take 180 mg by mouth daily as needed for allergies.     Marland Kitchen OVER THE COUNTER MEDICATION Take 1 capsule by mouth daily. Ultra flora IB probiotic    . Pediatric Multiple Vit-C-FA (MULTIVITAMIN ANIMAL SHAPES, WITH CA/FA,) WITH C & FA CHEW Chew 1 tablet by mouth daily.    . Probiotic Product (PROBIOTIC DAILY PO) Take by mouth. OTC Ultraflora IB     No facility-administered medications prior to visit.     ROS Review of Systems  Constitutional: Negative.  Negative for activity change, appetite change, diaphoresis, fatigue and unexpected weight change.  HENT: Positive for hearing loss. Negative for ear discharge, ear pain, facial swelling, sinus pressure, sore throat and trouble swallowing.   Eyes: Negative for visual disturbance.  Respiratory: Negative for cough, chest tightness, shortness of breath and wheezing.   Cardiovascular: Negative for chest pain, palpitations and leg swelling.  Gastrointestinal: Negative for abdominal pain, blood in stool, constipation, diarrhea, nausea and vomiting.  Endocrine: Negative.   Genitourinary: Negative.  Negative for difficulty urinating.  Musculoskeletal: Negative.  Negative for back pain and neck pain.  Skin: Negative.   Allergic/Immunologic: Negative.   Neurological: Negative.  Negative for dizziness and weakness.  Hematological: Negative for adenopathy. Does not bruise/bleed easily.  Psychiatric/Behavioral: Negative.     Objective:  BP 120/70 (BP Location: Left Arm, Patient Position: Sitting, Cuff Size: Normal)   Pulse 73   Temp 97.8  F (36.6 C) (Oral)   Resp 16   Ht '4\' 11"'  (1.499 m)   Wt 109 lb 12 oz (49.8 kg)   SpO2 100%   BMI 22.17 kg/m   BP Readings from Last 3 Encounters:  09/19/16 120/70  01/19/16 118/70  11/05/15 118/60    Wt Readings from Last 3 Encounters:  09/19/16 109 lb 12 oz (49.8 kg)  01/19/16 109 lb 6 oz (49.6 kg)  11/05/15 108 lb (49 kg)    Physical Exam  Constitutional: She is oriented to person, place, and time. No distress.  HENT:  Right Ear: Hearing, tympanic membrane, external ear and ear canal normal.  Left Ear: Hearing, tympanic membrane and external ear normal. A foreign body (cerumen impaction) is present.  Mouth/Throat: Oropharynx is clear and moist. No oropharyngeal exudate.  I put Colace in her left ear. I then irrigated it and used an ear wick to remove a large piece of wax. She tolerated this well. Her hearing is normal now and all the other symptoms have resolved. Examination of the left TM and EAC are normal now.  Eyes: Conjunctivae are normal. Right eye exhibits no discharge. Left eye exhibits no discharge. No scleral icterus.  Neck: Normal range of motion. Neck supple. No JVD present. No tracheal deviation present. No thyromegaly present.  Cardiovascular: Normal rate, regular rhythm, normal heart sounds and intact distal pulses.  Exam reveals no gallop and no friction rub.   No murmur heard. Pulmonary/Chest: Effort normal and breath sounds normal. No stridor. No respiratory distress. She has no wheezes. She has no rales. She exhibits no tenderness.  Abdominal:  Soft. Bowel sounds are normal. She exhibits no distension and no mass. There is no tenderness. There is no rebound and no guarding.  Musculoskeletal: She exhibits no edema or tenderness.  Lymphadenopathy:    She has no cervical adenopathy.  Neurological: She is oriented to person, place, and time.  Skin: Skin is warm and dry. No rash noted. She is not diaphoretic. No erythema. No pallor.  Vitals reviewed.   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 Creatinine was obtained on site at [facility] at [address] Results: Creatinine 0.7 mg/dL. EXAM: MRI ABDOMEN WITHOUT AND WITH CONTRAST TECHNIQUE: Multiplanar multisequence MR imaging of the abdomen was performed both before and after the administration of intravenous contrast. CONTRAST:  33m MULTIHANCE GADOBENATE DIMEGLUMINE 529 MG/ML IV SOLN COMPARISON:  MRI abdomen 04/07/2015; CT abdomen 03/16/2014. FINDINGS: Lower chest: Unremarkable Hepatobiliary: Unchanged 2.3 cm cyst within the posterior right hepatic lobe. Liver is normal in size and contour. No cholelithiasis. No biliary ductal dilatation. Pancreas: Grossly unchanged 14 x 6 mm cystic lesion within the uncinate process of the pancreas (image 33; series 8). Cyst appears to communicate with the minor duct. Spleen:  Within normal limits in size and appearance. Adrenals/Urinary Tract: No masses identified. No evidence of hydronephrosis. Stomach/Bowel: Visualized portions within the abdomen are unremarkable. Vascular/Lymphatic: No pathologically enlarged lymph nodes identified. No abdominal aortic aneurysm demonstrated. Other:  None. Musculoskeletal: No suspicious bone lesions identified. IMPRESSION: Grossly unchanged 14 x 6 mm cystic lesion within the uncinate process which appears to  communicate with the minor duct, raising the possibility of IPMN. Follow-up MRI without and with IV contrast is recommended in 12 months. Hepatic cyst. Electronically Signed   By: DLovey NewcomerM.D.   On: 05/02/2016 14:22    Assessment & Plan:   BDawnewas seen today for hypertension, hyperlipidemia and ear pain.  Diagnoses and all orders for this visit:  Essential hypertension, benign- Her blood pressure is well-controlled, this does not need to be treated. -     Basic metabolic panel; Future  Hyperlipidemia with target LDL less than 130- she has a low Framingham risk score so I do not recommend that she start taking a statin for cardiovascular risk reduction. -     Lipid panel; Future  Hearing loss due to cerumen impaction, left- symptoms have resolved status post removal of the cerumen impaction  Other orders -     Zoster Vac Recomb Adjuvanted ([facility] injection; Inject 0.5 mLs into the muscle once.   I have discontinued Ms. Domingo's multivitamin animal shapes (with Ca/FA), OVER THE COUNTER MEDICATION, and Probiotic Product (PROBIOTIC DAILY PO). I am also having her start on Zoster Vac Recomb Adjuvanted. Additionally, I am having her maintain  her fexofenadine.  Meds ordered this encounter  Medications  . Zoster Vac Recomb Adjuvanted [facility]) injection    Sig: Inject 0.5 mLs into the muscle once.    Dispense:  1 each    Refill:  1     Follow-up: No Follow-up on file.  Scarlette Calico, MD

## 2016-09-19 NOTE — Progress Notes (Signed)
Pre visit review using our clinic review tool, if applicable. No additional management support is needed unless otherwise documented below in the visit note. 

## 2016-09-19 NOTE — Progress Notes (Addendum)
Subjective:   Alyssa Robinson is a 66 y.o. female who presents for Medicare Annual (Subsequent) preventive examination.  Review of Systems:  No ROS.  Medicare Wellness Visit.  Cardiac Risk Factors include: advanced age (>8men, >40 women);dyslipidemia;hypertension Sleep patterns: no sleep issues, feels rested on waking, gets up 1-2 times nightly to void and sleeps 6-8 hours nightly.   Home Safety/Smoke Alarms: Feels safe in home. Smoke alarms in place.   Living environment; residence and Firearm Safety: 2-story house, no firearms. Lives with husband, no DME needed Seat Belt Safety/Bike Helmet: Wears seat belt.   Counseling:   Eye Exam- appointment yearly Dental- appointment every 6 months   Female:   Pap- N/A      Mammo- Last  05/26/15,  BI-RADS category 2: Benign Dexa scan-  None, referral placed today      CCS- Last 12/05/13, recall 10 years      Objective:     Vitals: BP 120/70 (BP Location: Left Arm, Patient Position: Sitting, Cuff Size: Normal)   Pulse 73   Temp 97.8 F (36.6 C) (Oral)   Resp 16   Ht 4\' 11"  (1.499 m)   Wt 109 lb 12 oz (49.8 kg)   SpO2 100%   BMI 22.17 kg/m   Body mass index is 22.17 kg/m.   Tobacco History  Smoking Status  . Never Smoker  Smokeless Tobacco  . Never Used    Comment: EXPOSED TO SECOND HAND SMOKE      Counseling given: Not Answered   Past Medical History:  Diagnosis Date  . Anemia    hx  . Bronchitis   . Bronchitis    hx  . Diverticulosis   . GERD (gastroesophageal reflux disease)    occ  . Headache(784.0)    migraines occ  . Hypertension    no rx for 2 yrs  . Seasonal allergies    Past Surgical History:  Procedure Laterality Date  . ABDOMINAL HYSTERECTOMY     66 y/o  . APPENDECTOMY  03/17/2013   Procedure: INCIDENTAL APPENDECTOMY;  Surgeon: Gwenyth Ober, MD;  Location: [place];  Service: General;;  . capsule endoscopy    . COLONOSCOPY     in 2012 -   . COLOSTOMY CLOSURE  03/17/2013  . COLOSTOMY REVERSAL   03/17/2013   Procedure: COLOSTOMY REVERSAL;  Surgeon: Gwenyth Ober, MD;  Location: [place];  Service: General;;  . EUS N/A 04/30/2015   Procedure: UPPER ENDOSCOPIC ULTRASOUND (EUS) LINEAR;  Surgeon: Carol Ada, MD;  Location: [facility];  Service: Endoscopy;  Laterality: N/A;  . HERNIA REPAIR     X2 2-4 y/o inguinal  . LAPAROTOMY N/A 10/17/2012   Procedure: EXPLORATORY LAPAROTOMY,  COLON RESECTION AND COLOSTOMY;  Surgeon: Gwenyth Ober, MD;  Location: [place];  Service: General;  Laterality: N/A;  . LYSIS OF ADHESION  03/17/2013   Procedure: LYSIS OF ADHESION;  Surgeon: Gwenyth Ober, MD;  Location: [place];  Service: General;;  . UPPER GASTROINTESTINAL ENDOSCOPY     2013 -   Family History  Problem Relation Age of Onset  . Esophageal cancer Mother        and ovarian cancer  . Alcohol abuse Mother   . Arthritis Mother   . Heart disease Father   . Alcohol abuse Father   . Hypertension Father   . Diabetes Father   . Early death Father   . Colon cancer Other   . Pancreatic cancer Other   .  Stroke Neg Hx   . Hyperlipidemia Neg Hx   . Kidney disease Neg Hx   . COPD Neg Hx    History  Sexual Activity  . Sexual activity: Not Currently    Outpatient Encounter Prescriptions as of 09/19/2016  Medication Sig  . fexofenadine (ALLEGRA) 180 MG tablet Take 180 mg by mouth daily as needed for allergies.   . [DISCONTINUED] OVER THE COUNTER MEDICATION Take 1 capsule by mouth daily. Ultra flora IB probiotic  . [DISCONTINUED] Pediatric Multiple Vit-C-FA (MULTIVITAMIN ANIMAL SHAPES, WITH CA/FA,) WITH C & FA CHEW Chew 1 tablet by mouth daily.  . [DISCONTINUED] Probiotic Product (PROBIOTIC DAILY PO) Take by mouth. OTC Ultraflora IB  . Zoster Vac Recomb Adjuvanted [facility]) injection Inject 0.5 mLs into the muscle once.   No facility-administered encounter medications on file as of 09/19/2016.     Activities of Daily Living In your present state of health, do you have any difficulty performing  the following activities: 09/19/2016  Hearing? N  Vision? N  Difficulty concentrating or making decisions? N  Walking or climbing stairs? N  Dressing or bathing? N  Doing errands, shopping? N  Preparing Food and eating ? N  Using the Toilet? N  In the past six months, have you accidently leaked urine? N  Do you have problems with loss of bowel control? N  Managing your Medications? N  Managing your Finances? N  Housekeeping or managing your Housekeeping? N  Some recent data might be hidden    Patient Care Team: Janith Lima, MD as PCP - General (Internal Medicine)    Assessment:    Physical assessment deferred to PCP.  Exercise Activities and Dietary recommendations Current Exercise Habits: Home exercise routine, Type of exercise: strength training/weights;walking;calisthenics, Time (Minutes): 50, Frequency (Times/Week): 5, Weekly Exercise (Minutes/Week): 250, Intensity: Mild, Exercise limited by: None identified  Diet (meal preparation, eat out, water intake, caffeinated beverages, dairy products, fruits and vegetables): in general, a "healthy" diet  , well balanced, low fat/ cholesterol, low salt eats a variety of fruits and vegetables daily, limits salt, fat/cholesterol, sugar, caffeine, drinks 6-8 glasses of water daily.   Goals    . Maintain current health status          Continue to enjoy life and family.      Fall Risk Fall Risk  09/19/2016 09/15/2015 11/04/2014 05/13/2014  Falls in the past year? No No No No   Depression Screen PHQ 2/9 Scores 09/19/2016 09/15/2015 11/04/2014 05/13/2014  PHQ - 2 Score 0 0 0 0  PHQ- 9 Score 1 - - -     Cognitive Function       Ad8 score reviewed for issues:  Issues making decisions: no  Less interest in hobbies / activities: no  Repeats questions, stories (family complaining): no  Trouble using ordinary gadgets (microwave, computer, phone): no  Forgets the month or year: no  Mismanaging finances: no  Remembering appts:  no  Daily problems with thinking and/or memory: no Ad8 score is= 0  Immunization History  Administered Date(s) Administered  . Pneumococcal Conjugate-13 05/13/2014  . Pneumococcal Polysaccharide-23 09/14/2015  . Rabies, IM 12/07/2011, 12/10/2011, 12/14/2011, 12/21/2011  . Rabies, intradermal 12/07/2011  . Tdap 11/23/2005, 09/14/2015  . Zoster 11/11/2009   Screening Tests Health Maintenance  Topic Date Due  . DEXA SCAN  05/05/2014  . MAMMOGRAM  05/25/2017  . COLONOSCOPY  12/06/2023  . TETANUS/TDAP  09/13/2025  . Hepatitis C Screening  Completed  . PNA vac  Low Risk Adult  Completed      Plan:    Continue to eat heart healthy diet (full of fruits, vegetables, whole grains, lean protein, water--limit salt, fat, and sugar intake) and increase physical activity as tolerated.  Continue doing brain stimulating activities (puzzles, reading, adult coloring books, staying active) to keep memory sharp.   I have personally reviewed and noted the following in the patient's chart:   . Medical and social history . Use of alcohol, tobacco or illicit drugs  . Current medications and supplements . Functional ability and status . Nutritional status . Physical activity . Advanced directives . List of other physicians . Vitals . Screenings to include cognitive, depression, and falls . Referrals and appointments  In addition, I have reviewed and discussed with patient certain preventive protocols, quality metrics, and best practice recommendations. A written personalized care plan for preventive services as well as general preventive health recommendations were provided to patient.     Michiel Cowboy, RN  09/19/2016  Medical screening examination/treatment/procedure(s) were performed by non-physician practitioner and as supervising physician I was immediately available for consultation/collaboration. I agree with above. Scarlette Calico, MD

## 2016-09-19 NOTE — Patient Instructions (Addendum)
Continue to eat heart healthy diet (full of fruits, vegetables, whole grains, lean protein, water--limit salt, fat, and sugar intake) and increase physical activity as tolerated.  Continue doing brain stimulating activities (puzzles, reading, adult coloring books, staying active) to keep memory sharp.    Alyssa Robinson , Thank you for taking time to come for your Medicare Wellness Visit. I appreciate your ongoing commitment to your health goals. Please review the following plan we discussed and let me know if I can assist you in the future.   These are the goals we discussed: Goals    . Maintain current health status          Continue to enjoy life and family.       This is a list of the screening recommended for you and due dates:  Health Maintenance  Topic Date Due  . DEXA scan (bone density measurement)  05/05/2014  . Mammogram  05/25/2017  . Colon Cancer Screening  12/06/2023  . Tetanus Vaccine  09/13/2025  .  Hepatitis C: One time screening is recommended by Center for Disease Control  (CDC) for  adults born from [birth year] through 1966.   Completed  . Pneumonia vaccines  Completed     Health Maintenance, Female Adopting a healthy lifestyle and getting preventive care can go a long way to promote health and wellness. Talk with your health care provider about what schedule of regular examinations is right for you. This is a good chance for you to check in with your provider about disease prevention and staying healthy. In between checkups, there are plenty of things you can do on your own. Experts have done a lot of research about which lifestyle changes and preventive measures are most likely to keep you healthy. Ask your health care provider for more information. Weight and diet Eat a healthy diet  Be sure to include plenty of vegetables, fruits, low-fat dairy products, and lean protein.  Do not eat a lot of foods high in solid fats, added sugars, or salt.  Get regular exercise. This  is one of the most important things you can do for your health.  Most adults should exercise for at least 150 minutes each week. The exercise should increase your heart rate and make you sweat (moderate-intensity exercise).  Most adults should also do strengthening exercises at least twice a week. This is in addition to the moderate-intensity exercise. Maintain a healthy weight  Body mass index (BMI) is a measurement that can be used to identify possible weight problems. It estimates body fat based on height and weight. Your health care provider can help determine your BMI and help you achieve or maintain a healthy weight.  For females 59 years of age and older:  A BMI below 18.5 is considered underweight.  A BMI of 18.5 to 24.9 is normal.  A BMI of 25 to 29.9 is considered overweight.  A BMI of 30 and above is considered obese. Watch levels of cholesterol and blood lipids  You should start having your blood tested for lipids and cholesterol at 67 years of age, then have this test every 5 years.  You may need to have your cholesterol levels checked more often if:  Your lipid or cholesterol levels are high.  You are older than 67 years of age.  You are at high risk for heart disease. Cancer screening Lung Cancer  Lung cancer screening is recommended for adults 110-50 years old who are at high risk for lung  cancer because of a history of smoking.  A yearly low-dose CT scan of the lungs is recommended for people who:  Currently smoke.  Have quit within the past 15 years.  Have at least a 30-pack-year history of smoking. A pack year is smoking an average of one pack of cigarettes a day for 1 year.  Yearly screening should continue until it has been 15 years since you quit.  Yearly screening should stop if you develop a health problem that would prevent you from having lung cancer treatment. Breast Cancer  Practice breast self-awareness. This means understanding how your  breasts normally appear and feel.  It also means doing regular breast self-exams. Let your health care provider know about any changes, no matter how small.  If you are in your 20s or 30s, you should have a clinical breast exam (CBE) by a health care provider every 1-3 years as part of a regular health exam.  If you are 37 or older, have a CBE every year. Also consider having a breast X-ray (mammogram) every year.  If you have a family history of breast cancer, talk to your health care provider about genetic screening.  If you are at high risk for breast cancer, talk to your health care provider about having an MRI and a mammogram every year.  Breast cancer gene (BRCA) assessment is recommended for women who have family members with BRCA-related cancers. BRCA-related cancers include:  Breast.  Ovarian.  Tubal.  Peritoneal cancers.  Results of the assessment will determine the need for genetic counseling and BRCA1 and BRCA2 testing. Cervical Cancer  Your health care provider may recommend that you be screened regularly for cancer of the pelvic organs (ovaries, uterus, and vagina). This screening involves a pelvic examination, including checking for microscopic changes to the surface of your cervix (Pap test). You may be encouraged to have this screening done every 3 years, beginning at age 82.  For women ages 60-65, health care providers may recommend pelvic exams and Pap testing every 3 years, or they may recommend the Pap and pelvic exam, combined with testing for human papilloma virus (HPV), every 5 years. Some types of HPV increase your risk of cervical cancer. Testing for HPV may also be done on women of any age with unclear Pap test results.  Other health care providers may not recommend any screening for nonpregnant women who are considered low risk for pelvic cancer and who do not have symptoms. Ask your health care provider if a screening pelvic exam is right for you.  If you  have had past treatment for cervical cancer or a condition that could lead to cancer, you need Pap tests and screening for cancer for at least 20 years after your treatment. If Pap tests have been discontinued, your risk factors (such as having a new sexual partner) need to be reassessed to determine if screening should resume. Some women have medical problems that increase the chance of getting cervical cancer. In these cases, your health care provider may recommend more frequent screening and Pap tests. Colorectal Cancer  This type of cancer can be detected and often prevented.  Routine colorectal cancer screening usually begins at 67 years of age and continues through 67 years of age.  Your health care provider may recommend screening at an earlier age if you have risk factors for colon cancer.  Your health care provider may also recommend using home test kits to check for hidden blood in the stool.  A small camera at the end of a tube can be used to examine your colon directly (sigmoidoscopy or colonoscopy). This is done to check for the earliest forms of colorectal cancer.  Routine screening usually begins at age 40.  Direct examination of the colon should be repeated every 5-10 years through 67 years of age. However, you may need to be screened more often if early forms of precancerous polyps or small growths are found. Skin Cancer  Check your skin from head to toe regularly.  Tell your health care provider about any new moles or changes in moles, especially if there is a change in a mole's shape or color.  Also tell your health care provider if you have a mole that is larger than the size of a pencil eraser.  Always use sunscreen. Apply sunscreen liberally and repeatedly throughout the day.  Protect yourself by wearing long sleeves, pants, a wide-brimmed hat, and sunglasses whenever you are outside. Heart disease, diabetes, and high blood pressure  High blood pressure causes heart  disease and increases the risk of stroke. High blood pressure is more likely to develop in:  People who have blood pressure in the high end of the normal range (130-139/85-89 mm Hg).  People who are overweight or obese.  People who are African American.  If you are 91-35 years of age, have your blood pressure checked every 3-5 years. If you are 57 years of age or older, have your blood pressure checked every year. You should have your blood pressure measured twice-once when you are at a hospital or clinic, and once when you are not at a hospital or clinic. Record the average of the two measurements. To check your blood pressure when you are not at a hospital or clinic, you can use:  An automated blood pressure machine at a pharmacy.  A home blood pressure monitor.  If you are between 48 years and 65 years old, ask your health care provider if you should take aspirin to prevent strokes.  Have regular diabetes screenings. This involves taking a blood sample to check your fasting blood sugar level.  If you are at a normal weight and have a low risk for diabetes, have this test once every three years after 67 years of age.  If you are overweight and have a high risk for diabetes, consider being tested at a younger age or more often. Preventing infection Hepatitis B  If you have a higher risk for hepatitis B, you should be screened for this virus. You are considered at high risk for hepatitis B if:  You were born in a country where hepatitis B is common. Ask your health care provider which countries are considered high risk.  Your parents were born in a high-risk country, and you have not been immunized against hepatitis B (hepatitis B vaccine).  You have HIV or AIDS.  You use needles to inject street drugs.  You live with someone who has hepatitis B.  You have had sex with someone who has hepatitis B.  You get hemodialysis treatment.  You take certain medicines for conditions,  including cancer, organ transplantation, and autoimmune conditions. Hepatitis C  Blood testing is recommended for:  Everyone born from [birth year] through 1965.  Anyone with known risk factors for hepatitis C. Sexually transmitted infections (STIs)  You should be screened for sexually transmitted infections (STIs) including gonorrhea and chlamydia if:  You are sexually active and are younger than 67 years of age.  You are older than 67 years of age and your health care provider tells you that you are at risk for this type of infection.  Your sexual activity has changed since you were last screened and you are at an increased risk for chlamydia or gonorrhea. Ask your health care provider if you are at risk.  If you do not have HIV, but are at risk, it may be recommended that you take a prescription medicine daily to prevent HIV infection. This is called pre-exposure prophylaxis (PrEP). You are considered at risk if:  You are sexually active and do not regularly use condoms or know the HIV status of your partner(s).  You take drugs by injection.  You are sexually active with a partner who has HIV. Talk with your health care provider about whether you are at high risk of being infected with HIV. If you choose to begin PrEP, you should first be tested for HIV. You should then be tested every 3 months for as long as you are taking PrEP. Pregnancy  If you are premenopausal and you may become pregnant, ask your health care provider about preconception counseling.  If you may become pregnant, take 400 to 800 micrograms (mcg) of folic acid every day.  If you want to prevent pregnancy, talk to your health care provider about birth control (contraception). Osteoporosis and menopause  Osteoporosis is a disease in which the bones lose minerals and strength with aging. This can result in serious bone fractures. Your risk for osteoporosis can be identified using a bone density scan.  If you are 73  years of age or older, or if you are at risk for osteoporosis and fractures, ask your health care provider if you should be screened.  Ask your health care provider whether you should take a calcium or vitamin D supplement to lower your risk for osteoporosis.  Menopause may have certain physical symptoms and risks.  Hormone replacement therapy may reduce some of these symptoms and risks. Talk to your health care provider about whether hormone replacement therapy is right for you. Follow these instructions at home:  Schedule regular health, dental, and eye exams.  Stay current with your immunizations.  Do not use any tobacco products including cigarettes, chewing tobacco, or electronic cigarettes.  If you are pregnant, do not drink alcohol.  If you are breastfeeding, limit how much and how often you drink alcohol.  Limit alcohol intake to no more than 1 drink per day for nonpregnant women. One drink equals 12 ounces of beer, 5 ounces of wine, or 1 ounces of hard liquor.  Do not use street drugs.  Do not share needles.  Ask your health care provider for help if you need support or information about quitting drugs.  Tell your health care provider if you often feel depressed.  Tell your health care provider if you have ever been abused or do not feel safe at home. This information is not intended to replace advice given to you by your health care provider. Make sure you discuss any questions you have with your health care provider. Document Released: 10/24/2010 Document Revised: 09/16/2015 Document Reviewed: 01/12/2015 Elsevier Interactive Patient Education  2017 [employer].

## 2016-10-05 DIAGNOSIS — L290 Pruritus ani: Secondary | ICD-10-CM | POA: Diagnosis not present

## 2016-10-05 DIAGNOSIS — K625 Hemorrhage of anus and rectum: Secondary | ICD-10-CM | POA: Diagnosis not present

## 2016-10-05 DIAGNOSIS — Z800 Family history of malignant neoplasm of digestive organs: Secondary | ICD-10-CM | POA: Diagnosis not present

## 2016-10-10 ENCOUNTER — Ambulatory Visit (INDEPENDENT_AMBULATORY_CARE_PROVIDER_SITE_OTHER)
Admission: RE | Admit: 2016-10-10 | Discharge: 2016-10-10 | Disposition: A | Discharge status: Home / Self Care | Payer: Medicare Other | Source: Ambulatory Visit | Attending: Internal Medicine | Admitting: Internal Medicine

## 2016-10-10 DIAGNOSIS — E2839 Other primary ovarian failure: Secondary | ICD-10-CM | POA: Diagnosis not present

## 2017-01-27 IMAGING — MR MR FOREARM*L* W/O CM
5 series · 38 of 40 positions shown · non-contrast
Comparison: None.

CLINICAL DATA: Mass on the left forearm for 5-8 years, increasing
in size.

EXAM:
MRI OF THE LEFT FOREARM WITHOUT CONTRAST
TECHNIQUE: Multiplanar, multisequence MR imaging was performed. No intravenous
contrast was administered.

[Series 5: T1 · axial · left · 4.0mm · 0.28mm/px · z∈[-96,+69]mm · 10 of 37 slices shown (1 of 2)]
[im 1/37]
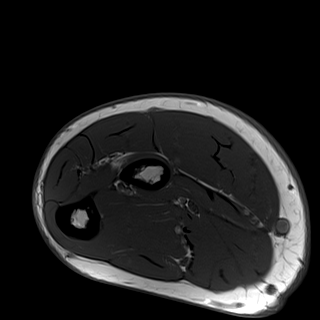
[im 4/37]
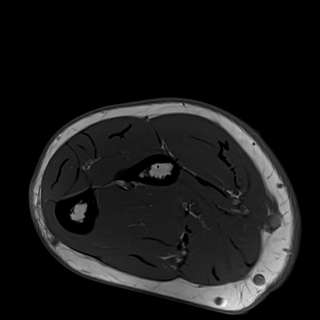
[im 7/37]
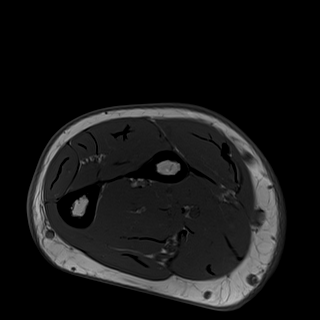
[im 10/37]
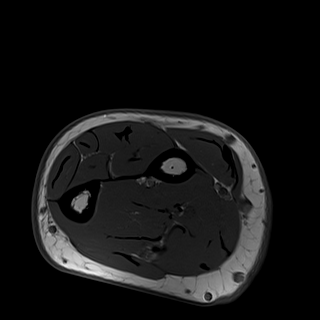
[im 17/37]
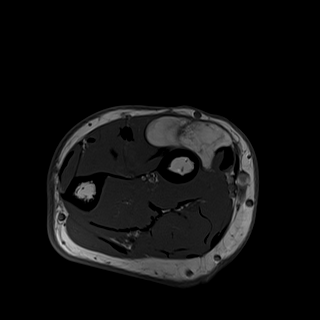
[im 20/37]
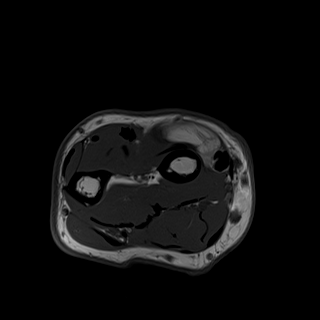
[im 27/37]
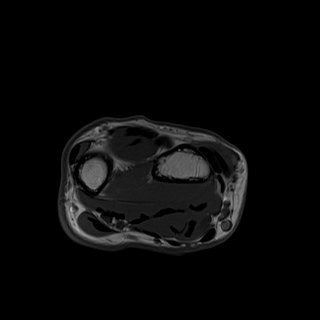
[im 30/37]
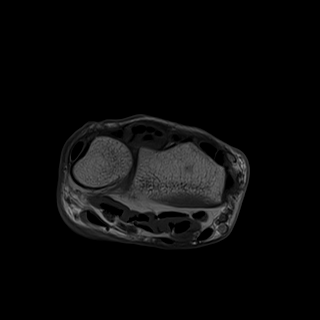
[im 33/37]
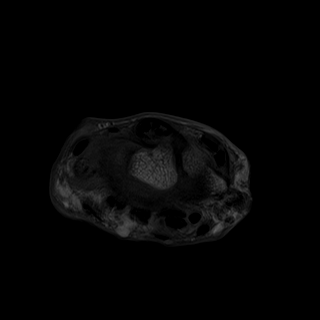
[im 37/37]
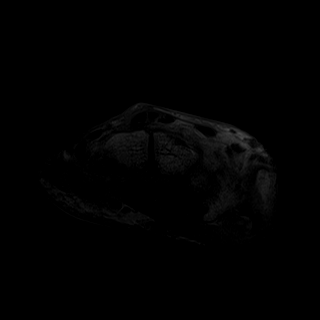

[Series 6: T2 fat-sat · axial · left · 4.0mm · 0.28mm/px · z∈[-83,+82]mm · 11 of 37 slices shown (1 of 3)]
[im 1/37]
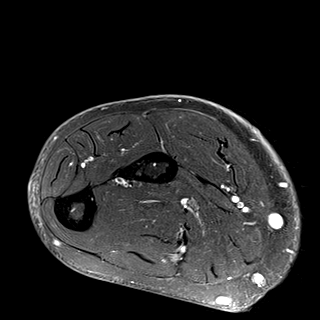
[im 4/37]
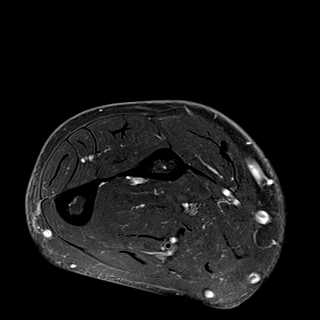
[im 8/37]
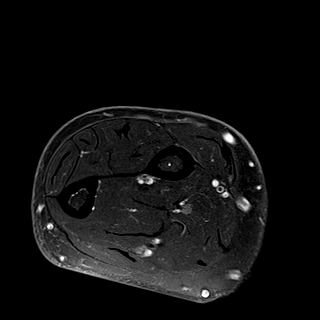
[im 11/37]
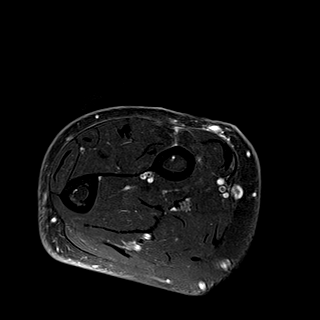
[im 15/37]
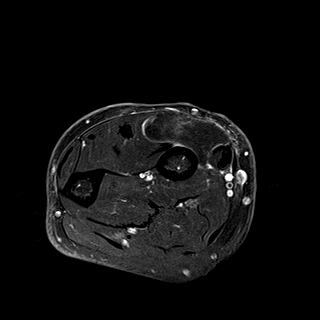
[im 19/37]
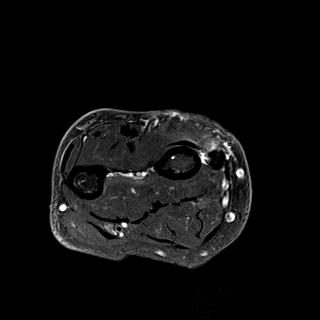
[im 22/37]
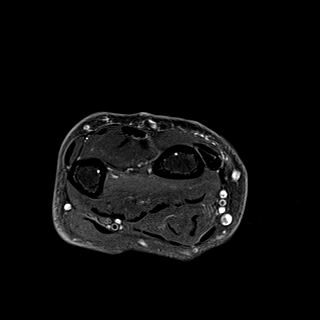
[im 26/37]
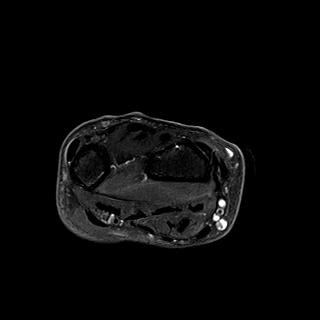
[im 29/37]
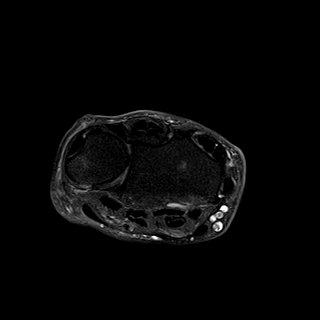
[im 33/37]
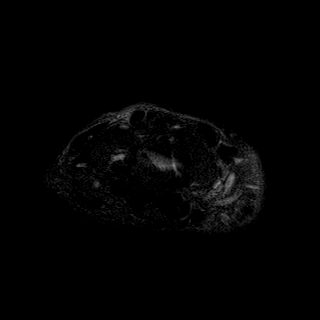
[im 37/37]
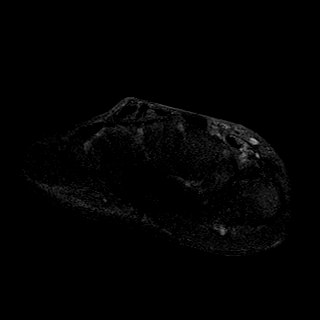

[Series 7: T1 · sagittal · left · 3.5mm · 0.31mm/px · 6 of 20 slices shown (2 of 2)]
[im 1/20]
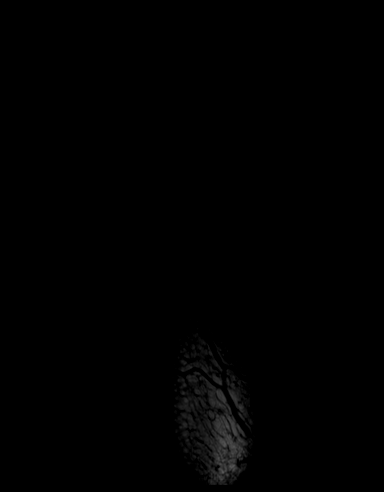
[im 4/20]
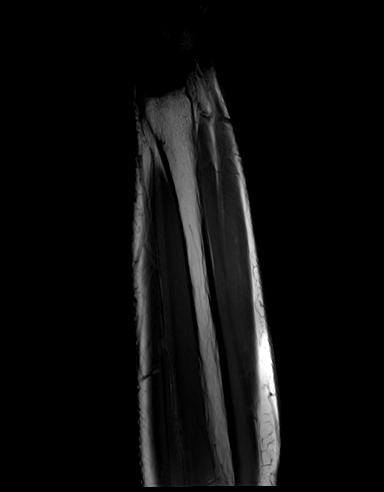
[im 8/20]
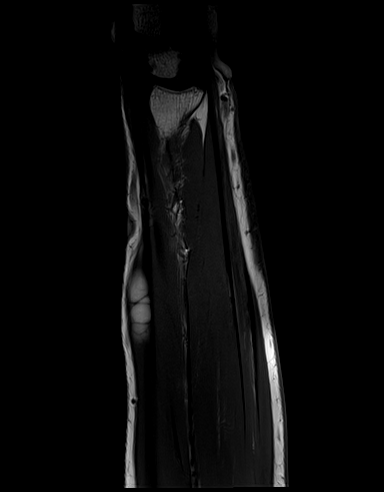
[im 12/20]
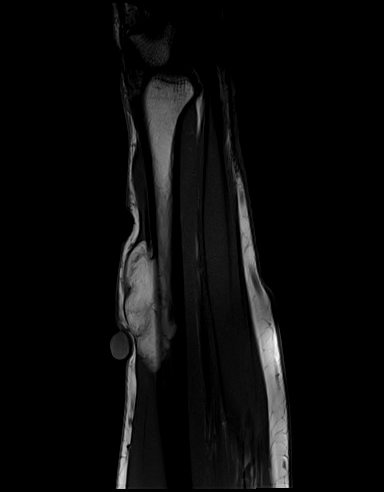
[im 16/20]
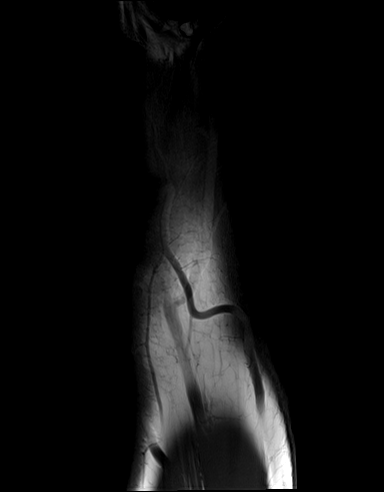
[im 20/20]
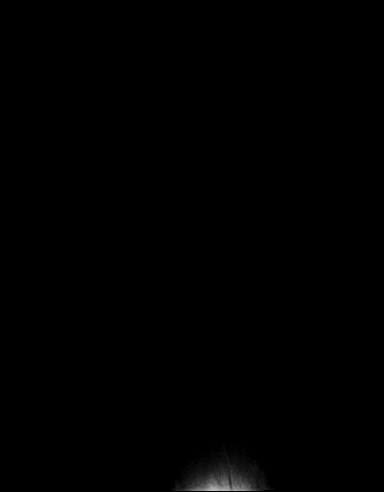

[Series 8: T2 fat-sat · coronal · left · 3.0mm · 0.41mm/px · 5 of 18 slices shown (2 of 3)]
[im 1/18]
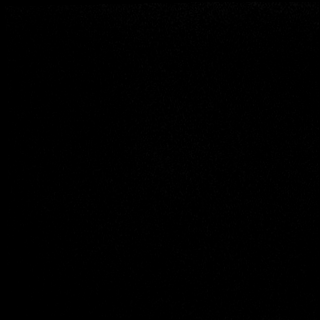
[im 5/18]
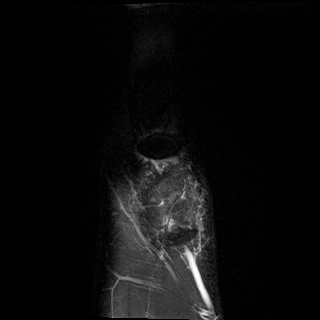
[im 9/18]
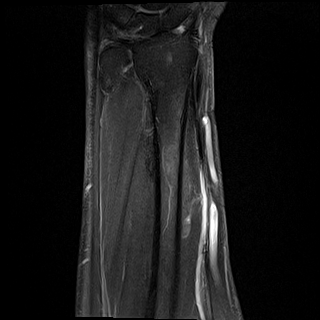
[im 13/18]
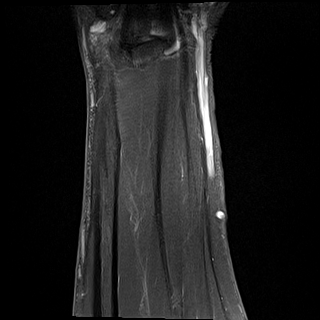
[im 18/18]
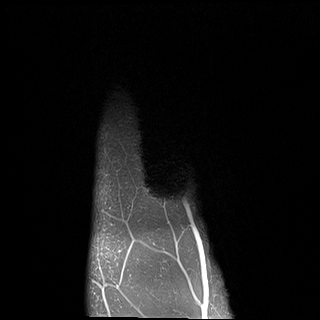

[Series 9: T2 fat-sat · sagittal · left · 3.5mm · 0.31mm/px · 6 of 20 slices shown (3 of 3)]
[im 1/20]
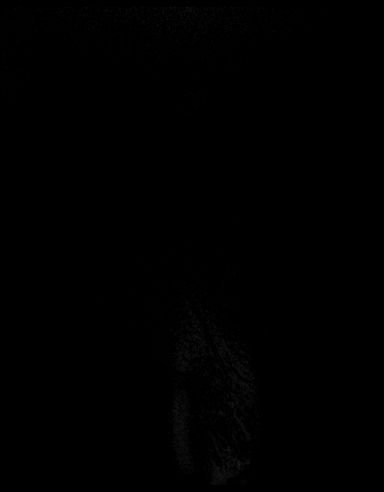
[im 4/20]
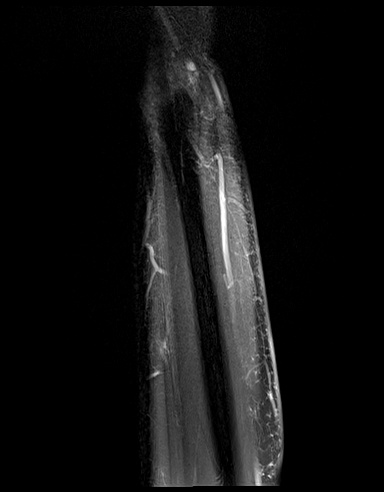
[im 8/20]
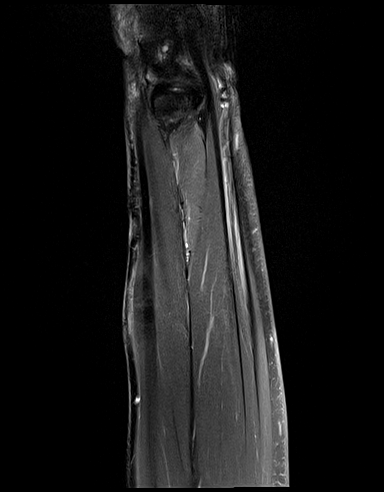
[im 12/20]
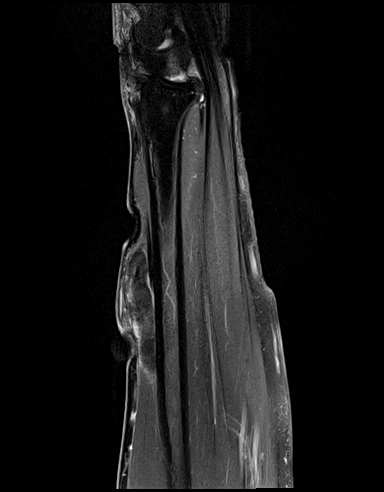
[im 16/20]
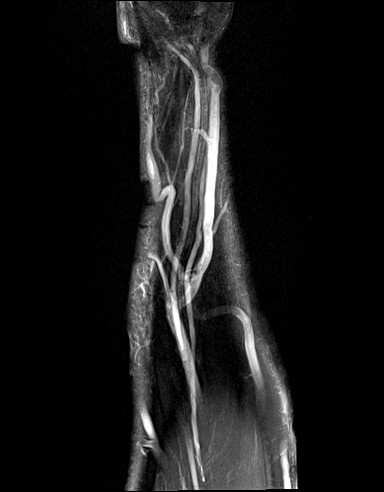
[im 20/20]
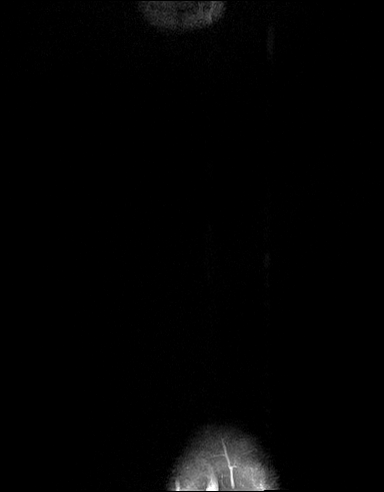

[38 of 40 positions shown; findings below may reference images not displayed]

FINDINGS: Markers are placed about the region of concern. Between the markers,
there is a mass lesion which measures 3.7 cm craniocaudal by 0.8 cm
AP by 2.3 cm transverse. The lesion is contiguous with the dorsal
cortex of the radius. It is centered over the extensor pollicis
longus muscle near the musculotendinous junction and abuts the
extensor carpi radialis brevis and longus tendons near their
musculotendinous junctions. The lesion has scattered septations
measuring up to 2 mm in thickness and extensive intermediate
increased T2 signal. No other mass is identified. Imaged muscles and
tendons are intact. Bone marrow signal is normal throughout.
IMPRESSION: Lesion in the region of concern has an appearance worrisome for an
atypical lipoma/well-differentiated liposarcoma. The appearance is
not typical of a simple lipoma. The examination is otherwise
negative.

## 2017-02-13 ENCOUNTER — Ambulatory Visit (INDEPENDENT_AMBULATORY_CARE_PROVIDER_SITE_OTHER): Payer: Medicare Other | Admitting: Family Medicine

## 2017-02-13 ENCOUNTER — Encounter: Payer: Self-pay | Admitting: Family Medicine

## 2017-02-13 VITALS — BP 122/72 | HR 66 | Temp 97.6°F | Ht 59.0 in | Wt 111.0 lb

## 2017-02-13 DIAGNOSIS — H6982 Other specified disorders of Eustachian tube, left ear: Secondary | ICD-10-CM

## 2017-02-13 DIAGNOSIS — H6980 Other specified disorders of Eustachian tube, unspecified ear: Secondary | ICD-10-CM | POA: Insufficient documentation

## 2017-02-13 MED ORDER — FLUTICASONE PROPIONATE 50 MCG/ACT NA SUSP: 2.0000 | Freq: Every day | NASAL | 2 refills | Status: DC

## 2017-02-13 NOTE — Assessment & Plan Note (Signed)
Seems to have symptoms of eustachian tube dysfunction. - Flonase provided

## 2017-02-13 NOTE — Patient Instructions (Signed)
Thank you for coming in,   Please try flonase twice a day for 7-10 days.   Please follow-up with [place] again if your symptoms don't improve.    Please feel free to call with any questions or concerns at any time, at [phone]. --Dr. Caffie Damme Tube Dysfunction The eustachian tube connects the middle ear to the back of the nose. It regulates air pressure in the middle ear by allowing air to move between the ear and nose. It also helps to drain fluid from the middle ear space. When the eustachian tube does not function properly, air pressure, fluid, or both can build up in the middle ear. Eustachian tube dysfunction can affect one or both ears. What are the causes? This condition happens when the eustachian tube becomes blocked or cannot open normally. This may result from:  Ear infections.  Colds and other upper respiratory infections.  Allergies.  Irritation, such as from cigarette smoke or acid from the stomach coming up into the esophagus (gastroesophageal reflux).  Sudden changes in air pressure, such as from descending in an airplane.  Abnormal growths in the nose or throat, such as nasal polyps, tumors, or enlarged tissue at the back of the throat (adenoids).  What increases the risk? This condition may be more likely to develop in people who smoke and people who are overweight. Eustachian tube dysfunction may also be more likely to develop in children, especially children who have:  Certain birth defects of the mouth, such as cleft palate.  Large tonsils and adenoids.  What are the signs or symptoms? Symptoms of this condition may include:  A feeling of fullness in the ear.  Ear pain.  Clicking or popping noises in the ear.  Ringing in the ear.  Hearing loss.  Loss of balance.  Symptoms may get worse when the air pressure around you changes, such as when you travel to an area of high elevation or fly on an airplane. How is this diagnosed? This  condition may be diagnosed based on:  Your symptoms.  A physical exam of your ear, nose, and throat.  Tests, such as those that measure: ? The movement of your eardrum (tympanogram). ? Your hearing (audiometry).  How is this treated? Treatment depends on the cause and severity of your condition. If your symptoms are mild, you may be able to relieve your symptoms by moving air into ("popping") your ears. If you have symptoms of fluid in your ears, treatment may include:  Decongestants.  Antihistamines.  Nasal sprays or ear drops that contain medicines that reduce swelling (steroids).  In some cases, you may need to have a procedure to drain the fluid in your eardrum (myringotomy). In this procedure, a small tube is placed in the eardrum to:  Drain the fluid.  Restore the air in the middle ear space.  Follow these instructions at home:  Take over-the-counter and prescription medicines only as told by your health care provider.  Use techniques to help pop your ears as recommended by your health care provider. These may include: ? Chewing gum. ? Yawning. ? Frequent, forceful swallowing. ? Closing your mouth, holding your nose closed, and gently blowing as if you are trying to blow air out of your nose.  Do not do any of the following until your health care provider approves: ? Travel to high altitudes. ? Fly in airplanes. ? Work in a Pension scheme manager or room. ? Scuba dive.  Keep your ears dry. Dry your  ears completely after showering or bathing.  Do not smoke.  Keep all follow-up visits as told by your health care provider. This is important. Contact a health care provider if:  Your symptoms do not go away after treatment.  Your symptoms come back after treatment.  You are unable to pop your ears.  You have: ? A fever. ? Pain in your ear. ? Pain in your head or neck. ? Fluid draining from your ear.  Your hearing suddenly changes.  You become very  dizzy.  You lose your balance. This information is not intended to replace advice given to you by your health care provider. Make sure you discuss any questions you have with your health care provider. Document Released: 05/07/2015 Document Revised: 09/16/2015 Document Reviewed: 04/29/2014 Elsevier Interactive Patient Education  [employer].

## 2017-02-13 NOTE — Progress Notes (Signed)
Alyssa Robinson - 66 y.o. female MRN [id number]  Date of birth: 05-Feb-1951  SUBJECTIVE:  Including CC & ROS.  Chief Complaint  Patient presents with  . Ear Fullness    Left more than right-denies pain    Alyssa Robinson is a 66 year old female is presenting with fullness in her left ear. She is also having some itching on the outside of her ear which seems to happen when she has an impaction of cerumen. She denies any fevers or chills. Has not had any significant ear pain. Feels like this is worse when she lies on the left side. Does not had take any medications. She was seen earlier this year and her ears washed out and that seemed to relieve her symptoms.    Review of Systems  Constitutional: Negative for fever.  HENT: Negative for ear discharge and ear pain.     HISTORY: Past Medical, Surgical, Social, and Family History Reviewed & Updated per EMR.   Pertinent Historical Findings include:  Past Medical History:  Diagnosis Date  . Anemia    hx  . Bronchitis   . Bronchitis    hx  . Diverticulosis   . GERD (gastroesophageal reflux disease)    occ  . Headache(784.0)    migraines occ  . Hypertension    no rx for 2 yrs  . Seasonal allergies     Past Surgical History:  Procedure Laterality Date  . ABDOMINAL HYSTERECTOMY     66 y/o  . APPENDECTOMY  03/17/2013   Procedure: INCIDENTAL APPENDECTOMY;  Surgeon: Gwenyth Ober, MD;  Location: [facility];  Service: General;;  . capsule endoscopy    . COLONOSCOPY     in 2012 -   . COLOSTOMY CLOSURE  03/17/2013  . COLOSTOMY REVERSAL  03/17/2013   Procedure: COLOSTOMY REVERSAL;  Surgeon: Gwenyth Ober, MD;  Location: [place];  Service: General;;  . EUS N/A 04/30/2015   Procedure: UPPER ENDOSCOPIC ULTRASOUND (EUS) LINEAR;  Surgeon: Carol Ada, MD;  Location: [facility];  Service: Endoscopy;  Laterality: N/A;  . HERNIA REPAIR     X2 2-4 y/o inguinal  . LAPAROTOMY N/A 10/17/2012   Procedure: EXPLORATORY LAPAROTOMY,  COLON RESECTION AND COLOSTOMY;   Surgeon: Gwenyth Ober, MD;  Location: [place];  Service: General;  Laterality: N/A;  . LYSIS OF ADHESION  03/17/2013   Procedure: LYSIS OF ADHESION;  Surgeon: Gwenyth Ober, MD;  Location: [place];  Service: General;;  . UPPER GASTROINTESTINAL ENDOSCOPY     2013 -    Allergies  Allergen Reactions  . Caffeine Palpitations    Heart racing  . Influenza Vaccines Nausea And Vomiting    Dizziness    Family History  Problem Relation Age of Onset  . Esophageal cancer Mother        and ovarian cancer  . Alcohol abuse Mother   . Arthritis Mother   . Heart disease Father   . Alcohol abuse Father   . Hypertension Father   . Diabetes Father   . Early death Father   . Colon cancer Other   . Pancreatic cancer Other   . Stroke Neg Hx   . Hyperlipidemia Neg Hx   . Kidney disease Neg Hx   . COPD Neg Hx      Social History   Social History  . Marital status: Married    Spouse name: N/A  . Number of children: N/A  . Years of education: N/A   Occupational History  .  Not on file.   Social History Main Topics  . Smoking status: Never Smoker  . Smokeless tobacco: Never Used     Comment: EXPOSED TO SECOND HAND SMOKE   . Alcohol use 1.2 oz/week    2 Glasses of wine per week     Comment: occasional  . Drug use: No  . Sexual activity: Not Currently   Other Topics Concern  . Not on file   Social History Narrative  . No narrative on file     PHYSICAL EXAM:  VS: BP 122/72 (BP Location: Left Arm, Patient Position: Sitting, Cuff Size: Normal)   Pulse 66   Temp 97.6 F (36.4 C) (Oral)   Ht 4\' 11"  (1.499 m)   Wt 111 lb (50.3 kg)   SpO2 97%   BMI 22.42 kg/m  Physical Exam Gen: NAD, alert, cooperative with exam, well-appearing ENT: normal lips, normal nasal mucosa, left ear shows some clear fluid but no bulging or erythema of the membrane, right tympanic membrane is normal. No cervical lymphadenopathy, Eye: normal EOM, normal conjunctiva and lids CV:  no edema, +2 pedal pulses    Resp: no accessory muscle use, non-labored,  Skin: no rashes, no areas of induration  Neuro: normal tone, normal sensation to touch Psych:  normal insight, alert and oriented MSK: Normal gait, normal strength      ASSESSMENT & PLAN:   Eustachian tube dysfunction Seems to have symptoms of eustachian tube dysfunction. - Flonase provided

## 2017-03-02 DIAGNOSIS — H40013 Open angle with borderline findings, low risk, bilateral: Secondary | ICD-10-CM | POA: Diagnosis not present

## 2017-03-05 DIAGNOSIS — D229 Melanocytic nevi, unspecified: Secondary | ICD-10-CM | POA: Diagnosis not present

## 2017-03-05 DIAGNOSIS — D485 Neoplasm of uncertain behavior of skin: Secondary | ICD-10-CM | POA: Diagnosis not present

## 2017-03-05 DIAGNOSIS — L820 Inflamed seborrheic keratosis: Secondary | ICD-10-CM | POA: Diagnosis not present

## 2017-04-13 ENCOUNTER — Other Ambulatory Visit: Payer: Self-pay | Admitting: Gastroenterology

## 2017-04-13 DIAGNOSIS — K862 Cyst of pancreas: Secondary | ICD-10-CM

## 2017-04-18 ENCOUNTER — Other Ambulatory Visit: Payer: Self-pay | Admitting: Gastroenterology

## 2017-04-18 DIAGNOSIS — K862 Cyst of pancreas: Secondary | ICD-10-CM

## 2017-05-03 ENCOUNTER — Other Ambulatory Visit: Payer: Self-pay | Admitting: Internal Medicine

## 2017-05-03 DIAGNOSIS — K862 Cyst of pancreas: Secondary | ICD-10-CM

## 2017-05-04 DIAGNOSIS — H6092 Unspecified otitis externa, left ear: Secondary | ICD-10-CM | POA: Diagnosis not present

## 2017-05-05 ENCOUNTER — Ambulatory Visit
Admission: RE | Admit: 2017-05-05 | Discharge: 2017-05-05 | Disposition: A | Discharge status: Home / Self Care | Payer: Medicare Other | Source: Ambulatory Visit | Attending: Gastroenterology | Admitting: Gastroenterology

## 2017-05-05 ENCOUNTER — Inpatient Hospital Stay
Admission: RE | Admit: 2017-05-05 | Discharge: 2017-05-05 | Disposition: A | Discharge status: Home / Self Care | Payer: Medicare Other | Source: Ambulatory Visit | Attending: Gastroenterology | Admitting: Gastroenterology

## 2017-05-05 DIAGNOSIS — K862 Cyst of pancreas: Secondary | ICD-10-CM

## 2017-05-05 MED ORDER — GADOBENATE DIMEGLUMINE 529 MG/ML IV SOLN
10.0000 mL | Freq: Once | INTRAVENOUS | Status: AC | PRN
  Administered 2017-05-05: 10 mL via INTRAVENOUS

## 2017-06-05 DIAGNOSIS — Z1231 Encounter for screening mammogram for malignant neoplasm of breast: Secondary | ICD-10-CM | POA: Diagnosis not present

## 2017-06-07 LAB — HM MAMMOGRAPHY: HM Mammogram: NORMAL (ref 0–4)

## 2017-06-11 DIAGNOSIS — H524 Presbyopia: Secondary | ICD-10-CM | POA: Diagnosis not present

## 2017-06-11 DIAGNOSIS — H5203 Hypermetropia, bilateral: Secondary | ICD-10-CM | POA: Diagnosis not present

## 2017-06-11 DIAGNOSIS — H40013 Open angle with borderline findings, low risk, bilateral: Secondary | ICD-10-CM | POA: Diagnosis not present

## 2017-06-11 DIAGNOSIS — H2513 Age-related nuclear cataract, bilateral: Secondary | ICD-10-CM | POA: Diagnosis not present

## 2017-06-11 DIAGNOSIS — H52203 Unspecified astigmatism, bilateral: Secondary | ICD-10-CM | POA: Diagnosis not present

## 2017-07-05 DIAGNOSIS — J019 Acute sinusitis, unspecified: Secondary | ICD-10-CM | POA: Diagnosis not present

## 2017-07-05 DIAGNOSIS — R05 Cough: Secondary | ICD-10-CM | POA: Diagnosis not present

## 2017-07-26 DIAGNOSIS — R159 Full incontinence of feces: Secondary | ICD-10-CM | POA: Diagnosis not present

## 2017-07-26 DIAGNOSIS — R194 Change in bowel habit: Secondary | ICD-10-CM | POA: Diagnosis not present

## 2017-07-26 DIAGNOSIS — Z800 Family history of malignant neoplasm of digestive organs: Secondary | ICD-10-CM | POA: Diagnosis not present

## 2017-09-03 DIAGNOSIS — D485 Neoplasm of uncertain behavior of skin: Secondary | ICD-10-CM | POA: Diagnosis not present

## 2017-09-03 DIAGNOSIS — D1801 Hemangioma of skin and subcutaneous tissue: Secondary | ICD-10-CM | POA: Diagnosis not present

## 2017-09-25 ENCOUNTER — Telehealth: Payer: Self-pay

## 2017-09-25 ENCOUNTER — Ambulatory Visit (INDEPENDENT_AMBULATORY_CARE_PROVIDER_SITE_OTHER): Payer: Medicare Other | Admitting: *Deleted

## 2017-09-25 ENCOUNTER — Other Ambulatory Visit (INDEPENDENT_AMBULATORY_CARE_PROVIDER_SITE_OTHER): Payer: Medicare Other

## 2017-09-25 ENCOUNTER — Encounter: Payer: Self-pay | Admitting: Internal Medicine

## 2017-09-25 ENCOUNTER — Other Ambulatory Visit: Payer: Self-pay | Admitting: Internal Medicine

## 2017-09-25 VITALS — BP 132/68 | HR 68 | Resp 18 | Ht 59.0 in | Wt 108.0 lb

## 2017-09-25 DIAGNOSIS — Z0000 Encounter for general adult medical examination without abnormal findings: Secondary | ICD-10-CM

## 2017-09-25 DIAGNOSIS — Z1239 Encounter for other screening for malignant neoplasm of breast: Secondary | ICD-10-CM

## 2017-09-25 DIAGNOSIS — R3129 Other microscopic hematuria: Secondary | ICD-10-CM

## 2017-09-25 DIAGNOSIS — I10 Essential (primary) hypertension: Secondary | ICD-10-CM | POA: Diagnosis not present

## 2017-09-25 DIAGNOSIS — E785 Hyperlipidemia, unspecified: Secondary | ICD-10-CM | POA: Diagnosis not present

## 2017-09-25 LAB — URINALYSIS, ROUTINE W REFLEX MICROSCOPIC
Bilirubin Urine: NEGATIVE
KETONES UR: NEGATIVE
LEUKOCYTES UA: NEGATIVE
Nitrite: NEGATIVE
PH: 7.5 (ref 5.0–8.0)
Specific Gravity, Urine: 1.01 (ref 1.000–1.030)
Total Protein, Urine: NEGATIVE
UROBILINOGEN UA: 0.2 (ref 0.0–1.0)
Urine Glucose: NEGATIVE
WBC UA: NONE SEEN (ref 0–?)

## 2017-09-25 LAB — CBC WITH DIFFERENTIAL/PLATELET
BASOS ABS: 0.1 10*3/uL (ref 0.0–0.1)
BASOS PCT: 1.5 % (ref 0.0–3.0)
EOS ABS: 0.1 10*3/uL (ref 0.0–0.7)
Eosinophils Relative: 2 % (ref 0.0–5.0)
HCT: 43.9 % (ref 36.0–46.0)
HEMOGLOBIN: 14.7 g/dL (ref 12.0–15.0)
LYMPHS PCT: 36.8 % (ref 12.0–46.0)
Lymphs Abs: 1.4 10*3/uL (ref 0.7–4.0)
MCHC: 33.6 g/dL (ref 30.0–36.0)
MCV: 99.2 fl (ref 78.0–100.0)
MONOS PCT: 15.5 % — AB (ref 3.0–12.0)
Monocytes Absolute: 0.6 10*3/uL (ref 0.1–1.0)
NEUTROS ABS: 1.6 10*3/uL (ref 1.4–7.7)
Neutrophils Relative %: 44.2 % (ref 43.0–77.0)
PLATELETS: 133 10*3/uL — AB (ref 150.0–400.0)
RBC: 4.42 Mil/uL (ref 3.87–5.11)
RDW: 14.4 % (ref 11.5–15.5)
WBC: 3.7 10*3/uL — AB (ref 4.0–10.5)

## 2017-09-25 LAB — COMPREHENSIVE METABOLIC PANEL
ALBUMIN: 4.4 g/dL (ref 3.5–5.2)
ALT: 18 U/L (ref 0–35)
AST: 21 U/L (ref 0–37)
Alkaline Phosphatase: 95 U/L (ref 39–117)
BUN: 14 mg/dL (ref 6–23)
CALCIUM: 9.7 mg/dL (ref 8.4–10.5)
CHLORIDE: 106 meq/L (ref 96–112)
CO2: 29 meq/L (ref 19–32)
Creatinine, Ser: 0.7 mg/dL (ref 0.40–1.20)
GFR: 88.34 mL/min (ref >60.00)
Glucose, Bld: 96 mg/dL (ref 70–99)
POTASSIUM: 5.1 meq/L (ref 3.5–5.1)
Sodium: 142 mEq/L (ref 135–145)
Total Bilirubin: 0.9 mg/dL (ref 0.2–1.2)
Total Protein: 7 g/dL (ref 6.0–8.3)

## 2017-09-25 LAB — LIPID PANEL
CHOL/HDL RATIO: 3
CHOLESTEROL: 217 mg/dL — AB (ref 0–200)
HDL: 80.7 mg/dL (ref >39.00)
LDL CALC: 124 mg/dL — AB (ref 0–99)
NonHDL: 136.41
TRIGLYCERIDES: 62 mg/dL (ref 0.0–149.0)
VLDL: 12.4 mg/dL (ref 0.0–40.0)

## 2017-09-25 LAB — TSH: TSH: 1.78 u[IU]/mL (ref 0.35–4.50)

## 2017-09-25 NOTE — Patient Instructions (Signed)
Continue doing brain stimulating activities (puzzles, reading, adult coloring books, staying active) to keep memory sharp.   Continue to eat heart healthy diet (full of fruits, vegetables, whole grains, lean protein, water--limit salt, fat, and sugar intake) and increase physical activity as tolerated.   Alyssa Robinson , Thank you for taking time to come for your Medicare Wellness Visit. I appreciate your ongoing commitment to your health goals. Please review the following plan we discussed and let me know if I can assist you in the future.   These are the goals we discussed: Goals    . Patient Stated     Stay as healthy and as independent as possible, enjoy life, family, friends, and travel.        This is a list of the screening recommended for you and due dates:  Health Maintenance  Topic Date Due  . Mammogram  05/25/2017  . Colon Cancer Screening  12/06/2023  . Tetanus Vaccine  09/13/2025  . DEXA scan (bone density measurement)  Completed  .  Hepatitis C: One time screening is recommended by Center for Disease Control  (CDC) for  adults born from [birth year] through 1966.   Completed  . Pneumonia vaccines  Completed

## 2017-09-25 NOTE — Telephone Encounter (Signed)
Pt thought that she was seeing PCP and Sharee Pimple today. Pt is only seeing Sharee Pimple and has fasted today. Can we order labs for her?

## 2017-09-25 NOTE — Telephone Encounter (Signed)
Labs ordered.

## 2017-09-25 NOTE — Progress Notes (Addendum)
Subjective:   Alyssa Robinson is a 67 y.o. female who presents for Medicare Annual (Subsequent) preventive examination.  Review of Systems:  No ROS.  Medicare Wellness Visit. Additional risk factors are reflected in the social history.  Cardiac Risk Factors include: advanced age (>83men, >42 women);dyslipidemia Sleep patterns: feels rested on waking,  gets up 1 times nightly to void and sleeps 6-7 hours nightly.    Home Safety/Smoke Alarms: Feels safe in home. Smoke alarms in place.  Living environment; residence and Firearm Safety: apartment, no firearms, Lives with husband, no needs for DME, good support system. Seat Belt Safety/Bike Helmet: Wears seat belt.     Objective:     Vitals: BP 132/68   Pulse 68   Resp 18   Ht 4\' 11"  (1.499 m)   Wt 108 lb (49 kg)   SpO2 98%   BMI 21.81 kg/m   Body mass index is 21.81 kg/m.  Advanced Directives 09/25/2017 09/19/2016 09/15/2015 04/30/2015 04/22/2015 03/17/2013 03/10/2013  Does Patient Have a Medical Advance Directive? Yes Yes Yes Yes Yes Patient has advance directive, copy in chart Patient has advance directive, copy not in chart  Type of Advance Directive [name];Living will [name];Living will [name];Living will [name];Living will Living will;Healthcare Power of Attorney - Living will;Healthcare Power of Attorney  Does patient want to make changes to medical advance directive? - - No - Patient declined - No - Patient declined No -  Copy of [place] in Chart? No - copy requested No - copy requested Yes - Yes - -    Tobacco Social History   Tobacco Use  Smoking Status Never Smoker  Smokeless Tobacco Never Used  Tobacco Comment   EXPOSED TO SECOND HAND SMOKE      Counseling given: Not Answered Comment: EXPOSED TO SECOND HAND SMOKE   Past Medical History:  Diagnosis Date  . Anemia    hx  . Bronchitis   . Bronchitis    hx    . Diverticulosis   . GERD (gastroesophageal reflux disease)    occ  . Headache(784.0)    migraines occ  . Hypertension    no rx for 2 yrs  . Seasonal allergies    Past Surgical History:  Procedure Laterality Date  . ABDOMINAL HYSTERECTOMY     67 y/o  . APPENDECTOMY  03/17/2013   Procedure: INCIDENTAL APPENDECTOMY;  Surgeon: Gwenyth Ober, MD;  Location: [facility];  Service: General;;  . capsule endoscopy    . COLONOSCOPY     in 2012 -   . COLOSTOMY CLOSURE  03/17/2013  . COLOSTOMY REVERSAL  03/17/2013   Procedure: COLOSTOMY REVERSAL;  Surgeon: Gwenyth Ober, MD;  Location: [facility];  Service: General;;  . EUS N/A 04/30/2015   Procedure: UPPER ENDOSCOPIC ULTRASOUND (EUS) LINEAR;  Surgeon: Carol Ada, MD;  [facility];  Service: Endoscopy;  Laterality: N/A;  . HERNIA REPAIR     X2 2-4 y/o inguinal  . LAPAROTOMY N/A 10/17/2012   Procedure: EXPLORATORY LAPAROTOMY,  COLON RESECTION AND COLOSTOMY;  Surgeon: Gwenyth Ober, MD;  Location: [facility];  Service: General;  Laterality: N/A;  . LYSIS OF ADHESION  03/17/2013   Procedure: LYSIS OF ADHESION;  Surgeon: Gwenyth Ober, MD;  Location: [facility];  Service: General;;  . UPPER GASTROINTESTINAL ENDOSCOPY     2013 -   Family History  Problem Relation Age of Onset  .  Esophageal cancer Mother        and ovarian cancer  . Alcohol abuse Mother   . Arthritis Mother   . Heart disease Father   . Alcohol abuse Father   . Hypertension Father   . Diabetes Father   . Early death Father   . Colon cancer Other   . Pancreatic cancer Other   . Stroke Neg Hx   . Hyperlipidemia Neg Hx   . Kidney disease Neg Hx   . COPD Neg Hx    Social History   Socioeconomic History  . Marital status: Married    Spouse name: Not on file  . Number of children: Not on file  . Years of education: Not on file  . Highest education level: Not on file  Occupational History  . Not on file  Social Needs  . Financial resource strain: Not hard at all  . Food  insecurity:    Worry: Never true    Inability: Never true  . Transportation needs:    Medical: No    Non-medical: No  Tobacco Use  . Smoking status: Never Smoker  . Smokeless tobacco: Never Used  . Tobacco comment: EXPOSED TO SECOND HAND SMOKE   Substance and Sexual Activity  . Alcohol use: Yes    Alcohol/week: 1.2 oz    Types: 2 Glasses of [name] per week    Comment: occasional  . Drug use: No  . Sexual activity: Not Currently  Lifestyle  . Physical activity:    Days per week: 7 days    Minutes per session: 50 min  . Stress: Not at all  Relationships  . Social connections:    Talks on phone: More than three times a week    Gets together: More than three times a week    Attends religious service: More than 4 times per year    Active member of club or organization: Yes    Attends meetings of clubs or organizations: More than 4 times per year    Relationship status: Married  Other Topics Concern  . Not on file  Social History Narrative  . Not on file    Outpatient Encounter Medications as of 09/25/2017  Medication Sig  . fexofenadine (ALLEGRA) 180 MG tablet Take 180 mg by mouth daily as needed for allergies.   . Probiotic Product ([name]) 170 MG CAPS Take by mouth.  . sodium chloride (OCEAN) 0.65 % SOLN nasal spray Place 1 spray into both nostrils as needed for congestion.  . [DISCONTINUED] fluticasone (FLONASE) 50 MCG/ACT nasal spray Place 2 sprays into both nostrils daily. (Patient not taking: Reported on 09/25/2017)   No facility-administered encounter medications on file as of 09/25/2017.     Activities of Daily Living In your present state of health, do you have any difficulty performing the following activities: 09/25/2017  Hearing? N  Vision? N  Difficulty concentrating or making decisions? N  Walking or climbing stairs? N  Dressing or bathing? N  Doing errands, shopping? N  Preparing Food and eating ? N  Using the Toilet? N  In the past six  months, have you accidently leaked urine? N  Do you have problems with loss of bowel control? N  Managing your Medications? N  Managing your Finances? N  Housekeeping or managing your Housekeeping? N  Some recent data might be hidden    Patient Care Team: Janith Lima, MD as PCP - General (Internal Medicine)    Assessment:  This is a routine wellness examination for [name]. Physical assessment deferred to PCP.   Exercise Activities and Dietary recommendations Current Exercise Habits: Home exercise routine, Type of exercise: walking;strength training/weights, Time (Minutes): 40, Frequency (Times/Week): 5, Weekly Exercise (Minutes/Week): 200, Intensity: Mild, Exercise limited by: None identified  Diet (meal preparation, eat out, water intake, caffeinated beverages, dairy products, fruits and vegetables): in general, a "healthy" diet  , well balanced, eats a variety of fruits and vegetables daily, limits salt, fat/cholesterol, sugar,carbohydrates,caffeine, drinks 6-8 glasses of water daily.  Goals    . Patient Stated     Stay as healthy and as independent as possible, enjoy life, family, friends, and travel.        Fall Risk Fall Risk  09/25/2017 09/19/2016 09/15/2015 11/04/2014 05/13/2014  Falls in the past year? No No No No No   Depression Screen PHQ 2/9 Scores 09/25/2017 09/19/2016 09/15/2015 11/04/2014  PHQ - 2 Score 0 0 0 0  PHQ- 9 Score - 1 - -     Cognitive Function       Ad8 score reviewed for issues:  Issues making decisions: no  Less interest in hobbies / activities: no  Repeats questions, stories (family complaining): no  Trouble using ordinary gadgets (microwave, computer, phone):no  Forgets the month or year: no  Mismanaging finances: no  Remembering appts: no  Daily problems with thinking and/or memory: no Ad8 score is= 0  Immunization History  Administered Date(s) Administered  . Pneumococcal Conjugate-13 05/13/2014  . Pneumococcal  Polysaccharide-23 09/14/2015  . Rabies, IM 12/07/2011, 12/10/2011, 12/14/2011, 12/21/2011  . Rabies, intradermal 12/07/2011  . Tdap 11/23/2005, 09/14/2015  . Zoster 11/11/2009   Screening Tests Health Maintenance  Topic Date Due  . MAMMOGRAM  05/25/2017  . COLONOSCOPY  12/06/2023  . TETANUS/TDAP  09/13/2025  . DEXA SCAN  Completed  . Hepatitis C Screening  Completed  . PNA vac Low Risk Adult  Completed      Plan:     Continue doing brain stimulating activities (puzzles, reading, adult coloring books, staying active) to keep memory sharp.   Continue to eat heart healthy diet (full of fruits, vegetables, whole grains, lean protein, water--limit salt, fat, and sugar intake) and increase physical activity as tolerated.   I have personally reviewed and noted the following in the patient's chart:   . Medical and social history . Use of alcohol, tobacco or illicit drugs  . Current medications and supplements . Functional ability and status . Nutritional status . Physical activity . Advanced directives . List of other physicians . Vitals . Screenings to include cognitive, depression, and falls . Referrals and appointments  In addition, I have reviewed and discussed with patient certain preventive protocols, quality metrics, and best practice recommendations. A written personalized care plan for preventive services as well as general preventive health recommendations were provided to patient.     Alyssa Cowboy, RN  09/25/2017   Medical screening examination/treatment/procedure(s) were performed by non-physician practitioner and as supervising physician I was immediately available for consultation/collaboration. I agree with above. Scarlette Calico, MD

## 2017-09-25 NOTE — Telephone Encounter (Signed)
Pt informed

## 2017-10-01 NOTE — Telephone Encounter (Signed)
Copied from [place] [id number]. Topic: General - Other >> Sep 27, 2017  9:27 AM Boyd Kerbs wrote: Reason for CRM:  Alyssa Robinson from [facility] called.  Alyssa Robinson was seen in [facility] and the request for Mammogram will need to be sent to them..   Phone [phone] Fax # [phone] It is hard to read the last name (please make sure it is legible)

## 2017-10-03 ENCOUNTER — Telehealth: Payer: Self-pay | Admitting: Internal Medicine

## 2017-10-03 NOTE — Telephone Encounter (Signed)
Copied from [place] [id number]. Topic: General - Other >> Sep 27, 2017  9:27 AM Boyd Kerbs wrote: Reason for CRM:  Levada Dy from [facility] called.  Carrina was seen in [facility] and the request for Mammogram will need to be sent to them..   Phone [phone] Fax # [phone] It is hard to read the last name (please make sure it is legible)   >> Oct 03, 2017  2:49 PM Cleaster Corin, NT wrote: (Caren called in reference to this referral from scheduling and pt. dob wasn't on order needing another order sent. Caren can  Be reached at [phone])  Levada Dy from [facility] called.  Laneka was seen in [facility] and the request for Mammogram will need to be sent to them..   Phone [phone] Fax # [phone] It is hard to read the last name (please make sure it is legible)

## 2017-10-03 NOTE — Telephone Encounter (Signed)
The name and DOB for pt is at the top of each page (in print). I refaxed with the same information and pointing out where the name and dob is to the fax number listed.   They can also look in Epic for the order.

## 2017-10-17 ENCOUNTER — Encounter: Payer: Self-pay | Admitting: Internal Medicine

## 2017-10-17 ENCOUNTER — Ambulatory Visit (INDEPENDENT_AMBULATORY_CARE_PROVIDER_SITE_OTHER): Payer: Medicare Other | Admitting: Internal Medicine

## 2017-10-17 VITALS — BP 130/70 | HR 62 | Temp 98.1°F | Resp 16 | Ht 59.0 in | Wt 111.2 lb

## 2017-10-17 DIAGNOSIS — D696 Thrombocytopenia, unspecified: Secondary | ICD-10-CM | POA: Diagnosis not present

## 2017-10-17 DIAGNOSIS — E785 Hyperlipidemia, unspecified: Secondary | ICD-10-CM

## 2017-10-17 DIAGNOSIS — Z0000 Encounter for general adult medical examination without abnormal findings: Secondary | ICD-10-CM

## 2017-10-17 DIAGNOSIS — I10 Essential (primary) hypertension: Secondary | ICD-10-CM

## 2017-10-17 LAB — HM MAMMOGRAPHY

## 2017-10-17 NOTE — Assessment & Plan Note (Signed)

## 2017-10-17 NOTE — Progress Notes (Signed)
Subjective:  Patient ID: Alyssa Robinson, female    DOB: Oct 28, 1950  Age: 67 y.o. MRN: [id number]  CC: Hypertension and Annual Exam   HPI Alyssa Robinson presents for a CPX.  She is controlling her blood pressure with lifestyle modifications.  She is very active and denies any recent episodes of DOE, CP, SOB, palpitations, edema, or fatigue.  She recently had lab work done and had a low platelet count.  She denies any episodes of bleeding or bruising.  Past Medical History:  Diagnosis Date  . Anemia    hx  . Bronchitis   . Bronchitis    hx  . Diverticulosis   . GERD (gastroesophageal reflux disease)    occ  . Headache(784.0)    migraines occ  . Hypertension    no rx for 2 yrs  . Seasonal allergies    Past Surgical History:  Procedure Laterality Date  . ABDOMINAL HYSTERECTOMY     67 y/o  . APPENDECTOMY  03/17/2013   Procedure: INCIDENTAL APPENDECTOMY;  Surgeon: Gwenyth Ober, MD;  Location: [facility];  Service: General;;  . capsule endoscopy    . COLONOSCOPY     in 2012 -   . COLOSTOMY CLOSURE  03/17/2013  . COLOSTOMY REVERSAL  03/17/2013   Procedure: COLOSTOMY REVERSAL;  Surgeon: Gwenyth Ober, MD;  Location: [facility];  Service: General;;  . EUS N/A 04/30/2015   Procedure: UPPER ENDOSCOPIC ULTRASOUND (EUS) LINEAR;  Surgeon: Carol Ada, MD;  [facility];  Service: Endoscopy;  Laterality: N/A;  . HERNIA REPAIR     X2 2-4 y/o inguinal  . LAPAROTOMY N/A 10/17/2012   Procedure: EXPLORATORY LAPAROTOMY,  COLON RESECTION AND COLOSTOMY;  Surgeon: Gwenyth Ober, MD;  Location: [facility];  Service: General;  Laterality: N/A;  . LYSIS OF ADHESION  03/17/2013   Procedure: LYSIS OF ADHESION;  Surgeon: Gwenyth Ober, MD;  Location: [facility];  Service: General;;  . UPPER GASTROINTESTINAL ENDOSCOPY     2013 -    reports that she has never smoked. She has never used smokeless tobacco. She reports that she drinks about 1.2 oz of alcohol per week. She reports that she does not use  drugs. family history includes Alcohol abuse in her father and mother; Arthritis in her mother; Colon cancer in her other; Diabetes in her father; Early death in her father; Esophageal cancer in her mother; Heart disease in her father; Hypertension in her father; Pancreatic cancer in her other. Allergies  Allergen Reactions  . Caffeine Palpitations    Heart racing with high doses  . Influenza Vaccines Nausea And Vomiting    Dizziness    Outpatient Medications Prior to Visit  Medication Sig Dispense Refill  . fexofenadine (ALLEGRA) 180 MG tablet Take 180 mg by mouth daily as needed for allergies.     . Probiotic Product ([place]) 170 MG CAPS Take by mouth.    . sodium chloride (OCEAN) 0.65 % SOLN nasal spray Place 1 spray into both nostrils as needed for congestion.     No facility-administered medications prior to visit.     ROS Review of Systems  Constitutional: Negative.  Negative for chills, diaphoresis, fatigue and fever.  HENT: Negative.   Eyes: Negative for visual disturbance.  Respiratory: Negative for cough, chest tightness, shortness of breath and wheezing.   Cardiovascular: Negative for chest pain, palpitations and leg swelling.  Gastrointestinal: Negative for abdominal pain, constipation, diarrhea, nausea and vomiting.  Endocrine: Negative.  Genitourinary: Negative.  Negative for difficulty urinating, dysuria, frequency, hematuria, urgency and vaginal bleeding.  Musculoskeletal: Negative.  Negative for arthralgias and myalgias.  Skin: Negative.  Negative for color change and pallor.  Allergic/Immunologic: Negative.   Neurological: Negative.  Negative for dizziness, weakness and headaches.  Hematological: Negative for adenopathy. Does not bruise/bleed easily.  Psychiatric/Behavioral: Negative.     Objective:  BP 130/70 (BP Location: Left Arm, Patient Position: Sitting, Cuff Size: Normal)   Pulse 62   Temp 98.1 F (36.7 C) (Oral)   Resp 16   Ht 4'  11" (1.499 m)   Wt 111 lb 4 oz (50.5 kg)   SpO2 96%   BMI 22.47 kg/m   BP Readings from Last 3 Encounters:  10/17/17 130/70  09/25/17 132/68  02/13/17 122/72    Wt Readings from Last 3 Encounters:  10/17/17 111 lb 4 oz (50.5 kg)  09/25/17 108 lb (49 kg)  02/13/17 111 lb (50.3 kg)    Physical Exam  Constitutional: She is oriented to person, place, and time. No distress.  HENT:  Mouth/Throat: Oropharynx is clear and moist. No oropharyngeal exudate.  Eyes: Conjunctivae are normal. No scleral icterus.  Neck: Normal range of motion. Neck supple. No JVD present. No thyromegaly present.  Cardiovascular: Normal rate, regular rhythm and normal heart sounds. Exam reveals no friction rub.  No murmur heard. Pulmonary/Chest: Effort normal and breath sounds normal. She has no wheezes. She has no rales.  Abdominal: Soft. Normal appearance and bowel sounds are normal. She exhibits no mass. There is no hepatosplenomegaly. There is no tenderness. No hernia.  Musculoskeletal: Normal range of motion. She exhibits no edema, tenderness or deformity.  Lymphadenopathy:    She has no cervical adenopathy.  Neurological: She is alert and oriented to person, place, and time.  Skin: Skin is warm and dry. She is not diaphoretic. No pallor.  Psychiatric: She has a normal mood and affect. Her behavior is normal. Judgment and thought content normal.  Vitals reviewed.   Lab Results  Component Value Date   WBC 3.7 (L) 09/25/2017   HGB 14.7 09/25/2017   HCT 43.9 09/25/2017   PLT 133.0 (L) 09/25/2017   GLUCOSE 96 09/25/2017   CHOL 217 (H) 09/25/2017   TRIG 62.0 09/25/2017   HDL 80.70 09/25/2017   LDLDIRECT 121.5 12/11/2012   LDLCALC 124 (H) 09/25/2017   ALT 18 09/25/2017   AST 21 09/25/2017   NA 142 09/25/2017   K 5.1 09/25/2017   CL 106 09/25/2017   CREATININE 0.70 09/25/2017   BUN 14 09/25/2017   CO2 29 09/25/2017   TSH 1.78 09/25/2017   INR 1.20 10/17/2012   HGBA1C 5.4 12/11/2012    Mr  Abdomen Wwo Contrast  Result Date: 05/05/2017 CLINICAL DATA:  Pancreatic cystic lesion follow up EXAM: MRI ABDOMEN WITHOUT AND WITH CONTRAST TECHNIQUE: Multiplanar multisequence MR imaging of the abdomen was performed both before and after the administration of intravenous contrast. CONTRAST:  31mL MULTIHANCE GADOBENATE DIMEGLUMINE 529 MG/ML IV SOLN Creatinine was obtained on site at [facility] at [address] Results: Creatinine 0.7 mg/dL. COMPARISON:  05/02/2016 FINDINGS: Lower chest: Pectus excavatum, Haller index 4.6. Hepatobiliary: 1.9 by 1.5 by 1.0 cm cyst noted in the right hepatic lobe, mildly reduced in size compared to the prior exam. Questionable irregularity of the extrahepatic biliary tree but much of this appearance may simply be related to small caliber and motion. No biliary dilatation is observed and accordingly I am skeptical of filling  defect in the biliary tree. The gallbladder is slightly flattened based on its location but otherwise appears normal. 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 No change in the 1.4 by 0.6 cm otherwise simple appearing cystic lesion in the uncinate process of the pancreas. No abnormal associated enhancement or interval growth. Spleen:  No significant abnormality. Adrenals/Urinary Tract: 4 mm cyst in the right mid kidney. Adrenal glands normal. Stomach/Bowel: Unremarkable Vascular/Lymphatic:  Unremarkable Other:  No supplemental non-categorized findings. Musculoskeletal: Thoracic and lumbar spondylosis and degenerative disc disease. IMPRESSION: 1. No change in the 1.4 by 0.6 cm simple appearing cystic lesion of the uncinate process of the pancreas. No associated enhancement or interval growth. Communication with the minor pancreatic duct is not entirely certain. This could be a postinflammatory cyst or a small intraductal papillary mucinous neoplasm. Based on the patient's age and current guidelines, follow-up pancreatic protocol MRI in 1 years time is  recommended. This recommendation follows ACR consensus guidelines: Management of Incidental Pancreatic Cysts: A White Paper of the ACR Incidental Findings Committee. J Am Coll Radiol 5170;01:749-449. 2. Mild reduction in size of the right hepatic lobe cyst which has benign imaging characteristics. 3. Pectus excavatum with Haller index 4.6. 4. Thoracic and lumbar spondylosis and degenerative disc disease. Electronically Signed   By: Van Clines M.D.   On: 05/05/2017 13:08    Assessment & Plan:   Alyssa Robinson was seen today for hypertension and annual exam.  Diagnoses and all orders for this visit:  Hyperlipidemia with target LDL less than 130- She has a low ASCVD risk score so I do not recommend a statin for CV risk reduction.  Routine general medical examination at a health care facility  Essential hypertension, benign- Her blood pressure is well controlled.  Recent labs were negative for secondary causes or endorgan damage.  Thrombocytopenia ([place])- She has a slightly low platelet count 133.  She has no history of excessive bleeding or bruising.  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  I will continue to monitor this in future visits.   I am having Alyssa Robinson maintain her fexofenadine, ULTRAFLORA IMMUNE HE[facility], and sodium chloride.  No orders of the defined types were placed in this encounter.  See AVS for instructions about healthy living and anticipatory guidance.  Follow-up: No follow-ups on file.  Alyssa Calico, MD

## 2017-10-31 ENCOUNTER — Encounter: Payer: Self-pay | Admitting: Internal Medicine

## 2017-11-21 ENCOUNTER — Other Ambulatory Visit: Payer: Self-pay

## 2018-01-22 ENCOUNTER — Ambulatory Visit: Payer: Medicare Other

## 2018-01-29 NOTE — Progress Notes (Signed)
Subjective:    Patient ID: RAELEE Robinson, female    DOB: Jul 08, 1950, 67 y.o.   MRN: [id number]  HPI The patient is here for an acute visit.   Left ear stopped up: She is here today because her left ear feels "stopped up".  This occurs intermittently.  She denies any ear pain or true pressure clogged feeling.  She denies any hearing loss.  She tends to get a lot of dryness in the outer edge of the ear and sometimes this can accumulate inside the ear.  She does scratch it.  She denies other cold symptoms, fevers or chills.  She was unsure if the ear needed to be cleaned out.    Medications and allergies reviewed with patient and updated if appropriate.  Patient Active Problem List   Diagnosis Date Noted  . Thrombocytopenia ([place]) 10/17/2017  . Hyperlipidemia with target LDL less than 130 09/14/2015  . Vaginal atrophy 06/01/2015  . 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 Routine general medical examination at a health care facility 12/11/2012  . Essential hypertension, benign 12/11/2012  . Constipation 10/17/2012    Current Outpatient Medications on File Prior to Visit  Medication Sig Dispense Refill  . fexofenadine (ALLEGRA) 180 MG tablet Take 180 mg by mouth daily as needed for allergies.     . Probiotic Product ([place]) 170 MG CAPS Take by mouth.    . sodium chloride (OCEAN) 0.65 % SOLN nasal spray Place 1 spray into both nostrils as needed for congestion.     No current facility-administered medications on file prior to visit.     Past Medical History:  Diagnosis Date  . Anemia    hx  . Bronchitis   . Bronchitis    hx  . Diverticulosis   . GERD (gastroesophageal reflux disease)    occ  . Headache(784.0)    migraines occ  . Hypertension    no rx for 2 yrs  . Seasonal allergies     Past Surgical History:  Procedure Laterality Date  . ABDOMINAL HYSTERECTOMY     67 y/o  . APPENDECTOMY  03/17/2013   Procedure: INCIDENTAL APPENDECTOMY;  Surgeon: Gwenyth Ober, MD;  Location: [place];  Service: General;;  . capsule endoscopy    . COLONOSCOPY     in 2012 -   . COLOSTOMY CLOSURE  03/17/2013  . COLOSTOMY REVERSAL  03/17/2013   Procedure: COLOSTOMY REVERSAL;  Surgeon: Gwenyth Ober, MD;  Location: [place];  Service: General;;  . EUS N/A 04/30/2015   Procedure: UPPER ENDOSCOPIC ULTRASOUND (EUS) LINEAR;  Surgeon: Carol Ada, MD;  Location: [facility];  Service: Endoscopy;  Laterality: N/A;  . HERNIA REPAIR     X2 2-4 y/o inguinal  . LAPAROTOMY N/A 10/17/2012   Procedure: EXPLORATORY LAPAROTOMY,  COLON RESECTION AND COLOSTOMY;  Surgeon: Gwenyth Ober, MD;  Location: [place];  Service: General;  Laterality: N/A;  . LYSIS OF ADHESION  03/17/2013   Procedure: LYSIS OF ADHESION;  Surgeon: Gwenyth Ober, MD;  Location: [place];  Service: General;;  . UPPER GASTROINTESTINAL ENDOSCOPY     2013 -    Social History   Socioeconomic History  . Marital status: Married    Spouse name: Not on file  . Number of children: Not on file  . Years of education: Not on file  . Highest education level: Not on file  Occupational History  . Not on file  Social Needs  .  Financial resource strain: Not hard at all  . Food insecurity:    Worry: Never true    Inability: Never true  . Transportation needs:    Medical: No    Non-medical: No  Tobacco Use  . Smoking status: Never Smoker  . Smokeless tobacco: Never Used  . Tobacco comment: EXPOSED TO SECOND HAND SMOKE   Substance and Sexual Activity  . Alcohol use: Yes    Alcohol/week: 2.0 standard drinks    Types: 2 Glasses of wine per week    Comment: occasional  . Drug use: No  . Sexual activity: Not Currently  Lifestyle  . Physical activity:    Days per week: 7 days    Minutes per session: 50 min  . Stress: Not at all  Relationships  . Social connections:    Talks on phone: More than three times a week    Gets together: More than three times a week    Attends religious service: More than 4 times per year     Active member of club or organization: Yes    Attends meetings of clubs or organizations: More than 4 times per year    Relationship status: Married  Other Topics Concern  . Not on file  Social History Narrative  . Not on file    Family History  Problem Relation Age of Onset  . Esophageal cancer Mother        and ovarian cancer  . Alcohol abuse Mother   . Arthritis Mother   . Heart disease Father   . Alcohol abuse Father   . Hypertension Father   . Diabetes Father   . Early death Father   . Colon cancer Other   . Pancreatic cancer Other   . Stroke Neg Hx   . Hyperlipidemia Neg Hx   . Kidney disease Neg Hx   . COPD Neg Hx     Review of Systems  Constitutional: Negative for chills and fever.  HENT: Negative for congestion, ear pain (rare pain), hearing loss, sinus pain and sore throat.        No ear pressure  Respiratory: Negative for cough, shortness of breath and wheezing.   Neurological: Negative for light-headedness and headaches.       Objective:   Vitals:   01/30/18 1455  BP: (!) 170/88  Pulse: 64  Resp: 16  Temp: 97.9 F (36.6 C)  SpO2: 97%   BP Readings from Last 3 Encounters:  01/30/18 (!) 170/88  10/17/17 130/70  09/25/17 132/68   Wt Readings from Last 3 Encounters:  01/30/18 112 lb 12.8 oz (51.2 kg)  10/17/17 111 lb 4 oz (50.5 kg)  09/25/17 108 lb (49 kg)   Body mass index is 22.78 kg/m.   Physical Exam  Constitutional: She appears well-developed and well-nourished. No distress.  HENT:  Head: Normocephalic and atraumatic.  Right Ear: External ear normal.  Mouth/Throat: Oropharynx is clear and moist.  Right ear canal and tympanic membrane normal.  Left external ear with mild skin dryness, no dryness in the ear canal ear canal without dryness, erythema or discharge, tympanic membrane normal  Neck: No tracheal deviation present. No thyromegaly present.  Lymphadenopathy:    She has no cervical adenopathy.  Skin: She is not diaphoretic.           Assessment & Plan:    See Problem List for Assessment and Plan of chronic medical problems.

## 2018-01-30 ENCOUNTER — Ambulatory Visit: Payer: Medicare Other

## 2018-01-30 ENCOUNTER — Encounter: Payer: Self-pay | Admitting: Internal Medicine

## 2018-01-30 ENCOUNTER — Ambulatory Visit (INDEPENDENT_AMBULATORY_CARE_PROVIDER_SITE_OTHER): Payer: Medicare Other | Admitting: Internal Medicine

## 2018-01-30 VITALS — BP 170/88 | HR 64 | Temp 97.9°F | Resp 16 | Ht 59.0 in | Wt 112.8 lb

## 2018-01-30 DIAGNOSIS — L309 Dermatitis, unspecified: Secondary | ICD-10-CM | POA: Diagnosis not present

## 2018-01-30 DIAGNOSIS — Z23 Encounter for immunization: Secondary | ICD-10-CM

## 2018-01-30 NOTE — Assessment & Plan Note (Signed)
Left external ear only, mild in nature Ear canal looks normal Advised she can try cortisone 10, Vaseline/Aquaphor or her moisturizing lotion that she uses for her dry skin.  No need to put anything into the canal Call if no improvement

## 2018-01-30 NOTE — Patient Instructions (Signed)
You can try cortisone cream, vaseline or creams on the outside of the ear.    Flu immunization administered today.

## 2018-05-07 ENCOUNTER — Other Ambulatory Visit: Payer: Self-pay | Admitting: Gastroenterology

## 2018-05-08 DIAGNOSIS — J321 Chronic frontal sinusitis: Secondary | ICD-10-CM | POA: Diagnosis not present

## 2018-05-11 DIAGNOSIS — J069 Acute upper respiratory infection, unspecified: Secondary | ICD-10-CM | POA: Diagnosis not present

## 2018-05-11 DIAGNOSIS — B9789 Other viral agents as the cause of diseases classified elsewhere: Secondary | ICD-10-CM | POA: Diagnosis not present

## 2018-05-13 ENCOUNTER — Ambulatory Visit (INDEPENDENT_AMBULATORY_CARE_PROVIDER_SITE_OTHER): Payer: Medicare Other | Admitting: Family

## 2018-05-13 ENCOUNTER — Encounter: Payer: Self-pay | Admitting: Family

## 2018-05-13 VITALS — BP 124/90 | HR 67 | Temp 97.8°F | Ht 59.0 in | Wt 113.0 lb

## 2018-05-13 DIAGNOSIS — K120 Recurrent oral aphthae: Secondary | ICD-10-CM

## 2018-05-13 MED ORDER — MAGIC MOUTHWASH: 5.0000 mL | Freq: Three times a day (TID) | ORAL | 0 refills | Status: DC | PRN

## 2018-05-13 NOTE — Patient Instructions (Signed)
Canker Sores  Canker sores are small, painful sores that develop inside your mouth. You can get one or more canker sores on the inside of your lips or cheeks, on your tongue, or anywhere inside your mouth. Canker sores cannot be passed from person to person (are not contagious). These sores are different from the sores that you may get on the outside of your lips (cold sores or fever blisters). What are the causes? The cause of this condition is not known. The condition may be passed down from a parent (genetic). What increases the risk? This condition is more likely to develop in:  Women.  People in their teens or 20s.  Women who are having their menstrual period.  People who are under a lot of emotional stress.  People who do not get enough iron or B vitamins.  People who do not take care of their mouth and teeth (have poor oral hygiene).  People who have an injury inside the mouth, such as after having dental work or from chewing something hard. What are the signs or symptoms? Canker sores usually start as painful red bumps. Then they turn into small white, yellow, or gray sores that have red borders. The sores may be painful, and the pain may get worse when you eat or drink. Along with the canker sore, symptoms may also include:  Fever.  Fatigue.  Swollen lymph nodes in your neck. How is this diagnosed? This condition may be diagnosed based on your symptoms and an exam of the inside of your mouth. If you get canker sores often or if they are very bad, you may have tests, such as:  Blood tests to rule out possible causes.  Swabbing a fluid sample from the sore to be tested for infection.  Removing a small tissue sample from the sore (biopsy) to test it for cancer. How is this treated? Most canker sores go away without treatment in about 1 week. Home care is usually the only treatment that you will need. Over-the-counter medicines can relieve discomfort. If you have severe  canker sores, your health care provider may prescribe:  Numbing ointment to relieve pain. ? Do not use numbing gels or products containing benzocaine in children who are 2 years of age or younger.  Vitamins.  Steroid medicines. These may be given as pills, mouth rinses, or gels.  Antibiotic mouth rinse. Follow these instructions at home:   Apply, take, or use over-the-counter and prescription medicines only as told by your health care provider. These include vitamins and ointments.  If you were prescribed an antibiotic mouth rinse, use it as told by your health care provider. Do not stop using the antibiotic even if your condition improves.  Until the sores are healed: ? Do not drink coffee or citrus juices. ? Do not eat spicy or salty foods.  Use a mild, over-the-counter mouth rinse as recommended by your health care provider.  Practice good oral hygiene by: ? Flossing your teeth every day. ? Brushing your teeth with a soft toothbrush twice each day. Contact a health care provider if:  Your symptoms do not get better after 2 weeks.  You also have a fever or swollen glands in your neck.  You get canker sores often.  You have a canker sore that is getting larger.  You cannot eat or drink due to your canker sores. Summary  Canker sores are small, painful sores that develop inside your mouth.  Canker sores usually start as painful   red bumps that turn into small white, yellow, or gray sores that have red borders.  The sores may be quite painful, and the pain may get worse when you eat or drink.  Most canker sores clear up without treatment in about 1 week. Home care is usually the only treatment that you will need. Over-the-counter medicines can relieve discomfort. This information is not intended to replace advice given to you by your health care provider. Make sure you discuss any questions you have with your health care provider. Document Released: 08/05/2010 Document  Revised: 01/15/2017 Document Reviewed: 01/15/2017 Elsevier Interactive Patient Education  2019 Elsevier Inc.  

## 2018-05-13 NOTE — Progress Notes (Signed)
Alyssa Robinson is a 68 y.o. female with the following history as recorded in Epic[employer]:  Patient Active Problem List   Diagnosis Date Noted  . Dermatitis of external ear 01/30/2018  . Thrombocytopenia ([place]) 10/17/2017  . Hyperlipidemia with target LDL less than 130 09/14/2015  . Vaginal atrophy 06/01/2015  . 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 Routine general medical examination at a health care facility 12/11/2012  . Essential hypertension, benign 12/11/2012  . Constipation 10/17/2012    Current Outpatient Medications  Medication Sig Dispense Refill  . benzonatate (TESSALON) 100 MG capsule Take by mouth.    . cefdinir (OMNICEF) 300 MG capsule     . fexofenadine (ALLEGRA) 180 MG tablet Take 180 mg by mouth daily as needed for allergies.     . Pediatric Multiple Vit-C-FA (PEDIATRIC MULTIVITAMIN) chewable tablet Chew by mouth.    . Probiotic Product ([place]) 170 MG CAPS Take by mouth.    . sodium chloride (OCEAN) 0.65 % SOLN nasal spray Place 1 spray into both nostrils as needed for congestion.    Marland Kitchen guaiFENesin (MUCINEX) 600 MG 12 hr tablet Take by mouth.    . magic mouthwash SOLN Take 5 mLs by mouth 3 (three) times daily as needed for mouth pain. Gargle and spit 120 mL 0   No current facility-administered medications for this visit.     Allergies: Caffeine and Influenza vaccines  Past Medical History:  Diagnosis Date  . Anemia    hx  . Bronchitis   . Bronchitis    hx  . Diverticulosis   . GERD (gastroesophageal reflux disease)    occ  . Headache(784.0)    migraines occ  . Hypertension    no rx for 2 yrs  . Seasonal allergies     Past Surgical History:  Procedure Laterality Date  . ABDOMINAL HYSTERECTOMY     68 y/o  . APPENDECTOMY  03/17/2013   Procedure: INCIDENTAL APPENDECTOMY;  Surgeon: Gwenyth Ober, MD;  Location: [place];  Service: General;;  . capsule endoscopy    . COLONOSCOPY     in 2012 -   . COLOSTOMY CLOSURE  03/17/2013  . COLOSTOMY REVERSAL   03/17/2013   Procedure: COLOSTOMY REVERSAL;  Surgeon: Gwenyth Ober, MD;  Location: [place];  Service: General;;  . EUS N/A 04/30/2015   Procedure: UPPER ENDOSCOPIC ULTRASOUND (EUS) LINEAR;  Surgeon: Carol Ada, MD;  [facility];  Service: Endoscopy;  Laterality: N/A;  . HERNIA REPAIR     X2 2-4 y/o inguinal  . LAPAROTOMY N/A 10/17/2012   Procedure: EXPLORATORY LAPAROTOMY,  COLON RESECTION AND COLOSTOMY;  Surgeon: Gwenyth Ober, MD;  Location: [place];  Service: General;  Laterality: N/A;  . LYSIS OF ADHESION  03/17/2013   Procedure: LYSIS OF ADHESION;  Surgeon: Gwenyth Ober, MD;  Location: [place];  Service: General;;  . UPPER GASTROINTESTINAL ENDOSCOPY     2013 -    Family History  Problem Relation Age of Onset  . Esophageal cancer Mother        and ovarian cancer  . Alcohol abuse Mother   . Arthritis Mother   . Heart disease Father   . Alcohol abuse Father   . Hypertension Father   . Diabetes Father   . Early death Father   . Colon cancer Other   . Pancreatic cancer Other   . Stroke Neg Hx   . Hyperlipidemia Neg Hx   . Kidney disease Neg Hx   .  COPD Neg Hx     Social History   Tobacco Use  . Smoking status: Never Smoker  . Smokeless tobacco: Never Used  . Tobacco comment: EXPOSED TO SECOND HAND SMOKE   Substance Use Topics  . Alcohol use: Yes    Alcohol/week: 2.0 standard drinks    Types: 2 Glasses of wine per week    Comment: occasional    Subjective:  Patient is concerned about "sores" in mouth; was treated for sinus infection last week- has been given Omnicef; went back to [facility] 3 days later with concern for persisting symptoms- was told that she had aphthous ulcers; has been using different toothpaste in the past few weeks- just wanted to be sure that not having allergic reaction to the new toothpaste.     Objective:  Vitals:   05/13/18 1406  BP: 124/90  Pulse: 67  Temp: 97.8 F (36.6 C)  TempSrc: Oral  SpO2: 95%  Weight: 113 lb (51.3 kg)  Height:  4\' 11"  (1.499 m)    General: Well developed, well nourished, in no acute distress  Skin : Warm and dry.  Head: Normocephalic and atraumatic  Eyes: Sclera and conjunctiva clear; pupils round and reactive to light; extraocular movements intact  Ears: External normal; canals clear; tympanic membranes normal  Oropharynx: Pink, supple. Aphthous ulcers noted on inner left side of mouth at last 2 teeth  Neck: Supple without thyromegaly, adenopathy  Lungs: Respirations unlabored;  Neurologic: Alert and oriented; speech intact; face symmetrical; moves all extremities well; CNII-XII intact without focal deficit   Assessment:  1. Aphthous ulcer     Plan:  Reassurance; suspect related to original viral illness as opposed to reaction to toothpaste. Rx for Magic Mouthwash- use as directed; follow-up worse, no better.   No follow-ups on file.  No orders of the defined types were placed in this encounter.   Requested Prescriptions   Signed Prescriptions Disp Refills  . magic mouthwash SOLN 120 mL 0    Sig: Take 5 mLs by mouth 3 (three) times daily as needed for mouth pain. Gargle and spit

## 2018-05-18 ENCOUNTER — Ambulatory Visit
Admission: RE | Admit: 2018-05-18 | Discharge: 2018-05-18 | Disposition: A | Discharge status: Home / Self Care | Payer: Medicare Other | Source: Ambulatory Visit | Attending: Gastroenterology | Admitting: Gastroenterology

## 2018-05-18 DIAGNOSIS — K869 Disease of pancreas, unspecified: Secondary | ICD-10-CM | POA: Diagnosis not present

## 2018-05-18 MED ORDER — GADOBENATE DIMEGLUMINE 529 MG/ML IV SOLN
10.0000 mL | Freq: Once | INTRAVENOUS | Status: AC | PRN
  Administered 2018-05-18: 10 mL via INTRAVENOUS

## 2018-06-07 DIAGNOSIS — Z1231 Encounter for screening mammogram for malignant neoplasm of breast: Secondary | ICD-10-CM | POA: Diagnosis not present

## 2018-06-07 LAB — HM MAMMOGRAPHY

## 2018-06-09 ENCOUNTER — Emergency Department (HOSPITAL_COMMUNITY): Payer: Medicare Other

## 2018-06-09 ENCOUNTER — Emergency Department (HOSPITAL_COMMUNITY)
Admission: EM | Admit: 2018-06-09 | Discharge: 2018-06-09 | Disposition: A | Discharge status: Home / Self Care | Payer: Medicare Other | Attending: Emergency Medicine | Admitting: Emergency Medicine

## 2018-06-09 ENCOUNTER — Encounter (HOSPITAL_COMMUNITY): Payer: Self-pay | Admitting: Emergency Medicine

## 2018-06-09 ENCOUNTER — Other Ambulatory Visit: Payer: Self-pay

## 2018-06-09 DIAGNOSIS — Y999 Unspecified external cause status: Secondary | ICD-10-CM | POA: Diagnosis not present

## 2018-06-09 DIAGNOSIS — Y929 Unspecified place or not applicable: Secondary | ICD-10-CM | POA: Diagnosis not present

## 2018-06-09 DIAGNOSIS — Z79899 Other long term (current) drug therapy: Secondary | ICD-10-CM | POA: Insufficient documentation

## 2018-06-09 DIAGNOSIS — Y939 Activity, unspecified: Secondary | ICD-10-CM | POA: Diagnosis not present

## 2018-06-09 DIAGNOSIS — S60222A Contusion of left hand, initial encounter: Secondary | ICD-10-CM | POA: Insufficient documentation

## 2018-06-09 DIAGNOSIS — W11XXXA Fall on and from ladder, initial encounter: Secondary | ICD-10-CM | POA: Insufficient documentation

## 2018-06-09 DIAGNOSIS — I10 Essential (primary) hypertension: Secondary | ICD-10-CM | POA: Insufficient documentation

## 2018-06-09 DIAGNOSIS — S6992XA Unspecified injury of left wrist, hand and finger(s), initial encounter: Secondary | ICD-10-CM | POA: Diagnosis not present

## 2018-06-09 DIAGNOSIS — M7989 Other specified soft tissue disorders: Secondary | ICD-10-CM | POA: Diagnosis not present

## 2018-06-09 NOTE — Discharge Instructions (Addendum)
Return if any problems.  See the Orthopaedist for recheck in 1 week

## 2018-06-09 NOTE — ED Triage Notes (Signed)
Patient was on a ladder and missed last step. Patient fell on left hand. She is having pain in left hand and it is swollen.

## 2018-06-10 DIAGNOSIS — H2513 Age-related nuclear cataract, bilateral: Secondary | ICD-10-CM | POA: Diagnosis not present

## 2018-06-10 DIAGNOSIS — H40013 Open angle with borderline findings, low risk, bilateral: Secondary | ICD-10-CM | POA: Diagnosis not present

## 2018-06-10 NOTE — ED Provider Notes (Signed)
[facility] DEPT Provider Note   CSN: [id number] Arrival date & time: 06/09/18  1859    History   Chief Complaint Chief Complaint  Patient presents with  . Hand Injury    HPI Alyssa Robinson is a 68 y.o. female.     The history is provided by the patient. No language interpreter was used.  Hand Injury  Location:  Hand Injury: yes   Mechanism of injury: fall   Fall:    Fall occurred:  From a Engineer, civil (consulting) of impact:  Hands Pain details:    Quality:  Aching   Severity:  Moderate   Onset quality:  Gradual Dislocation: no   Relieved by:  Nothing Worsened by:  Nothing Pt reports she fell and hit hand  Past Medical History:  Diagnosis Date  . Anemia    hx  . Bronchitis   . Bronchitis    hx  . Diverticulosis   . GERD (gastroesophageal reflux disease)    occ  . Headache(784.0)    migraines occ  . Hypertension    no rx for 2 yrs  . Seasonal allergies     Patient Active Problem List   Diagnosis Date Noted  . Dermatitis of external ear 01/30/2018  . Thrombocytopenia ([place]) 10/17/2017  . Hyperlipidemia with target LDL less than 130 09/14/2015  . Vaginal atrophy 06/01/2015  . 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 Routine general medical examination at a health care facility 12/11/2012  . Essential hypertension, benign 12/11/2012  . Constipation 10/17/2012    Past Surgical History:  Procedure Laterality Date  . ABDOMINAL HYSTERECTOMY     68 y/o  . APPENDECTOMY  03/17/2013   Procedure: INCIDENTAL APPENDECTOMY;  Surgeon: Gwenyth Ober, MD;  Location: [place] [place];  Service: General;;  . capsule endoscopy    . COLONOSCOPY     in 2012 -   . COLOSTOMY CLOSURE  03/17/2013  . COLOSTOMY REVERSAL  03/17/2013   Procedure: COLOSTOMY REVERSAL;  Surgeon: Gwenyth Ober, MD;  Location: [place];  Service: General;;  . EUS N/A 04/30/2015   Procedure: UPPER ENDOSCOPIC ULTRASOUND (EUS) LINEAR;  Surgeon: Carol Ada, MD;  [facility];  Service:  Endoscopy;  Laterality: N/A;  . HERNIA REPAIR     X2 2-4 y/o inguinal  . LAPAROTOMY N/A 10/17/2012   Procedure: EXPLORATORY LAPAROTOMY,  COLON RESECTION AND COLOSTOMY;  Surgeon: Gwenyth Ober, MD;  Location: [place];  Service: General;  Laterality: N/A;  . LYSIS OF ADHESION  03/17/2013   Procedure: LYSIS OF ADHESION;  Surgeon: Gwenyth Ober, MD;  Location: [place];  Service: General;;  . UPPER GASTROINTESTINAL ENDOSCOPY     2013 -     OB History   No obstetric history on file.      Home Medications    Prior to Admission medications   Medication Sig Start Date End Date Taking? Authorizing Provider  cefdinir (OMNICEF) 300 MG capsule  05/08/18   [provider]  fexofenadine (ALLEGRA) 180 MG tablet Take 180 mg by mouth daily as needed for allergies.     [provider]  magic mouthwash SOLN Take 5 mLs by mouth 3 (three) times daily as needed for mouth pain. Gargle and spit 05/13/18   Marrian Salvage, FNP  Pediatric Multiple Vit-C-FA (PEDIATRIC MULTIVITAMIN) chewable tablet Chew by mouth.    [provider]  Probiotic Product ([place]) 170 MG CAPS Take by mouth.    [provider]  sodium chloride (OCEAN) 0.65 % SOLN nasal spray Place 1 spray into both nostrils as needed for congestion.    [provider]    Family History Family History  Problem Relation Age of Onset  . Esophageal cancer Mother        and ovarian cancer  . Alcohol abuse Mother   . Arthritis Mother   . Heart disease Father   . Alcohol abuse Father   . Hypertension Father   . Diabetes Father   . Early death Father   . Colon cancer Other   . Pancreatic cancer Other   . Stroke Neg Hx   . Hyperlipidemia Neg Hx   . Kidney disease Neg Hx   . COPD Neg Hx     Social History Social History   Tobacco Use  . Smoking status: Never Smoker  . Smokeless tobacco: Never Used  . Tobacco comment: EXPOSED TO SECOND HAND SMOKE   Substance Use Topics  .  Alcohol use: Yes    Alcohol/week: 2.0 standard drinks    Types: 2 Glasses of wine per week    Comment: occasional  . Drug use: No     Allergies   Sodium lauryl sulfate   Review of Systems Review of Systems  All other systems reviewed and are negative.    Physical Exam Updated Vital Signs BP (!) 161/84 (BP Location: Left Arm)   Pulse 77   Temp 98.9 F (37.2 C) (Oral)   Resp 16   Ht 4\' 11"  (1.499 m)   Wt 49.9 kg   SpO2 99%   BMI 22.22 kg/m   Physical Exam Vitals signs and nursing note reviewed.  HENT:     Head: Normocephalic.  Neck:     Musculoskeletal: Normal range of motion.  Musculoskeletal: Normal range of motion.        General: Swelling and tenderness present.  Skin:    General: Skin is warm.  Neurological:     General: No focal deficit present.     Mental Status: She is alert.  Psychiatric:        Mood and Affect: Mood normal.      ED Treatments / Results  Labs (all labs ordered are listed, but only abnormal results are displayed) Labs Reviewed - No data to display  EKG None  Radiology Dg Hand Complete Left  Result Date: 06/09/2018 CLINICAL DATA:  68 year old female status post fall from ladder onto left hand. EXAM: LEFT HAND - COMPLETE 3+ VIEW COMPARISON:  None. FINDINGS: Bone mineralization is within normal limits for age. Distal radius and ulna appear intact. Carpal bone alignment preserved. 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 No acute fracture in the region. Other joint spaces in the left hand are normal for age. No metacarpal or phalanx fracture identified. There is soft tissue swelling at the wrist IMPRESSION: 1. Soft tissue swelling at the wrist. No acute fracture or dislocation identified. 2. 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 Electronically Signed   By: Genevie Ann M.D.   On: 06/09/2018 20:05    Procedures Procedures (including critical care time)  Medications Ordered in  ED Medications - No data to display   Initial Impression / Assessment and Plan / ED Course  I have reviewed the triage vital signs and the nursing notes.  Pertinent labs & imaging results that were available during my care of the patient were reviewed by me and considered in my  medical decision making (see chart for details).        MDM  Xrays reviewed and discussed with pt.  Pt advised to take follow up.    Final Clinical Impressions(s) / ED Diagnoses   Final diagnoses:  Contusion of left hand, initial encounter    ED Discharge Orders    None    An After Visit Summary was printed and given to the patient.    Sidney Ace 06/10/18 1808    Hayden Rasmussen, MD 06/11/18 [phone]

## 2018-06-13 ENCOUNTER — Encounter: Payer: Self-pay | Admitting: Internal Medicine

## 2018-07-08 ENCOUNTER — Ambulatory Visit (INDEPENDENT_AMBULATORY_CARE_PROVIDER_SITE_OTHER): Payer: Medicare Other | Admitting: Internal Medicine

## 2018-07-08 ENCOUNTER — Other Ambulatory Visit: Payer: Self-pay

## 2018-07-08 ENCOUNTER — Encounter: Payer: Self-pay | Admitting: Internal Medicine

## 2018-07-08 VITALS — BP 170/92 | HR 78 | Temp 98.4°F | Resp 16 | Ht 59.0 in | Wt 113.0 lb

## 2018-07-08 DIAGNOSIS — J0100 Acute maxillary sinusitis, unspecified: Secondary | ICD-10-CM | POA: Insufficient documentation

## 2018-07-08 MED ORDER — AMOXICILLIN 500 MG PO CAPS: 500.0000 mg | ORAL_CAPSULE | Freq: Three times a day (TID) | ORAL | 0 refills | Status: DC

## 2018-07-08 NOTE — Patient Instructions (Addendum)
° ° °  Take the antibiotic as prescribed - complete the entire course.   ° ° °Continue over the counter cold medication, advil and tylenol.  Increase your fluids and rest.  ° ° °Call if no improvement   ° °

## 2018-07-08 NOTE — Progress Notes (Signed)
Subjective:    Patient ID: Alyssa Robinson, female    DOB: 02/07/1951, 68 y.o.   MRN: [id number]  HPI She is here for an acute visit for cold symptoms.   2 months ago she had a sinus infection and went to urgent care.  She was treated with cefdinir.  It helped but her symptoms persisted.  She took mucinex and Human resources officer.  She still has a nagging cough.  She does not feel like her infection was completely treated.  Her cough is primarily dry, but she can feel phlegm in her chest that she is having difficulty getting up.  She states nasal congestion with significant postnasal drip, sneezing and sore throat.  She has not had any fevers, sinus pain, shortness of breath, nausea, body aches or headaches.  She has taken Mucinex and Allegra and it helps, but now she feels her symptoms are getting worse.    Medications and allergies reviewed with patient and updated if appropriate.  Patient Active Problem List   Diagnosis Date Noted  . Subacute maxillary sinusitis 07/08/2018  . Dermatitis of external ear 01/30/2018  . Thrombocytopenia ([place]) 10/17/2017  . Hyperlipidemia with target LDL less than 130 09/14/2015  . Vaginal atrophy 06/01/2015  . 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 Routine general medical examination at a health care facility 12/11/2012  . Essential hypertension, benign 12/11/2012  . Constipation 10/17/2012    Current Outpatient Medications on File Prior to Visit  Medication Sig Dispense Refill  . fexofenadine (ALLEGRA) 180 MG tablet Take 180 mg by mouth daily as needed for allergies.     . Pediatric Multiple Vit-C-FA (PEDIATRIC MULTIVITAMIN) chewable tablet Chew by mouth.    . Probiotic Product ([place]) 170 MG CAPS Take by mouth.    . sodium chloride (OCEAN) 0.65 % SOLN nasal spray Place 1 spray into both nostrils as needed for congestion.     No current facility-administered medications on file prior to visit.     Past Medical History:  Diagnosis Date  .  Anemia    hx  . Bronchitis   . Bronchitis    hx  . Diverticulosis   . GERD (gastroesophageal reflux disease)    occ  . Headache(784.0)    migraines occ  . Hypertension    no rx for 2 yrs  . Seasonal allergies     Past Surgical History:  Procedure Laterality Date  . ABDOMINAL HYSTERECTOMY     68 y/o  . APPENDECTOMY  03/17/2013   Procedure: INCIDENTAL APPENDECTOMY;  Surgeon: Gwenyth Ober, MD;  Location: [place];  Service: General;;  . capsule endoscopy    . COLONOSCOPY     in 2012 -   . COLOSTOMY CLOSURE  03/17/2013  . COLOSTOMY REVERSAL  03/17/2013   Procedure: COLOSTOMY REVERSAL;  Surgeon: Gwenyth Ober, MD;  Location: [place];  Service: General;;  . EUS N/A 04/30/2015   Procedure: UPPER ENDOSCOPIC ULTRASOUND (EUS) LINEAR;  Surgeon: Carol Ada, MD;  Location: [facility];  Service: Endoscopy;  Laterality: N/A;  . HERNIA REPAIR     X2 2-4 y/o inguinal  . LAPAROTOMY N/A 10/17/2012   Procedure: EXPLORATORY LAPAROTOMY,  COLON RESECTION AND COLOSTOMY;  Surgeon: Gwenyth Ober, MD;  Location: [place];  Service: General;  Laterality: N/A;  . LYSIS OF ADHESION  03/17/2013   Procedure: LYSIS OF ADHESION;  Surgeon: Gwenyth Ober, MD;  Location: [place];  Service: General;;  . UPPER GASTROINTESTINAL ENDOSCOPY  2013 -    Social History   Socioeconomic History  . Marital status: Married    Spouse name: Not on file  . Number of children: Not on file  . Years of education: Not on file  . Highest education level: Not on file  Occupational History  . Not on file  Social Needs  . Financial resource strain: Not hard at all  . Food insecurity:    Worry: Never true    Inability: Never true  . Transportation needs:    Medical: No    Non-medical: No  Tobacco Use  . Smoking status: Never Smoker  . Smokeless tobacco: Never Used  . Tobacco comment: EXPOSED TO SECOND HAND SMOKE   Substance and Sexual Activity  . Alcohol use: Yes    Alcohol/week: 2.0 standard drinks    Types: 2 Glasses  of wine per week    Comment: occasional  . Drug use: No  . Sexual activity: Not Currently  Lifestyle  . Physical activity:    Days per week: 7 days    Minutes per session: 50 min  . Stress: Not at all  Relationships  . Social connections:    Talks on phone: More than three times a week    Gets together: More than three times a week    Attends religious service: More than 4 times per year    Active member of club or organization: Yes    Attends meetings of clubs or organizations: More than 4 times per year    Relationship status: Married  Other Topics Concern  . Not on file  Social History Narrative  . Not on file    Family History  Problem Relation Age of Onset  . Esophageal cancer Mother        and ovarian cancer  . Alcohol abuse Mother   . Arthritis Mother   . Heart disease Father   . Alcohol abuse Father   . Hypertension Father   . Diabetes Father   . Early death Father   . Colon cancer Other   . Pancreatic cancer Other   . Stroke Neg Hx   . Hyperlipidemia Neg Hx   . Kidney disease Neg Hx   . COPD Neg Hx     Review of Systems  Constitutional: Negative for chills and fever.  HENT: Positive for congestion, sneezing and sore throat. Negative for ear pain and sinus pain.   Respiratory: Positive for cough (dry and can feel sputum in the chest). Negative for chest tightness, shortness of breath and wheezing.   Gastrointestinal: Negative for diarrhea and nausea.  Musculoskeletal: Negative for myalgias.  Neurological: Negative for light-headedness and headaches.       Objective:   Vitals:   07/08/18 1005  BP: (!) 170/92  Pulse: 78  Resp: 16  Temp: 98.4 F (36.9 C)  SpO2: 98%   Filed Weights   07/08/18 1005  Weight: 113 lb (51.3 kg)   Body mass index is 22.82 kg/m.  BP Readings from Last 3 Encounters:  07/08/18 (!) 170/92  06/09/18 (!) 161/84  05/13/18 124/90    Wt Readings from Last 3 Encounters:  07/08/18 113 lb (51.3 kg)  06/09/18 110 lb  (49.9 kg)  05/13/18 113 lb (51.3 kg)     Physical Exam GENERAL APPEARANCE: Appears stated age, well appearing, NAD EYES: conjunctiva clear, no icterus HEENT: bilateral tympanic membranes and ear canals normal, oropharynx with mild erythema, no thyromegaly, trachea midline, no cervical or supraclavicular lymphadenopathy  LUNGS: Clear to auscultation without wheeze or crackles, unlabored breathing, good air entry bilaterally CARDIOVASCULAR: Normal S1,S2 without murmurs, no edema SKIN: warm, dry        Assessment & Plan:   See Problem List for Assessment and Plan of chronic medical problems.

## 2018-07-08 NOTE — Assessment & Plan Note (Signed)
Concern for partially treated sinus infection She did take an antibiotic from urgent care, but has continued to have symptoms although not as severe.  Despite Allegra and Mucinex symptoms are getting worse Start amoxicillin 3 times daily x10 days Continue Allegra and Mucinex Follow-up symptoms do not improve

## 2018-08-22 DIAGNOSIS — K600 Acute anal fissure: Secondary | ICD-10-CM | POA: Diagnosis not present

## 2018-08-22 DIAGNOSIS — Z800 Family history of malignant neoplasm of digestive organs: Secondary | ICD-10-CM | POA: Diagnosis not present

## 2018-08-22 DIAGNOSIS — K625 Hemorrhage of anus and rectum: Secondary | ICD-10-CM | POA: Diagnosis not present

## 2018-08-22 DIAGNOSIS — R194 Change in bowel habit: Secondary | ICD-10-CM | POA: Diagnosis not present

## 2018-08-22 DIAGNOSIS — K862 Cyst of pancreas: Secondary | ICD-10-CM | POA: Diagnosis not present

## 2018-08-22 DIAGNOSIS — Z8371 Family history of colonic polyps: Secondary | ICD-10-CM | POA: Diagnosis not present

## 2018-08-22 DIAGNOSIS — Z1211 Encounter for screening for malignant neoplasm of colon: Secondary | ICD-10-CM | POA: Diagnosis not present

## 2018-09-17 DIAGNOSIS — K6289 Other specified diseases of anus and rectum: Secondary | ICD-10-CM | POA: Diagnosis not present

## 2018-09-17 DIAGNOSIS — K601 Chronic anal fissure: Secondary | ICD-10-CM | POA: Diagnosis not present

## 2018-10-28 ENCOUNTER — Telehealth: Payer: Self-pay | Admitting: Internal Medicine

## 2018-10-28 NOTE — Telephone Encounter (Signed)
Patient would like to transfer her care from Dr Ronnald Ramp to Dr Sharlet Salina. Dr Ronnald Ramp, is this okay with you? Dr Sharlet Salina, is this okay with you?

## 2018-10-31 NOTE — Telephone Encounter (Signed)
Would recommend to wait until next routine care for switch until acute need given pandemic. Okay to switch.

## 2018-11-11 NOTE — Telephone Encounter (Signed)
Yes, ok with me  TJ

## 2018-11-11 NOTE — Telephone Encounter (Signed)
Waiting on response from Dr Ronnald Ramp to approve transfer.

## 2018-11-11 NOTE — Telephone Encounter (Signed)
Patient called back and we discussed scheduling a routine visit with Dr Sharlet Salina. She will call back after she has completed other routine visits and will schedule with Dr Sharlet Salina in the fall.

## 2018-11-11 NOTE — Telephone Encounter (Signed)
Spoke to patient to schedule, call dropped. I have tried to call patient back multiple times.  Waiting on call back.

## 2018-12-11 DIAGNOSIS — R194 Change in bowel habit: Secondary | ICD-10-CM | POA: Diagnosis not present

## 2018-12-11 DIAGNOSIS — D125 Benign neoplasm of sigmoid colon: Secondary | ICD-10-CM | POA: Diagnosis not present

## 2018-12-11 DIAGNOSIS — Z1211 Encounter for screening for malignant neoplasm of colon: Secondary | ICD-10-CM | POA: Diagnosis not present

## 2018-12-11 DIAGNOSIS — K625 Hemorrhage of anus and rectum: Secondary | ICD-10-CM | POA: Diagnosis not present

## 2018-12-16 DIAGNOSIS — Z23 Encounter for immunization: Secondary | ICD-10-CM | POA: Diagnosis not present

## 2018-12-26 DIAGNOSIS — L570 Actinic keratosis: Secondary | ICD-10-CM | POA: Diagnosis not present

## 2018-12-26 DIAGNOSIS — L218 Other seborrheic dermatitis: Secondary | ICD-10-CM | POA: Diagnosis not present

## 2019-01-07 ENCOUNTER — Telehealth: Payer: Self-pay | Admitting: Internal Medicine

## 2019-01-07 NOTE — Telephone Encounter (Signed)
Called patient regarding AWV. Left message for patient to call office to schedule wellness appointment.

## 2019-01-28 ENCOUNTER — Ambulatory Visit (INDEPENDENT_AMBULATORY_CARE_PROVIDER_SITE_OTHER): Payer: Medicare Other | Admitting: Internal Medicine

## 2019-01-28 ENCOUNTER — Other Ambulatory Visit (INDEPENDENT_AMBULATORY_CARE_PROVIDER_SITE_OTHER): Payer: Medicare Other

## 2019-01-28 ENCOUNTER — Other Ambulatory Visit: Payer: Self-pay

## 2019-01-28 ENCOUNTER — Encounter: Payer: Self-pay | Admitting: Internal Medicine

## 2019-01-28 ENCOUNTER — Ambulatory Visit (INDEPENDENT_AMBULATORY_CARE_PROVIDER_SITE_OTHER)
Admission: RE | Admit: 2019-01-28 | Discharge: 2019-01-28 | Disposition: A | Discharge status: Home / Self Care | Payer: Medicare Other | Source: Ambulatory Visit | Attending: Internal Medicine | Admitting: Internal Medicine

## 2019-01-28 VITALS — BP 126/80 | HR 78 | Temp 97.8°F | Ht 59.0 in | Wt 113.0 lb

## 2019-01-28 DIAGNOSIS — E559 Vitamin D deficiency, unspecified: Secondary | ICD-10-CM | POA: Diagnosis not present

## 2019-01-28 DIAGNOSIS — E2839 Other primary ovarian failure: Secondary | ICD-10-CM

## 2019-01-28 DIAGNOSIS — I10 Essential (primary) hypertension: Secondary | ICD-10-CM | POA: Diagnosis not present

## 2019-01-28 DIAGNOSIS — J3089 Other allergic rhinitis: Secondary | ICD-10-CM | POA: Diagnosis not present

## 2019-01-28 DIAGNOSIS — Z0000 Encounter for general adult medical examination without abnormal findings: Secondary | ICD-10-CM

## 2019-01-28 DIAGNOSIS — Z8619 Personal history of other infectious and parasitic diseases: Secondary | ICD-10-CM | POA: Diagnosis not present

## 2019-01-28 DIAGNOSIS — K862 Cyst of pancreas: Secondary | ICD-10-CM

## 2019-01-28 DIAGNOSIS — M8580 Other specified disorders of bone density and structure, unspecified site: Secondary | ICD-10-CM | POA: Diagnosis not present

## 2019-01-28 DIAGNOSIS — J309 Allergic rhinitis, unspecified: Secondary | ICD-10-CM | POA: Insufficient documentation

## 2019-01-28 LAB — COMPREHENSIVE METABOLIC PANEL
ALT: 15 U/L (ref 0–35)
AST: 21 U/L (ref 0–37)
Albumin: 4.5 g/dL (ref 3.5–5.2)
Alkaline Phosphatase: 111 U/L (ref 39–117)
BUN: 18 mg/dL (ref 6–23)
CO2: 29 mEq/L (ref 19–32)
Calcium: 9.9 mg/dL (ref 8.4–10.5)
Chloride: 103 mEq/L (ref 96–112)
Creatinine, Ser: 0.71 mg/dL (ref 0.40–1.20)
GFR: 81.45 mL/min (ref >60.00)
Glucose, Bld: 91 mg/dL (ref 70–99)
Potassium: 5 mEq/L (ref 3.5–5.1)
Sodium: 139 mEq/L (ref 135–145)
Total Bilirubin: 0.7 mg/dL (ref 0.2–1.2)
Total Protein: 7.3 g/dL (ref 6.0–8.3)

## 2019-01-28 LAB — CBC
HCT: 44 % (ref 36.0–46.0)
Hemoglobin: 14.7 g/dL (ref 12.0–15.0)
MCHC: 33.3 g/dL (ref 30.0–36.0)
MCV: 98.8 fl (ref 78.0–100.0)
Platelets: 142 10*3/uL — ABNORMAL LOW (ref 150.0–400.0)
RBC: 4.45 Mil/uL (ref 3.87–5.11)
RDW: 14.4 % (ref 11.5–15.5)
WBC: 4.5 10*3/uL (ref 4.0–10.5)

## 2019-01-28 LAB — TSH: TSH: 2.22 u[IU]/mL (ref 0.35–4.50)

## 2019-01-28 LAB — LIPID PANEL
Cholesterol: 189 mg/dL (ref 0–200)
HDL: 79 mg/dL (ref >39.00)
LDL Cholesterol: 90 mg/dL (ref 0–99)
NonHDL: 109.92
Total CHOL/HDL Ratio: 2
Triglycerides: 102 mg/dL (ref 0.0–149.0)
VLDL: 20.4 mg/dL (ref 0.0–40.0)

## 2019-01-28 LAB — VITAMIN D 25 HYDROXY (VIT D DEFICIENCY, FRACTURES): VITD: 50.69 ng/mL (ref 30.00–100.00)

## 2019-01-28 MED ORDER — ZOSTER VAC RECOMB ADJUVANTED 50 MCG/0.5ML IM SUSR: 0.5000 mL | Freq: Once | INTRAMUSCULAR | 1 refills | Status: AC

## 2019-01-28 NOTE — Assessment & Plan Note (Signed)
Checking vitamin D and bone density. Adjust as needed.

## 2019-01-28 NOTE — Patient Instructions (Signed)
Health Maintenance, Female Adopting a healthy lifestyle and getting preventive care are important in promoting health and wellness. Ask your health care provider about:  The right schedule for you to have regular tests and exams.  Things you can do on your own to prevent diseases and keep yourself healthy. What should I know about diet, weight, and exercise? Eat a healthy diet   Eat a diet that includes plenty of vegetables, fruits, low-fat dairy products, and lean protein.  Do not eat a lot of foods that are high in solid fats, added sugars, or sodium. Maintain a healthy weight Body mass index (BMI) is used to identify weight problems. It estimates body fat based on height and weight. Your health care provider can help determine your BMI and help you achieve or maintain a healthy weight. Get regular exercise Get regular exercise. This is one of the most important things you can do for your health. Most adults should:  Exercise for at least 150 minutes each week. The exercise should increase your heart rate and make you sweat (moderate-intensity exercise).  Do strengthening exercises at least twice a week. This is in addition to the moderate-intensity exercise.  Spend less time sitting. Even light physical activity can be beneficial. Watch cholesterol and blood lipids Have your blood tested for lipids and cholesterol at 69 years of age, then have this test every 5 years. Have your cholesterol levels checked more often if:  Your lipid or cholesterol levels are high.  You are older than 69 years of age.  You are at high risk for heart disease. What should I know about cancer screening? Depending on your health history and family history, you may need to have cancer screening at various ages. This may include screening for:  Breast cancer.  Cervical cancer.  Colorectal cancer.  Skin cancer.  Lung cancer. What should I know about heart disease, diabetes, and high blood  pressure? Blood pressure and heart disease  High blood pressure causes heart disease and increases the risk of stroke. This is more likely to develop in people who have high blood pressure readings, are of African descent, or are overweight.  Have your blood pressure checked: ? Every 3-5 years if you are 18-39 years of age. ? Every year if you are 40 years old or older. Diabetes Have regular diabetes screenings. This checks your fasting blood sugar level. Have the screening done:  Once every three years after age 40 if you are at a normal weight and have a low risk for diabetes.  More often and at a younger age if you are overweight or have a high risk for diabetes. What should I know about preventing infection? Hepatitis B If you have a higher risk for hepatitis B, you should be screened for this virus. Talk with your health care provider to find out if you are at risk for hepatitis B infection. Hepatitis C Testing is recommended for:  Everyone born from 1945 through 1965.  Anyone with known risk factors for hepatitis C. Sexually transmitted infections (STIs)  Get screened for STIs, including gonorrhea and chlamydia, if: ? You are sexually active and are younger than 69 years of age. ? You are older than 69 years of age and your health care provider tells you that you are at risk for this type of infection. ? Your sexual activity has changed since you were last screened, and you are at increased risk for chlamydia or gonorrhea. Ask your health care provider if   you are at risk.  Ask your health care provider about whether you are at high risk for HIV. Your health care provider may recommend a prescription medicine to help prevent HIV infection. If you choose to take medicine to prevent HIV, you should first get tested for HIV. You should then be tested every 3 months for as long as you are taking the medicine. Pregnancy  If you are about to stop having your period (premenopausal) and  you may become pregnant, seek counseling before you get pregnant.  Take 400 to 800 micrograms (mcg) of folic acid every day if you become pregnant.  Ask for birth control (contraception) if you want to prevent pregnancy. Osteoporosis and menopause Osteoporosis is a disease in which the bones lose minerals and strength with aging. This can result in bone fractures. If you are 65 years old or older, or if you are at risk for osteoporosis and fractures, ask your health care provider if you should:  Be screened for bone loss.  Take a calcium or vitamin D supplement to lower your risk of fractures.  Be given hormone replacement therapy (HRT) to treat symptoms of menopause. Follow these instructions at home: Lifestyle  Do not use any products that contain nicotine or tobacco, such as cigarettes, e-cigarettes, and chewing tobacco. If you need help quitting, ask your health care provider.  Do not use street drugs.  Do not share needles.  Ask your health care provider for help if you need support or information about quitting drugs. Alcohol use  Do not drink alcohol if: ? Your health care provider tells you not to drink. ? You are pregnant, may be pregnant, or are planning to become pregnant.  If you drink alcohol: ? Limit how much you use to 0-1 drink a day. ? Limit intake if you are breastfeeding.  Be aware of how much alcohol is in your drink. In the [place] one drink equals one 12 oz bottle of beer (355 mL), one 5 oz glass of wine (148 mL), or one 1 oz glass of hard liquor (44 mL). General instructions  Schedule regular health, dental, and eye exams.  Stay current with your vaccines.  Tell your health care provider if: ? You often feel depressed. ? You have ever been abused or do not feel safe at home. Summary  Adopting a healthy lifestyle and getting preventive care are important in promoting health and wellness.  Follow your health care provider's instructions about healthy  diet, exercising, and getting tested or screened for diseases.  Follow your health care provider's instructions on monitoring your cholesterol and blood pressure. This information is not intended to replace advice given to you by your health care provider. Make sure you discuss any questions you have with your health care provider. Document Released: 10/24/2010 Document Revised: 04/03/2018 Document Reviewed: 04/03/2018 Elsevier Patient Education  2020 Elsevier Inc.  

## 2019-01-28 NOTE — Assessment & Plan Note (Signed)
Encouraged shingrix and given rx today. Counseled about risk given her history and prior zostavax.

## 2019-01-28 NOTE — Assessment & Plan Note (Signed)
BP at goal off meds and is active.

## 2019-01-28 NOTE — Assessment & Plan Note (Signed)
Takes allegra year round, no signs of infection today.

## 2019-01-28 NOTE — Assessment & Plan Note (Signed)
Flu shot complete for season. Pneumonia complete. Shingrix given rx. Tetanus due 2027. Colonoscopy due 2023. Mammogram due 2022, pap smear aged out and dexa due ordered today. Counseled about sun safety and mole surveillance. Counseled about the dangers of distracted driving. Given 10 year screening recommendations.

## 2019-01-28 NOTE — Progress Notes (Signed)
Subjective:   Patient ID: Alyssa Robinson, female    DOB: [birth year], 68 y.o.   MRN: [id number]  HPI Here for medicare wellness, no new complaints. Please see A/P for status and treatment of chronic medical problems.   HPI #2: Here for follow up osteopenia (last score -2.1 about 2 years ago, does walking with weights daily, takes multivitamin, prior vitamin D deficiency), and allergies (states she normally gets bronchitis every year and usually requires antibiotics to help, takes allegra daily to help with symptoms, does have chronic non-productive cough which is stable recently) and history of shingles (took zostavax and still got shingles, was not sure if she should have new shingles vaccine, denies current infection and shingles was more than 1 year ago).   Diet: heart healthy Physical activity: sedentary Depression/mood screen: negative Hearing: intact to whispered voice Visual acuity: grossly normal with lens, performs annual eye exam  ADLs: capable Fall risk: none Home safety: good Cognitive evaluation: intact to orientation, naming, recall and repetition EOL planning: adv directives discussed, in place    Office Visit from 01/28/2019 in [place]  PHQ-2 Total Score  0        Office Visit from 09/19/2016 in [place]  PHQ-9 Total Score  1     I have personally reviewed and have noted 1. The patient's medical and social history - reviewed today no changes 2. Their use of alcohol, tobacco or illicit drugs 3. Their current medications and supplements 4. The patient's functional ability including ADL's, fall risks, home safety risks and hearing or visual impairment. 5. Diet and physical activities 6. Evidence for depression or mood disorders 7. Care team reviewed and updated  Patient Care Team: Hoyt Koch, MD as PCP - General (Internal Medicine) Past Medical History:  Diagnosis Date  . Anemia    hx  .  Bronchitis   . Bronchitis    hx  . Diverticulosis   . GERD (gastroesophageal reflux disease)    occ  . Headache(784.0)    migraines occ  . Hypertension    no rx for 2 yrs  . Seasonal allergies    Past Surgical History:  Procedure Laterality Date  . ABDOMINAL HYSTERECTOMY     68 y/o  . APPENDECTOMY  03/17/2013   Procedure: INCIDENTAL APPENDECTOMY;  Surgeon: Gwenyth Ober, MD;  Location: [place];  Service: General;;  . capsule endoscopy    . COLONOSCOPY     in 2012 -   . COLOSTOMY CLOSURE  03/17/2013  . COLOSTOMY REVERSAL  03/17/2013   Procedure: COLOSTOMY REVERSAL;  Surgeon: Gwenyth Ober, MD;  Location: [place];  Service: General;;  . EUS N/A 04/30/2015   Procedure: UPPER ENDOSCOPIC ULTRASOUND (EUS) LINEAR;  Surgeon: Carol Ada, MD;  [facility];  Service: Endoscopy;  Laterality: N/A;  . HERNIA REPAIR     X2 2-4 y/o inguinal  . LAPAROTOMY N/A 10/17/2012   Procedure: EXPLORATORY LAPAROTOMY,  COLON RESECTION AND COLOSTOMY;  Surgeon: Gwenyth Ober, MD;  Location: [place];  Service: General;  Laterality: N/A;  . LYSIS OF ADHESION  03/17/2013   Procedure: LYSIS OF ADHESION;  Surgeon: Gwenyth Ober, MD;  Location: [place];  Service: General;;  . UPPER GASTROINTESTINAL ENDOSCOPY     2013 -   Family History  Problem Relation Age of Onset  . Esophageal cancer Mother        and ovarian cancer  . Alcohol abuse Mother   .  Arthritis Mother   . Heart disease Father   . Alcohol abuse Father   . Hypertension Father   . Diabetes Father   . Early death Father   . Colon cancer Other   . Pancreatic cancer Other   . Stroke Neg Hx   . Hyperlipidemia Neg Hx   . Kidney disease Neg Hx   . COPD Neg Hx    Review of Systems  Constitutional: Negative.   HENT: Negative.   Eyes: Negative.   Respiratory: Negative for cough, chest tightness and shortness of breath.   Cardiovascular: Negative for chest pain, palpitations and leg swelling.  Gastrointestinal: Negative for abdominal  distention, abdominal pain, constipation, diarrhea, nausea and vomiting.  Musculoskeletal: Negative.   Skin: Negative.   Neurological: Negative.   Psychiatric/Behavioral: Negative.     Objective:  Physical Exam Constitutional:      Appearance: She is well-developed.  HENT:     Head: Normocephalic and atraumatic.  Neck:     Musculoskeletal: Normal range of motion.  Cardiovascular:     Rate and Rhythm: Normal rate and regular rhythm.  Pulmonary:     Effort: Pulmonary effort is normal. No respiratory distress.     Breath sounds: Normal breath sounds. No wheezing or rales.  Abdominal:     General: Bowel sounds are normal. There is no distension.     Palpations: Abdomen is soft.     Tenderness: There is no abdominal tenderness. There is no rebound.  Skin:    General: Skin is warm and dry.  Neurological:     Mental Status: She is alert and oriented to person, place, and time.     Coordination: Coordination normal.     Vitals:   01/28/19 1006  BP: 126/80  Pulse: 78  Temp: 97.8 F (36.6 C)  TempSrc: Oral  SpO2: 96%  Weight: 113 lb (51.3 kg)  Height: 4\' 11"  (1.499 m)    Assessment & Plan:

## 2019-01-28 NOTE — Assessment & Plan Note (Signed)
Stable with MRI Jan 2020 and next due 2 years. GI orders. Dr. Collene Mares.

## 2019-05-22 ENCOUNTER — Ambulatory Visit: Payer: Medicare Other

## 2019-05-30 DIAGNOSIS — H40013 Open angle with borderline findings, low risk, bilateral: Secondary | ICD-10-CM | POA: Diagnosis not present

## 2019-05-31 ENCOUNTER — Ambulatory Visit: Payer: Medicare Other | Attending: Internal Medicine

## 2019-05-31 DIAGNOSIS — Z23 Encounter for immunization: Secondary | ICD-10-CM | POA: Insufficient documentation

## 2019-05-31 NOTE — Progress Notes (Signed)
   Covid-19 Vaccination Clinic  Name:  Alyssa Robinson    MRN: [id number] DOB: 1950-05-03  05/31/2019  [name] was observed post Covid-19 immunization for 15 minutes without incidence. She was provided with Vaccine Information Sheet and instruction to access the V-Safe system.   [name] was instructed to call 911 with any severe reactions post vaccine: Marland Kitchen Difficulty breathing  . Swelling of your face and throat  . A fast heartbeat  . A bad rash all over your body  . Dizziness and weakness    Immunizations Administered    Name Date Dose VIS Date Route   Pfizer COVID-19 Vaccine 05/31/2019  8:37 AM 0.3 mL 04/04/2019 Intramuscular   Manufacturer: [place]   Lot: [id number]   [place]: [id number]

## 2019-06-08 ENCOUNTER — Ambulatory Visit: Payer: Medicare Other

## 2019-06-09 ENCOUNTER — Encounter: Payer: Self-pay | Admitting: Internal Medicine

## 2019-06-09 DIAGNOSIS — Z1231 Encounter for screening mammogram for malignant neoplasm of breast: Secondary | ICD-10-CM | POA: Diagnosis not present

## 2019-06-09 LAB — HM MAMMOGRAPHY

## 2019-06-16 DIAGNOSIS — H5203 Hypermetropia, bilateral: Secondary | ICD-10-CM | POA: Diagnosis not present

## 2019-06-16 DIAGNOSIS — H52203 Unspecified astigmatism, bilateral: Secondary | ICD-10-CM | POA: Diagnosis not present

## 2019-06-16 DIAGNOSIS — H25813 Combined forms of age-related cataract, bilateral: Secondary | ICD-10-CM | POA: Diagnosis not present

## 2019-06-16 DIAGNOSIS — H524 Presbyopia: Secondary | ICD-10-CM | POA: Diagnosis not present

## 2019-06-16 DIAGNOSIS — H40013 Open angle with borderline findings, low risk, bilateral: Secondary | ICD-10-CM | POA: Diagnosis not present

## 2019-06-25 ENCOUNTER — Ambulatory Visit: Payer: Medicare Other | Attending: Internal Medicine

## 2019-06-25 DIAGNOSIS — Z23 Encounter for immunization: Secondary | ICD-10-CM | POA: Insufficient documentation

## 2019-06-25 NOTE — Progress Notes (Signed)
   Covid-19 Vaccination Clinic  Name:  Alyssa Robinson    MRN: [id number] DOB: 1950/12/11  06/25/2019  [name] was observed post Covid-19 immunization for 30 minutes based on pre-vaccination screening without incident. She was provided with Vaccine Information Sheet and instruction to access the V-Safe system.   [name] was instructed to call 911 with any severe reactions post vaccine: Marland Kitchen Difficulty breathing  . Swelling of face and throat  . A fast heartbeat  . A bad rash all over body  . Dizziness and weakness   Immunizations Administered    Name Date Dose VIS Date Route   Pfizer COVID-19 Vaccine 06/25/2019  4:01 PM 0.3 mL 04/04/2019 Intramuscular   Manufacturer: [place]   Lot: [id number]   [place]: [id number]

## 2019-08-04 ENCOUNTER — Other Ambulatory Visit: Payer: Self-pay

## 2019-08-04 ENCOUNTER — Encounter: Payer: Self-pay | Admitting: Internal Medicine

## 2019-08-04 ENCOUNTER — Ambulatory Visit (INDEPENDENT_AMBULATORY_CARE_PROVIDER_SITE_OTHER): Payer: Medicare Other | Admitting: Internal Medicine

## 2019-08-04 VITALS — BP 142/80 | HR 82 | Temp 98.4°F | Ht 59.0 in | Wt 113.8 lb

## 2019-08-04 DIAGNOSIS — D171 Benign lipomatous neoplasm of skin and subcutaneous tissue of trunk: Secondary | ICD-10-CM | POA: Diagnosis not present

## 2019-08-04 NOTE — Progress Notes (Signed)
   Subjective:   Patient ID: Alyssa Robinson, female    DOB: [birth year], 69 y.o.   MRN: [id number]  HPI The patient is a 69 YO female coming in for cyst right side. Below the rib cage. Noticed it about 4-6 months ago. Denies pain in that area. Is growing some since she noticed it. Prior lipoma removal left wrist and this feels similar. Denies fevers or chills or rash on skin.   Review of Systems  Constitutional: Negative.   HENT: Negative.   Eyes: Negative.   Respiratory: Negative for cough, chest tightness and shortness of breath.   Cardiovascular: Negative for chest pain, palpitations and leg swelling.  Gastrointestinal: Negative for abdominal distention, abdominal pain, constipation, diarrhea, nausea and vomiting.  Musculoskeletal: Negative.   Skin: Negative.        Growth right abdominal wall  Neurological: Negative.   Psychiatric/Behavioral: Negative.     Objective:  Physical Exam Constitutional:      Appearance: She is well-developed.  HENT:     Head: Normocephalic and atraumatic.  Cardiovascular:     Rate and Rhythm: Normal rate and regular rhythm.  Pulmonary:     Effort: Pulmonary effort is normal. No respiratory distress.     Breath sounds: Normal breath sounds. No wheezing or rales.  Abdominal:     General: Bowel sounds are normal. There is no distension.     Palpations: Abdomen is soft.     Tenderness: There is no abdominal tenderness. There is no rebound.  Musculoskeletal:     Cervical back: Normal range of motion.  Skin:    General: Skin is warm and dry.     Comments: Right flank lipoma without signs of erythema or infection, no pain to palpation and freely mobile.   Neurological:     Mental Status: She is alert and oriented to person, place, and time.     Coordination: Coordination normal.     Vitals:   08/04/19 1030  BP: (!) 142/80  Pulse: 82  Temp: 98.4 F (36.9 C)  SpO2: 100%  Weight: 113 lb 12.8 oz (51.6 kg)  Height: 4\' 11"  (1.499 m)    This  visit occurred during the SARS-CoV-2 public health emergency.  Safety protocols were in place, including screening questions prior to the visit, additional usage of staff PPE, and extensive cleaning of exam room while observing appropriate contact time as indicated for disinfecting solutions.   Assessment & Plan:

## 2019-08-04 NOTE — Patient Instructions (Signed)
The spot is a lipoma and does not need to be taken off.

## 2019-08-04 NOTE — Assessment & Plan Note (Signed)
Typical features on exam. Advised no need for removal at this time. If growing and she desired removal to let us know or if pain or rash let us know.

## 2019-08-24 IMAGING — DX DG HAND COMPLETE 3+V*L*
3 series · 3 of 3 positions shown · non-contrast
Comparison: None.

CLINICAL DATA: 68-year-old female status post fall from ladder onto
left hand.

EXAM:
LEFT HAND - COMPLETE 3+ VIEW

[hand ap]
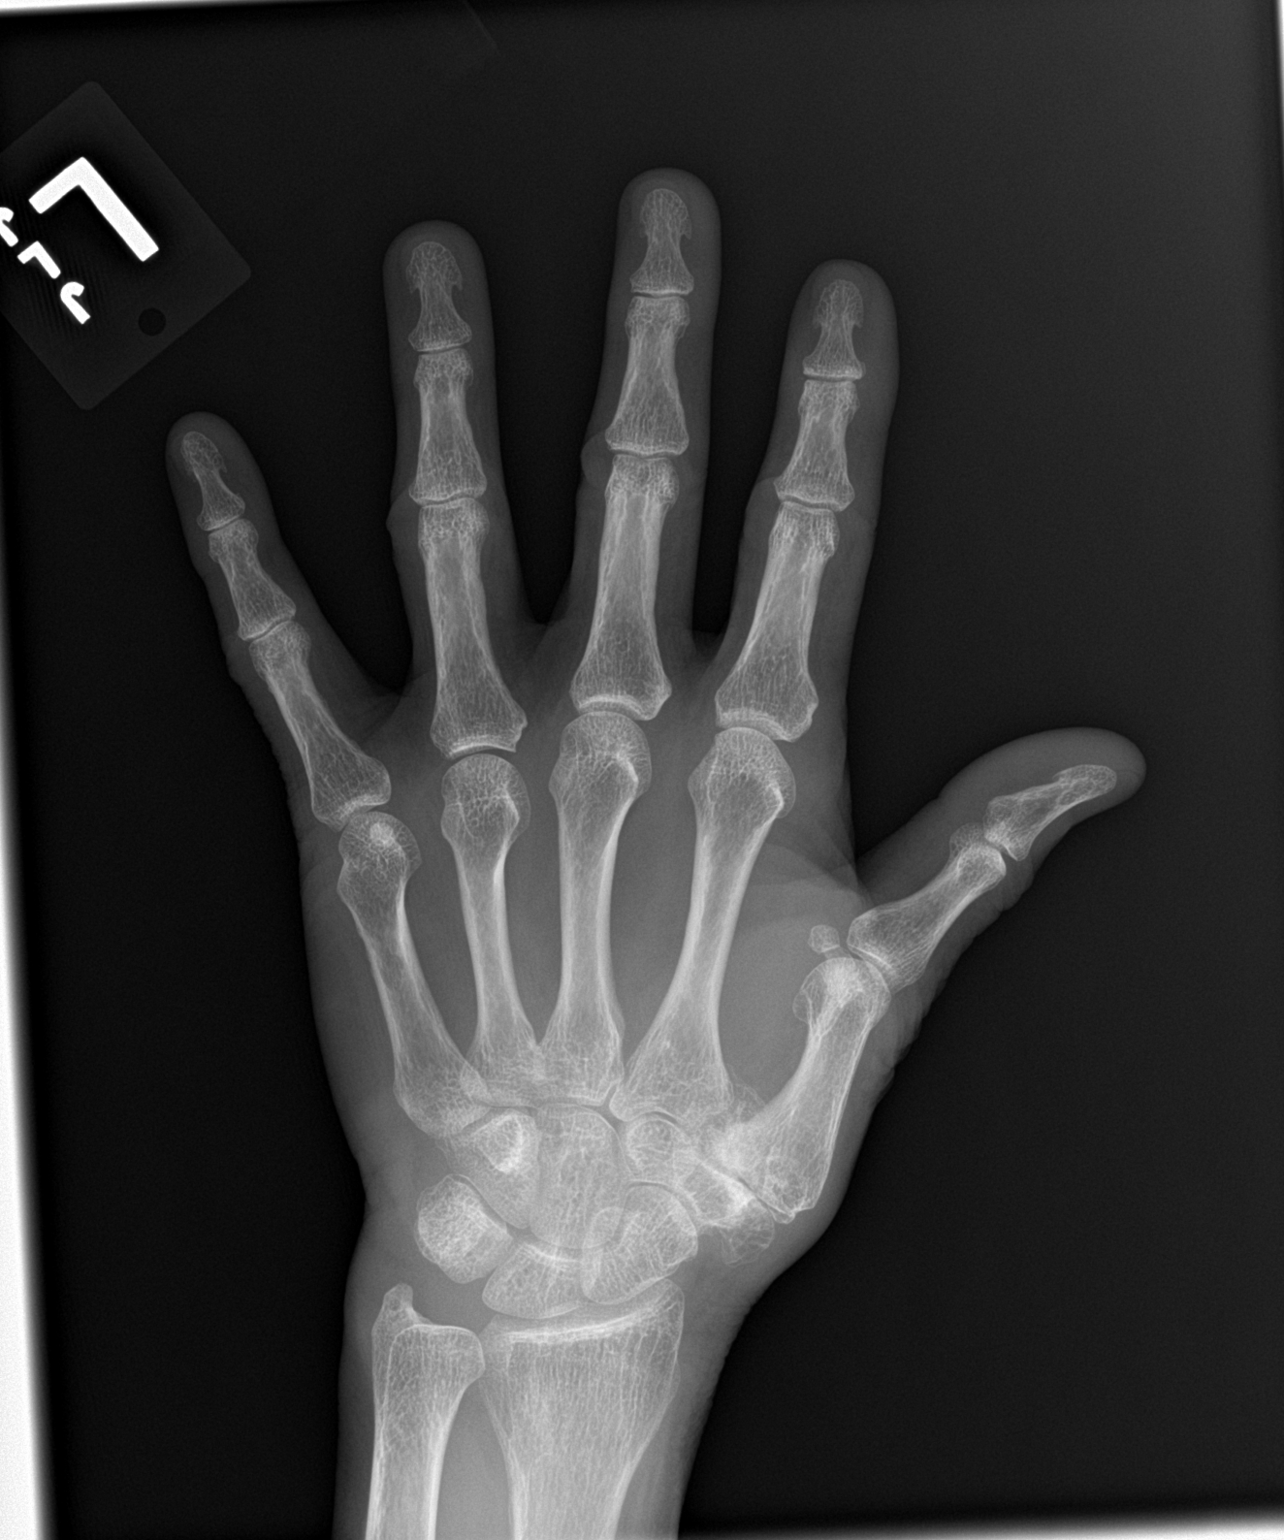

[hand obl]
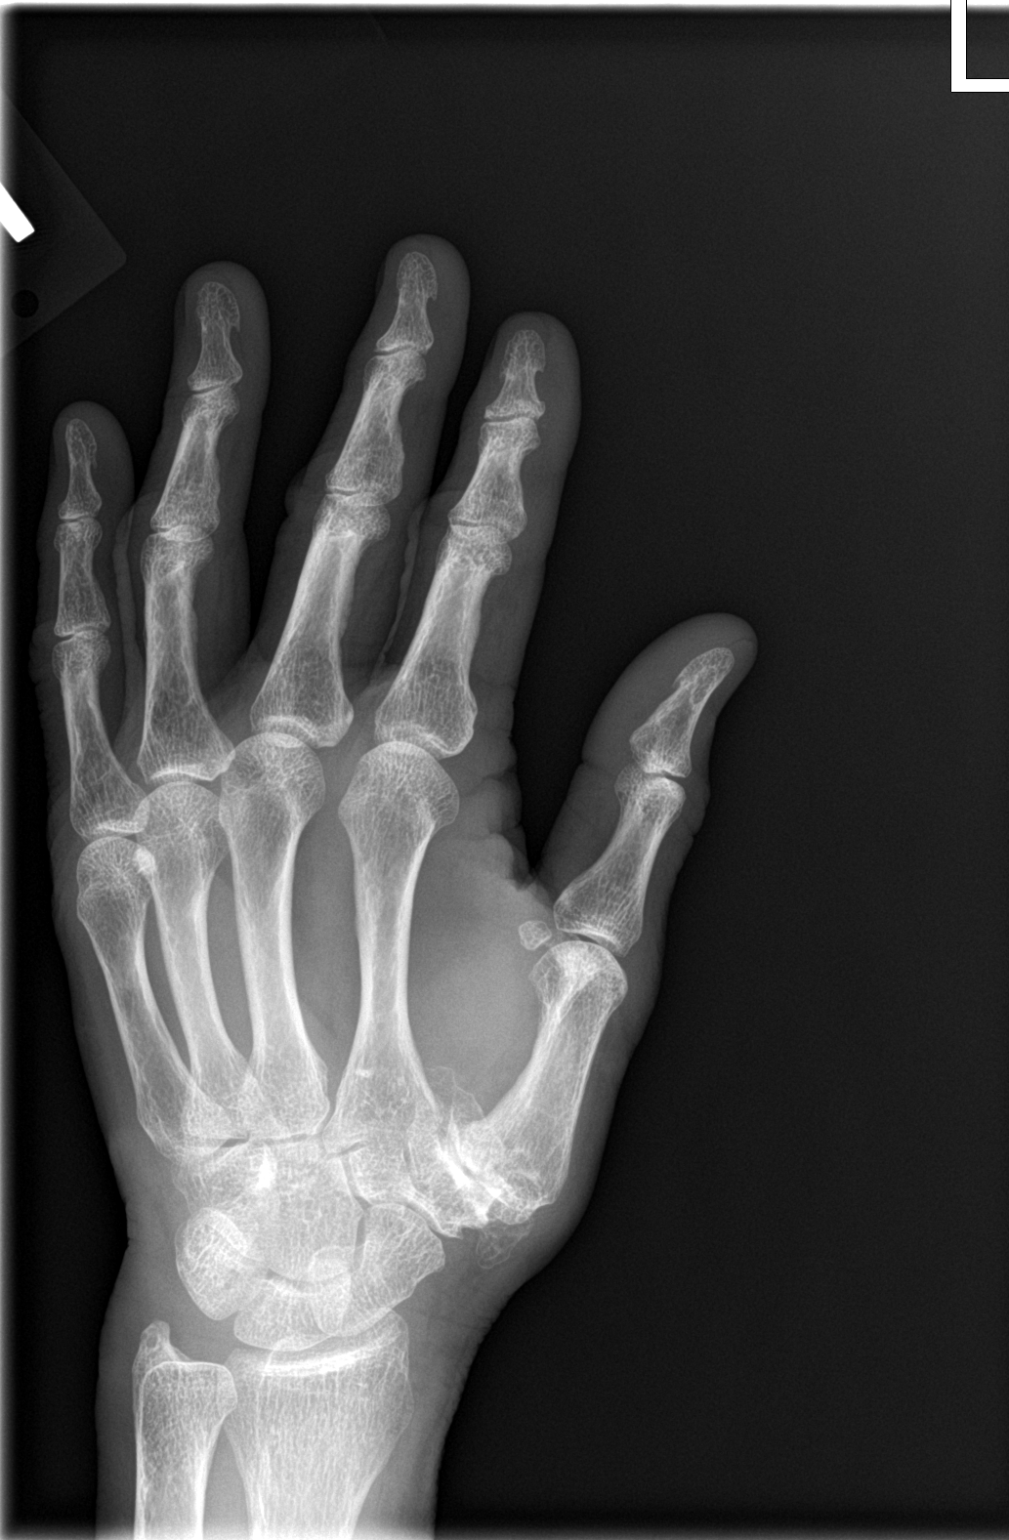

[hand lat]
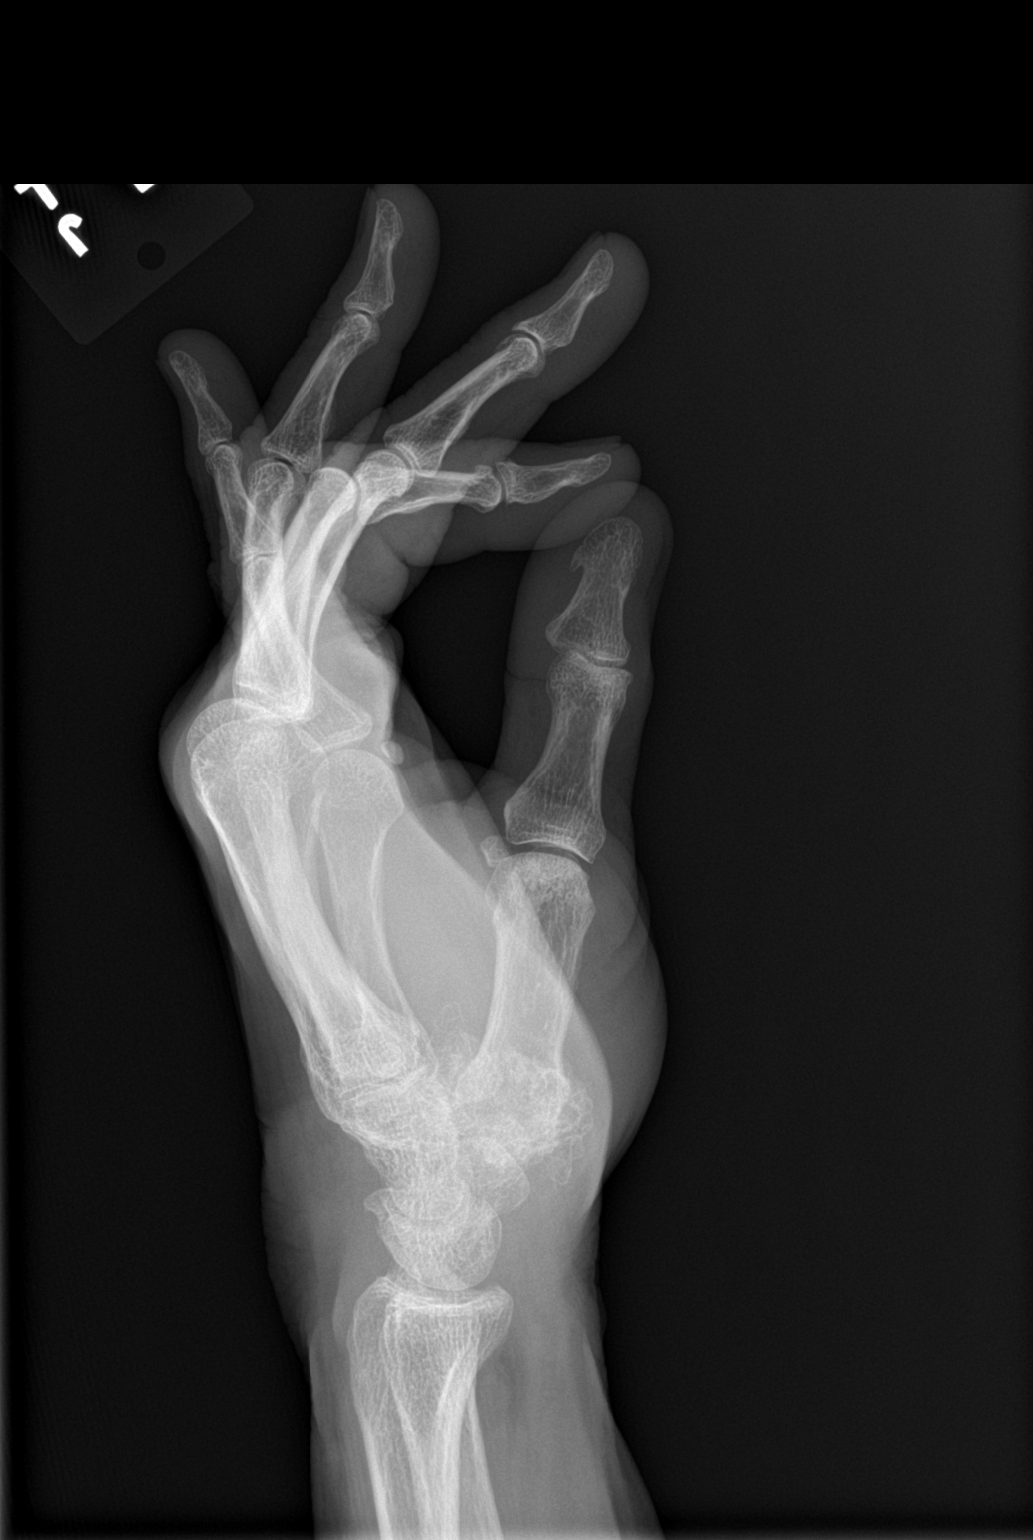

[3 of 3 positions shown; findings below may reference images not displayed]

FINDINGS: Bone mineralization is within normal limits for age. Distal radius
and ulna appear intact. Carpal bone alignment preserved.

Severe chronic degenerative changes about the 1st CMC joint with
bulky osteophytosis. No acute fracture in the region. Other joint
spaces in the left hand are normal for age. No metacarpal or phalanx
fracture identified. There is soft tissue swelling at the wrist
IMPRESSION: 1. Soft tissue swelling at the wrist. No acute fracture or
dislocation identified.
2. Severe chronic degeneration about the 1st CMC joint.

## 2019-10-14 ENCOUNTER — Ambulatory Visit (INDEPENDENT_AMBULATORY_CARE_PROVIDER_SITE_OTHER): Payer: Medicare Other | Admitting: Internal Medicine

## 2019-10-14 ENCOUNTER — Other Ambulatory Visit: Payer: Self-pay

## 2019-10-14 ENCOUNTER — Encounter: Payer: Self-pay | Admitting: Internal Medicine

## 2019-10-14 VITALS — BP 124/80 | HR 78 | Temp 97.9°F | Ht 59.0 in | Wt 111.0 lb

## 2019-10-14 DIAGNOSIS — R42 Dizziness and giddiness: Secondary | ICD-10-CM | POA: Diagnosis not present

## 2019-10-14 NOTE — Progress Notes (Signed)
   Subjective:   Patient ID: Alyssa Robinson, female    DOB: 02/21/1951, 69 y.o.   MRN: [id number]  HPI The patient is a 69 YO female coming in for concerns about dizziness. Started in the last few months with rare morning dizziness. She does get up slowly and then drink a glass of water and this helps. She does drink plenty of fluids normally and has not changed intake recently. Does not happen during the day and does not happen with standing up too quickly. She denies headaches or chest pains or sinus problems. She denies fevers or chills or recent illness.   Review of Systems  Constitutional: Negative.   HENT: Negative.   Eyes: Negative.   Respiratory: Negative for cough, chest tightness and shortness of breath.   Cardiovascular: Negative for chest pain, palpitations and leg swelling.  Gastrointestinal: Negative for abdominal distention, abdominal pain, constipation, diarrhea, nausea and vomiting.  Musculoskeletal: Negative.   Skin: Negative.   Neurological: Positive for dizziness.  Psychiatric/Behavioral: Negative.     Objective:  Physical Exam Constitutional:      Appearance: She is well-developed.  HENT:     Head: Normocephalic and atraumatic.     Right Ear: Tympanic membrane and ear canal normal. There is no impacted cerumen.     Left Ear: Tympanic membrane and ear canal normal. There is no impacted cerumen.  Cardiovascular:     Rate and Rhythm: Normal rate and regular rhythm.  Pulmonary:     Effort: Pulmonary effort is normal. No respiratory distress.     Breath sounds: Normal breath sounds. No wheezing or rales.  Abdominal:     General: Bowel sounds are normal. There is no distension.     Palpations: Abdomen is soft.     Tenderness: There is no abdominal tenderness. There is no rebound.  Musculoskeletal:     Cervical back: Normal range of motion.  Skin:    General: Skin is warm and dry.  Neurological:     Mental Status: She is alert and oriented to person, place, and  time.     Coordination: Coordination normal.     Vitals:   10/14/19 0811  BP: 124/80  Pulse: 78  Temp: 97.9 F (36.6 C)  TempSrc: Oral  SpO2: 92%  Weight: 111 lb (50.3 kg)  Height: 4\' 11"  (1.499 m)    This visit occurred during the SARS-CoV-2 public health emergency.  Safety protocols were in place, including screening questions prior to the visit, additional usage of staff PPE, and extensive cleaning of exam room while observing appropriate contact time as indicated for disinfecting solutions.   Assessment & Plan:

## 2019-10-14 NOTE — Patient Instructions (Signed)
We will have you try the exercises twice a day for 1-2 weeks then 3 times a week to see if we can get rid of the dizziness.

## 2019-10-14 NOTE — Assessment & Plan Note (Signed)
Likely element of BPPV from same position at night time. She was given and explained epley maneuver and if no resolution let us know.

## 2020-01-01 DIAGNOSIS — Z86010 Personal history of colonic polyps: Secondary | ICD-10-CM | POA: Diagnosis not present

## 2020-01-01 DIAGNOSIS — R194 Change in bowel habit: Secondary | ICD-10-CM | POA: Diagnosis not present

## 2020-01-01 DIAGNOSIS — Z1211 Encounter for screening for malignant neoplasm of colon: Secondary | ICD-10-CM | POA: Diagnosis not present

## 2020-01-15 DIAGNOSIS — R194 Change in bowel habit: Secondary | ICD-10-CM | POA: Diagnosis not present

## 2020-01-15 DIAGNOSIS — Z800 Family history of malignant neoplasm of digestive organs: Secondary | ICD-10-CM | POA: Diagnosis not present

## 2020-01-29 ENCOUNTER — Ambulatory Visit: Payer: Medicare Other | Admitting: Internal Medicine

## 2020-02-06 DIAGNOSIS — R194 Change in bowel habit: Secondary | ICD-10-CM | POA: Diagnosis not present

## 2020-02-06 DIAGNOSIS — Z800 Family history of malignant neoplasm of digestive organs: Secondary | ICD-10-CM | POA: Diagnosis not present

## 2020-02-06 DIAGNOSIS — Z1211 Encounter for screening for malignant neoplasm of colon: Secondary | ICD-10-CM | POA: Diagnosis not present

## 2020-02-06 DIAGNOSIS — Z86010 Personal history of colonic polyps: Secondary | ICD-10-CM | POA: Diagnosis not present

## 2020-02-06 DIAGNOSIS — Z8371 Family history of colonic polyps: Secondary | ICD-10-CM | POA: Diagnosis not present

## 2020-02-14 ENCOUNTER — Emergency Department (HOSPITAL_COMMUNITY): Payer: Medicare Other

## 2020-02-14 ENCOUNTER — Other Ambulatory Visit: Payer: Self-pay

## 2020-02-14 ENCOUNTER — Encounter (HOSPITAL_COMMUNITY): Payer: Self-pay | Admitting: Emergency Medicine

## 2020-02-14 ENCOUNTER — Emergency Department (HOSPITAL_COMMUNITY)
Admission: EM | Admit: 2020-02-14 | Discharge: 2020-02-14 | Disposition: A | Discharge status: Home / Self Care | Payer: Medicare Other | Attending: Emergency Medicine | Admitting: Emergency Medicine

## 2020-02-14 DIAGNOSIS — S82002A Unspecified fracture of left patella, initial encounter for closed fracture: Secondary | ICD-10-CM | POA: Diagnosis not present

## 2020-02-14 DIAGNOSIS — Z7722 Contact with and (suspected) exposure to environmental tobacco smoke (acute) (chronic): Secondary | ICD-10-CM | POA: Diagnosis not present

## 2020-02-14 DIAGNOSIS — S0081XA Abrasion of other part of head, initial encounter: Secondary | ICD-10-CM | POA: Diagnosis not present

## 2020-02-14 DIAGNOSIS — S82042A Displaced comminuted fracture of left patella, initial encounter for closed fracture: Secondary | ICD-10-CM | POA: Diagnosis not present

## 2020-02-14 DIAGNOSIS — I10 Essential (primary) hypertension: Secondary | ICD-10-CM | POA: Diagnosis not present

## 2020-02-14 DIAGNOSIS — S82032A Displaced transverse fracture of left patella, initial encounter for closed fracture: Secondary | ICD-10-CM | POA: Insufficient documentation

## 2020-02-14 DIAGNOSIS — M25462 Effusion, left knee: Secondary | ICD-10-CM | POA: Diagnosis not present

## 2020-02-14 DIAGNOSIS — W101XXA Fall (on)(from) sidewalk curb, initial encounter: Secondary | ICD-10-CM | POA: Insufficient documentation

## 2020-02-14 DIAGNOSIS — T07XXXA Unspecified multiple injuries, initial encounter: Secondary | ICD-10-CM | POA: Diagnosis present

## 2020-02-14 NOTE — ED Provider Notes (Signed)
Alyssa Robinson   CSN: [id number] Arrival date & time: 02/14/20  1153     History Chief Complaint  Patient presents with   Lytle Michaels    Alyssa Robinson is a 69 y.o. female.  Patient presents to the emergency department today for evaluation of injuries sustained after a fall today.  Patient was walking on uneven ground when she fell onto her knees and scraped her chin.  She did not lose consciousness.  She complains of pain is worse in her left knee and inability to flex her knee.  No headache, neck pain, vomiting or confusion.  She is not anticoagulated.        Past Medical History:  Diagnosis Date   Anemia    hx   Bronchitis    Bronchitis    hx   Diverticulosis    GERD (gastroesophageal reflux disease)    occ   Headache(784.0)    migraines occ   Hypertension    no rx for 2 yrs   Seasonal allergies     Patient Active Problem List   Diagnosis Date Noted   Dizziness 10/14/2019   Lipoma of abdominal wall 08/04/2019   Osteopenia 01/28/2019   Allergic rhinitis 01/28/2019   History of shingles 01/28/2019   Thrombocytopenia ([place]) 10/17/2017   Hyperlipidemia with target LDL less than 130 09/14/2015   Vaginal atrophy 06/01/2015   Cyst of pancreas 03/06/2015   Routine general medical examination at a health care facility 12/11/2012   Essential hypertension, benign 12/11/2012   Constipation 10/17/2012    Past Surgical History:  Procedure Laterality Date   ABDOMINAL HYSTERECTOMY     69 y/o   APPENDECTOMY  03/17/2013   Procedure: INCIDENTAL APPENDECTOMY;  Surgeon: Gwenyth Ober, MD;  Location: [facility];  Service: General;;   capsule endoscopy     COLONOSCOPY     in 2012 -    COLOSTOMY CLOSURE  03/17/2013   COLOSTOMY REVERSAL  03/17/2013   Procedure: COLOSTOMY REVERSAL;  Surgeon: Gwenyth Ober, MD;  Location: [facility];  Service: General;;   EUS N/A 04/30/2015   Procedure: UPPER ENDOSCOPIC ULTRASOUND  (EUS) LINEAR;  Surgeon: Carol Ada, MD;  Location: [facility];  Service: Endoscopy;  Laterality: N/A;   HERNIA REPAIR     X2 2-4 y/o inguinal   LAPAROTOMY N/A 10/17/2012   Procedure: EXPLORATORY LAPAROTOMY,  COLON RESECTION AND COLOSTOMY;  Surgeon: Gwenyth Ober, MD;  Location: [facility];  Service: General;  Laterality: N/A;   LYSIS OF ADHESION  03/17/2013   Procedure: LYSIS OF ADHESION;  Surgeon: Gwenyth Ober, MD;  Location: [facility];  Service: General;;   UPPER GASTROINTESTINAL ENDOSCOPY     2013 -     OB History   No obstetric history on file.     Family History  Problem Relation Age of Onset   Esophageal cancer Mother        and ovarian cancer   Alcohol abuse Mother    Arthritis Mother    Heart disease Father    Alcohol abuse Father    Hypertension Father    Diabetes Father    Early death Father    Colon cancer Other    Pancreatic cancer Other    Stroke Neg Hx    Hyperlipidemia Neg Hx    Kidney disease Neg Hx    COPD Neg Hx     Social History   Tobacco Use   Smoking status: Never Smoker  Smokeless tobacco: Never Used   Tobacco comment: EXPOSED TO SECOND HAND SMOKE   Vaping Use   Vaping Use: Never used  Substance Use Topics   Alcohol use: Yes    Alcohol/week: 2.0 standard drinks    Types: 2 Glasses of wine per week    Comment: occasional   Drug use: No    Home Medications Prior to Admission medications   Medication Sig Start Date End Date Taking? Authorizing Provider  ciclopirox (LOPROX) 0.77 % SUSP Apply topically 2 (two) times daily.    [provider]  Docusate Sodium (COLACE PO) Take by mouth.    [provider]  fexofenadine (ALLEGRA) 180 MG tablet Take 180 mg by mouth daily as needed for allergies.     [provider]  Pediatric Multiple Vit-C-FA (PEDIATRIC MULTIVITAMIN) chewable tablet Chew by mouth.    [provider]  Probiotic Product ([place]) 170 MG CAPS Take by mouth.     [provider]  sodium chloride (OCEAN) 0.65 % SOLN nasal spray Place 1 spray into both nostrils as needed for congestion.    [provider]    Allergies    Caffeine and Sodium lauryl sulfate  Review of Systems   Review of Systems  Constitutional: Negative for activity change.  Musculoskeletal: Positive for arthralgias and joint swelling. Negative for back pain and neck pain.  Skin: Positive for wound (abrasions).  Neurological: Negative for weakness and numbness.    Physical Exam Updated Vital Signs BP 140/90    Pulse 89    Temp 97.9 F (36.6 C) (Oral)    Resp 16    SpO2 99%   Physical Exam Vitals and nursing Robinson reviewed.  Constitutional:      Appearance: She is well-developed.  HENT:     Head: Normocephalic.     Comments: Patient with minor abrasion to the chin.  Patient has a small crack in tooth #8.  Associated pain.  No point tenderness over the mandible or maxilla.  Patient with full range of motion of the jaw in all directions. Eyes:     Pupils: Pupils are equal, round, and reactive to light.  Cardiovascular:     Pulses: Normal pulses. No decreased pulses.  Musculoskeletal:        General: Tenderness present.     Cervical back: Normal range of motion and neck supple.     Comments: Left knee: Swollen with palpable deformity of the patella.  Decreased range of motion.  Associated abrasion.  No deep laceration.  Right knee: Normal range of motion, overlying abrasion.  No deformity.  Skin:    General: Skin is warm and dry.  Neurological:     Mental Status: She is alert.     Sensory: No sensory deficit.     Comments: Motor, sensation, and vascular distal to the injury is fully intact.      ED Results / Procedures / Treatments   Labs (all labs ordered are listed, but only abnormal results are displayed) Labs Reviewed - No data to display  EKG None  Radiology DG Knee Complete 4 Views Left  Result Date: 02/14/2020 CLINICAL DATA:  Acute  LEFT knee pain following fall today. Initial encounter. EXAM: LEFT KNEE - COMPLETE 4+ VIEW COMPARISON:  None. FINDINGS: A minimally comminuted transverse fracture of the mid patella is noted with 5 mm distraction. A joint effusion and anterior soft tissue swelling is noted. No other fracture, subluxation or dislocation is identified. IMPRESSION: Minimally comminuted  transverse fracture of the mid patella with 5 mm distraction. Electronically Signed   By: Margarette Canada M.D.   On: 02/14/2020 12:47    Procedures Procedures (including critical care time)  Medications Ordered in ED Medications - No data to display  ED Course  I have reviewed the triage vital signs and the nursing notes.  Pertinent labs & imaging results that were available during my care of the patient were reviewed by me and considered in my medical decision making (see chart for details).  Patient seen and examined.  Patient with left patella fracture.  Will touch base with orthopedics for recommendations.  Vital signs reviewed and are as follows: BP 140/90    Pulse 89    Temp 97.9 F (36.6 C) (Oral)    Resp 16    SpO2 99%   4:05 PM Patient discussed with and seen earlier with Dr. Rex Kras.   I discussed case with Dr. Lorin Mercy of orthopedic surgery.  Advises knee immobilizer, crutches/walker. Call office Monday, likely appt Tuesday.   Pt updated.   She has done well with the knee immobilizer and is ambulated.  She will have her husband to help her at home.  She would only like Tylenol for pain.  Counseled on RICE protocol, use of knee immobilizer.       MDM Rules/Calculators/A&P                          Patient with closed left patellar fracture.  Plan as above.  Lower extremity is neurovascularly intact.   Final Clinical Impression(s) / ED Diagnoses Final diagnoses:  Closed displaced transverse fracture of left patella, initial encounter    Rx / DC Orders ED Discharge Orders    None       Carlisle Cater,  PA-C 02/14/20 [place], Wenda Overland, MD 02/18/20 1627

## 2020-02-14 NOTE — Discharge Instructions (Signed)
Please read and follow all provided instructions.  Your diagnoses today include:  1. Closed displaced transverse fracture of left patella, initial encounter     Tests performed today include:  An x-ray of the affected area - x-ray of your right knee shows a left sided kneecap fracture  Vital signs. See below for your results today.   Medications prescribed:   None  Take any prescribed medications only as directed.  Home care instructions:   Follow any educational materials contained in this packet  Follow R.I.C.E. Protocol:  R - rest your injury   I  - use ice on injury without applying directly to skin  C - compress injury with bandage or splint  E - elevate the injury as much as possible  Follow-up instructions: Please follow-up with an orthopedic physician (bone specialist) next week.   Of note, Dr. Rhona Raider is with [facility] [phone]) and Dr. Alvan Dame is with [facility].   Return instructions:   Please return if your toes or feet are numb or tingling, appear gray or blue, or you have severe pain (also elevate the leg and loosen splint or wrap if you were given one)  Please return to the Emergency Department if you experience worsening symptoms.   Please return if you have any other emergent concerns.  Additional Information:  Your vital signs today were: BP (!) 159/87   Pulse 64   Temp 97.9 F (36.6 C) (Oral)   Resp 16   SpO2 95%  If your blood pressure (BP) was elevated above 135/85 this visit, please have this repeated by your doctor within one month. -------------- If prescribed crutches for your injury: use crutches with non-weight bearing for the first few days. Then, you may walk as the pain allows, or as instructed. Start gradually with weight bearing on the affected side. Once you can walk pain free, then try jogging. When you can run forwards, then you can try moving side-to-side. If you cannot walk without crutches in one week, you  need a re-check. --------------

## 2020-02-14 NOTE — ED Triage Notes (Signed)
Patient reports trip and fall on sidewalk this morning. C/o left knee pain and reports hitting chin. Denies LOC.

## 2020-02-15 ENCOUNTER — Telehealth (HOSPITAL_COMMUNITY): Payer: Self-pay | Admitting: Emergency Medicine

## 2020-02-15 ENCOUNTER — Emergency Department (HOSPITAL_COMMUNITY)
Admission: EM | Admit: 2020-02-15 | Discharge: 2020-02-15 | Disposition: A | Discharge status: Home / Self Care | Payer: Medicare Other | Attending: Emergency Medicine | Admitting: Emergency Medicine

## 2020-02-15 DIAGNOSIS — M79606 Pain in leg, unspecified: Secondary | ICD-10-CM | POA: Diagnosis not present

## 2020-02-15 DIAGNOSIS — Z5321 Procedure and treatment not carried out due to patient leaving prior to being seen by health care provider: Secondary | ICD-10-CM | POA: Insufficient documentation

## 2020-02-15 MED ORDER — OXYCODONE HCL 5 MG PO TABS: 2.5000 mg | ORAL_TABLET | Freq: Four times a day (QID) | ORAL | 0 refills | Status: DC | PRN

## 2020-02-15 NOTE — Telephone Encounter (Signed)
Patient seen yesterday by myself -- L closed patellar fracture. Yesterday she just wanted to use Tylenol for pain. Pain currently uncontrolled with Tylenol. She is here requesting additional pain medication, which is appropriate.

## 2020-02-15 NOTE — Progress Notes (Deleted)
Subjective:    Patient ID: Alyssa Robinson, female    DOB: [birth year], 69 y.o.   MRN: [id number]  HPI The patient is here for an acute visit.   Right hip, thigh pain -      Medications and allergies reviewed with patient and updated if appropriate.  Patient Active Problem List   Diagnosis Date Noted  . Dizziness 10/14/2019  . Lipoma of abdominal wall 08/04/2019  . Osteopenia 01/28/2019  . Allergic rhinitis 01/28/2019  . History of shingles 01/28/2019  . Thrombocytopenia ([place]) 10/17/2017  . Hyperlipidemia with target LDL less than 130 09/14/2015  . Vaginal atrophy 06/01/2015  . 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 Routine general medical examination at a health care facility 12/11/2012  . Essential hypertension, benign 12/11/2012  . Constipation 10/17/2012    Current Outpatient Medications on File Prior to Visit  Medication Sig Dispense Refill  . ciclopirox (LOPROX) 0.77 % SUSP Apply topically 2 (two) times daily.    Mariane Baumgarten Sodium (COLACE PO) Take by mouth.    . fexofenadine (ALLEGRA) 180 MG tablet Take 180 mg by mouth daily as needed for allergies.     Marland Kitchen oxyCODONE (OXY IR/ROXICODONE) 5 MG immediate release tablet Take 0.5-1 tablets (2.5-5 mg total) by mouth every 6 (six) hours as needed for severe pain. 10 tablet 0  . Pediatric Multiple Vit-C-FA (PEDIATRIC MULTIVITAMIN) chewable tablet Chew by mouth.    . Probiotic Product ([place]) 170 MG CAPS Take by mouth.    . sodium chloride (OCEAN) 0.65 % SOLN nasal spray Place 1 spray into both nostrils as needed for congestion.     No current facility-administered medications on file prior to visit.    Past Medical History:  Diagnosis Date  . Anemia    hx  . Bronchitis   . Bronchitis    hx  . Diverticulosis   . GERD (gastroesophageal reflux disease)    occ  . Headache(784.0)    migraines occ  . Hypertension    no rx for 2 yrs  . Seasonal allergies     Past Surgical History:  Procedure Laterality  Date  . ABDOMINAL HYSTERECTOMY     69 y/o  . APPENDECTOMY  03/17/2013   Procedure: INCIDENTAL APPENDECTOMY;  Surgeon: Gwenyth Ober, MD;  Location: [facility];  Service: General;;  . capsule endoscopy    . COLONOSCOPY     in 2012 -   . COLOSTOMY CLOSURE  03/17/2013  . COLOSTOMY REVERSAL  03/17/2013   Procedure: COLOSTOMY REVERSAL;  Surgeon: Gwenyth Ober, MD;  Location: [facility];  Service: General;;  . EUS N/A 04/30/2015   Procedure: UPPER ENDOSCOPIC ULTRASOUND (EUS) LINEAR;  Surgeon: Carol Ada, MD;  Location: [facility];  Service: Endoscopy;  Laterality: N/A;  . HERNIA REPAIR     X2 2-4 y/o inguinal  . LAPAROTOMY N/A 10/17/2012   Procedure: EXPLORATORY LAPAROTOMY,  COLON RESECTION AND COLOSTOMY;  Surgeon: Gwenyth Ober, MD;  Location: [facility];  Service: General;  Laterality: N/A;  . LYSIS OF ADHESION  03/17/2013   Procedure: LYSIS OF ADHESION;  Surgeon: Gwenyth Ober, MD;  Location: [facility];  Service: General;;  . UPPER GASTROINTESTINAL ENDOSCOPY     2013 -    Social History   Socioeconomic History  . Marital status: Married    Spouse name: Not on file  . Number of children: Not on file  . Years of education: Not on file  . Highest education level:  Not on file  Occupational History  . Not on file  Tobacco Use  . Smoking status: Never Smoker  . Smokeless tobacco: Never Used  . Tobacco comment: EXPOSED TO SECOND HAND SMOKE   Vaping Use  . Vaping Use: Never used  Substance and Sexual Activity  . Alcohol use: Yes    Alcohol/week: 2.0 standard drinks    Types: 2 Glasses of wine per week    Comment: occasional  . Drug use: No  . Sexual activity: Not Currently  Other Topics Concern  . Not on file  Social History Narrative  . Not on file   Social Determinants of Health   Financial Resource Strain:   . Difficulty of Paying Living Expenses: Not on file  Food Insecurity:   . Worried About Charity fundraiser in the Last Year: Not on file  . Ran Out of Food in the Last Year: Not  on file  Transportation Needs:   . Lack of Transportation (Medical): Not on file  . Lack of Transportation (Non-Medical): Not on file  Physical Activity:   . Days of Exercise per Week: Not on file  . Minutes of Exercise per Session: Not on file  Stress:   . Feeling of Stress : Not on file  Social Connections:   . Frequency of Communication with Friends and Family: Not on file  . Frequency of Social Gatherings with Friends and Family: Not on file  . Attends Religious Services: Not on file  . Active Member of Clubs or Organizations: Not on file  . Attends Archivist Meetings: Not on file  . Marital Status: Not on file    Family History  Problem Relation Age of Onset  . Esophageal cancer Mother        and ovarian cancer  . Alcohol abuse Mother   . Arthritis Mother   . Heart disease Father   . Alcohol abuse Father   . Hypertension Father   . Diabetes Father   . Early death Father   . Colon cancer Other   . Pancreatic cancer Other   . Stroke Neg Hx   . Hyperlipidemia Neg Hx   . Kidney disease Neg Hx   . COPD Neg Hx     Review of Systems     Objective:  There were no vitals filed for this visit. BP Readings from Last 3 Encounters:  02/14/20 (!) 157/76  10/14/19 124/80  08/04/19 (!) 142/80   Wt Readings from Last 3 Encounters:  10/14/19 111 lb (50.3 kg)  08/04/19 113 lb 12.8 oz (51.6 kg)  01/28/19 113 lb (51.3 kg)   There is no height or weight on file to calculate BMI.   Physical Exam         Assessment & Plan:    See Problem List for Assessment and Plan of chronic medical problems.    This visit occurred during the SARS-CoV-2 public health emergency.  Safety protocols were in place, including screening questions prior to the visit, additional usage of staff PPE, and extensive cleaning of exam room while observing appropriate contact time as indicated for disinfecting solutions.

## 2020-02-16 ENCOUNTER — Ambulatory Visit: Payer: Medicare Other | Admitting: Internal Medicine

## 2020-02-17 ENCOUNTER — Encounter (HOSPITAL_BASED_OUTPATIENT_CLINIC_OR_DEPARTMENT_OTHER): Payer: Self-pay | Admitting: Orthopaedic Surgery

## 2020-02-17 ENCOUNTER — Ambulatory Visit (INDEPENDENT_AMBULATORY_CARE_PROVIDER_SITE_OTHER): Payer: Medicare Other | Admitting: Orthopaedic Surgery

## 2020-02-17 ENCOUNTER — Other Ambulatory Visit: Payer: Self-pay

## 2020-02-17 ENCOUNTER — Other Ambulatory Visit (HOSPITAL_COMMUNITY)
Admission: RE | Admit: 2020-02-17 | Discharge: 2020-02-17 | Disposition: A | Discharge status: Home / Self Care | Payer: Medicare Other | Source: Ambulatory Visit | Attending: Orthopaedic Surgery | Admitting: Orthopaedic Surgery

## 2020-02-17 ENCOUNTER — Encounter: Payer: Self-pay | Admitting: Orthopaedic Surgery

## 2020-02-17 VITALS — Ht 59.0 in | Wt 110.0 lb

## 2020-02-17 DIAGNOSIS — S82032A Displaced transverse fracture of left patella, initial encounter for closed fracture: Secondary | ICD-10-CM | POA: Diagnosis not present

## 2020-02-17 DIAGNOSIS — Z01812 Encounter for preprocedural laboratory examination: Secondary | ICD-10-CM | POA: Diagnosis not present

## 2020-02-17 DIAGNOSIS — Z20822 Contact with and (suspected) exposure to covid-19: Secondary | ICD-10-CM | POA: Diagnosis not present

## 2020-02-17 LAB — SARS CORONAVIRUS 2 (TAT 6-24 HRS): SARS Coronavirus 2: NEGATIVE

## 2020-02-17 NOTE — Progress Notes (Signed)
Office Visit Note   Patient: Alyssa Robinson           Date of Birth: 24-May-1950           MRN: [id number] Visit Date: 02/17/2020              Requested by: Hoyt Koch, MD [address] [place],  [place] [address] PCP: Hoyt Koch, MD   Assessment & Plan: Visit Diagnoses:  1. Closed displaced transverse fracture of left patella, initial encounter     Plan: Impression is comminuted left patella fracture with distraction of the main fracture fragments.  Patient is extremely active and would like to return to activity as soon as possible.  Given the x-ray findings of the displaced and comminuted and distracted fracture with articular incongruity I have recommended surgical repair to allow for anatomic healing and earlier mobilization and return to activities.  Risk benefits rehab recovery reviewed with the patient in detail.  We will send her to [facility] to get a T ROM brace.  Questions encouraged and answered.  Total face to face encounter time was greater than 45 minutes and over half of this time was spent in counseling and/or coordination of care.  Follow-Up Instructions: Return for 2-week postop visit.   Orders:  No orders of the defined types were placed in this encounter.  No orders of the defined types were placed in this encounter.     Procedures: No procedures performed   Clinical Data: No additional findings.   Subjective: Chief Complaint  Patient presents with  . Left Knee - Pain, Injury    DOI 02/14/2020    Alyssa Robinson is a very pleasant 69 year old female ER follow-up from 3 days ago.  Mechanical fall onto the left knee while walking and tripped on uneven concrete.  She is very active and still plays golf and walks about 4 miles a day.  Denies any other injuries.  She was recommended to [place] by Andris Flurry as well as some personal friends.   Review of Systems  Constitutional: Negative.   HENT: Negative.   Eyes: Negative.   Respiratory:  Negative.   Cardiovascular: Negative.   Endocrine: Negative.   Musculoskeletal: Negative.   Neurological: Negative.   Hematological: Negative.   Psychiatric/Behavioral: Negative.   All other systems reviewed and are negative.    Objective: Vital Signs: Ht 4\' 11"  (1.499 m)   Wt 110 lb (49.9 kg)   BMI 22.22 kg/m   Physical Exam Vitals and nursing note reviewed.  Constitutional:      Appearance: She is well-developed.  HENT:     Head: Normocephalic and atraumatic.  Pulmonary:     Effort: Pulmonary effort is normal.  Abdominal:     Palpations: Abdomen is soft.  Musculoskeletal:     Cervical back: Neck supple.  Skin:    General: Skin is warm.     Capillary Refill: Capillary refill takes less than 2 seconds.  Neurological:     Mental Status: She is alert and oriented to person, place, and time.  Psychiatric:        Behavior: Behavior normal.        Thought Content: Thought content normal.        Judgment: Judgment normal.     Ortho Exam Left knee shows moderate swelling.  A couple of superficial abrasions that are hemostatic.  No signs of infection or cellulitis.  Unable to extend the leg against gravity.  Neurovascular intact distally.  Specialty Comments:  No specialty comments available.  Imaging: No results found.   PMFS History: Patient Active Problem List   Diagnosis Date Noted  . Dizziness 10/14/2019  . Lipoma of abdominal wall 08/04/2019  . Osteopenia 01/28/2019  . Allergic rhinitis 01/28/2019  . History of shingles 01/28/2019  . Thrombocytopenia ([place]) 10/17/2017  . Hyperlipidemia with target LDL less than 130 09/14/2015  . Vaginal atrophy 06/01/2015  . 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 Routine general medical examination at a health care facility 12/11/2012  . Essential hypertension, benign 12/11/2012  . Constipation 10/17/2012   Past Medical History:  Diagnosis Date  . Anemia    hx  . Bronchitis   . Bronchitis    hx  . Diverticulosis   .  GERD (gastroesophageal reflux disease)    occ  . Headache(784.0)    migraines occ  . Hypertension    no rx for 2 yrs  . Seasonal allergies     Family History  Problem Relation Age of Onset  . Esophageal cancer Mother        and ovarian cancer  . Alcohol abuse Mother   . Arthritis Mother   . Heart disease Father   . Alcohol abuse Father   . Hypertension Father   . Diabetes Father   . Early death Father   . Colon cancer Other   . Pancreatic cancer Other   . Stroke Neg Hx   . Hyperlipidemia Neg Hx   . Kidney disease Neg Hx   . COPD Neg Hx     Past Surgical History:  Procedure Laterality Date  . ABDOMINAL HYSTERECTOMY     69 y/o  . APPENDECTOMY  03/17/2013   Procedure: INCIDENTAL APPENDECTOMY;  Surgeon: Gwenyth Ober, MD;  Location: [facility];  Service: General;;  . capsule endoscopy    . COLONOSCOPY     in 2012 -   . COLOSTOMY CLOSURE  03/17/2013  . COLOSTOMY REVERSAL  03/17/2013   Procedure: COLOSTOMY REVERSAL;  Surgeon: Gwenyth Ober, MD;  Location: [facility];  Service: General;;  . EUS N/A 04/30/2015   Procedure: UPPER ENDOSCOPIC ULTRASOUND (EUS) LINEAR;  Surgeon: Carol Ada, MD;  Location: [facility];  Service: Endoscopy;  Laterality: N/A;  . HERNIA REPAIR     X2 2-4 y/o inguinal  . LAPAROTOMY N/A 10/17/2012   Procedure: EXPLORATORY LAPAROTOMY,  COLON RESECTION AND COLOSTOMY;  Surgeon: Gwenyth Ober, MD;  Location: [facility];  Service: General;  Laterality: N/A;  . LYSIS OF ADHESION  03/17/2013   Procedure: LYSIS OF ADHESION;  Surgeon: Gwenyth Ober, MD;  Location: [facility];  Service: General;;  . UPPER GASTROINTESTINAL ENDOSCOPY     2013 -   Social History   Occupational History  . Not on file  Tobacco Use  . Smoking status: Never Smoker  . Smokeless tobacco: Never Used  . Tobacco comment: EXPOSED TO SECOND HAND SMOKE   Vaping Use  . Vaping Use: Never used  Substance and Sexual Activity  . Alcohol use: Yes    Alcohol/week: 2.0 standard drinks    Types: 2 Glasses of  wine per week    Comment: occasional  . Drug use: No  . Sexual activity: Not Currently

## 2020-02-20 ENCOUNTER — Ambulatory Visit (HOSPITAL_COMMUNITY): Payer: Medicare Other

## 2020-02-20 ENCOUNTER — Ambulatory Visit (HOSPITAL_BASED_OUTPATIENT_CLINIC_OR_DEPARTMENT_OTHER): Payer: Medicare Other | Admitting: Anesthesiology

## 2020-02-20 ENCOUNTER — Encounter (HOSPITAL_BASED_OUTPATIENT_CLINIC_OR_DEPARTMENT_OTHER)
Admission: RE | Disposition: A | Discharge status: Home / Self Care | Payer: Self-pay | Source: Home / Self Care | Attending: Orthopaedic Surgery

## 2020-02-20 ENCOUNTER — Encounter (HOSPITAL_BASED_OUTPATIENT_CLINIC_OR_DEPARTMENT_OTHER): Payer: Self-pay | Admitting: Orthopaedic Surgery

## 2020-02-20 ENCOUNTER — Telehealth: Payer: Self-pay

## 2020-02-20 ENCOUNTER — Other Ambulatory Visit: Payer: Self-pay

## 2020-02-20 ENCOUNTER — Ambulatory Visit (HOSPITAL_BASED_OUTPATIENT_CLINIC_OR_DEPARTMENT_OTHER)
Admission: RE | Admit: 2020-02-20 | Discharge: 2020-02-20 | Disposition: A | Discharge status: Home / Self Care | Payer: Medicare Other | Attending: Orthopaedic Surgery | Admitting: Orthopaedic Surgery

## 2020-02-20 DIAGNOSIS — Z419 Encounter for procedure for purposes other than remedying health state, unspecified: Secondary | ICD-10-CM

## 2020-02-20 DIAGNOSIS — G8918 Other acute postprocedural pain: Secondary | ICD-10-CM | POA: Diagnosis not present

## 2020-02-20 DIAGNOSIS — E785 Hyperlipidemia, unspecified: Secondary | ICD-10-CM | POA: Diagnosis not present

## 2020-02-20 DIAGNOSIS — S82042A Displaced comminuted fracture of left patella, initial encounter for closed fracture: Secondary | ICD-10-CM

## 2020-02-20 DIAGNOSIS — Z7722 Contact with and (suspected) exposure to environmental tobacco smoke (acute) (chronic): Secondary | ICD-10-CM | POA: Diagnosis not present

## 2020-02-20 DIAGNOSIS — D696 Thrombocytopenia, unspecified: Secondary | ICD-10-CM | POA: Diagnosis not present

## 2020-02-20 DIAGNOSIS — S82002A Unspecified fracture of left patella, initial encounter for closed fracture: Secondary | ICD-10-CM | POA: Diagnosis not present

## 2020-02-20 DIAGNOSIS — X58XXXA Exposure to other specified factors, initial encounter: Secondary | ICD-10-CM | POA: Insufficient documentation

## 2020-02-20 DIAGNOSIS — I10 Essential (primary) hypertension: Secondary | ICD-10-CM | POA: Diagnosis not present

## 2020-02-20 HISTORY — PX: ORIF PATELLA: SHX5033

## 2020-02-20 HISTORY — DX: Other fracture of left patella, initial encounter for closed fracture: S82.092A

## 2020-02-20 SURGERY — OPEN REDUCTION INTERNAL FIXATION (ORIF) PATELLA
Anesthesia: Regional | Site: Knee | Laterality: Left

## 2020-02-20 MED ORDER — VANCOMYCIN HCL 500 MG IV SOLR
INTRAVENOUS | Status: AC
  Filled 2020-02-20: qty 500

## 2020-02-20 MED ORDER — FENTANYL CITRATE (PF) 100 MCG/2ML IJ SOLN
INTRAMUSCULAR | Status: AC
  Filled 2020-02-20: qty 2

## 2020-02-20 MED ORDER — OXYCODONE-ACETAMINOPHEN 5-325 MG PO TABS
1.0000 | ORAL_TABLET | Freq: Three times a day (TID) | ORAL | 0 refills | Status: DC | PRN
Start: 2020-02-20 — End: 2020-04-06

## 2020-02-20 MED ORDER — CHLORHEXIDINE GLUCONATE 4 % EX LIQD: 60.0000 mL | Freq: Once | CUTANEOUS | Status: DC

## 2020-02-20 MED ORDER — LACTATED RINGERS IV SOLN: INTRAVENOUS | Status: DC

## 2020-02-20 MED ORDER — ROPIVACAINE HCL 5 MG/ML IJ SOLN
INTRAMUSCULAR | Status: DC | PRN
  Administered 2020-02-20: 30 mL via PERINEURAL

## 2020-02-20 MED ORDER — LIDOCAINE HCL 1 % IJ SOLN
INTRAMUSCULAR | Status: DC | PRN
  Administered 2020-02-20: 60 mg via INTRADERMAL

## 2020-02-20 MED ORDER — CEFAZOLIN SODIUM-DEXTROSE 2-4 GM/100ML-% IV SOLN
INTRAVENOUS | Status: AC
  Filled 2020-02-20: qty 100

## 2020-02-20 MED ORDER — MIDAZOLAM HCL 2 MG/2ML IJ SOLN
INTRAMUSCULAR | Status: AC
  Filled 2020-02-20: qty 2

## 2020-02-20 MED ORDER — CLONIDINE HCL (ANALGESIA) 100 MCG/ML EP SOLN
EPIDURAL | Status: DC | PRN
  Administered 2020-02-20: 100 ug

## 2020-02-20 MED ORDER — FENTANYL CITRATE (PF) 100 MCG/2ML IJ SOLN: 25.0000 ug | INTRAMUSCULAR | Status: DC | PRN

## 2020-02-20 MED ORDER — ONDANSETRON HCL 4 MG/2ML IJ SOLN
INTRAMUSCULAR | Status: DC | PRN
  Administered 2020-02-20: 4 mg via INTRAVENOUS

## 2020-02-20 MED ORDER — EPHEDRINE SULFATE 50 MG/ML IJ SOLN
INTRAMUSCULAR | Status: DC | PRN
  Administered 2020-02-20: 10 mg via INTRAVENOUS

## 2020-02-20 MED ORDER — PROPOFOL 10 MG/ML IV BOLUS
INTRAVENOUS | Status: AC
  Filled 2020-02-20: qty 20

## 2020-02-20 MED ORDER — POVIDONE-IODINE 10 % EX SWAB
2.0000 "application " | Freq: Once | CUTANEOUS | Status: AC
  Administered 2020-02-20: 2 via TOPICAL

## 2020-02-20 MED ORDER — ONDANSETRON 4 MG PO TBDP
4.0000 mg | ORAL_TABLET | Freq: Once | ORAL | Status: AC
  Administered 2020-02-20: 4 mg via ORAL

## 2020-02-20 MED ORDER — DEXAMETHASONE SODIUM PHOSPHATE 4 MG/ML IJ SOLN
INTRAMUSCULAR | Status: DC | PRN
  Administered 2020-02-20: 4 mg via INTRAVENOUS

## 2020-02-20 MED ORDER — DEXAMETHASONE SODIUM PHOSPHATE 10 MG/ML IJ SOLN
INTRAMUSCULAR | Status: AC
  Filled 2020-02-20: qty 1

## 2020-02-20 MED ORDER — CEFAZOLIN SODIUM-DEXTROSE 2-4 GM/100ML-% IV SOLN
2.0000 g | INTRAVENOUS | Status: AC
  Administered 2020-02-20: 2 g via INTRAVENOUS

## 2020-02-20 MED ORDER — PROPOFOL 10 MG/ML IV BOLUS
INTRAVENOUS | Status: DC | PRN
  Administered 2020-02-20: 100 mg via INTRAVENOUS

## 2020-02-20 MED ORDER — ONDANSETRON HCL 4 MG/2ML IJ SOLN: 4.0000 mg | Freq: Once | INTRAMUSCULAR | Status: DC | PRN

## 2020-02-20 MED ORDER — ONDANSETRON 4 MG PO TBDP
ORAL_TABLET | ORAL | Status: AC
  Filled 2020-02-20: qty 1

## 2020-02-20 MED ORDER — LIDOCAINE 2% (20 MG/ML) 5 ML SYRINGE
INTRAMUSCULAR | Status: AC
  Filled 2020-02-20: qty 5

## 2020-02-20 MED ORDER — EPHEDRINE 5 MG/ML INJ
INTRAVENOUS | Status: AC
  Filled 2020-02-20: qty 10

## 2020-02-20 MED ORDER — FENTANYL CITRATE (PF) 100 MCG/2ML IJ SOLN
INTRAMUSCULAR | Status: DC | PRN
  Administered 2020-02-20 (×3): 25 ug via INTRAVENOUS
  Administered 2020-02-20: 50 ug via INTRAVENOUS

## 2020-02-20 MED ORDER — MIDAZOLAM HCL 2 MG/2ML IJ SOLN
2.0000 mg | Freq: Once | INTRAMUSCULAR | Status: AC
  Administered 2020-02-20: 1 mg via INTRAVENOUS

## 2020-02-20 MED ORDER — ACETAMINOPHEN 500 MG PO TABS
1000.0000 mg | ORAL_TABLET | Freq: Once | ORAL | Status: AC
  Administered 2020-02-20: 1000 mg via ORAL

## 2020-02-20 MED ORDER — ONDANSETRON HCL 4 MG/2ML IJ SOLN
INTRAMUSCULAR | Status: AC
  Filled 2020-02-20: qty 2

## 2020-02-20 MED ORDER — ACETAMINOPHEN 500 MG PO TABS
ORAL_TABLET | ORAL | Status: AC
  Filled 2020-02-20: qty 2

## 2020-02-20 MED ORDER — VANCOMYCIN HCL 500 MG IV SOLR
INTRAVENOUS | Status: DC | PRN
  Administered 2020-02-20: 500 mg via TOPICAL

## 2020-02-20 MED ORDER — CALCIUM CARBONATE-VITAMIN D 500-200 MG-UNIT PO TABS: 1.0000 | ORAL_TABLET | Freq: Three times a day (TID) | ORAL | 6 refills | Status: DC

## 2020-02-20 SURGICAL SUPPLY — 108 items
BANDAGE ESMARK 6X9 LF (GAUZE/BANDAGES/DRESSINGS) ×1 IMPLANT
BIT DRILL 2.6 CANN (BIT) ×3 IMPLANT
BIT DRILL CANN F/COMP 2.2 (BIT) ×3 IMPLANT
BLADE HEX COATED 2.75 (ELECTRODE) IMPLANT
BLADE SURG 10 STRL SS (BLADE) ×3 IMPLANT
BLADE SURG 15 STRL LF DISP TIS (BLADE) ×2 IMPLANT
BLADE SURG 15 STRL SS (BLADE) ×6
BNDG CMPR 9X6 STRL LF SNTH (GAUZE/BANDAGES/DRESSINGS) ×1
BNDG COHESIVE 6X5 TAN STRL LF (GAUZE/BANDAGES/DRESSINGS) ×3 IMPLANT
BNDG ELASTIC 4X5.8 VLCR STR LF (GAUZE/BANDAGES/DRESSINGS) IMPLANT
BNDG ELASTIC 6X5.8 VLCR STR LF (GAUZE/BANDAGES/DRESSINGS) ×3 IMPLANT
BNDG ESMARK 6X9 LF (GAUZE/BANDAGES/DRESSINGS) ×3
CANISTER SUCT 1200ML W/VALVE (MISCELLANEOUS) IMPLANT
CLOSURE WOUND 1/2 X4 (GAUZE/BANDAGES/DRESSINGS)
COVER BACK TABLE 60X90IN (DRAPES) ×3 IMPLANT
COVER MAYO STAND STRL (DRAPES) ×3 IMPLANT
COVER WAND RF STERILE (DRAPES) IMPLANT
CUFF TOURN SGL QUICK 24 (TOURNIQUET CUFF) ×3
CUFF TOURN SGL QUICK 34 (TOURNIQUET CUFF)
CUFF TRNQT CYL 24X4X16.5-23 (TOURNIQUET CUFF) ×1 IMPLANT
CUFF TRNQT CYL 34X4.125X (TOURNIQUET CUFF) IMPLANT
DECANTER SPIKE VIAL GLASS SM (MISCELLANEOUS) IMPLANT
DRAPE C-ARM 42X72 X-RAY (DRAPES) ×3 IMPLANT
DRAPE C-ARMOR (DRAPES) ×3 IMPLANT
DRAPE EXTREMITY T 121X128X90 (DISPOSABLE) ×3 IMPLANT
DRAPE IMP U-DRAPE 54X76 (DRAPES) ×3 IMPLANT
DRAPE INCISE IOBAN 66X45 STRL (DRAPES) ×3 IMPLANT
DRAPE U-SHAPE 47X51 STRL (DRAPES) IMPLANT
DURAPREP 26ML APPLICATOR (WOUND CARE) ×9 IMPLANT
ELECT REM PT RETURN 9FT ADLT (ELECTROSURGICAL) ×3
ELECTRODE REM PT RTRN 9FT ADLT (ELECTROSURGICAL) ×1 IMPLANT
GAUZE 4X4 16PLY RFD (DISPOSABLE) IMPLANT
GAUZE SPONGE 4X4 12PLY STRL (GAUZE/BANDAGES/DRESSINGS) ×3 IMPLANT
GAUZE XEROFORM 1X8 LF (GAUZE/BANDAGES/DRESSINGS) ×6 IMPLANT
GLOVE BIOGEL PI IND STRL 6.5 (GLOVE) ×1 IMPLANT
GLOVE BIOGEL PI IND STRL 7.0 (GLOVE) ×1 IMPLANT
GLOVE BIOGEL PI INDICATOR 6.5 (GLOVE) ×2
GLOVE BIOGEL PI INDICATOR 7.0 (GLOVE) ×2
GLOVE ECLIPSE 6.5 STRL STRAW (GLOVE) ×6 IMPLANT
GLOVE ECLIPSE 7.0 STRL STRAW (GLOVE) ×6 IMPLANT
GLOVE SKINSENSE NS SZ7.5 (GLOVE) ×2
GLOVE SKINSENSE STRL SZ7.5 (GLOVE) ×1 IMPLANT
GLOVE SURG SYN 7.5  E (GLOVE) ×9
GLOVE SURG SYN 7.5 E (GLOVE) ×3 IMPLANT
GOWN STRL REIN XL XLG (GOWN DISPOSABLE) ×3 IMPLANT
GOWN STRL REUS W/ TWL LRG LVL3 (GOWN DISPOSABLE) ×1 IMPLANT
GOWN STRL REUS W/ TWL XL LVL3 (GOWN DISPOSABLE) ×1 IMPLANT
GOWN STRL REUS W/TWL LRG LVL3 (GOWN DISPOSABLE) ×3
GOWN STRL REUS W/TWL XL LVL3 (GOWN DISPOSABLE) ×3
GUIDEWIRE .045IN 1.14MM (WIRE) ×3 IMPLANT
GUIDEWIRE .045X6.84IN (WIRE) ×9 IMPLANT
IMMOBILIZER KNEE 22 UNIV (SOFTGOODS) IMPLANT
IMMOBILIZER KNEE 24 THIGH 36 (MISCELLANEOUS) IMPLANT
IMMOBILIZER KNEE 24 UNIV (MISCELLANEOUS)
MANIFOLD NEPTUNE II (INSTRUMENTS) ×3 IMPLANT
NEEDLE HYPO 22GX1.5 SAFETY (NEEDLE) IMPLANT
NS IRRIG 1000ML POUR BTL (IV SOLUTION) ×3 IMPLANT
PACK ARTHROSCOPY DSU (CUSTOM PROCEDURE TRAY) ×3 IMPLANT
PACK BASIN DAY SURGERY FS (CUSTOM PROCEDURE TRAY) ×3 IMPLANT
PAD CAST 3X4 CTTN HI CHSV (CAST SUPPLIES) IMPLANT
PAD CAST 4YDX4 CTTN HI CHSV (CAST SUPPLIES) IMPLANT
PADDING CAST COTTON 3X4 STRL (CAST SUPPLIES)
PADDING CAST COTTON 4X4 STRL (CAST SUPPLIES)
PADDING CAST COTTON 6X4 STRL (CAST SUPPLIES) IMPLANT
PADDING CAST SYN 6 (CAST SUPPLIES)
PADDING CAST SYNTHETIC 4 (CAST SUPPLIES)
PADDING CAST SYNTHETIC 4X4 STR (CAST SUPPLIES) IMPLANT
PADDING CAST SYNTHETIC 6X4 NS (CAST SUPPLIES) IMPLANT
PENCIL SMOKE EVACUATOR (MISCELLANEOUS) ×3 IMPLANT
PLATE PATELLA MEDIUM (Plate) ×3 IMPLANT
SCREW CANN BLUNT TIP 4X38 LP (Screw) ×3 IMPLANT
SCREW LOCKING 3MMX12MM (Screw) ×12 IMPLANT
SCREW LOCKING 3MMX14MM (Screw) ×3 IMPLANT
SCREW LOCKING 3MMX16MM (Screw) ×9 IMPLANT
SHEET MEDIUM DRAPE 40X70 STRL (DRAPES) ×3 IMPLANT
SLEEVE SCD COMPRESS KNEE MED (MISCELLANEOUS) ×3 IMPLANT
SPLINT FIBERGLASS 3X35 (CAST SUPPLIES) IMPLANT
SPLINT FIBERGLASS 4X30 (CAST SUPPLIES) IMPLANT
SPONGE LAP 18X18 RF (DISPOSABLE) IMPLANT
STAPLER VISISTAT (STAPLE) IMPLANT
STOCKINETTE IMPERVIOUS LG (DRAPES) ×3 IMPLANT
STRIP CLOSURE SKIN 1/2X4 (GAUZE/BANDAGES/DRESSINGS) IMPLANT
SUCTION FRAZIER HANDLE 10FR (MISCELLANEOUS) ×3
SUCTION TUBE FRAZIER 10FR DISP (MISCELLANEOUS) ×1 IMPLANT
SUT ETHILON 3 0 PS 1 (SUTURE) ×9 IMPLANT
SUT FIBERWIRE #2 38 T-5 BLUE (SUTURE)
SUT FIBERWIRE #5 38 CONV NDL (SUTURE)
SUT MNCRL AB 3-0 PS2 18 (SUTURE) IMPLANT
SUT VIC AB 0 CT1 18XCR BRD 8 (SUTURE) IMPLANT
SUT VIC AB 0 CT1 27 (SUTURE) ×3
SUT VIC AB 0 CT1 27XCR 8 STRN (SUTURE) ×1 IMPLANT
SUT VIC AB 0 CT1 8-18 (SUTURE)
SUT VIC AB 1 CT1 27 (SUTURE)
SUT VIC AB 1 CT1 27XBRD ANBCTR (SUTURE) IMPLANT
SUT VIC AB 2-0 CT1 27 (SUTURE) ×6
SUT VIC AB 2-0 CT1 TAPERPNT 27 (SUTURE) ×2 IMPLANT
SUT VIC AB 2-0 SH 27 (SUTURE) ×3
SUT VIC AB 2-0 SH 27XBRD (SUTURE) ×1 IMPLANT
SUTURE FIBERWR #2 38 T-5 BLUE (SUTURE) IMPLANT
SUTURE FIBERWR #5 38 CONV NDL (SUTURE) IMPLANT
SYR BULB EAR ULCER 3OZ GRN STR (SYRINGE) ×3 IMPLANT
SYR CONTROL 10ML LL (SYRINGE) IMPLANT
TAK BB THREADED (Anchor) ×3 IMPLANT
TOWEL GREEN STERILE FF (TOWEL DISPOSABLE) ×6 IMPLANT
TUBE CONNECTING 20'X1/4 (TUBING) ×1
TUBE CONNECTING 20X1/4 (TUBING) ×2 IMPLANT
UNDERPAD 30X36 HEAVY ABSORB (UNDERPADS AND DIAPERS) ×3 IMPLANT
YANKAUER SUCT BULB TIP NO VENT (SUCTIONS) ×3 IMPLANT

## 2020-02-20 NOTE — Anesthesia Procedure Notes (Signed)
Procedure Name: LMA Insertion Date/Time: 02/20/2020 12:05 PM Performed by: Garrel Ridgel, CRNA Pre-anesthesia Checklist: Patient identified, Emergency Drugs available, Suction available, Patient being monitored and Timeout performed Patient Re-evaluated:Patient Re-evaluated prior to induction Oxygen Delivery Method: Circle system utilized Preoxygenation: Pre-oxygenation with 100% oxygen Induction Type: IV induction Ventilation: Mask ventilation without difficulty LMA: LMA inserted LMA Size: 3.0 Number of attempts: 1 Placement Confirmation: positive ETCO2 Tube secured with: Tape Dental Injury: Teeth and Oropharynx as per pre-operative assessment

## 2020-02-20 NOTE — Anesthesia Postprocedure Evaluation (Signed)
Anesthesia Post Note  Patient: Alyssa Robinson  Procedure(s) Performed: OPEN REDUCTION INTERNAL (ORIF) FIXATION LEFT PATELLA (Left Knee)     Patient location during evaluation: PACU Anesthesia Type: Regional and General Level of consciousness: awake and alert Pain management: pain level controlled Vital Signs Assessment: post-procedure vital signs reviewed and stable Respiratory status: spontaneous breathing, nonlabored ventilation, respiratory function stable and patient connected to nasal cannula oxygen Cardiovascular status: blood pressure returned to baseline and stable Postop Assessment: no apparent nausea or vomiting Anesthetic complications: no   No complications documented.  Last Vitals:  Vitals:   02/20/20 1430 02/20/20 1445  BP: 139/72 (P) 137/74  Pulse: 78 (P) 80  Resp: 15 (P) 18  Temp:  (P) 36.6 C  SpO2: 93% (P) 96%    Last Pain:  Vitals:   02/20/20 1445  TempSrc:   PainSc: (P) 0-No pain                 Consepcion Utt P Randeep Biondolillo

## 2020-02-20 NOTE — Anesthesia Procedure Notes (Signed)
Anesthesia Regional Block: Femoral nerve block   Pre-Anesthetic Checklist: ,, timeout performed, Correct Patient, Correct Site, Correct Laterality, Correct Procedure,, site marked, risks and benefits discussed, Surgical consent,  Pre-op evaluation,  At surgeon's request and post-op pain management  Laterality: Left  Prep: chloraprep       Needles:  Injection technique: Single-shot  Needle Type: Echogenic Stimulator Needle     Needle Length: 10cm  Needle Gauge: 20     Additional Needles:   Procedures:,,,, ultrasound used (permanent image in chart),,,,  Narrative:  Start time: 02/20/2020 10:50 AM End time: 02/20/2020 11:00 AM Injection made incrementally with aspirations every 5 mL.  Performed by: Personally  Anesthesiologist: Murvin Natal, MD  Additional Notes: Functioning IV was confirmed and monitors were applied. A time-out was performed. Hand hygiene and sterile gloves were used. The thigh was placed in a frog-leg position and prepped in a sterile fashion. A 168mm 20ga BBraun echogenic stimulator needle was placed using ultrasound guidance.  Negative aspiration and negative test dose prior to incremental administration of local anesthetic. The patient tolerated the procedure well.

## 2020-02-20 NOTE — Progress Notes (Signed)
Assisted Dr. Roanna Banning with left, ultrasound guided, femoral block. Side rails up, monitors on throughout procedure. See vital signs in flow sheet. Tolerated Procedure well.

## 2020-02-20 NOTE — Op Note (Signed)
   Date of Surgery: 02/20/2020  INDICATIONS: [name] is a 69 y.o.-year-old female with a left patella fracture.  The patient did consent to the procedure after discussion of the risks and benefits.  PREOPERATIVE DIAGNOSIS: Left patella fracture  POSTOPERATIVE DIAGNOSIS: Same.  PROCEDURE: Open reduction internal fixation of left patella fracture  SURGEON: N. Eduard Roux, M.D.  ASSIST: Ciro Backer [place], [place]; necessary for the timely completion of procedure and due to complexity of procedure.  ANESTHESIA:  general, regional block  IV FLUIDS AND URINE: See anesthesia.  ESTIMATED BLOOD LOSS: Minimal mL.  IMPLANTS: Arthrex patella plate, Arthrex cannulated screw  DRAINS: None  COMPLICATIONS: see description of procedure.  DESCRIPTION OF PROCEDURE: The patient was brought to the operating room.  The patient had been signed prior to the procedure and this was documented. The patient had the anesthesia placed by the anesthesiologist.  A time-out was performed to confirm that this was the correct patient, site, side and location. The patient did receive antibiotics prior to the incision and was re-dosed during the procedure as needed at indicated intervals.  A tourniquet was placed.  The patient had the operative extremity prepped and draped in the standard surgical fashion.    Midline incision was placed directly over the anterior aspect of the knee.  Dissection was carried down to the peritenon.  Subperiosteal elevation was performed.  Fracture lines were exposed and visualized.  Organized hematoma removed.  Fracture fragments were irrigated out of the knee joint.  The knee itself was unremarkable.  Fracture fragments were then realigned and provisionally clamped and confirmed under fluoroscopy.  A single cannulated screw was placed from inferior to superior across the 2 main fracture fragments.  This allowed for excellent compression across the fracture.  Clamp was removed.  Given the  comminuted nature of the fracture we decided to use patella plate in order to capture all the fracture fragments.  Placement of hardware was confirmed under fluoroscopy.  Each screw was drill individually in a unicortical fashion using fluoroscopic guidance to confirm no intra-articular penetration.  I contoured the plate down to the bone as best as I could.  Surgical wound was then thoroughly irrigated and closed in layered fashion.  Sterile dressings were applied.  T ROM brace was placed with range of motion from 0 to 30 degrees.  Patient tolerated procedure well had no immediate complications.  POSTOPERATIVE PLAN: Weight-bear as tolerated to the left lower extremity and T ROM brace.  Follow-up in 2 weeks.  Azucena Cecil, MD 3:43 PM

## 2020-02-20 NOTE — Telephone Encounter (Signed)
Patient called she is requesting a order for a walker call back:[phone]

## 2020-02-20 NOTE — Telephone Encounter (Signed)
Ready for pick up

## 2020-02-20 NOTE — Transfer of Care (Signed)
Immediate Anesthesia Transfer of Care Note  Patient: Alyssa Robinson  Procedure(s) Performed: OPEN REDUCTION INTERNAL (ORIF) FIXATION LEFT PATELLA (Left Knee)  Patient Location: PACU  Anesthesia Type:GA combined with regional for post-op pain  Level of Consciousness: awake, alert  and oriented  Airway & Oxygen Therapy: Patient Spontanous Breathing and Patient connected to face mask oxygen  Post-op Assessment: Report given to RN and Post -op Vital signs reviewed and stable  Post vital signs: Reviewed and stable  Last Vitals:  Vitals Value Taken Time  BP 134/69 02/20/20 1403  Temp    Pulse 75 02/20/20 1405  Resp 14 02/20/20 1405  SpO2 100 % 02/20/20 1405  Vitals shown include unvalidated device data.  Last Pain:  Vitals:   02/20/20 1403  TempSrc:   PainSc: (P) 0-No pain      Patients Stated Pain Goal: 4 (88/82/80 0349)  Complications: No complications documented.

## 2020-02-20 NOTE — Discharge Instructions (Signed)
Postoperative instructions:  Weightbearing instructions: as tolerated in leg brace  Dressing instructions: Keep your dressing and/or splint clean and dry at all times.  It will be removed at your first post-operative appointment.  Your stitches and/or staples will be removed at this visit.  Incision instructions:  Do not soak your incision for 3 weeks after surgery.  If the incision gets wet, pat dry and do not scrub the incision.  Pain control:  You have been given a prescription to be taken as directed for post-operative pain control.  In addition, elevate the operative extremity above the heart at all times to prevent swelling and throbbing pain.  Take over-the-counter Colace, 100mg  by mouth twice a day while taking narcotic pain medications to help prevent constipation.  Follow up appointments: 1) 14 days for suture removal and wound check. 2) Dr. Erlinda Hong as scheduled.   -------------------------------------------------------------------------------------------------------------  After Surgery Pain Control:  After your surgery, post-surgical discomfort or pain is likely. This discomfort can last several days to a few weeks. At certain times of the day your discomfort may be more intense.  Did you receive a nerve block?  A nerve block can provide pain relief for one hour to two days after your surgery. As long as the nerve block is working, you will experience little or no sensation in the area the surgeon operated on.  As the nerve block wears off, you will begin to experience pain or discomfort. It is very important that you begin taking your prescribed pain medication before the nerve block fully wears off. Treating your pain at the first sign of the block wearing off will ensure your pain is better controlled and more tolerable when full-sensation returns. Do not wait until the pain is intolerable, as the medicine will be less effective. It is better to treat pain in advance than to try  and catch up.  General Anesthesia:  If you did not receive a nerve block during your surgery, you will need to start taking your pain medication shortly after your surgery and should continue to do so as prescribed by your surgeon.  Pain Medication:  Most commonly we prescribe Vicodin and Percocet for post-operative pain. Both of these medications contain a combination of acetaminophen (Tylenol) and a narcotic to help control pain.   It takes between 30 and 45 minutes before pain medication starts to work. It is important to take your medication before your pain level gets too intense.   Nausea is a common side effect of many pain medications. You will want to eat something before taking your pain medicine to help prevent nausea.   If you are taking a prescription pain medication that contains acetaminophen, we recommend that you do not take additional over the counter acetaminophen (Tylenol).  Other pain relieving options:   Using a cold pack to ice the affected area a few times a day (15 to 20 minutes at a time) can help to relieve pain, reduce swelling and bruising.   Elevation of the affected area can also help to reduce pain and swelling.   Post Anesthesia Home Care Instructions  Activity: Get plenty of rest for the remainder of the day. A responsible individual must stay with you for 24 hours following the procedure.  For the next 24 hours, DO NOT: -Drive a car -Paediatric nurse -Drink alcoholic beverages -Take any medication unless instructed by your physician -Make any legal decisions or sign important papers.  Meals: Start with liquid foods such as  gelatin or soup. Progress to regular foods as tolerated. Avoid greasy, spicy, heavy foods. If nausea and/or vomiting occur, drink only clear liquids until the nausea and/or vomiting subsides. Call your physician if vomiting continues.  Special Instructions/Symptoms: Your throat may feel dry or sore from the anesthesia or the  breathing tube placed in your throat during surgery. If this causes discomfort, gargle with warm salt water. The discomfort should disappear within 24 hours.  If you had a scopolamine patch placed behind your ear for the management of post- operative nausea and/or vomiting:  1. The medication in the patch is effective for 72 hours, after which it should be removed.  Wrap patch in a tissue and discard in the trash. Wash hands thoroughly with soap and water. 2. You may remove the patch earlier than 72 hours if you experience unpleasant side effects which may include dry mouth, dizziness or visual disturbances. 3. Avoid touching the patch. Wash your hands with soap and water after contact with the patch.    Regional Anesthesia Blocks  1. Numbness or the inability to move the "blocked" extremity may last from 3-48 hours after placement. The length of time depends on the medication injected and your individual response to the medication. If the numbness is not going away after 48 hours, call your surgeon.  2. The extremity that is blocked will need to be protected until the numbness is gone and the  Strength has returned. Because you cannot feel it, you will need to take extra care to avoid injury. Because it may be weak, you may have difficulty moving it or using it. You may not know what position it is in without looking at it while the block is in effect.  3. For blocks in the legs and feet, returning to weight bearing and walking needs to be done carefully. You will need to wait until the numbness is entirely gone and the strength has returned. You should be able to move your leg and foot normally before you try and bear weight or walk. You will need someone to be with you when you first try to ensure you do not fall and possibly risk injury.  4. Bruising and tenderness at the needle site are common side effects and will resolve in a few days.  5. Persistent numbness or new problems with movement  should be communicated to the surgeon or the [facility] [phone] [place] [phone]).

## 2020-02-20 NOTE — Anesthesia Preprocedure Evaluation (Addendum)
Anesthesia Evaluation  Patient identified by MRN, date of birth, ID band Patient awake    Reviewed: Allergy & Precautions, NPO status , Patient's Chart, lab work & pertinent test results  Airway Mallampati: II  TM Distance: >3 FB Neck ROM: Full    Dental  (+) Chipped,    Pulmonary neg pulmonary ROS,    Pulmonary exam normal breath sounds clear to auscultation       Cardiovascular hypertension, Normal cardiovascular exam Rhythm:Regular Rate:Normal     Neuro/Psych  Headaches, negative psych ROS   GI/Hepatic Neg liver ROS, GERD  Controlled,Diverticulosis   Endo/Other  negative endocrine ROS  Renal/GU negative Renal ROS     Musculoskeletal negative musculoskeletal ROS (+)   Abdominal   Peds  Hematology negative hematology ROS (+)   Anesthesia Other Findings left patella fracture  Reproductive/Obstetrics                            Anesthesia Physical Anesthesia Plan  ASA: II  Anesthesia Plan: General and Regional   Post-op Pain Management: GA combined w/ Regional for post-op pain   Induction: Intravenous  PONV Risk Score and Plan: 3 and Ondansetron, Dexamethasone and Treatment may vary due to age or medical condition  Airway Management Planned: LMA  Additional Equipment:   Intra-op Plan:   Post-operative Plan: Extubation in OR  Informed Consent: I have reviewed the patients History and Physical, chart, labs and discussed the procedure including the risks, benefits and alternatives for the proposed anesthesia with the patient or authorized representative who has indicated his/her understanding and acceptance.     Dental advisory given  Plan Discussed with: CRNA  Anesthesia Plan Comments:        Anesthesia Quick Evaluation

## 2020-02-20 NOTE — H&P (Signed)
PREOPERATIVE H&P  Chief Complaint: left patella fracture  HPI: Alyssa Robinson is a 69 y.o. female who presents for surgical treatment of left patella fracture.  She denies any changes in medical history.  Past Medical History:  Diagnosis Date  . Anemia    hx  . Bronchitis   . Bronchitis    hx  . Closed patellar sleeve fracture of left knee   . Diverticulosis   . GERD (gastroesophageal reflux disease)    occ  . Headache(784.0)    migraines occ  . Hypertension    no meds in over 55yrs  . Seasonal allergies    Past Surgical History:  Procedure Laterality Date  . ABDOMINAL HYSTERECTOMY     69 y/o  . APPENDECTOMY  03/17/2013   Procedure: INCIDENTAL APPENDECTOMY;  Surgeon: Gwenyth Ober, MD;  Location: [place];  Service: General;;  . capsule endoscopy    . COLON SURGERY    . COLONOSCOPY     in 2012 -   . COLOSTOMY CLOSURE  03/17/2013  . COLOSTOMY REVERSAL  03/17/2013   Procedure: COLOSTOMY REVERSAL;  Surgeon: Gwenyth Ober, MD;  Location: [place];  Service: General;;  . EUS N/A 04/30/2015   Procedure: UPPER ENDOSCOPIC ULTRASOUND (EUS) LINEAR;  Surgeon: Carol Ada, MD;  [facility];  Service: Endoscopy;  Laterality: N/A;  . HERNIA REPAIR     X2 2-4 y/o inguinal  . LAPAROTOMY N/A 10/17/2012   Procedure: EXPLORATORY LAPAROTOMY,  COLON RESECTION AND COLOSTOMY;  Surgeon: Gwenyth Ober, MD;  Location: [place];  Service: General;  Laterality: N/A;  . LYSIS OF ADHESION  03/17/2013   Procedure: LYSIS OF ADHESION;  Surgeon: Gwenyth Ober, MD;  Location: [place];  Service: General;;  . UPPER GASTROINTESTINAL ENDOSCOPY     2013 -   Social History   Socioeconomic History  . Marital status: Married    Spouse name: Not on file  . Number of children: Not on file  . Years of education: Not on file  . Highest education level: Not on file  Occupational History  . Not on file  Tobacco Use  . Smoking status: Never Smoker  . Smokeless tobacco: Never Used  . Tobacco comment:  EXPOSED TO SECOND HAND SMOKE   Vaping Use  . Vaping Use: Never used  Substance and Sexual Activity  . Alcohol use: Not Currently    Alcohol/week: 2.0 standard drinks    Types: 2 Glasses of wine per week  . Drug use: No  . Sexual activity: Not Currently    Birth control/protection: Post-menopausal  Other Topics Concern  . Not on file  Social History Narrative  . Not on file   Social Determinants of Health   Financial Resource Strain:   . Difficulty of Paying Living Expenses: Not on file  Food Insecurity:   . Worried About Charity fundraiser in the Last Year: Not on file  . Ran Out of Food in the Last Year: Not on file  Transportation Needs:   . Lack of Transportation (Medical): Not on file  . Lack of Transportation (Non-Medical): Not on file  Physical Activity:   . Days of Exercise per Week: Not on file  . Minutes of Exercise per Session: Not on file  Stress:   . Feeling of Stress : Not on file  Social Connections:   . Frequency of Communication with Friends and Family: Not on file  . Frequency of Social Gatherings with Friends  and Family: Not on file  . Attends Religious Services: Not on file  . Active Member of Clubs or Organizations: Not on file  . Attends Archivist Meetings: Not on file  . Marital Status: Not on file   Family History  Problem Relation Age of Onset  . Esophageal cancer Mother        and ovarian cancer  . Alcohol abuse Mother   . Arthritis Mother   . Heart disease Father   . Alcohol abuse Father   . Hypertension Father   . Diabetes Father   . Early death Father   . Colon cancer Other   . Pancreatic cancer Other   . Stroke Neg Hx   . Hyperlipidemia Neg Hx   . Kidney disease Neg Hx   . COPD Neg Hx    Allergies  Allergen Reactions  . Caffeine Palpitations and Nausea And Vomiting    Heart racing with high doses Makes heart race Heart racing Makes heart race Heart racing with high doses Makes heart race Makes heart race   .  Sodium Lauryl Sulfate Rash    Swollen gums    Prior to Admission medications   Medication Sig Start Date End Date Taking? Authorizing Provider  ciclopirox (LOPROX) 0.77 % SUSP Apply topically 2 (two) times daily.   Yes [provider]  Docusate Sodium (COLACE PO) Take by mouth.   Yes [provider]  fexofenadine (ALLEGRA) 180 MG tablet Take 180 mg by mouth daily as needed for allergies.    Yes [provider]  oxyCODONE (OXY IR/ROXICODONE) 5 MG immediate release tablet Take 0.5-1 tablets (2.5-5 mg total) by mouth every 6 (six) hours as needed for severe pain. 02/15/20  Yes Carlisle Cater, PA-C  Pediatric Multiple Vit-C-FA (PEDIATRIC MULTIVITAMIN) chewable tablet Chew by mouth.   Yes [provider]  Probiotic Product ([place]) 170 MG CAPS Take by mouth.   Yes [provider]  sodium chloride (OCEAN) 0.65 % SOLN nasal spray Place 1 spray into both nostrils as needed for congestion.   Yes [provider]     Positive ROS: All other systems have been reviewed and were otherwise negative with the exception of those mentioned in the HPI and as above.  Physical Exam: General: Alert, no acute distress Cardiovascular: No pedal edema Respiratory: No cyanosis, no use of accessory musculature GI: abdomen soft Skin: No lesions in the area of chief complaint Neurologic: Sensation intact distally Psychiatric: Patient is competent for consent with normal mood and affect Lymphatic: no lymphedema  MUSCULOSKELETAL: exam stable  Assessment: left patella fracture  Plan: Plan for Procedure(s): OPEN REDUCTION INTERNAL (ORIF) FIXATION LEFT PATELLA  The risks benefits and alternatives were discussed with the patient including but not limited to the risks of nonoperative treatment, versus surgical intervention including infection, bleeding, nerve injury,  blood clots, cardiopulmonary complications, morbidity, mortality, among others,  and they were willing to proceed.   Preoperative templating of the joint replacement has been completed, documented, and submitted to the Operating Room personnel in order to optimize intra-operative equipment management.   Eduard Roux, MD 02/20/2020 7:05 AM

## 2020-02-23 ENCOUNTER — Telehealth: Payer: Self-pay | Admitting: Orthopaedic Surgery

## 2020-02-23 NOTE — Telephone Encounter (Signed)
Patient aware.

## 2020-02-23 NOTE — Telephone Encounter (Signed)
Patient called advised the Oxycodone makes her feel funny and off balance. Patient said she started taking  half a tablet started at 9:00pm last night, 1:00am this morning and 5:00am. Patient asked if this is ok? The number to contact patient is [phone]

## 2020-02-23 NOTE — Telephone Encounter (Signed)
yes

## 2020-02-24 ENCOUNTER — Encounter (HOSPITAL_BASED_OUTPATIENT_CLINIC_OR_DEPARTMENT_OTHER): Payer: Self-pay | Admitting: Orthopaedic Surgery

## 2020-02-26 DIAGNOSIS — I10 Essential (primary) hypertension: Secondary | ICD-10-CM | POA: Diagnosis not present

## 2020-02-26 DIAGNOSIS — K5790 Diverticulosis of intestine, part unspecified, without perforation or abscess without bleeding: Secondary | ICD-10-CM | POA: Diagnosis not present

## 2020-02-26 DIAGNOSIS — G43909 Migraine, unspecified, not intractable, without status migrainosus: Secondary | ICD-10-CM | POA: Diagnosis not present

## 2020-02-26 DIAGNOSIS — Z471 Aftercare following joint replacement surgery: Secondary | ICD-10-CM | POA: Diagnosis not present

## 2020-02-26 DIAGNOSIS — K5900 Constipation, unspecified: Secondary | ICD-10-CM | POA: Diagnosis not present

## 2020-02-26 DIAGNOSIS — E785 Hyperlipidemia, unspecified: Secondary | ICD-10-CM | POA: Diagnosis not present

## 2020-02-26 DIAGNOSIS — K219 Gastro-esophageal reflux disease without esophagitis: Secondary | ICD-10-CM | POA: Diagnosis not present

## 2020-02-26 DIAGNOSIS — J40 Bronchitis, not specified as acute or chronic: Secondary | ICD-10-CM | POA: Diagnosis not present

## 2020-02-26 DIAGNOSIS — Z9181 History of falling: Secondary | ICD-10-CM | POA: Diagnosis not present

## 2020-02-26 DIAGNOSIS — D649 Anemia, unspecified: Secondary | ICD-10-CM | POA: Diagnosis not present

## 2020-02-26 DIAGNOSIS — M8580 Other specified disorders of bone density and structure, unspecified site: Secondary | ICD-10-CM | POA: Diagnosis not present

## 2020-02-26 DIAGNOSIS — S82032D Displaced transverse fracture of left patella, subsequent encounter for closed fracture with routine healing: Secondary | ICD-10-CM | POA: Diagnosis not present

## 2020-02-26 DIAGNOSIS — J302 Other seasonal allergic rhinitis: Secondary | ICD-10-CM | POA: Diagnosis not present

## 2020-02-27 ENCOUNTER — Telehealth: Payer: Self-pay

## 2020-02-27 ENCOUNTER — Other Ambulatory Visit: Payer: Self-pay

## 2020-02-27 NOTE — Telephone Encounter (Signed)
Corene Cornea from [facility] [employer] called in to get diagnosis code for patient Alyssa Robinson , says original  order does not include diagnosis. Would like for it to be faxed to number below   Fax : [phone]

## 2020-02-27 NOTE — Telephone Encounter (Signed)
S/p OFIR patella fx 02/20/20 order written for rolling walker for safe ambulation and faxed to number as requested.

## 2020-02-27 NOTE — Telephone Encounter (Signed)
Dx: patella fracture Yes to the walker script

## 2020-02-27 NOTE — Telephone Encounter (Signed)
See message below. Advise on both messages.

## 2020-02-27 NOTE — Telephone Encounter (Signed)
Corene Cornea called back stating the pt informed him the walker is supposed to have from wheels and so he will ned that to be fixed in addition to the previous message and can be faxed to same number

## 2020-03-03 DIAGNOSIS — K5900 Constipation, unspecified: Secondary | ICD-10-CM | POA: Diagnosis not present

## 2020-03-03 DIAGNOSIS — D649 Anemia, unspecified: Secondary | ICD-10-CM | POA: Diagnosis not present

## 2020-03-03 DIAGNOSIS — G43909 Migraine, unspecified, not intractable, without status migrainosus: Secondary | ICD-10-CM | POA: Diagnosis not present

## 2020-03-03 DIAGNOSIS — J40 Bronchitis, not specified as acute or chronic: Secondary | ICD-10-CM | POA: Diagnosis not present

## 2020-03-03 DIAGNOSIS — S82032D Displaced transverse fracture of left patella, subsequent encounter for closed fracture with routine healing: Secondary | ICD-10-CM | POA: Diagnosis not present

## 2020-03-03 DIAGNOSIS — I10 Essential (primary) hypertension: Secondary | ICD-10-CM | POA: Diagnosis not present

## 2020-03-05 ENCOUNTER — Other Ambulatory Visit: Payer: Self-pay

## 2020-03-05 ENCOUNTER — Ambulatory Visit (INDEPENDENT_AMBULATORY_CARE_PROVIDER_SITE_OTHER): Payer: Medicare Other | Admitting: Orthopaedic Surgery

## 2020-03-05 ENCOUNTER — Ambulatory Visit (INDEPENDENT_AMBULATORY_CARE_PROVIDER_SITE_OTHER): Payer: Medicare Other

## 2020-03-05 ENCOUNTER — Ambulatory Visit: Payer: Self-pay

## 2020-03-05 DIAGNOSIS — S82042D Displaced comminuted fracture of left patella, subsequent encounter for closed fracture with routine healing: Secondary | ICD-10-CM

## 2020-03-05 DIAGNOSIS — Z9889 Other specified postprocedural states: Secondary | ICD-10-CM

## 2020-03-05 DIAGNOSIS — Z8781 Personal history of (healed) traumatic fracture: Secondary | ICD-10-CM

## 2020-03-05 NOTE — Progress Notes (Signed)
Post-Op Visit Note   Patient: Alyssa Robinson           Date of Birth: [birth year]           MRN: [id number] Visit Date: 03/05/2020 PCP: Hoyt Koch, MD   Assessment & Plan:  Chief Complaint:  Chief Complaint  Patient presents with  . Left Knee - Routine Post Op   Visit Diagnoses:  1. S/P ORIF (open reduction internal fixation) fracture     Plan: Naseem is 2 weeks status post ORIF left patella fracture.  No real complaints today.  Ambulating with a walker and a Bledsoe brace.  No constitutional symptoms.  Incision is healed without any signs of infection.  There is mild swelling about the knee.  She is slightly tender to the inferior medial corner of the patellar hardware.  Sutures removed and Steri-Strips applied.  Brace was opened up to allow up to 30 degrees of flexion.  She may weight-bear as tolerated in the brace and she will continue with home health PT.  Recheck in 4 weeks with two-view x-rays of the left knee.  Anticipate opening the brace up to 60 degrees of flexion at that time.  We briefly talked about the potential for hardware removal if needed down the road.  Follow-Up Instructions: Return in about 2 weeks (around 03/19/2020).   Orders:  Orders Placed This Encounter  Procedures  . XR Knee 1-2 Views Left   No orders of the defined types were placed in this encounter.   Imaging: XR Knee 1-2 Views Left  Result Date: 03/05/2020 Stable fixation and alignment of patella fracture.   PMFS History: Patient Active Problem List   Diagnosis Date Noted  . Displaced comminuted fracture of left patella, initial encounter for closed fracture 02/20/2020  . Dizziness 10/14/2019  . Lipoma of abdominal wall 08/04/2019  . Osteopenia 01/28/2019  . Allergic rhinitis 01/28/2019  . History of shingles 01/28/2019  . Thrombocytopenia ([place]) 10/17/2017  . Hyperlipidemia with target LDL less than 130 09/14/2015  . Vaginal atrophy 06/01/2015  . Cyst of pancreas  03/06/2015  . Routine general medical examination at a health care facility 12/11/2012  . Essential hypertension, benign 12/11/2012  . Constipation 10/17/2012   Past Medical History:  Diagnosis Date  . Anemia    hx  . Bronchitis   . Bronchitis    hx  . Closed patellar sleeve fracture of left knee   . Diverticulosis   . GERD (gastroesophageal reflux disease)    occ  . Headache(784.0)    migraines occ  . Hypertension    no meds in over 52yrs  . Seasonal allergies     Family History  Problem Relation Age of Onset  . Esophageal cancer Mother        and ovarian cancer  . Alcohol abuse Mother   . Arthritis Mother   . Heart disease Father   . Alcohol abuse Father   . Hypertension Father   . Diabetes Father   . Early death Father   . Colon cancer Other   . Pancreatic cancer Other   . Stroke Neg Hx   . Hyperlipidemia Neg Hx   . Kidney disease Neg Hx   . COPD Neg Hx     Past Surgical History:  Procedure Laterality Date  . ABDOMINAL HYSTERECTOMY     69 y/o  . APPENDECTOMY  03/17/2013   Procedure: INCIDENTAL APPENDECTOMY;  Surgeon: Gwenyth Ober, MD;  Location: [place];  Service:  General;;  . capsule endoscopy    . COLON SURGERY    . COLONOSCOPY     in 2012 -   . COLOSTOMY CLOSURE  03/17/2013  . COLOSTOMY REVERSAL  03/17/2013   Procedure: COLOSTOMY REVERSAL;  Surgeon: Gwenyth Ober, MD;  Location: [place];  Service: General;;  . EUS N/A 04/30/2015   Procedure: UPPER ENDOSCOPIC ULTRASOUND (EUS) LINEAR;  Surgeon: Carol Ada, MD;  [facility];  Service: Endoscopy;  Laterality: N/A;  . HERNIA REPAIR     X2 2-4 y/o inguinal  . LAPAROTOMY N/A 10/17/2012   Procedure: EXPLORATORY LAPAROTOMY,  COLON RESECTION AND COLOSTOMY;  Surgeon: Gwenyth Ober, MD;  Location: [place];  Service: General;  Laterality: N/A;  . LYSIS OF ADHESION  03/17/2013   Procedure: LYSIS OF ADHESION;  Surgeon: Gwenyth Ober, MD;  Location: [place];  Service: General;;  . ORIF PATELLA Left 02/20/2020    Procedure: OPEN REDUCTION INTERNAL (ORIF) FIXATION LEFT PATELLA;  Surgeon: Leandrew Koyanagi, MD;  Location: [place];  Service: Orthopedics;  Laterality: Left;  . UPPER GASTROINTESTINAL ENDOSCOPY     2013 -   Social History   Occupational History  . Not on file  Tobacco Use  . Smoking status: Never Smoker  . Smokeless tobacco: Never Used  . Tobacco comment: EXPOSED TO SECOND HAND SMOKE   Vaping Use  . Vaping Use: Never used  Substance and Sexual Activity  . Alcohol use: Not Currently    Alcohol/week: 2.0 standard drinks    Types: 2 Glasses of wine per week  . Drug use: No  . Sexual activity: Not Currently    Birth control/protection: Post-menopausal

## 2020-03-08 ENCOUNTER — Telehealth: Payer: Self-pay

## 2020-03-08 NOTE — Telephone Encounter (Signed)
Yes that's fine.  Please send in amoxicillin 2 g.  Thanks.

## 2020-03-08 NOTE — Telephone Encounter (Signed)
Patient called she stated she fell and chipped her tooth she is wondering if she is able to schedule an appointment with her dentist. Call [phone]

## 2020-03-10 ENCOUNTER — Other Ambulatory Visit: Payer: Self-pay

## 2020-03-10 DIAGNOSIS — K5900 Constipation, unspecified: Secondary | ICD-10-CM | POA: Diagnosis not present

## 2020-03-10 DIAGNOSIS — G43909 Migraine, unspecified, not intractable, without status migrainosus: Secondary | ICD-10-CM | POA: Diagnosis not present

## 2020-03-10 DIAGNOSIS — S82032D Displaced transverse fracture of left patella, subsequent encounter for closed fracture with routine healing: Secondary | ICD-10-CM | POA: Diagnosis not present

## 2020-03-10 DIAGNOSIS — J40 Bronchitis, not specified as acute or chronic: Secondary | ICD-10-CM | POA: Diagnosis not present

## 2020-03-10 DIAGNOSIS — I10 Essential (primary) hypertension: Secondary | ICD-10-CM | POA: Diagnosis not present

## 2020-03-10 DIAGNOSIS — D649 Anemia, unspecified: Secondary | ICD-10-CM | POA: Diagnosis not present

## 2020-03-10 MED ORDER — AMOXICILLIN 500 MG PO TABS: ORAL_TABLET | ORAL | 0 refills | Status: DC

## 2020-03-10 NOTE — Telephone Encounter (Signed)
Rx sent into pharm. patient aware.

## 2020-03-17 ENCOUNTER — Ambulatory Visit (INDEPENDENT_AMBULATORY_CARE_PROVIDER_SITE_OTHER): Payer: Medicare Other | Admitting: Orthopaedic Surgery

## 2020-03-17 ENCOUNTER — Encounter: Payer: Self-pay | Admitting: Orthopaedic Surgery

## 2020-03-17 ENCOUNTER — Ambulatory Visit (INDEPENDENT_AMBULATORY_CARE_PROVIDER_SITE_OTHER): Payer: Medicare Other

## 2020-03-17 ENCOUNTER — Telehealth: Payer: Self-pay

## 2020-03-17 DIAGNOSIS — S82042D Displaced comminuted fracture of left patella, subsequent encounter for closed fracture with routine healing: Secondary | ICD-10-CM

## 2020-03-17 DIAGNOSIS — I10 Essential (primary) hypertension: Secondary | ICD-10-CM | POA: Diagnosis not present

## 2020-03-17 DIAGNOSIS — S82032D Displaced transverse fracture of left patella, subsequent encounter for closed fracture with routine healing: Secondary | ICD-10-CM | POA: Diagnosis not present

## 2020-03-17 DIAGNOSIS — Z8781 Personal history of (healed) traumatic fracture: Secondary | ICD-10-CM

## 2020-03-17 DIAGNOSIS — G43909 Migraine, unspecified, not intractable, without status migrainosus: Secondary | ICD-10-CM | POA: Diagnosis not present

## 2020-03-17 DIAGNOSIS — S82032A Displaced transverse fracture of left patella, initial encounter for closed fracture: Secondary | ICD-10-CM | POA: Insufficient documentation

## 2020-03-17 DIAGNOSIS — K5900 Constipation, unspecified: Secondary | ICD-10-CM | POA: Diagnosis not present

## 2020-03-17 DIAGNOSIS — J40 Bronchitis, not specified as acute or chronic: Secondary | ICD-10-CM | POA: Diagnosis not present

## 2020-03-17 DIAGNOSIS — Z9889 Other specified postprocedural states: Secondary | ICD-10-CM | POA: Diagnosis not present

## 2020-03-17 DIAGNOSIS — D649 Anemia, unspecified: Secondary | ICD-10-CM | POA: Diagnosis not present

## 2020-03-17 MED ORDER — AMOXICILLIN 500 MG PO CAPS: 2000.0000 mg | ORAL_CAPSULE | Freq: Once | ORAL | 6 refills | Status: AC

## 2020-03-17 NOTE — Telephone Encounter (Signed)
Flow with [facility] called in to see if patient still needs to continue home health care or not

## 2020-03-17 NOTE — Progress Notes (Signed)
Post-Op Visit Note   Patient: Alyssa Robinson           Date of Birth: Apr 30, 1950           MRN: [id number] Visit Date: 03/17/2020 PCP: Hoyt Koch, MD   Assessment & Plan:  Chief Complaint:  Chief Complaint  Patient presents with  . Left Knee - Follow-up, Routine Post Op   Visit Diagnoses:  1. S/P ORIF (open reduction internal fixation) fracture   2. Closed displaced transverse fracture of left patella, initial encounter     Plan:  Naiyah is 4 weeks status post ORIF left patella fracture.  She is overall doing well other than some swelling.  She has been compliant with the brace.  She takes Tylenol as needed.  Overall the pain is very well tolerated.  Surgical scar is fully healed.  Steri-Strips removed today.  She has some persistent swelling around the knee as expected.  No neurovascular compromise.  Well fitting brace.  X-rays demonstrate stable fixation without complications.  Ace wrap was applied for compression to help with the swelling.  I open the brace up to 60 degrees of flexion but she can also begin gentle heel slides as tolerated without the brace.  She can continue to weight-bear as tolerated in the Bledsoe brace with a walker or cane as needed.  Recheck in 2 weeks with lateral x-ray of the left knee.  Plan is to open the brace completely and to send her to outpatient physical therapy.  Follow-Up Instructions: Return in about 2 weeks (around 03/31/2020).   Orders:  Orders Placed This Encounter  Procedures  . XR Knee 1-2 Views Left   Meds ordered this encounter  Medications  . amoxicillin (AMOXIL) 500 MG capsule    Sig: Take 4 capsules (2,000 mg total) by mouth once for 1 dose.    Dispense:  4 capsule    Refill:  6    Imaging: XR Knee 1-2 Views Left  Result Date: 03/17/2020 Stable fixation of patella fracture without any complications.     PMFS History: Patient Active Problem List   Diagnosis Date Noted  . Closed displaced transverse  fracture of left patella 03/17/2020  . Displaced comminuted fracture of left patella, initial encounter for closed fracture 02/20/2020  . Dizziness 10/14/2019  . Lipoma of abdominal wall 08/04/2019  . Osteopenia 01/28/2019  . Allergic rhinitis 01/28/2019  . History of shingles 01/28/2019  . Thrombocytopenia ([place]) 10/17/2017  . Hyperlipidemia with target LDL less than 130 09/14/2015  . Vaginal atrophy 06/01/2015  . 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 Routine general medical examination at a health care facility 12/11/2012  . Essential hypertension, benign 12/11/2012  . Constipation 10/17/2012   Past Medical History:  Diagnosis Date  . Anemia    hx  . Bronchitis   . Bronchitis    hx  . Closed patellar sleeve fracture of left knee   . Diverticulosis   . GERD (gastroesophageal reflux disease)    occ  . Headache(784.0)    migraines occ  . Hypertension    no meds in over 41yrs  . Seasonal allergies     Family History  Problem Relation Age of Onset  . Esophageal cancer Mother        and ovarian cancer  . Alcohol abuse Mother   . Arthritis Mother   . Heart disease Father   . Alcohol abuse Father   . Hypertension Father   . Diabetes Father   .  Early death Father   . Colon cancer Other   . Pancreatic cancer Other   . Stroke Neg Hx   . Hyperlipidemia Neg Hx   . Kidney disease Neg Hx   . COPD Neg Hx     Past Surgical History:  Procedure Laterality Date  . ABDOMINAL HYSTERECTOMY     69 y/o  . APPENDECTOMY  03/17/2013   Procedure: INCIDENTAL APPENDECTOMY;  Surgeon: Gwenyth Ober, MD;  Location: [place];  Service: General;;  . capsule endoscopy    . COLON SURGERY    . COLONOSCOPY     in 2012 -   . COLOSTOMY CLOSURE  03/17/2013  . COLOSTOMY REVERSAL  03/17/2013   Procedure: COLOSTOMY REVERSAL;  Surgeon: Gwenyth Ober, MD;  Location: [place];  Service: General;;  . EUS N/A 04/30/2015   Procedure: UPPER ENDOSCOPIC ULTRASOUND (EUS) LINEAR;  Surgeon: Carol Ada, MD;  [facility];  Service: Endoscopy;  Laterality: N/A;  . HERNIA REPAIR     X2 2-4 y/o inguinal  . LAPAROTOMY N/A 10/17/2012   Procedure: EXPLORATORY LAPAROTOMY,  COLON RESECTION AND COLOSTOMY;  Surgeon: Gwenyth Ober, MD;  Location: [place];  Service: General;  Laterality: N/A;  . LYSIS OF ADHESION  03/17/2013   Procedure: LYSIS OF ADHESION;  Surgeon: Gwenyth Ober, MD;  Location: [place];  Service: General;;  . ORIF PATELLA Left 02/20/2020   Procedure: OPEN REDUCTION INTERNAL (ORIF) FIXATION LEFT PATELLA;  Surgeon: Leandrew Koyanagi, MD;  Location: [place];  Service: Orthopedics;  Laterality: Left;  . UPPER GASTROINTESTINAL ENDOSCOPY     2013 -   Social History   Occupational History  . Not on file  Tobacco Use  . Smoking status: Never Smoker  . Smokeless tobacco: Never Used  . Tobacco comment: EXPOSED TO SECOND HAND SMOKE   Vaping Use  . Vaping Use: Never used  Substance and Sexual Activity  . Alcohol use: Not Currently    Alcohol/week: 2.0 standard drinks    Types: 2 Glasses of wine per week  . Drug use: No  . Sexual activity: Not Currently    Birth control/protection: Post-menopausal

## 2020-03-17 NOTE — Telephone Encounter (Signed)
Please advise. Thanks.  

## 2020-03-18 NOTE — Telephone Encounter (Signed)
It looks like Alyssa Robinson is starting her in oppt from yesterday's note

## 2020-03-25 ENCOUNTER — Other Ambulatory Visit: Payer: Self-pay

## 2020-03-25 ENCOUNTER — Ambulatory Visit (INDEPENDENT_AMBULATORY_CARE_PROVIDER_SITE_OTHER): Payer: Medicare Other

## 2020-03-25 DIAGNOSIS — Z23 Encounter for immunization: Secondary | ICD-10-CM

## 2020-03-27 DIAGNOSIS — Z471 Aftercare following joint replacement surgery: Secondary | ICD-10-CM | POA: Diagnosis not present

## 2020-04-01 ENCOUNTER — Ambulatory Visit (INDEPENDENT_AMBULATORY_CARE_PROVIDER_SITE_OTHER): Payer: Medicare Other

## 2020-04-01 ENCOUNTER — Ambulatory Visit (INDEPENDENT_AMBULATORY_CARE_PROVIDER_SITE_OTHER): Payer: Medicare Other | Admitting: Orthopaedic Surgery

## 2020-04-01 ENCOUNTER — Encounter: Payer: Self-pay | Admitting: Orthopaedic Surgery

## 2020-04-01 ENCOUNTER — Other Ambulatory Visit: Payer: Self-pay

## 2020-04-01 DIAGNOSIS — Z8781 Personal history of (healed) traumatic fracture: Secondary | ICD-10-CM

## 2020-04-01 DIAGNOSIS — S82042D Displaced comminuted fracture of left patella, subsequent encounter for closed fracture with routine healing: Secondary | ICD-10-CM

## 2020-04-01 DIAGNOSIS — Z9889 Other specified postprocedural states: Secondary | ICD-10-CM | POA: Diagnosis not present

## 2020-04-01 NOTE — Progress Notes (Signed)
Post-Op Visit Note   Patient: Alyssa Robinson           Date of Birth: 1950-06-07           MRN: [id number] Visit Date: 04/01/2020 PCP: Hoyt Koch, MD   Assessment & Plan:  Chief Complaint:  Chief Complaint  Patient presents with  . Left Knee - Routine Post Op   Visit Diagnoses:  1. S/P ORIF (open reduction internal fixation) fracture     Plan:   Jyasia is 6 weeks status post ORIF left patella fracture.  She reports some mild pain at night.  Motion is considerably better.  Takes Tylenol for the pain.  No real complaints otherwise.  Surgical scar is fully healed.  Range of motion is progressing.  Currently measuring 5 to 85 degrees.  No signs of infection.  Mild swelling.  No joint effusion.  X-rays demonstrate continued healing of the fracture without hardware complications.  At this time she can discontinue the Bledsoe brace.  She can progress her range of motion as tolerated.  Referral to Andris Flurry, PT was made today.  Recheck in 6 weeks with two-view x-rays of the left knee.  Follow-Up Instructions: Return in about 6 weeks (around 05/13/2020).   Orders:  Orders Placed This Encounter  Procedures  . XR Knee 1-2 Views Left   No orders of the defined types were placed in this encounter.   Imaging: XR Knee 1-2 Views Left  Result Date: 04/01/2020 Stable fixation of patella fracture without hardware complication.  Fracture appears to be be healing appropriately.   PMFS History: Patient Active Problem List   Diagnosis Date Noted  . Closed displaced transverse fracture of left patella 03/17/2020  . Displaced comminuted fracture of left patella, initial encounter for closed fracture 02/20/2020  . Dizziness 10/14/2019  . Lipoma of abdominal wall 08/04/2019  . Osteopenia 01/28/2019  . Allergic rhinitis 01/28/2019  . History of shingles 01/28/2019  . Thrombocytopenia ([place]) 10/17/2017  . Hyperlipidemia with target LDL less than 130 09/14/2015  . Vaginal  atrophy 06/01/2015  . 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 Routine general medical examination at a health care facility 12/11/2012  . Essential hypertension, benign 12/11/2012  . Constipation 10/17/2012   Past Medical History:  Diagnosis Date  . Anemia    hx  . Bronchitis   . Bronchitis    hx  . Closed patellar sleeve fracture of left knee   . Diverticulosis   . GERD (gastroesophageal reflux disease)    occ  . Headache(784.0)    migraines occ  . Hypertension    no meds in over 16yrs  . Seasonal allergies     Family History  Problem Relation Age of Onset  . Esophageal cancer Mother        and ovarian cancer  . Alcohol abuse Mother   . Arthritis Mother   . Heart disease Father   . Alcohol abuse Father   . Hypertension Father   . Diabetes Father   . Early death Father   . Colon cancer Other   . Pancreatic cancer Other   . Stroke Neg Hx   . Hyperlipidemia Neg Hx   . Kidney disease Neg Hx   . COPD Neg Hx     Past Surgical History:  Procedure Laterality Date  . ABDOMINAL HYSTERECTOMY     69 y/o  . APPENDECTOMY  03/17/2013   Procedure: INCIDENTAL APPENDECTOMY;  Surgeon: Gwenyth Ober, MD;  Location: [place];  Service: General;;  . capsule endoscopy    . COLON SURGERY    . COLONOSCOPY     in 2012 -   . COLOSTOMY CLOSURE  03/17/2013  . COLOSTOMY REVERSAL  03/17/2013   Procedure: COLOSTOMY REVERSAL;  Surgeon: Gwenyth Ober, MD;  Location: [place];  Service: General;;  . EUS N/A 04/30/2015   Procedure: UPPER ENDOSCOPIC ULTRASOUND (EUS) LINEAR;  Surgeon: Carol Ada, MD;  [facility];  Service: Endoscopy;  Laterality: N/A;  . HERNIA REPAIR     X2 2-4 y/o inguinal  . LAPAROTOMY N/A 10/17/2012   Procedure: EXPLORATORY LAPAROTOMY,  COLON RESECTION AND COLOSTOMY;  Surgeon: Gwenyth Ober, MD;  Location: [place];  Service: General;  Laterality: N/A;  . LYSIS OF ADHESION  03/17/2013   Procedure: LYSIS OF ADHESION;  Surgeon: Gwenyth Ober, MD;  Location: [place];  Service:  General;;  . ORIF PATELLA Left 02/20/2020   Procedure: OPEN REDUCTION INTERNAL (ORIF) FIXATION LEFT PATELLA;  Surgeon: Leandrew Koyanagi, MD;  Location: [place];  Service: Orthopedics;  Laterality: Left;  . UPPER GASTROINTESTINAL ENDOSCOPY     2013 -   Social History   Occupational History  . Not on file  Tobacco Use  . Smoking status: Never Smoker  . Smokeless tobacco: Never Used  . Tobacco comment: EXPOSED TO SECOND HAND SMOKE   Vaping Use  . Vaping Use: Never used  Substance and Sexual Activity  . Alcohol use: Not Currently    Alcohol/week: 2.0 standard drinks    Types: 2 Glasses of wine per week  . Drug use: No  . Sexual activity: Not Currently    Birth control/protection: Post-menopausal

## 2020-04-06 ENCOUNTER — Telehealth (INDEPENDENT_AMBULATORY_CARE_PROVIDER_SITE_OTHER): Payer: Medicare Other | Admitting: Family

## 2020-04-06 DIAGNOSIS — J209 Acute bronchitis, unspecified: Secondary | ICD-10-CM | POA: Diagnosis not present

## 2020-04-06 MED ORDER — AMOXICILLIN 875 MG PO TABS: 875.0000 mg | ORAL_TABLET | Freq: Two times a day (BID) | ORAL | 0 refills | Status: DC

## 2020-04-06 NOTE — Progress Notes (Signed)
Alyssa Robinson is a 69 y.o. female with the following history as recorded in [employer]:  Patient Active Problem List   Diagnosis Date Noted  . Closed displaced transverse fracture of left patella 03/17/2020  . Displaced comminuted fracture of left patella, initial encounter for closed fracture 02/20/2020  . Dizziness 10/14/2019  . Lipoma of abdominal wall 08/04/2019  . Osteopenia 01/28/2019  . Allergic rhinitis 01/28/2019  . History of shingles 01/28/2019  . Thrombocytopenia ([place]) 10/17/2017  . Hyperlipidemia with target LDL less than 130 09/14/2015  . Vaginal atrophy 06/01/2015  . 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 Routine general medical examination at a health care facility 12/11/2012  . Essential hypertension, benign 12/11/2012  . Constipation 10/17/2012    Current Outpatient Medications  Medication Sig Dispense Refill  . calcium-vitamin D (OSCAL WITH D) 500-200 MG-UNIT tablet Take 1 tablet by mouth 3 (three) times daily. 90 tablet 6  . ciclopirox (LOPROX) 0.77 % SUSP Apply topically 2 (two) times daily.    Mariane Baumgarten Sodium (COLACE PO) Take by mouth.    . fexofenadine (ALLEGRA) 180 MG tablet Take 180 mg by mouth daily as needed for allergies.     . Pediatric Multiple Vit-C-FA (PEDIATRIC MULTIVITAMIN) chewable tablet Chew by mouth.    . Probiotic Product ([place]) 170 MG CAPS Take by mouth.    . sodium chloride (OCEAN) 0.65 % SOLN nasal spray Place 1 spray into both nostrils as needed for congestion.    Marland Kitchen amoxicillin (AMOXIL) 500 MG tablet TAKE 4 TABS PO 3 MINS PRIOR TO PROCEDURE. (Patient not taking: Reported on 04/06/2020) 4 tablet 0  . amoxicillin (AMOXIL) 875 MG tablet Take 1 tablet (875 mg total) by mouth 2 (two) times daily. 20 tablet 0   No current facility-administered medications for this visit.    Allergies: Caffeine and Sodium lauryl sulfate  Past Medical History:  Diagnosis Date  . Anemia    hx  . Bronchitis   . Bronchitis    hx  . Closed patellar  sleeve fracture of left knee   . Diverticulosis   . GERD (gastroesophageal reflux disease)    occ  . Headache(784.0)    migraines occ  . Hypertension    no meds in over 65yrs  . Seasonal allergies     Past Surgical History:  Procedure Laterality Date  . ABDOMINAL HYSTERECTOMY     69 y/o  . APPENDECTOMY  03/17/2013   Procedure: INCIDENTAL APPENDECTOMY;  Surgeon: Gwenyth Ober, MD;  Location: [facility];  Service: General;;  . capsule endoscopy    . COLON SURGERY    . COLONOSCOPY     in 2012 -   . COLOSTOMY CLOSURE  03/17/2013  . COLOSTOMY REVERSAL  03/17/2013   Procedure: COLOSTOMY REVERSAL;  Surgeon: Gwenyth Ober, MD;  Location: [facility];  Service: General;;  . EUS N/A 04/30/2015   Procedure: UPPER ENDOSCOPIC ULTRASOUND (EUS) LINEAR;  Surgeon: Carol Ada, MD;  Location: [facility];  Service: Endoscopy;  Laterality: N/A;  . HERNIA REPAIR     X2 2-4 y/o inguinal  . LAPAROTOMY N/A 10/17/2012   Procedure: EXPLORATORY LAPAROTOMY,  COLON RESECTION AND COLOSTOMY;  Surgeon: Gwenyth Ober, MD;  Location: [facility];  Service: General;  Laterality: N/A;  . LYSIS OF ADHESION  03/17/2013   Procedure: LYSIS OF ADHESION;  Surgeon: Gwenyth Ober, MD;  Location: [facility];  Service: General;;  . ORIF PATELLA Left 02/20/2020   Procedure: OPEN REDUCTION INTERNAL (ORIF) FIXATION  LEFT PATELLA;  Surgeon: Leandrew Koyanagi, MD;  Location: [place] [facility];  Service: Orthopedics;  Laterality: Left;  . UPPER GASTROINTESTINAL ENDOSCOPY     2013 -    Family History  Problem Relation Age of Onset  . Esophageal cancer Mother        and ovarian cancer  . Alcohol abuse Mother   . Arthritis Mother   . Heart disease Father   . Alcohol abuse Father   . Hypertension Father   . Diabetes Father   . Early death Father   . Colon cancer Other   . Pancreatic cancer Other   . Stroke Neg Hx   . Hyperlipidemia Neg Hx   . Kidney disease Neg Hx   . COPD Neg Hx     Social History   Tobacco Use  . Smoking status:  Never Smoker  . Smokeless tobacco: Never Used  . Tobacco comment: EXPOSED TO SECOND HAND SMOKE   Substance Use Topics  . Alcohol use: Not Currently    Alcohol/week: 2.0 standard drinks    Types: 2 Glasses of wine per week    Subjective:   I connected with Amaura Authier Raju on 04/06/20 at 10:40 AM EST by a telephone call and verified that I am speaking with the correct person using two identifiers.   I discussed the limitations of evaluation and management by telemedicine and the availability of in person appointments. The patient expressed understanding and agreed to proceed. Provider in office/ patient is at home; provider and patient are only 2 people on phone call.   Concerned for bronchitis; notes she "get this every year" and can feel the congestion moving into her chest; is taking her Allegra to help manage allergy symptoms; notes that Z-pak does not work for her- requesting Amoxicillin for treatment; no fever or chest pain; no concerns for COVID exposure;     Objective:  There were no vitals filed for this visit.  Lungs: Respirations unlabored;  Neurologic: Alert and oriented; speech intact;   Assessment:  1. Acute bronchitis, unspecified organism     Plan:  Suspect allergic component; continue Allegra; patient will continue OTC medications for symptom relief- she defers anything stronger at this time; Rx for Amoxcillin 875 mg bid x 10 days; will need to consider COVID testing if symptoms persist; increase fluids, rest and follow up worse, no better.  Time spent 11 minutes   No follow-ups on file.  No orders of the defined types were placed in this encounter.   Requested Prescriptions   Signed Prescriptions Disp Refills  . amoxicillin (AMOXIL) 875 MG tablet 20 tablet 0    Sig: Take 1 tablet (875 mg total) by mouth 2 (two) times daily.

## 2020-04-12 ENCOUNTER — Telehealth: Payer: Self-pay | Admitting: Orthopaedic Surgery

## 2020-04-12 ENCOUNTER — Encounter: Payer: Self-pay | Admitting: Internal Medicine

## 2020-04-12 ENCOUNTER — Other Ambulatory Visit: Payer: Self-pay

## 2020-04-12 ENCOUNTER — Telehealth (INDEPENDENT_AMBULATORY_CARE_PROVIDER_SITE_OTHER): Payer: Medicare Other | Admitting: Internal Medicine

## 2020-04-12 DIAGNOSIS — U071 COVID-19: Secondary | ICD-10-CM | POA: Diagnosis not present

## 2020-04-12 MED ORDER — PREDNISONE 20 MG PO TABS: 40.0000 mg | ORAL_TABLET | Freq: Every day | ORAL | 0 refills | Status: DC

## 2020-04-12 NOTE — Progress Notes (Signed)
Virtual Visit via Video Note  I connected with Alyssa Robinson on 04/12/20 at 10:40 AM EST by a video enabled telemedicine application and verified that I am speaking with the correct person using two identifiers.  The patient and the provider were at separate locations throughout the entire encounter. Patient location: home, Provider location: work   I discussed the limitations of evaluation and management by telemedicine and the availability of in person appointments. The patient expressed understanding and agreed to proceed. The patient and the provider were the only parties present for the visit unless noted in HPI below.  History of Present Illness: The patient is a 69 y.o. female with visit for covid-19 positive. Started last Monday. Has virtual visit and got amoxicillin. She felt a little better with that. She has taken 5 days and is feeling much better. Still in her chest some and some light yellow sputum. Sometimes clear. Was dark yellow before meds. Last Thursday woke up unable to taste and got tested for covid-19 test. Took home covid-19 test Saturday and it was positive mildly. Did another next day and this was still faintly positive. Has had 2 shots for covid-19 but no booster yet. Denies SOB. Has been at home the last week without going anywhere and has canceled holiday plans. Overall it is stable to improving. Has tried amoxicillin. Has had 2 shots covid-19 but no booster yet.  Observations/Objective: Appearance: normal, breathing appears normal, some cough during visit without dyspnea, speaking in full sentences, casual grooming, abdomen does not appear distended, throat not well visualized, memory normal, mental status is A and O times 3  Assessment and Plan: See problem oriented charting  Follow Up Instructions: refer for monoclonal antibody infusion as within 10 days of symptom onset, rx prednisone and continue amoxicillin  I discussed the assessment and treatment plan with the  patient. The patient was provided an opportunity to ask questions and all were answered. The patient agreed with the plan and demonstrated an understanding of the instructions.   The patient was advised to call back or seek an in-person evaluation if the symptoms worsen or if the condition fails to improve as anticipated.  Hoyt Koch, MD

## 2020-04-12 NOTE — Telephone Encounter (Signed)
Pt just had surgery and was wondering if she is allowed to drive. Please give her a call at [phone].

## 2020-04-12 NOTE — Telephone Encounter (Signed)
Pt is s/p ORIF left patella 02/20/20 asking if she is allowed to drive. Please advise.

## 2020-04-12 NOTE — Assessment & Plan Note (Signed)
Symptom onset 04/06/20, test positive 04/10/20. Referred for monoclonal antibody treatment and counseled about need to quarantine and length of this as well as her husband's need to quarantine and test if he becomes symptomatic.

## 2020-04-12 NOTE — Telephone Encounter (Signed)
No note needed 

## 2020-04-12 NOTE — Telephone Encounter (Signed)
Yes she is

## 2020-04-12 NOTE — Telephone Encounter (Signed)
I called and sw pt to advise. Will call with any questions.

## 2020-04-13 ENCOUNTER — Telehealth: Payer: Self-pay | Admitting: Internal Medicine

## 2020-04-13 ENCOUNTER — Telehealth (HOSPITAL_COMMUNITY): Payer: Self-pay | Admitting: Adult Health

## 2020-04-13 NOTE — Telephone Encounter (Signed)
Called patient regarding monoclonal antibody treatment for COVID 19 given to those who are at risk for complications and/or hospitalization of the virus.  Patient meets criteria based on: age.  Her symptoms began on 12/13 to 12/14.    I explained to Lizet that due to our current MAB shortage along with increased demand for the treatment, that we have shortened the window to receive MAB therapy from within 10 days to within 7 days of symptom onset.  Rasheda is outside of the treatment window.   I reassured Ronique that after 7 days of COVID symptoms, the mab therapy isn't as effective as it is prior to the 7 day window.  She has continued to improve daily, and I let her know that I anticipate that she will continue on this trajectory.  She wanted to know if she should get a COVID pcr test.  I recommended against this, as she has classic symptoms, and the antigen tests are typically false negative, not false positive.    We reviewed her quarantine.  I reviewed COVID booster timing with patient.  She was recommended to f/u with her PCP office should she begin to feel worse.  Wilber Bihari, NP

## 2020-04-13 NOTE — Telephone Encounter (Signed)
Pt informed of below.  

## 2020-04-13 NOTE — Telephone Encounter (Signed)
Patient following up on antibody treatment infusion, wanted to know when she would receive a call   Please call the patient at [phone]

## 2020-04-13 NOTE — Telephone Encounter (Signed)
They have a 1-2 day process for calling. Her information has been submitted and received by infusion center.

## 2020-04-15 ENCOUNTER — Telehealth (INDEPENDENT_AMBULATORY_CARE_PROVIDER_SITE_OTHER): Payer: Medicare Other | Admitting: Internal Medicine

## 2020-04-15 ENCOUNTER — Encounter: Payer: Self-pay | Admitting: Internal Medicine

## 2020-04-15 DIAGNOSIS — U071 COVID-19: Secondary | ICD-10-CM

## 2020-04-15 MED ORDER — PREDNISONE 20 MG PO TABS
ORAL_TABLET | ORAL | 0 refills | Status: DC
Start: 2020-04-15 — End: 2020-07-01

## 2020-04-15 NOTE — Assessment & Plan Note (Signed)
She is about 9 days out from symptoms onset at this time and stable symptoms. No true SOB. Will extend prednisone course to 2 weeks to help avoid worsening. She understands to watch for SOB and seek care for SOB at urgent care or ER. She is okay to end quarantine at 10 days if symptoms are improved and not coughing.

## 2020-04-15 NOTE — Progress Notes (Signed)
Virtual Visit via Video Note  I connected with Alyssa Robinson on 04/15/20 at  8:40 AM EST by a video enabled telemedicine application and verified that I am speaking with the correct person using two identifiers.  The patient and the provider were at separate locations throughout the entire encounter. Patient location: home, Provider location: work   I discussed the limitations of evaluation and management by telemedicine and the availability of in person appointments. The patient expressed understanding and agreed to proceed. The patient and the provider were the only parties present for the visit unless noted in HPI below.  History of Present Illness: The patient is a 69 y.o. female with visit for ongoing covid-19 symptoms. Started 04/06/20 and tested positive on 04/10/20. Has congestion still in the chest. Denies SOB with exertion. Mild heaviness in the chest from congestion at bedtime. Using vicks at night time. Still taking amoxicillin and will finish in 1 day. Still taking prednisone and has 2 days left. Denies worsening but feels like there is lack of improvement. We had submitted name for consideration of monoclonal antibody but she was outside current 7 day window.  Observations/Objective: Appearance: normal, minimal coughing during visit, breathing appears normal, causal grooming, abdomen does not appear distended, throat not well visualized, memory normal, mental status is A and O times 3  Assessment and Plan: See problem oriented charting  Follow Up Instructions: extend prednisone course to 2 weeks total, no further antibiotics indicated, advised on symptoms to watch for and when to seek care in ER/urgent care  I discussed the assessment and treatment plan with the patient. The patient was provided an opportunity to ask questions and all were answered. The patient agreed with the plan and demonstrated an understanding of the instructions.   The patient was advised to call back or seek  an in-person evaluation if the symptoms worsen or if the condition fails to improve as anticipated.  Hoyt Koch, MD

## 2020-04-19 ENCOUNTER — Telehealth: Payer: Self-pay | Admitting: Internal Medicine

## 2020-04-19 NOTE — Telephone Encounter (Signed)
  Patient wants to know when she should get the COVID booster. She will be finish taking Prednisone soon, however she still is having nasal congestion  Seeking advice

## 2020-04-21 NOTE — Telephone Encounter (Signed)
Can get booster anytime since she did not get monoclonal antibody treatment.

## 2020-04-21 NOTE — Telephone Encounter (Signed)
Pt informed of below.  

## 2020-04-28 DIAGNOSIS — R269 Unspecified abnormalities of gait and mobility: Secondary | ICD-10-CM | POA: Diagnosis not present

## 2020-04-28 DIAGNOSIS — M25562 Pain in left knee: Secondary | ICD-10-CM | POA: Diagnosis not present

## 2020-04-29 DIAGNOSIS — Z23 Encounter for immunization: Secondary | ICD-10-CM | POA: Diagnosis not present

## 2020-05-06 DIAGNOSIS — M25562 Pain in left knee: Secondary | ICD-10-CM | POA: Diagnosis not present

## 2020-05-06 DIAGNOSIS — R269 Unspecified abnormalities of gait and mobility: Secondary | ICD-10-CM | POA: Diagnosis not present

## 2020-05-11 DIAGNOSIS — M25562 Pain in left knee: Secondary | ICD-10-CM | POA: Diagnosis not present

## 2020-05-11 DIAGNOSIS — R269 Unspecified abnormalities of gait and mobility: Secondary | ICD-10-CM | POA: Diagnosis not present

## 2020-05-13 ENCOUNTER — Other Ambulatory Visit: Payer: Self-pay | Admitting: Gastroenterology

## 2020-05-13 ENCOUNTER — Other Ambulatory Visit: Payer: Self-pay

## 2020-05-13 ENCOUNTER — Ambulatory Visit (INDEPENDENT_AMBULATORY_CARE_PROVIDER_SITE_OTHER): Payer: Medicare Other

## 2020-05-13 ENCOUNTER — Ambulatory Visit (INDEPENDENT_AMBULATORY_CARE_PROVIDER_SITE_OTHER): Payer: Medicare Other | Admitting: Orthopaedic Surgery

## 2020-05-13 DIAGNOSIS — R269 Unspecified abnormalities of gait and mobility: Secondary | ICD-10-CM | POA: Diagnosis not present

## 2020-05-13 DIAGNOSIS — M25562 Pain in left knee: Secondary | ICD-10-CM | POA: Diagnosis not present

## 2020-05-13 DIAGNOSIS — S82042D Displaced comminuted fracture of left patella, subsequent encounter for closed fracture with routine healing: Secondary | ICD-10-CM | POA: Diagnosis not present

## 2020-05-13 DIAGNOSIS — K862 Cyst of pancreas: Secondary | ICD-10-CM

## 2020-05-13 DIAGNOSIS — Z8781 Personal history of (healed) traumatic fracture: Secondary | ICD-10-CM

## 2020-05-13 DIAGNOSIS — Z9889 Other specified postprocedural states: Secondary | ICD-10-CM | POA: Diagnosis not present

## 2020-05-13 NOTE — Progress Notes (Signed)
Post-Op Visit Note   Patient: Alyssa Robinson           Date of Birth: 03-13-1951           MRN: [id number] Visit Date: 05/13/2020 PCP: Hoyt Koch, MD   Assessment & Plan:  Chief Complaint:  Chief Complaint  Patient presents with  . Left Knee - Pain   Visit Diagnoses:  1. S/P ORIF (open reduction internal fixation) fracture     Plan:   [name] is 12 weeks status post ORIF left patella.  She is doing very well has no complaints.  She has gone back to the gym.  Currently doing recumbent bike and water aerobics.  She has some stiffness at times.  Surgical scar is fully healed.  She has excellent range of motion and good quad activation.  Ambulating with a normal gait and pace.  X-rays demonstrate healed patella fracture without any hardware complications.  At this point she has recovered from her injury and she we will continue to go to the gym to get stronger.  From my standpoint we can see her back as needed.  Follow-Up Instructions: Return if symptoms worsen or fail to improve.   Orders:  Orders Placed This Encounter  Procedures  . XR KNEE 3 VIEW LEFT   No orders of the defined types were placed in this encounter.   Imaging: XR KNEE 3 VIEW LEFT  Result Date: 05/13/2020 Healed patella fracture with intact hardware   PMFS History: Patient Active Problem List   Diagnosis Date Noted  . COVID-19 04/12/2020  . Closed displaced transverse fracture of left patella 03/17/2020  . Displaced comminuted fracture of left patella, initial encounter for closed fracture 02/20/2020  . Dizziness 10/14/2019  . Lipoma of abdominal wall 08/04/2019  . Osteopenia 01/28/2019  . Allergic rhinitis 01/28/2019  . History of shingles 01/28/2019  . Thrombocytopenia ([place]) 10/17/2017  . Hyperlipidemia with target LDL less than 130 09/14/2015  . Vaginal atrophy 06/01/2015  . 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 Routine general medical examination at a health care facility 12/11/2012   . Essential hypertension, benign 12/11/2012  . Constipation 10/17/2012   Past Medical History:  Diagnosis Date  . Anemia    hx  . Bronchitis   . Bronchitis    hx  . Closed patellar sleeve fracture of left knee   . Diverticulosis   . GERD (gastroesophageal reflux disease)    occ  . Headache(784.0)    migraines occ  . Hypertension    no meds in over 58yrs  . Seasonal allergies     Family History  Problem Relation Age of Onset  . Esophageal cancer Mother        and ovarian cancer  . Alcohol abuse Mother   . Arthritis Mother   . Heart disease Father   . Alcohol abuse Father   . Hypertension Father   . Diabetes Father   . Early death Father   . Colon cancer Other   . Pancreatic cancer Other   . Stroke Neg Hx   . Hyperlipidemia Neg Hx   . Kidney disease Neg Hx   . COPD Neg Hx     Past Surgical History:  Procedure Laterality Date  . ABDOMINAL HYSTERECTOMY     70 y/o  . APPENDECTOMY  03/17/2013   Procedure: INCIDENTAL APPENDECTOMY;  Surgeon: Gwenyth Ober, MD;  Location: [facility];  Service: General;;  . capsule endoscopy    . COLON SURGERY    .  COLONOSCOPY     in 2012 -   . COLOSTOMY CLOSURE  03/17/2013  . COLOSTOMY REVERSAL  03/17/2013   Procedure: COLOSTOMY REVERSAL;  Surgeon: Gwenyth Ober, MD;  Location: [facility];  Service: General;;  . EUS N/A 04/30/2015   Procedure: UPPER ENDOSCOPIC ULTRASOUND (EUS) LINEAR;  Surgeon: Carol Ada, MD;  Location: [facility];  Service: Endoscopy;  Laterality: N/A;  . HERNIA REPAIR     X2 2-4 y/o inguinal  . LAPAROTOMY N/A 10/17/2012   Procedure: EXPLORATORY LAPAROTOMY,  COLON RESECTION AND COLOSTOMY;  Surgeon: Gwenyth Ober, MD;  Location: [facility];  Service: General;  Laterality: N/A;  . LYSIS OF ADHESION  03/17/2013   Procedure: LYSIS OF ADHESION;  Surgeon: Gwenyth Ober, MD;  Location: [facility];  Service: General;;  . ORIF PATELLA Left 02/20/2020   Procedure: OPEN REDUCTION INTERNAL (ORIF) FIXATION LEFT PATELLA;  Surgeon: Leandrew Koyanagi,  MD;  Location: [facility];  Service: Orthopedics;  Laterality: Left;  . UPPER GASTROINTESTINAL ENDOSCOPY     2013 -   Social History   Occupational History  . Not on file  Tobacco Use  . Smoking status: Never Smoker  . Smokeless tobacco: Never Used  . Tobacco comment: EXPOSED TO SECOND HAND SMOKE   Vaping Use  . Vaping Use: Never used  Substance and Sexual Activity  . Alcohol use: Not Currently    Alcohol/week: 2.0 standard drinks    Types: 2 Glasses of wine per week  . Drug use: No  . Sexual activity: Not Currently    Birth control/protection: Post-menopausal

## 2020-05-17 DIAGNOSIS — R269 Unspecified abnormalities of gait and mobility: Secondary | ICD-10-CM | POA: Diagnosis not present

## 2020-05-17 DIAGNOSIS — M25562 Pain in left knee: Secondary | ICD-10-CM | POA: Diagnosis not present

## 2020-05-29 ENCOUNTER — Other Ambulatory Visit: Payer: Self-pay

## 2020-05-29 ENCOUNTER — Ambulatory Visit
Admission: RE | Admit: 2020-05-29 | Discharge: 2020-05-29 | Disposition: A | Discharge status: Home / Self Care | Payer: Medicare Other | Source: Ambulatory Visit | Attending: Gastroenterology | Admitting: Gastroenterology

## 2020-05-29 DIAGNOSIS — K862 Cyst of pancreas: Secondary | ICD-10-CM | POA: Diagnosis not present

## 2020-05-29 DIAGNOSIS — N281 Cyst of kidney, acquired: Secondary | ICD-10-CM | POA: Diagnosis not present

## 2020-05-29 DIAGNOSIS — K7689 Other specified diseases of liver: Secondary | ICD-10-CM | POA: Diagnosis not present

## 2020-05-29 DIAGNOSIS — Q453 Other congenital malformations of pancreas and pancreatic duct: Secondary | ICD-10-CM | POA: Diagnosis not present

## 2020-05-29 MED ORDER — GADOBENATE DIMEGLUMINE 529 MG/ML IV SOLN
10.0000 mL | Freq: Once | INTRAVENOUS | Status: AC | PRN
  Administered 2020-05-29: 10 mL via INTRAVENOUS

## 2020-06-11 ENCOUNTER — Telehealth: Payer: Self-pay | Admitting: Orthopaedic Surgery

## 2020-06-11 NOTE — Telephone Encounter (Signed)
Pt called stating she's been speaking with billing about a charge that billing believes accurred due to an incorrect code. Pt states she had this problem before and when the code was changed the charge dropped; she would like a CB to discuss further please. Pt states the charges are from X-rays that were taken to see how her knee was healing from a procedure our office did.   [phone]

## 2020-06-12 DIAGNOSIS — Z1231 Encounter for screening mammogram for malignant neoplasm of breast: Secondary | ICD-10-CM | POA: Diagnosis not present

## 2020-06-21 ENCOUNTER — Telehealth: Payer: Self-pay | Admitting: Internal Medicine

## 2020-06-21 DIAGNOSIS — H524 Presbyopia: Secondary | ICD-10-CM | POA: Diagnosis not present

## 2020-06-21 DIAGNOSIS — H5203 Hypermetropia, bilateral: Secondary | ICD-10-CM | POA: Diagnosis not present

## 2020-06-21 DIAGNOSIS — H2513 Age-related nuclear cataract, bilateral: Secondary | ICD-10-CM | POA: Diagnosis not present

## 2020-06-21 DIAGNOSIS — H40013 Open angle with borderline findings, low risk, bilateral: Secondary | ICD-10-CM | POA: Diagnosis not present

## 2020-06-21 DIAGNOSIS — H52203 Unspecified astigmatism, bilateral: Secondary | ICD-10-CM | POA: Diagnosis not present

## 2020-06-21 NOTE — Telephone Encounter (Signed)
LVM for pt to rtn my call to schedule AWV with [facility]. Please schedule appt if pt calls the office.  

## 2020-07-01 ENCOUNTER — Ambulatory Visit (INDEPENDENT_AMBULATORY_CARE_PROVIDER_SITE_OTHER): Payer: Medicare Other | Admitting: Internal Medicine

## 2020-07-01 ENCOUNTER — Ambulatory Visit (INDEPENDENT_AMBULATORY_CARE_PROVIDER_SITE_OTHER): Payer: Medicare Other

## 2020-07-01 ENCOUNTER — Encounter: Payer: Self-pay | Admitting: Internal Medicine

## 2020-07-01 ENCOUNTER — Other Ambulatory Visit: Payer: Self-pay

## 2020-07-01 VITALS — BP 120/78 | HR 60 | Temp 98.1°F | Resp 18 | Ht 59.0 in | Wt 110.4 lb

## 2020-07-01 VITALS — BP 120/78 | HR 60 | Temp 98.1°F | Ht 59.0 in | Wt 110.4 lb

## 2020-07-01 DIAGNOSIS — Z8249 Family history of ischemic heart disease and other diseases of the circulatory system: Secondary | ICD-10-CM

## 2020-07-01 DIAGNOSIS — I10 Essential (primary) hypertension: Secondary | ICD-10-CM | POA: Diagnosis not present

## 2020-07-01 DIAGNOSIS — D696 Thrombocytopenia, unspecified: Secondary | ICD-10-CM | POA: Diagnosis not present

## 2020-07-01 DIAGNOSIS — Z0000 Encounter for general adult medical examination without abnormal findings: Secondary | ICD-10-CM | POA: Diagnosis not present

## 2020-07-01 DIAGNOSIS — E559 Vitamin D deficiency, unspecified: Secondary | ICD-10-CM

## 2020-07-01 DIAGNOSIS — M8580 Other specified disorders of bone density and structure, unspecified site: Secondary | ICD-10-CM

## 2020-07-01 DIAGNOSIS — K862 Cyst of pancreas: Secondary | ICD-10-CM

## 2020-07-01 DIAGNOSIS — E785 Hyperlipidemia, unspecified: Secondary | ICD-10-CM

## 2020-07-01 LAB — COMPREHENSIVE METABOLIC PANEL
ALT: 15 U/L (ref 0–35)
AST: 23 U/L (ref 0–37)
Albumin: 4.4 g/dL (ref 3.5–5.2)
Alkaline Phosphatase: 85 U/L (ref 39–117)
BUN: 15 mg/dL (ref 6–23)
CO2: 29 mEq/L (ref 19–32)
Calcium: 9.6 mg/dL (ref 8.4–10.5)
Chloride: 107 mEq/L (ref 96–112)
Creatinine, Ser: 0.73 mg/dL (ref 0.40–1.20)
GFR: 82.89 mL/min (ref >60.00)
Glucose, Bld: 92 mg/dL (ref 70–99)
Potassium: 4.6 mEq/L (ref 3.5–5.1)
Sodium: 141 mEq/L (ref 135–145)
Total Bilirubin: 1 mg/dL (ref 0.2–1.2)
Total Protein: 7.4 g/dL (ref 6.0–8.3)

## 2020-07-01 LAB — CBC
HCT: 44.8 % (ref 36.0–46.0)
Hemoglobin: 15.3 g/dL — ABNORMAL HIGH (ref 12.0–15.0)
MCHC: 34.1 g/dL (ref 30.0–36.0)
MCV: 97.6 fl (ref 78.0–100.0)
Platelets: 145 10*3/uL — ABNORMAL LOW (ref 150.0–400.0)
RBC: 4.59 Mil/uL (ref 3.87–5.11)
RDW: 14.2 % (ref 11.5–15.5)
WBC: 3.7 10*3/uL — ABNORMAL LOW (ref 4.0–10.5)

## 2020-07-01 LAB — LIPID PANEL
Cholesterol: 212 mg/dL — ABNORMAL HIGH (ref 0–200)
HDL: 81.1 mg/dL (ref >39.00)
LDL Cholesterol: 118 mg/dL — ABNORMAL HIGH (ref 0–99)
NonHDL: 130.52
Total CHOL/HDL Ratio: 3
Triglycerides: 62 mg/dL (ref 0.0–149.0)
VLDL: 12.4 mg/dL (ref 0.0–40.0)

## 2020-07-01 LAB — TSH: TSH: 2.16 u[IU]/mL (ref 0.35–4.50)

## 2020-07-01 LAB — VITAMIN D 25 HYDROXY (VIT D DEFICIENCY, FRACTURES): VITD: 53.27 ng/mL (ref 30.00–100.00)

## 2020-07-01 NOTE — Progress Notes (Signed)
Subjective:   Alyssa Robinson is a 70 y.o. female who presents for Medicare Annual (Subsequent) preventive examination.  Review of Systems    No ROS. Medicare Wellness Visit. Additional risk factors are reflected in social history. Cardiac Risk Factors include: advanced age (>28men, >70 women);dyslipidemia;family history of premature cardiovascular disease     Objective:    Today's Vitals   07/01/20 0802 07/01/20 0805  BP: 120/78   Pulse: 60   Temp: 98.1 F (36.7 C)   SpO2: 93%   Weight: 110 lb 6.4 oz (50.1 kg)   Height: 4\' 11"  (1.499 m)   PainSc:  0-No pain   Body mass index is 22.3 kg/m.  Advanced Directives 07/01/2020 02/20/2020 02/17/2020 02/14/2020 06/09/2018 09/25/2017 09/19/2016  Does Patient Have a Medical Advance Directive? Yes Yes Yes Yes No Yes Yes  Type of Advance Directive Living will;Healthcare Power of Attorney - Living will;Healthcare Power of [place];Living will - [place];Living will [place];Living will  Does patient want to make changes to medical advance directive? No - Patient declined No - Patient declined No - Patient declined - - - -  Copy of [place] in Chart? No - copy requested - No - copy requested - - No - copy requested No - copy requested  Would patient like information on creating a medical advance directive? - - - - No - Patient declined - -    Current Medications (verified) Outpatient Encounter Medications as of 07/01/2020  Medication Sig  . amoxicillin (AMOXIL) 500 MG tablet TAKE 4 TABS PO 3 MINS PRIOR TO PROCEDURE.  . calcium-vitamin D (OSCAL WITH D) 500-200 MG-UNIT tablet Take 1 tablet by mouth 3 (three) times daily.  . ciclopirox (LOPROX) 0.77 % SUSP Apply topically 2 (two) times daily.  . fexofenadine (ALLEGRA) 180 MG tablet Take 180 mg by mouth daily as needed for allergies.   . Pediatric Multiple Vit-C-FA (PEDIATRIC MULTIVITAMIN) chewable tablet  Chew by mouth.  . Probiotic Product ([place]) 170 MG CAPS Take by mouth.  . sodium chloride (OCEAN) 0.65 % SOLN nasal spray Place 1 spray into both nostrils as needed for congestion.  . Tart Cherry (TART CHERRY ULTRA) 1200 MG CAPS Take 1 capsule by mouth daily. Take with meal before bedtime  . amoxicillin (AMOXIL) 875 MG tablet Take 1 tablet (875 mg total) by mouth 2 (two) times daily.  Mariane Baumgarten Sodium (COLACE PO) Take by mouth. (Patient not taking: Reported on 07/01/2020)  . predniSONE (DELTASONE) 20 MG tablet Day 1-3 take 2 pills daily. Days 4-11 take 1 pill daily.   No facility-administered encounter medications on file as of 07/01/2020.    Allergies (verified) Caffeine and Sodium lauryl sulfate   History: Past Medical History:  Diagnosis Date  . Anemia    hx  . Bronchitis   . Bronchitis    hx  . Closed patellar sleeve fracture of left knee   . Diverticulosis   . GERD (gastroesophageal reflux disease)    occ  . Headache(784.0)    migraines occ  . Hypertension    no meds in over 5yrs  . Seasonal allergies    Past Surgical History:  Procedure Laterality Date  . ABDOMINAL HYSTERECTOMY     70 y/o  . APPENDECTOMY  03/17/2013   Procedure: INCIDENTAL APPENDECTOMY;  Surgeon: Gwenyth Ober, MD;  Location: [facility];  Service: General;;  . capsule endoscopy    . COLON  SURGERY    . COLONOSCOPY     in 2012 -   . COLOSTOMY CLOSURE  03/17/2013  . COLOSTOMY REVERSAL  03/17/2013   Procedure: COLOSTOMY REVERSAL;  Surgeon: Gwenyth Ober, MD;  Location: [facility];  Service: General;;  . EUS N/A 04/30/2015   Procedure: UPPER ENDOSCOPIC ULTRASOUND (EUS) LINEAR;  Surgeon: Carol Ada, MD;  Location: [facility];  Service: Endoscopy;  Laterality: N/A;  . HERNIA REPAIR     X2 2-4 y/o inguinal  . LAPAROTOMY N/A 10/17/2012   Procedure: EXPLORATORY LAPAROTOMY,  COLON RESECTION AND COLOSTOMY;  Surgeon: Gwenyth Ober, MD;  Location: [facility];  Service: General;  Laterality: N/A;  .  LYSIS OF ADHESION  03/17/2013   Procedure: LYSIS OF ADHESION;  Surgeon: Gwenyth Ober, MD;  Location: [facility];  Service: General;;  . ORIF PATELLA Left 02/20/2020   Procedure: OPEN REDUCTION INTERNAL (ORIF) FIXATION LEFT PATELLA;  Surgeon: Leandrew Koyanagi, MD;  Location: [facility];  Service: Orthopedics;  Laterality: Left;  . UPPER GASTROINTESTINAL ENDOSCOPY     2013 -   Family History  Problem Relation Age of Onset  . Esophageal cancer Mother        and ovarian cancer  . Alcohol abuse Mother   . Arthritis Mother   . Heart disease Father   . Alcohol abuse Father   . Hypertension Father   . Diabetes Father   . Early death Father   . Colon cancer Other   . Pancreatic cancer Other   . Stroke Neg Hx   . Hyperlipidemia Neg Hx   . Kidney disease Neg Hx   . COPD Neg Hx    Social History   Socioeconomic History  . Marital status: Married    Spouse name: Not on file  . Number of children: Not on file  . Years of education: Not on file  . Highest education level: Not on file  Occupational History  . Not on file  Tobacco Use  . Smoking status: Never Smoker  . Smokeless tobacco: Never Used  . Tobacco comment: EXPOSED TO SECOND HAND SMOKE   Vaping Use  . Vaping Use: Never used  Substance and Sexual Activity  . Alcohol use: Not Currently    Alcohol/week: 2.0 standard drinks    Types: 2 Glasses of wine per week  . Drug use: No  . Sexual activity: Not Currently    Birth control/protection: Post-menopausal  Other Topics Concern  . Not on file  Social History Narrative  . Not on file   Social Determinants of Health   Financial Resource Strain: Low Risk   . Difficulty of Paying Living Expenses: Not hard at all  Food Insecurity: No Food Insecurity  . Worried About Charity fundraiser in the Last Year: Never true  . Ran Out of Food in the Last Year: Never true  Transportation Needs: No Transportation Needs  . Lack of Transportation (Medical): No  . Lack of  Transportation (Non-Medical): No  Physical Activity: Sufficiently Active  . Days of Exercise per Week: 7 days  . Minutes of Exercise per Session: 30 min  Stress: No Stress Concern Present  . Feeling of Stress : Not at all  Social Connections: Socially Integrated  . Frequency of Communication with Friends and Family: More than three times a week  . Frequency of Social Gatherings with Friends and Family: More than three times a week  . Attends Religious Services: More than 4 times per  year  . Active Member of Clubs or Organizations: Yes  . Attends Archivist Meetings: More than 4 times per year  . Marital Status: Married    Tobacco Counseling Counseling given: Not Answered Comment: EXPOSED TO SECOND HAND SMOKE    Clinical Intake:  Pre-visit preparation completed: Yes  Pain : No/denies pain Pain Score: 0-No pain     BMI - recorded: 22.3 Nutritional Status: BMI of 19-24  Normal Nutritional Risks: None Diabetes: No  How often do you need to have someone help you when you read instructions, pamphlets, or other written materials from your doctor or pharmacy?: 1 - Never What is the last grade level you completed in school?: Bachelor's Degree  Diabetic? no  Interpreter Needed?: No  Information entered by :: Lisette Abu, LPN   Activities of Daily Living In your present state of health, do you have any difficulty performing the following activities: 07/01/2020 02/20/2020  Hearing? N N  Vision? N N  Difficulty concentrating or making decisions? N N  Walking or climbing stairs? N N  Dressing or bathing? N N  Doing errands, shopping? N -  Preparing Food and eating ? N -  Using the Toilet? N -  In the past six months, have you accidently leaked urine? N -  Do you have problems with loss of bowel control? N -  Managing your Medications? N -  Housekeeping or managing your Housekeeping? N -  Some recent data might be hidden    Patient Care Team: Hoyt Koch, MD as PCP - General (Internal Medicine)  Indicate any recent Medical Services you may have received from other than [facility] providers in the past year (date may be approximate).     Assessment:   This is a routine wellness examination for [place].  Hearing/Vision screen No exam data present  Dietary issues and exercise activities discussed: Current Exercise Habits: Home exercise routine, Type of exercise: walking;strength training/weights;stretching;Other - see comments (8,000-11,000 steps daily; golf, uses 2 lb weights, etc.), Time (Minutes): 60, Frequency (Times/Week): 7, Weekly Exercise (Minutes/Week): 420, Intensity: Moderate  Goals    . Maintain current health status     Continue to enjoy life and family.    . Patient Stated     Stay as healthy and as independent as possible, enjoy life, family, friends, and travel.       Depression Screen PHQ 2/9 Scores 07/01/2020 01/28/2019 10/17/2017 09/25/2017 09/19/2016 09/15/2015 11/04/2014  PHQ - 2 Score 0 0 0 0 0 0 0  PHQ- 9 Score - - - - 1 - -    Fall Risk Fall Risk  07/01/2020 08/04/2019 01/28/2019 11/21/2017 10/17/2017  Falls in the past year? 1 0 1 No No  Comment - - - Emmi Telephone Survey: data to providers prior to load -  Number falls in past yr: 0 0 0 - -  Injury with Fall? 1 0 0 - -  Risk for fall due to : Other (Comment) - - - -  Risk for fall due to: Comment tripped on uneven sidewalk; left patella fracture - - - -  Follow up Falls evaluation completed - - - -    FALL RISK PREVENTION PERTAINING TO THE HOME:  Any stairs in or around the home? Yes  If so, are there any without handrails? No  Home free of loose throw rugs in walkways, pet beds, electrical cords, etc? Yes  Adequate lighting in your home to reduce risk of falls? Yes  ASSISTIVE DEVICES UTILIZED TO PREVENT FALLS:  Life alert? Yes  Use of a cane, walker or w/c? No  Grab bars in the bathroom? Yes  Shower chair or bench in shower? Yes  Elevated toilet  seat or a handicapped toilet? Yes   TIMED UP AND GO:  Was the test performed? No .  Length of time to ambulate 10 feet: 0 sec.   Gait steady and fast without use of assistive device  Cognitive Function: Normal cognitive status assessed by direct observation by this Nurse Health Advisor. No abnormalities found.          Immunizations Immunization History  Administered Date(s) Administered  . Influenza, High Dose Seasonal PF 12/16/2018  . Influenza, Quadrivalent, Recombinant, Inj, Pf 01/30/2018  . Influenza,inj,Quad PF,6+ Mos 03/25/2020  . PFIZER(Purple Top)SARS-COV-2 Vaccination 05/31/2019, 06/25/2019, 04/29/2020  . Pneumococcal Conjugate-13 05/13/2014  . Pneumococcal Polysaccharide-23 09/14/2015  . Rabies, IM 12/07/2011, 12/10/2011, 12/14/2011, 12/21/2011  . Rabies, intradermal 12/07/2011  . Tdap 11/23/2005, 09/14/2015  . Zoster 11/11/2009  . Zoster Recombinat (Shingrix) 02/18/2019, 04/30/2019    TDAP status: Up to date  Flu Vaccine status: Up to date  Pneumococcal vaccine status: Up to date  Covid-19 vaccine status: Completed vaccines  Qualifies for Shingles Vaccine? Yes   Zostavax completed Yes   Shingrix Completed?: Yes  Screening Tests Health Maintenance  Topic Date Due  . MAMMOGRAM  06/12/2022  . TETANUS/TDAP  09/13/2025  . COLONOSCOPY (Pts 45-84yrs Insurance coverage will need to be confirmed)  12/10/2028  . INFLUENZA VACCINE  Completed  . DEXA SCAN  Completed  . COVID-19 Vaccine  Completed  . Hepatitis C Screening  Completed  . PNA vac Low Risk Adult  Completed  . HPV VACCINES  Aged Out    Health Maintenance  There are no preventive care reminders to display for this patient.  Colorectal cancer screening: No longer required.   Mammogram status: Completed 06/12/2020. Repeat every year  Bone Density status: Completed 01/28/2019. Results reflect: Bone density results: OSTEOPENIA. Repeat every 2 years.  Lung Cancer Screening: (Low Dose CT Chest  recommended if Age 78-80 years, 30 pack-year currently smoking OR have quit w/in 15years.) does not qualify.   Lung Cancer Screening Referral: no  Additional Screening:  Hepatitis C Screening: does qualify; Completed yes  Vision Screening: Recommended annual ophthalmology exams for early detection of glaucoma and other disorders of the eye. Is the patient up to date with their annual eye exam?  Yes  Who is the provider or what is the name of the office in which the patient attends annual eye exams? [employer], OD. If pt is not established with a provider, would they like to be referred to a provider to establish care? No .   Dental Screening: Recommended annual dental exams for proper oral hygiene  Community Resource Referral / Chronic Care Management: CRR required this visit?  No   CCM required this visit?  No      Plan:     I have personally reviewed and noted the following in the patient's chart:   . Medical and social history . Use of alcohol, tobacco or illicit drugs  . Current medications and supplements . Functional ability and status . Nutritional status . Physical activity . Advanced directives . List of other physicians . Hospitalizations, surgeries, and ER visits in previous 12 months . Vitals . Screenings to include cognitive, depression, and falls . Referrals and appointments  In addition, I have reviewed and discussed with patient certain  preventive protocols, quality metrics, and best practice recommendations. A written personalized care plan for preventive services as well as general preventive health recommendations were provided to patient.     Sheral Flow, LPN   [id number]   Nurse Notes:  Medications reviewed with patient; no opioid use noted.

## 2020-07-01 NOTE — Progress Notes (Unsigned)
   Subjective:   Patient ID: Alyssa Robinson, female    DOB: 02-25-1951, 70 y.o.   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   Review of Systems  Constitutional: Negative.   HENT: Negative.   Eyes: Negative.   Respiratory: Negative for cough, chest tightness and shortness of breath.   Cardiovascular: Negative for chest pain, palpitations and leg swelling.  Gastrointestinal: Negative for abdominal distention, abdominal pain, constipation, diarrhea, nausea and vomiting.  Musculoskeletal: Negative.   Skin: Negative.   Neurological: Negative.   Psychiatric/Behavioral: Negative.     Objective:  Physical Exam Constitutional:      Appearance: She is well-developed.  HENT:     Head: Normocephalic and atraumatic.  Cardiovascular:     Rate and Rhythm: Normal rate and regular rhythm.  Pulmonary:     Effort: Pulmonary effort is normal. No respiratory distress.     Breath sounds: Normal breath sounds. No wheezing or rales.  Abdominal:     General: Bowel sounds are normal. There is no distension.     Palpations: Abdomen is soft.     Tenderness: There is no abdominal tenderness. There is no rebound.  Musculoskeletal:     Cervical back: Normal range of motion.  Skin:    General: Skin is warm and dry.  Neurological:     Mental Status: She is alert and oriented to person, place, and time.     Coordination: Coordination normal.     Vitals:   07/01/20 0852  BP: 120/78  Pulse: 60  Resp: 18  Temp: 98.1 F (36.7 C)   TempSrc: Oral  SpO2: 93%  Weight: 110 lb 6.4 oz (50.1 kg)  Height: 4\' 11"  (1.499 m)   This visit occurred during the SARS-CoV-2 public health emergency.  Safety protocols were in place, including screening questions prior to the visit, additional usage of staff PPE, and extensive cleaning of exam room while observing appropriate contact time as indicated for disinfecting solutions.   Assessment & Plan:

## 2020-07-01 NOTE — Patient Instructions (Signed)
We will check the labs today. 

## 2020-07-01 NOTE — Patient Instructions (Addendum)
Alyssa Robinson , Thank you for taking time to come for your Medicare Wellness Visit. I appreciate your ongoing commitment to your health goals. Please review the following plan we discussed and let me know if I can assist you in the future.   Screening recommendations/referrals: Colonoscopy: 12/11/2018; Per Dr. Collene Mares no longer needed Mammogram: 06/12/2020 Bone Density: 01/28/2019; due every 2 years Recommended yearly ophthalmology/optometry visit for glaucoma screening and checkup Recommended yearly dental visit for hygiene and checkup  Vaccinations: Influenza vaccine: 03/25/2020 Pneumococcal vaccine: 05/13/2014, 09/14/2015 Tdap vaccine: 09/14/2015 Shingles vaccine: 02/18/2019, 04/30/2019  Covid-19: 05/31/2019, 06/25/2019, 04/29/2020  Advanced directives: Please bring a copy of your health care power of attorney and living will to the office at your convenience.  Conditions/risks identified: Yes; Reviewed health maintenance screenings with patient today and relevant education, vaccines, and/or referrals were provided. Please continue to do your personal lifestyle choices by: daily care of teeth and gums, regular physical activity (goal should be 5 days a week for 30 minutes), eat a healthy diet, avoid tobacco and drug use, limiting any alcohol intake, taking a low-dose aspirin (if not allergic or have been advised by your provider otherwise) and taking vitamins and minerals as recommended by your provider. Continue doing brain stimulating activities (puzzles, reading, adult coloring books, staying active) to keep memory sharp. Continue to eat heart healthy diet (full of fruits, vegetables, whole grains, lean protein, water--limit salt, fat, and sugar intake) and increase physical activity as tolerated.  Next appointment: Please schedule your next Medicare Wellness Visit with your Nurse Health Advisor in 1 year by calling [phone].   Preventive Care 26 Years and Older, Female Preventive care refers to  lifestyle choices and visits with your health care provider that can promote health and wellness. What does preventive care include?  A yearly physical exam. This is also called an annual well check.  Dental exams once or twice a year.  Routine eye exams. Ask your health care provider how often you should have your eyes checked.  Personal lifestyle choices, including:  Daily care of your teeth and gums.  Regular physical activity.  Eating a healthy diet.  Avoiding tobacco and drug use.  Limiting alcohol use.  Practicing safe sex.  Taking low-dose aspirin every day.  Taking vitamin and mineral supplements as recommended by your health care provider. What happens during an annual well check? The services and screenings done by your health care provider during your annual well check will depend on your age, overall health, lifestyle risk factors, and family history of disease. Counseling  Your health care provider may ask you questions about your:  Alcohol use.  Tobacco use.  Drug use.  Emotional well-being.  Home and relationship well-being.  Sexual activity.  Eating habits.  History of falls.  Memory and ability to understand (cognition).  Work and work Statistician.  Reproductive health. Screening  You may have the following tests or measurements:  Height, weight, and BMI.  Blood pressure.  Lipid and cholesterol levels. These may be checked every 5 years, or more frequently if you are over 89 years old.  Skin check.  Lung cancer screening. You may have this screening every year starting at age 62 if you have a 30-pack-year history of smoking and currently smoke or have quit within the past 15 years.  Fecal occult blood test (FOBT) of the stool. You may have this test every year starting at age 23.  Flexible sigmoidoscopy or colonoscopy. You may have a sigmoidoscopy every 5  years or a colonoscopy every 10 years starting at age 75.  Hepatitis C blood  test.  Hepatitis B blood test.  Sexually transmitted disease (STD) testing.  Diabetes screening. This is done by checking your blood sugar (glucose) after you have not eaten for a while (fasting). You may have this done every 1-3 years.  Bone density scan. This is done to screen for osteoporosis. You may have this done starting at age 65.  Mammogram. This may be done every 1-2 years. Talk to your health care provider about how often you should have regular mammograms. Talk with your health care provider about your test results, treatment options, and if necessary, the need for more tests. Vaccines  Your health care provider may recommend certain vaccines, such as:  Influenza vaccine. This is recommended every year.  Tetanus, diphtheria, and acellular pertussis (Tdap, Td) vaccine. You may need a Td booster every 10 years.  Zoster vaccine. You may need this after age 49.  Pneumococcal 13-valent conjugate (PCV13) vaccine. One dose is recommended after age 32.  Pneumococcal polysaccharide (PPSV23) vaccine. One dose is recommended after age 67. Talk to your health care provider about which screenings and vaccines you need and how often you need them. This information is not intended to replace advice given to you by your health care provider. Make sure you discuss any questions you have with your health care provider. Document Released: 05/07/2015 Document Revised: 12/29/2015 Document Reviewed: 02/09/2015 Elsevier Interactive Patient Education  2017 [employer] Prevention in the Home Falls can cause injuries. They can happen to people of all ages. There are many things you can do to make your home safe and to help prevent falls. What can I do on the outside of my home?  Regularly fix the edges of walkways and driveways and fix any cracks.  Remove anything that might make you trip as you walk through a door, such as a raised step or threshold.  Trim any bushes or trees on the  path to your home.  Use bright outdoor lighting.  Clear any walking paths of anything that might make someone trip, such as rocks or tools.  Regularly check to see if handrails are loose or broken. Make sure that both sides of any steps have handrails.  Any raised decks and porches should have guardrails on the edges.  Have any leaves, snow, or ice cleared regularly.  Use sand or salt on walking paths during winter.  Clean up any spills in your garage right away. This includes oil or grease spills. What can I do in the bathroom?  Use night lights.  Install grab bars by the toilet and in the tub and shower. Do not use towel bars as grab bars.  Use non-skid mats or decals in the tub or shower.  If you need to sit down in the shower, use a plastic, non-slip stool.  Keep the floor dry. Clean up any water that spills on the floor as soon as it happens.  Remove soap buildup in the tub or shower regularly.  Attach bath mats securely with double-sided non-slip rug tape.  Do not have throw rugs and other things on the floor that can make you trip. What can I do in the bedroom?  Use night lights.  Make sure that you have a light by your bed that is easy to reach.  Do not use any sheets or blankets that are too big for your bed. They should not hang  down onto the floor.  Have a firm chair that has side arms. You can use this for support while you get dressed.  Do not have throw rugs and other things on the floor that can make you trip. What can I do in the kitchen?  Clean up any spills right away.  Avoid walking on wet floors.  Keep items that you use a lot in easy-to-reach places.  If you need to reach something above you, use a strong step stool that has a grab bar.  Keep electrical cords out of the way.  Do not use floor polish or wax that makes floors slippery. If you must use wax, use non-skid floor wax.  Do not have throw rugs and other things on the floor that can  make you trip. What can I do with my stairs?  Do not leave any items on the stairs.  Make sure that there are handrails on both sides of the stairs and use them. Fix handrails that are broken or loose. Make sure that handrails are as long as the stairways.  Check any carpeting to make sure that it is firmly attached to the stairs. Fix any carpet that is loose or worn.  Avoid having throw rugs at the top or bottom of the stairs. If you do have throw rugs, attach them to the floor with carpet tape.  Make sure that you have a light switch at the top of the stairs and the bottom of the stairs. If you do not have them, ask someone to add them for you. What else can I do to help prevent falls?  Wear shoes that:  Do not have high heels.  Have rubber bottoms.  Are comfortable and fit you well.  Are closed at the toe. Do not wear sandals.  If you use a stepladder:  Make sure that it is fully opened. Do not climb a closed stepladder.  Make sure that both sides of the stepladder are locked into place.  Ask someone to hold it for you, if possible.  Clearly mark and make sure that you can see:  Any grab bars or handrails.  First and last steps.  Where the edge of each step is.  Use tools that help you move around (mobility aids) if they are needed. These include:  Canes.  Walkers.  Scooters.  Crutches.  Turn on the lights when you go into a dark area. Replace any light bulbs as soon as they burn out.  Set up your furniture so you have a clear path. Avoid moving your furniture around.  If any of your floors are uneven, fix them.  If there are any pets around you, be aware of where they are.  Review your medicines with your doctor. Some medicines can make you feel dizzy. This can increase your chance of falling. Ask your doctor what other things that you can do to help prevent falls. This information is not intended to replace advice given to you by your health care  provider. Make sure you discuss any questions you have with your health care provider. Document Released: 02/04/2009 Document Revised: 09/16/2015 Document Reviewed: 05/15/2014 Elsevier Interactive Patient Education  2017 [employer].

## 2020-07-02 DIAGNOSIS — Z8249 Family history of ischemic heart disease and other diseases of the circulatory system: Secondary | ICD-10-CM | POA: Insufficient documentation

## 2020-07-02 NOTE — Assessment & Plan Note (Signed)
Ordered CT calcium score to assess risk as cholesterol is borderline and if high score she would be willing to be more aggressive with cholesterol.

## 2020-07-02 NOTE — Assessment & Plan Note (Signed)
Checking lipid panel and discussed 10 year risk with her.

## 2020-07-02 NOTE — Assessment & Plan Note (Signed)
Checking CBC.  °

## 2020-07-02 NOTE — Assessment & Plan Note (Signed)
Reviewed MR results with her and she is due 1 additional follow up MRI for total of 9 years surveillance.

## 2020-07-02 NOTE — Assessment & Plan Note (Signed)
BP at goal off meds. Continue with close monitoring given family history of CAD.

## 2020-07-15 DIAGNOSIS — D485 Neoplasm of uncertain behavior of skin: Secondary | ICD-10-CM | POA: Diagnosis not present

## 2020-07-15 DIAGNOSIS — L820 Inflamed seborrheic keratosis: Secondary | ICD-10-CM | POA: Diagnosis not present

## 2020-08-02 ENCOUNTER — Other Ambulatory Visit: Payer: Self-pay

## 2020-08-02 ENCOUNTER — Ambulatory Visit (INDEPENDENT_AMBULATORY_CARE_PROVIDER_SITE_OTHER)
Admission: RE | Admit: 2020-08-02 | Discharge: 2020-08-02 | Disposition: A | Discharge status: Home / Self Care | Payer: Self-pay | Source: Ambulatory Visit | Attending: Internal Medicine | Admitting: Internal Medicine

## 2020-08-02 DIAGNOSIS — I10 Essential (primary) hypertension: Secondary | ICD-10-CM

## 2020-12-21 ENCOUNTER — Encounter: Payer: Self-pay | Admitting: Orthopaedic Surgery

## 2020-12-21 ENCOUNTER — Other Ambulatory Visit: Payer: Self-pay

## 2020-12-21 ENCOUNTER — Ambulatory Visit (INDEPENDENT_AMBULATORY_CARE_PROVIDER_SITE_OTHER): Payer: Medicare Other | Admitting: Orthopaedic Surgery

## 2020-12-21 ENCOUNTER — Ambulatory Visit (INDEPENDENT_AMBULATORY_CARE_PROVIDER_SITE_OTHER): Payer: Medicare Other

## 2020-12-21 DIAGNOSIS — M545 Low back pain, unspecified: Secondary | ICD-10-CM | POA: Diagnosis not present

## 2020-12-21 MED ORDER — PREDNISONE 10 MG (21) PO TBPK: ORAL_TABLET | ORAL | 0 refills | Status: DC

## 2020-12-21 NOTE — Progress Notes (Signed)
Office Visit Note   Patient: Alyssa Robinson           Date of Birth: [birth year]           MRN: [id number] Visit Date: 12/21/2020              Requested by: Hoyt Koch, MD [address] [place],  [place] [address] PCP: Hoyt Koch, MD   Assessment & Plan: Visit Diagnoses:  1. Low back pain, unspecified back pain laterality, unspecified chronicity, unspecified whether sciatica present     Plan: Based on findings impression is degenerative lumbar disease versus piriformis syndrome.  We discussed treatment options and she would like to start off with a course of outpatient PT and a prednisone Dosepak.  She will let me know if she does not feel any better from these treatments.  Follow-Up Instructions: Return if symptoms worsen or fail to improve.   Orders:  Orders Placed This Encounter  Procedures   XR Lumbar Spine 2-3 Views   Ambulatory referral to Physical Therapy   Meds ordered this encounter  Medications   predniSONE (STERAPRED UNI-PAK 21 TAB) 10 MG (21) TBPK tablet    Sig: Take as directed    Dispense:  21 tablet    Refill:  0      Procedures: No procedures performed   Clinical Data: No additional findings.   Subjective: Chief Complaint  Patient presents with   Alyssa Robinson is a very pleasant female who comes in for evaluation right leg and low back pain.  She recently traveled to [place] for family and she feels that over time she has gotten worsening pain in the low back and right leg with occasional numbness and tingling in the foot.  She feels aching burning pain in the thigh back and the pain is worse with activity.  She is very active and plays with golf on a regular basis.  She has had some relief from deep tissue massages.  Denies any bowel or bladder dysfunction.   Review of Systems  Constitutional: Negative.   HENT: Negative.    Eyes: Negative.   Respiratory: Negative.    Cardiovascular: Negative.    Endocrine: Negative.   Musculoskeletal: Negative.   Neurological: Negative.   Hematological: Negative.   Psychiatric/Behavioral: Negative.    All other systems reviewed and are negative.   Objective: Vital Signs: There were no vitals taken for this visit.  Physical Exam Vitals and nursing note reviewed.  Constitutional:      Appearance: She is well-developed.  Pulmonary:     Effort: Pulmonary effort is normal.  Skin:    General: Skin is warm.     Capillary Refill: Capillary refill takes less than 2 seconds.  Neurological:     Mental Status: She is alert and oriented to person, place, and time.  Psychiatric:        Behavior: Behavior normal.        Thought Content: Thought content normal.        Judgment: Judgment normal.    Ortho Exam Right lower extremity shows no symptoms when moving the hip joint.  She has some tenderness to the posterior lateral aspect of the proximal femur.  Negative FABER sign.  Slight tenderness to the paraspinous muscles of the lumbar spine on the right side. Specialty Comments:  No specialty comments available.  Imaging: XR Lumbar Spine 2-3 Views  Result Date: 12/21/2020 Multilevel degenerative disc disease.  Autofusion of L4-5.  Lumbar spondylosis.    PMFS History: Patient Active Problem List   Diagnosis Date Noted   Family history of early CAD 07/02/2020   Displaced comminuted fracture of left patella, initial encounter for closed fracture 02/20/2020   Lipoma of abdominal wall 08/04/2019   Osteopenia 01/28/2019   Allergic rhinitis 01/28/2019   History of shingles 01/28/2019   Thrombocytopenia ([facility]) 10/17/2017   Hyperlipidemia with target LDL less than 130 09/14/2015   Vaginal atrophy 06/01/2015   Cyst of pancreas 03/06/2015   Routine general medical examination at a health care facility 12/11/2012   Essential hypertension, benign 12/11/2012   Constipation 10/17/2012   Past Medical History:  Diagnosis Date   Anemia    hx    Bronchitis    Bronchitis    hx   Closed patellar sleeve fracture of left knee    Diverticulosis    GERD (gastroesophageal reflux disease)    occ   Headache(784.0)    migraines occ   Hypertension    no meds in over 57yr   Seasonal allergies     Family History  Problem Relation Age of Onset   Esophageal cancer Mother        and ovarian cancer   Alcohol abuse Mother    Arthritis Mother    Heart disease Father    Alcohol abuse Father    Hypertension Father    Diabetes Father    Early death Father    Breast cancer Sister    Colon cancer Other    Pancreatic cancer Other    Stroke Neg Hx    Hyperlipidemia Neg Hx    Kidney disease Neg Hx    COPD Neg Hx     Past Surgical History:  Procedure Laterality Date   ABDOMINAL HYSTERECTOMY     70y/o   APPENDECTOMY  03/17/2013   Procedure: INCIDENTAL APPENDECTOMY;  Surgeon: JGwenyth Ober MD;  [facility]  Service: General;;   capsule endoscopy     COLON SURGERY     COLONOSCOPY     in 2012 -    COLOSTOMY CLOSURE  03/17/2013   COLOSTOMY REVERSAL  03/17/2013   Procedure: COLOSTOMY REVERSAL;  Surgeon: JGwenyth Ober MD;  [facility]  Service: General;;   EUS N/A 04/30/2015   Procedure: UPPER ENDOSCOPIC ULTRASOUND (EUS) LINEAR;  Surgeon: PCarol Ada MD;  [facility];  Service: Endoscopy;  Laterality: N/A;   HERNIA REPAIR     X2 2-4 y/o inguinal   LAPAROTOMY N/A 10/17/2012   Procedure: EXPLORATORY LAPAROTOMY,  COLON RESECTION AND COLOSTOMY;  Surgeon: JGwenyth Ober MD;  [facility]  Service: General;  Laterality: N/A;   LYSIS OF ADHESION  03/17/2013   Procedure: LYSIS OF ADHESION;  Surgeon: JGwenyth Ober MD;  [facility]  Service: General;;   ORIF PATELLA Left 02/20/2020   Procedure: OPEN REDUCTION INTERNAL (ORIF) FIXATION LEFT PATELLA;  Surgeon: XLeandrew Koyanagi MD;  [facility]  Service: Orthopedics;  Laterality: Left;   UPPER GASTROINTESTINAL ENDOSCOPY     2013 -   Social  History   Occupational History   Not on file  Tobacco Use   Smoking status: Never   Smokeless tobacco: Never   Tobacco comments:    EXPOSED TO SECOND HAND SMOKE   Vaping Use   Vaping Use: Never used  Substance and Sexual Activity   Alcohol use: Not Currently    Alcohol/week: 2.0 standard drinks  Types: 2 Glasses of wine per week   Drug use: No   Sexual activity: Not Currently    Birth control/protection: Post-menopausal

## 2020-12-27 DIAGNOSIS — Z20822 Contact with and (suspected) exposure to covid-19: Secondary | ICD-10-CM | POA: Diagnosis not present

## 2021-01-10 ENCOUNTER — Other Ambulatory Visit: Payer: Self-pay

## 2021-01-10 ENCOUNTER — Encounter: Payer: Self-pay | Admitting: Physical Therapy

## 2021-01-10 ENCOUNTER — Ambulatory Visit (INDEPENDENT_AMBULATORY_CARE_PROVIDER_SITE_OTHER): Payer: Medicare Other | Admitting: Physical Therapy

## 2021-01-10 DIAGNOSIS — G8929 Other chronic pain: Secondary | ICD-10-CM | POA: Diagnosis not present

## 2021-01-10 DIAGNOSIS — M6281 Muscle weakness (generalized): Secondary | ICD-10-CM

## 2021-01-10 DIAGNOSIS — M25551 Pain in right hip: Secondary | ICD-10-CM

## 2021-01-10 DIAGNOSIS — M545 Low back pain, unspecified: Secondary | ICD-10-CM

## 2021-01-10 NOTE — Therapy (Signed)
[facility] Physical Therapy [address] [place], [place], [address] Phone: [phone]   Fax:  [phone]  Physical Therapy Evaluation  Patient Details  Name: Alyssa Robinson MRN: [id number] Date of Birth: [birth year] Referring Provider (PT): Leandrew Koyanagi, MD   Encounter Date: 01/10/2021   PT End of Session - 01/10/21 1512     Visit Number 1    Number of Visits 12    Date for PT Re-Evaluation 02/21/21    Authorization Type MCR    Progress Note Due on Visit 10    PT Start Time 1300    PT Stop Time [id number]    PT Time Calculation (min) 49 min    Activity Tolerance Patient tolerated treatment well    Behavior During Therapy [facility] for tasks assessed/performed             Past Medical History:  Diagnosis Date   Anemia    hx   Bronchitis    Bronchitis    hx   Closed patellar sleeve fracture of left knee    Diverticulosis    GERD (gastroesophageal reflux disease)    occ   Headache(784.0)    migraines occ   Hypertension    no meds in over 60yr   Seasonal allergies     Past Surgical History:  Procedure Laterality Date   ABDOMINAL HYSTERECTOMY     70y/o   APPENDECTOMY  03/17/2013   Procedure: INCIDENTAL APPENDECTOMY;  Surgeon: JGwenyth Ober MD;  [facility] [facility]  Service: General;;   capsule endoscopy     COLON SURGERY     COLONOSCOPY     in 2012 -    COLOSTOMY CLOSURE  03/17/2013   COLOSTOMY REVERSAL  03/17/2013   Procedure: COLOSTOMY REVERSAL;  Surgeon: JGwenyth Ober MD;  [facility]: [facility]  Service: General;;   EUS N/A 04/30/2015   Procedure: UPPER ENDOSCOPIC ULTRASOUND (EUS) LINEAR;  Surgeon: PCarol Ada MD;  [facility];  Service: Endoscopy;  Laterality: N/A;   HERNIA REPAIR     X2 2-4 y/o inguinal   LAPAROTOMY N/A 10/17/2012   Procedure: EXPLORATORY LAPAROTOMY,  COLON RESECTION AND COLOSTOMY;  Surgeon: JGwenyth Ober MD;  [facility] [facility]  Service: General;  Laterality: N/A;   LYSIS OF ADHESION  03/17/2013   Procedure: LYSIS  OF ADHESION;  Surgeon: JGwenyth Ober MD;  [facility] [facility]  Service: General;;   ORIF PATELLA Left 02/20/2020   Procedure: OPEN REDUCTION INTERNAL (ORIF) FIXATION LEFT PATELLA;  Surgeon: XLeandrew Koyanagi MD;  [place] [facility]  Service: Orthopedics;  Laterality: Left;   UPPER GASTROINTESTINAL ENDOSCOPY     2013 -    There were no vitals filed for this visit.    Subjective Assessment - 01/10/21 1300     Subjective She had Lt patella ORIF in 2021 and since she feels maybe she favors her Rt leg so she is getting pain in her Rt hip and low back. She feels aching burning pain in the thigh back and the pain is worse with activity.  She does deny numbness and tingling in her leg. She is very active and plays with golf on a regular basis.  She has had some relief from deep tissue massages.  Denies any bowel or bladder dysfunction.    Pertinent History PMH: Lt patella ORIF 2021, osteopenia    Diagnostic tests XR 12/21/20 "Multilevel degenerative disc disease.  Autofusion of L4-5.  Lumbar   spondylosis."  Currently in Pain? Yes    Pain Score 6     Pain Location Back   and Right hip   Pain Orientation Right    Pain Descriptors / Indicators Aching;Burning    Pain Type Chronic pain    Pain Radiating Towards down Rt leg    Pain Onset More than a month ago    Pain Frequency Intermittent    Aggravating Factors  standing, walking uphill    Pain Relieving Factors rest, massage, tart cherry juice                [facility] PT Assessment - 01/10/21 0001       Assessment   Medical Diagnosis LBP with radicuopathy, piriformis syndrome    Referring Provider (PT) Leandrew Koyanagi, MD    Onset Date/Surgical Date --   chronic pain     Precautions   Precautions None      Balance Screen   Has the patient fallen in the past 6 months No    Has the patient had a decrease in activity level because of a fear of falling?  No    Is the patient reluctant to leave their home because of a fear of  falling?  No      Home Ecologist residence      Prior Function   Level of Independence Independent    Vocation Retired    Electrical engineer walking, and with hospice care    Leisure golf, walk      Cognition   Overall Cognitive Status Within Functional Limits for tasks assessed      Observation/Other Assessments   Focus on Therapeutic Outcomes (FOTO)  56% limitation      ROM / Strength   AROM / PROM / Strength AROM;Strength      AROM   Overall AROM Comments lumbar ROM WFL except sidebend limited 50% and painful, hip IR limited to 5 deg on Rt      Strength   Strength Assessment Site Hip;Knee    Right/Left Hip Right;Left    Right Hip Flexion 5/5    Right Hip Extension 3+/5    Right Hip ABduction 4/5    Left Hip Flexion 5/5    Left Hip Extension 4-/5    Left Hip ABduction 4/5    Right/Left Knee Right;Left    Right Knee Flexion 5/5    Right Knee Extension 5/5    Left Knee Flexion 5/5    Left Knee Extension 5/5      Palpation   Palpation comment denies TTP at lumbar spine but very TTP in Rt lumbar P.S, glutes, piriformis, TFL      Special Tests   Other special tests + slump test, + SLR on Rt that was negative on Lt      Transfers   Transfers Independent with all Transfers      Ambulation/Gait   Gait Comments WFL gait pattern, is independent community ambulator                        Objective measurements completed on examination: See above findings.                PT Education - 01/10/21 1511     Education Details HEP,POC,DN handout    Person(s) Educated Patient    Methods Explanation;Demonstration;Verbal cues;Handout    Comprehension Verbalized understanding;Need further instruction  PT Short Term Goals - 01/10/21 1527       PT SHORT TERM GOAL #1   Title Pt will be I and compliant with HEP.    Time 4    Period Weeks    Status New    Target Date 02/07/21                PT Long Term Goals - 01/10/21 1528       PT LONG TERM GOAL #1   Title Pt will improve FOTO goal to 67% functional score.    Time 6    Period Weeks    Status New    Target Date 02/21/21      PT LONG TERM GOAL #2   Title Pt will improve Rt hip IR ROM >10 deg and lumbar sidebending to >60% to improve funcitonal mobility    Time 4    Period Weeks    Status New      PT LONG TERM GOAL #3   Title Pt will improve Rt hip strength to 4+ overall MMT to improve functional strength.      PT LONG TERM GOAL #4   Title She will reduce overall pain to less than 3/10 with usual walking activity, volunteer activity, ADL's and golf    Time 6    Period Weeks    Status New                    Plan - 01/10/21 1516     Clinical Impression Statement Pt presents with Rt sided LBP and Rt hip pain with posible lumbar radiculopathy and or piriformis syndrome. She does lack IR ROM in her Rt hip and has posterior-lateral hip weakness. She will benefit from skilled PT to address her functional impairments listed below.    Personal Factors and Comorbidities Comorbidity 2    Comorbidities PMH: Lt patella ORIF 2021, osteopenia    Examination-Activity Limitations Bend;Squat;Stand;Lift;Locomotion Level;Sleep    Examination-Participation Restrictions Cleaning;Community Activity    Stability/Clinical Decision Making Evolving/Moderate complexity    Clinical Decision Making Moderate    Rehab Potential Good    PT Frequency 2x / week   1-2   PT Duration 6 weeks    PT Treatment/Interventions ADLs/Self Care Home Management;Cryotherapy;Electrical Stimulation;Iontophoresis '4mg'$ /ml Dexamethasone;Moist Heat;Traction;Ultrasound;Therapeutic exercise;Neuromuscular re-education;Gait training;Therapeutic activities;Manual techniques;Dry needling;Passive range of motion;Taping;Joint Manipulations;Spinal Manipulations    PT Next Visit Plan review and update HEP PRN, consider STM,T.P. release, DN, traction.  Needs Rt hip IR ROM and Rt glute strengthening    PT Home Exercise Plan Access Code: [email or web address]             Patient will benefit from skilled therapeutic intervention in order to improve the following deficits and impairments:  Decreased activity tolerance, Decreased range of motion, Decreased strength, Difficulty walking, Impaired flexibility, Pain  Visit Diagnosis: Chronic right-sided low back pain, unspecified whether sciatica present  Pain in right hip  Muscle weakness (generalized)     Problem List Patient Active Problem List   Diagnosis Date Noted   Family history of early CAD 07/02/2020   Displaced comminuted fracture of left patella, initial encounter for closed fracture 02/20/2020   Lipoma of abdominal wall 08/04/2019   Osteopenia 01/28/2019   Allergic rhinitis 01/28/2019   History of shingles 01/28/2019   Thrombocytopenia ([place]) 10/17/2017   Hyperlipidemia with target LDL less than 130 09/14/2015   Vaginal atrophy 06/01/2015   Cyst of pancreas 03/06/2015   Routine general medical examination at a health  care facility 12/11/2012   Essential hypertension, benign 12/11/2012   Constipation 10/17/2012    Debbe Odea, PT,DPT 01/10/2021, 3:50 PM  [facility] Physical Therapy [address] [place], [place], [address] Phone: [phone]   Fax:  [phone]  Name: Alyssa Robinson MRN: [id number] Date of Birth: 1951-02-24

## 2021-01-10 NOTE — Patient Instructions (Signed)
Access Code: [email or web address] URL: [email or web address] Date: 01/10/2021 Prepared by: Elsie Ra  Exercises Seated Piriformis Stretch - 2 x daily - 6 x weekly - 1 sets - 3 reps - 30 hold Seated Sciatic Tensioner - 2 x daily - 6 x weekly - 1 sets - 10 reps - 3 hold Supine Lower Trunk Rotation - 2 x daily - 6 x weekly - 1 sets - 10 reps - 5 sec hold Supine Bridge - 2 x daily - 6 x weekly - 1-2 sets - 10 reps - 5 hold Prone Hip Extension - 2 x daily - 6 x weekly - 2-3 sets - 10 reps Sidelying Hip Abduction - 2 x daily - 6 x weekly - 2-3 sets - 10 reps Standing Lumbar Extension at Wall - Forearms - 2 x daily - 6 x weekly - 1 sets - 10 reps - 5 sec hold Standing Hip Internal Rotation Stretch on Step - 2 x daily - 6 x weekly - 1 sets - 10 reps - 5 sec hold

## 2021-01-11 NOTE — Progress Notes (Signed)
Subjective:    Patient ID: Alyssa Robinson, female    DOB: 07-Apr-1951, 70 y.o.   MRN: [id number]  This visit occurred during the SARS-CoV-2 public health emergency.  Safety protocols were in place, including screening questions prior to the visit, additional usage of staff PPE, and extensive cleaning of exam room while observing appropriate contact time as indicated for disinfecting solutions.    HPI The patient is here for an acute visit.  Left ear feels clogged - she has a history of left ear clogging.  She saw a dermatologist and was prescribed ciclopirox for the outer ear canal for itching.  She uses that intermittently.  This does cause some flaking of the skin.  The left ear has felt clogged recently.  Some pain at times.  No change in hearing.    Medications and allergies reviewed with patient and updated if appropriate.  Patient Active Problem List   Diagnosis Date Noted   Family history of early CAD 07/02/2020   Displaced comminuted fracture of left patella, initial encounter for closed fracture 02/20/2020   Lipoma of abdominal wall 08/04/2019   Osteopenia 01/28/2019   Allergic rhinitis 01/28/2019   History of shingles 01/28/2019   Thrombocytopenia ([place]) 10/17/2017   Hyperlipidemia with target LDL less than 130 09/14/2015   Vaginal atrophy 06/01/2015   Cyst of pancreas 03/06/2015   Routine general medical examination at a health care facility 12/11/2012   Essential hypertension, benign 12/11/2012   Constipation 10/17/2012    Current Outpatient Medications on File Prior to Visit  Medication Sig Dispense Refill   ciclopirox (LOPROX) 0.77 % SUSP Apply topically 2 (two) times daily.     fexofenadine (ALLEGRA) 180 MG tablet Take 180 mg by mouth daily as needed for allergies.      Pediatric Multiple Vit-C-FA (PEDIATRIC MULTIVITAMIN) chewable tablet Chew by mouth.     Probiotic Product ([place]) 170 MG CAPS Take by mouth.     sodium chloride (OCEAN) 0.65 %  SOLN nasal spray Place 1 spray into both nostrils as needed for congestion.     Tart Cherry (TART CHERRY ULTRA) 1200 MG CAPS Take 1 capsule by mouth daily. Take with meal before bedtime     No current facility-administered medications on file prior to visit.    Past Medical History:  Diagnosis Date   Anemia    hx   Bronchitis    Bronchitis    hx   Closed patellar sleeve fracture of left knee    Diverticulosis    GERD (gastroesophageal reflux disease)    occ   Headache(784.0)    migraines occ   Hypertension    no meds in over 57yrs   Seasonal allergies     Past Surgical History:  Procedure Laterality Date   ABDOMINAL HYSTERECTOMY     70 y/o   APPENDECTOMY  03/17/2013   Procedure: INCIDENTAL APPENDECTOMY;  Surgeon: Gwenyth Ober, MD;  Location: [place];  Service: General;;   capsule endoscopy     COLON SURGERY     COLONOSCOPY     in 2012 -    COLOSTOMY CLOSURE  03/17/2013   COLOSTOMY REVERSAL  03/17/2013   Procedure: COLOSTOMY REVERSAL;  Surgeon: Gwenyth Ober, MD;  Location: [place];  Service: General;;   EUS N/A 04/30/2015   Procedure: UPPER ENDOSCOPIC ULTRASOUND (EUS) LINEAR;  Surgeon: Carol Ada, MD;  [facility];  Service: Endoscopy;  Laterality: N/A;   HERNIA REPAIR  X2 2-4 y/o inguinal   LAPAROTOMY N/A 10/17/2012   Procedure: EXPLORATORY LAPAROTOMY,  COLON RESECTION AND COLOSTOMY;  Surgeon: Gwenyth Ober, MD;  Location: [place];  Service: General;  Laterality: N/A;   LYSIS OF ADHESION  03/17/2013   Procedure: LYSIS OF ADHESION;  Surgeon: Gwenyth Ober, MD;  Location: [place];  Service: General;;   ORIF PATELLA Left 02/20/2020   Procedure: OPEN REDUCTION INTERNAL (ORIF) FIXATION LEFT PATELLA;  Surgeon: Leandrew Koyanagi, MD;  Location: [place];  Service: Orthopedics;  Laterality: Left;   UPPER GASTROINTESTINAL ENDOSCOPY     2013 -    Social History   Socioeconomic History   Marital status: Married    Spouse name: Not on file   Number of  children: Not on file   Years of education: Not on file   Highest education level: Not on file  Occupational History   Not on file  Tobacco Use   Smoking status: Never   Smokeless tobacco: Never   Tobacco comments:    EXPOSED TO SECOND HAND SMOKE   Vaping Use   Vaping Use: Never used  Substance and Sexual Activity   Alcohol use: Not Currently    Alcohol/week: 2.0 standard drinks    Types: 2 Glasses of wine per week   Drug use: No   Sexual activity: Not Currently    Birth control/protection: Post-menopausal  Other Topics Concern   Not on file  Social History Narrative   Not on file   Social Determinants of Health   Financial Resource Strain: Low Risk    Difficulty of Paying Living Expenses: Not hard at all  Food Insecurity: No Food Insecurity   Worried About Charity fundraiser in the Last Year: Never true   Ran Out of Food in the Last Year: Never true  Transportation Needs: No Transportation Needs   Lack of Transportation (Medical): No   Lack of Transportation (Non-Medical): No  Physical Activity: Sufficiently Active   Days of Exercise per Week: 7 days   Minutes of Exercise per Session: 30 min  Stress: No Stress Concern Present   Feeling of Stress : Not at all  Social Connections: Socially Integrated   Frequency of Communication with Friends and Family: More than three times a week   Frequency of Social Gatherings with Friends and Family: More than three times a week   Attends Religious Services: More than 4 times per year   Active Member of [employer] or Organizations: Yes   Attends Music therapist: More than 4 times per year   Marital Status: Married    Family History  Problem Relation Age of Onset   Esophageal cancer Mother        and ovarian cancer   Alcohol abuse Mother    Arthritis Mother    Heart disease Father    Alcohol abuse Father    Hypertension Father    Diabetes Father    Early death Father    Breast cancer Sister    Colon cancer  Other    Pancreatic cancer Other    Stroke Neg Hx    Hyperlipidemia Neg Hx    Kidney disease Neg Hx    COPD Neg Hx     Review of Systems  Constitutional:  Negative for chills and fever.  HENT:  Positive for ear pain (occ). Negative for congestion, ear discharge (inner ear moist), hearing loss and sore throat.        Left  ear clogged  Neurological:  Positive for headaches. Negative for dizziness.      Objective:   Vitals:   01/12/21 0915  BP: 118/76  Pulse: 82  Temp: 98.1 F (36.7 C)  SpO2: 99%   BP Readings from Last 3 Encounters:  01/12/21 118/76  07/01/20 120/78  07/01/20 120/78   Wt Readings from Last 3 Encounters:  01/12/21 110 lb (49.9 kg)  07/01/20 110 lb 6.4 oz (50.1 kg)  07/01/20 110 lb 6.4 oz (50.1 kg)   Body mass index is 22.22 kg/m.   Physical Exam Constitutional:      General: She is not in acute distress.    Appearance: Normal appearance. She is not ill-appearing.  HENT:     Head: Normocephalic and atraumatic.     Right Ear: Tympanic membrane, ear canal and external ear normal. There is no impacted cerumen.     Left Ear: Tympanic membrane, ear canal and external ear normal. There is no impacted cerumen.     Ears:     Comments: Mild dryness outer ear canal Skin:    General: Skin is warm and dry.  Neurological:     Mental Status: She is alert.           Assessment & Plan:    eustachian tube dysfunction, left: Acute Left ear exam normal, mild dryness in outer ear canal - can try using topical oil Likely ear clogging from ETD - she can pop her ears and get relief of the clogging sensation Can try flonase and continue saline nasal spray or can continue to intermittent pop ear until it improves.

## 2021-01-12 ENCOUNTER — Other Ambulatory Visit: Payer: Self-pay

## 2021-01-12 ENCOUNTER — Ambulatory Visit (INDEPENDENT_AMBULATORY_CARE_PROVIDER_SITE_OTHER): Payer: Medicare Other | Admitting: Internal Medicine

## 2021-01-12 ENCOUNTER — Encounter: Payer: Self-pay | Admitting: Internal Medicine

## 2021-01-12 VITALS — BP 118/76 | HR 82 | Temp 98.1°F | Ht 59.0 in | Wt 110.0 lb

## 2021-01-12 DIAGNOSIS — H6982 Other specified disorders of Eustachian tube, left ear: Secondary | ICD-10-CM | POA: Diagnosis not present

## 2021-01-12 NOTE — Patient Instructions (Signed)
Try using an oil to moisture your outer left ear.  Your clogged sensation is likely eustachian tube dysfunction.    Eustachian Tube Dysfunction Eustachian tube dysfunction refers to a condition in which a blockage develops in the narrow passage that connects the middle ear to the back of the nose (eustachian tube). The eustachian tube regulates air pressure in the middle ear by letting air move between the ear and nose. It also helps to drain fluid from the middle ear space. Eustachian tube dysfunction can affect one or both ears. When the eustachian tube does not function properly, air pressure, fluid, or both can build up in the middle ear. What are the causes? This condition occurs when the eustachian tube becomes blocked or cannot open normally. Common causes of this condition include: Ear infections. Colds and other infections that affect the nose, mouth, and throat (upper respiratory tract). Allergies. Irritation from cigarette smoke. Irritation from stomach acid coming up into the esophagus (gastroesophageal reflux). The esophagus is the part of the body that moves food from the mouth to the stomach. Sudden changes in air pressure, such as from descending in an airplane or scuba diving. Abnormal growths in the nose or throat, such as: Growths that line the nose (nasal polyps). Abnormal growth of cells (tumors). Enlarged tissue at the back of the throat (adenoids). What increases the risk? You are more likely to develop this condition if: You smoke. You are overweight. You are a child who has: Certain birth defects of the mouth, such as cleft palate. Large tonsils or adenoids. What are the signs or symptoms? Common symptoms of this condition include: A feeling of fullness in the ear. Ear pain. Clicking or popping noises in the ear. Ringing in the ear (tinnitus). Hearing loss. Loss of balance. Dizziness. Symptoms may get worse when the air pressure around you changes, such as  when you travel to an area of high elevation, fly on an airplane, or go scuba diving. How is this diagnosed? This condition may be diagnosed based on: Your symptoms. A physical exam of your ears, nose, and throat. Tests, such as those that measure: The movement of your eardrum. Your hearing (audiometry). How is this treated? Treatment depends on the cause and severity of your condition. In mild cases, you may relieve your symptoms by moving air into your ears. This is called "popping the ears." In more severe cases, or if you have symptoms of fluid in your ears, treatment may include: Medicines to relieve congestion (decongestants). Medicines that treat allergies (antihistamines). Nasal sprays or ear drops that contain medicines that reduce swelling (steroids). A procedure to drain the fluid in your eardrum. In this procedure, a small tube may be placed in the eardrum to: Drain the fluid. Restore the air in the middle ear space. A procedure to insert a balloon device through the nose to inflate the opening of the eustachian tube (balloon dilation). Follow these instructions at home: Lifestyle Do not do any of the following until your health care provider approves: Travel to high altitudes. Fly in airplanes. Work in a Pension scheme manager or room. Scuba dive. Do not use any products that contain nicotine or tobacco. These products include cigarettes, chewing tobacco, and vaping devices, such as e-cigarettes. If you need help quitting, ask your health care provider. Keep your ears dry. Wear fitted earplugs during showering and bathing. Dry your ears completely after. General instructions Take over-the-counter and prescription medicines only as told by your health care provider. Use techniques  to help pop your ears as recommended by your health care provider. These may include: Chewing gum. Yawning. Frequent, forceful swallowing. Closing your mouth, holding your nose closed, and gently  blowing as if you are trying to blow air out of your nose. Keep all follow-up visits. This is important. Contact a health care provider if: Your symptoms do not go away after treatment. Your symptoms come back after treatment. You are unable to pop your ears. You have: A fever. Pain in your ear. Pain in your head or neck. Fluid draining from your ear. Your hearing suddenly changes. You become very dizzy. You lose your balance. Get help right away if: You have a sudden, severe increase in any of your symptoms. Summary Eustachian tube dysfunction refers to a condition in which a blockage develops in the eustachian tube. It can be caused by ear infections, allergies, inhaled irritants, or abnormal growths in the nose or throat. Symptoms may include ear pain or fullness, hearing loss, or ringing in the ears. Mild cases are treated with techniques to unblock the ears, such as yawning or chewing gum. More severe cases are treated with medicines or procedures. This information is not intended to replace advice given to you by your health care provider. Make sure you discuss any questions you have with your health care provider. Document Revised: 06/21/2020 Document Reviewed: 06/21/2020 Elsevier Patient Education  2022 [employer].

## 2021-01-16 DIAGNOSIS — Z23 Encounter for immunization: Secondary | ICD-10-CM | POA: Diagnosis not present

## 2021-01-24 ENCOUNTER — Ambulatory Visit (INDEPENDENT_AMBULATORY_CARE_PROVIDER_SITE_OTHER): Payer: Medicare Other | Admitting: Physical Therapy

## 2021-01-24 ENCOUNTER — Other Ambulatory Visit: Payer: Self-pay

## 2021-01-24 DIAGNOSIS — M6281 Muscle weakness (generalized): Secondary | ICD-10-CM | POA: Diagnosis not present

## 2021-01-24 DIAGNOSIS — M25551 Pain in right hip: Secondary | ICD-10-CM

## 2021-01-24 DIAGNOSIS — G8929 Other chronic pain: Secondary | ICD-10-CM | POA: Diagnosis not present

## 2021-01-24 DIAGNOSIS — M545 Low back pain, unspecified: Secondary | ICD-10-CM

## 2021-01-24 NOTE — Therapy (Signed)
[facility] Physical Therapy [address] [place], [place], [address] Phone: [phone]   Fax:  [phone]  Physical Therapy Treatment  Patient Details  Name: Alyssa Robinson MRN: [id number] Date of Birth: 07/02/1950 Referring Provider (PT): Leandrew Koyanagi, MD   Encounter Date: 01/24/2021   PT End of Session - 01/24/21 1429     Visit Number 2    Number of Visits 12    Date for PT Re-Evaluation 02/21/21    Authorization Type MCR    Progress Note Due on Visit 10    PT Start Time 7902    PT Stop Time 1425    PT Time Calculation (min) 40 min    Activity Tolerance Patient tolerated treatment well    Behavior During Therapy [facility] for tasks assessed/performed             Past Medical History:  Diagnosis Date   Anemia    hx   Bronchitis    Bronchitis    hx   Closed patellar sleeve fracture of left knee    Diverticulosis    GERD (gastroesophageal reflux disease)    occ   Headache(784.0)    migraines occ   Hypertension    no meds in over 40yrs   Seasonal allergies     Past Surgical History:  Procedure Laterality Date   ABDOMINAL HYSTERECTOMY     70 y/o   APPENDECTOMY  03/17/2013   Procedure: INCIDENTAL APPENDECTOMY;  Surgeon: Gwenyth Ober, MD;  Location: [place];  Service: General;;   capsule endoscopy     COLON SURGERY     COLONOSCOPY     in 2012 -    COLOSTOMY CLOSURE  03/17/2013   COLOSTOMY REVERSAL  03/17/2013   Procedure: COLOSTOMY REVERSAL;  Surgeon: Gwenyth Ober, MD;  Location: [place];  Service: General;;   EUS N/A 04/30/2015   Procedure: UPPER ENDOSCOPIC ULTRASOUND (EUS) LINEAR;  Surgeon: Carol Ada, MD;  Location: [facility];  Service: Endoscopy;  Laterality: N/A;   HERNIA REPAIR     X2 2-4 y/o inguinal   LAPAROTOMY N/A 10/17/2012   Procedure: EXPLORATORY LAPAROTOMY,  COLON RESECTION AND COLOSTOMY;  Surgeon: Gwenyth Ober, MD;  Location: [place];  Service: General;  Laterality: N/A;   LYSIS OF ADHESION  03/17/2013   Procedure: LYSIS  OF ADHESION;  Surgeon: Gwenyth Ober, MD;  Location: [place];  Service: General;;   ORIF PATELLA Left 02/20/2020   Procedure: OPEN REDUCTION INTERNAL (ORIF) FIXATION LEFT PATELLA;  Surgeon: Leandrew Koyanagi, MD;  Location: [place];  Service: Orthopedics;  Laterality: Left;   UPPER GASTROINTESTINAL ENDOSCOPY     2013 -    There were no vitals filed for this visit.   Subjective Assessment - 01/24/21 1421     Subjective relays she has been doing HEP and can tell some improvements, has more ROM and less overall pain.    Pertinent History PMH: Lt patella ORIF 2021, osteopenia    Diagnostic tests XR 12/21/20 "Multilevel degenerative disc disease.  Autofusion of L4-5.  Lumbar   spondylosis."    Currently in Pain? Yes    Pain Score 3     Pain Location Hip    Pain Orientation Right    Pain Descriptors / Indicators Aching    Pain Onset More than a month ago              [facility] Adult PT Treatment/Exercise - 01/24/21 0001  Exercises   Exercises Knee/Hip      Knee/Hip Exercises: Machines for Strengthening   Cybex Leg Press DL 75# X20, then 37# SL 2X10 bilat      Knee/Hip Exercises: Standing   Other Standing Knee Exercises hip abd, ext, and marching X20 bilat with red band      Knee/Hip Exercises: Sidelying   Clams red X20 for Rt side    Other Sidelying Knee/Hip Exercises reverse clam for Rt hip X20      Manual Therapy   Manual therapy comments STM and trigger point release to lumbar and Rt glutes,pirioforims, IT band. Pin and stretch to glutes/piriforims with passive IR/ER in prone. Prone lumbar PA mobs grade 2-3. Rt leg long axis distraction mobs and hip PROM                       PT Short Term Goals - 01/10/21 1527       PT SHORT TERM GOAL #1   Title Pt will be I and compliant with HEP.    Time 4    Period Weeks    Status New    Target Date 02/07/21               PT Long Term Goals - 01/10/21 1528       PT LONG TERM GOAL #1   Title  Pt will improve FOTO goal to 67% functional score.    Time 6    Period Weeks    Status New    Target Date 02/21/21      PT LONG TERM GOAL #2   Title Pt will improve Rt hip IR ROM >10 deg and lumbar sidebending to >60% to improve funcitonal mobility    Time 4    Period Weeks    Status New      PT LONG TERM GOAL #3   Title Pt will improve Rt hip strength to 4+ overall MMT to improve functional strength.      PT LONG TERM GOAL #4   Title She will reduce overall pain to less than 3/10 with usual walking activity, volunteer activity, ADL's and golf    Time 6    Period Weeks    Status New                   Plan - 01/24/21 1429     Clinical Impression Statement Worked on manual therapy to reduce pain and improve ROM. This was followed by strength progression with good overall tolerance. We will assess her sorness and response to this next visit.    Personal Factors and Comorbidities Comorbidity 2    Comorbidities PMH: Lt patella ORIF 2021, osteopenia    Examination-Activity Limitations Bend;Squat;Stand;Lift;Locomotion Level;Sleep    Examination-Participation Restrictions Cleaning;Community Activity    Stability/Clinical Decision Making Evolving/Moderate complexity    Rehab Potential Good    PT Frequency 2x / week   1-2   PT Duration 6 weeks    PT Treatment/Interventions ADLs/Self Care Home Management;Cryotherapy;Electrical Stimulation;Iontophoresis 4mg /ml Dexamethasone;Moist Heat;Traction;Ultrasound;Therapeutic exercise;Neuromuscular re-education;Gait training;Therapeutic activities;Manual techniques;Dry needling;Passive range of motion;Taping;Joint Manipulations;Spinal Manipulations    PT Next Visit Plan review and update HEP PRN, consider STM,T.P. release, DN, traction. Needs Rt hip IR ROM and Rt glute strengthening    PT Home Exercise Plan Access Code: [email or web address]             Patient will benefit from skilled therapeutic intervention in order to improve the following  deficits and impairments:  Decreased activity tolerance, Decreased range of motion, Decreased strength, Difficulty walking, Impaired flexibility, Pain  Visit Diagnosis: Chronic right-sided low back pain, unspecified whether sciatica present  Pain in right hip  Muscle weakness (generalized)     Problem List Patient Active Problem List   Diagnosis Date Noted   Family history of early CAD 07/02/2020   Displaced comminuted fracture of left patella, initial encounter for closed fracture 02/20/2020   Lipoma of abdominal wall 08/04/2019   Osteopenia 01/28/2019   Allergic rhinitis 01/28/2019   History of shingles 01/28/2019   Thrombocytopenia ([place]) 10/17/2017   Hyperlipidemia with target LDL less than 130 09/14/2015   Vaginal atrophy 06/01/2015   Cyst of pancreas 03/06/2015   Routine general medical examination at a health care facility 12/11/2012   Essential hypertension, benign 12/11/2012   Constipation 10/17/2012    Debbe Odea, PT,DPT 01/24/2021, 2:36 PM  [facility] Physical Therapy [address] [place], [place], [address] Phone: [phone]   Fax:  [phone]  Name: Alyssa Robinson MRN: [id number] Date of Birth: 10-25-1950

## 2021-01-25 DIAGNOSIS — L570 Actinic keratosis: Secondary | ICD-10-CM | POA: Diagnosis not present

## 2021-01-28 ENCOUNTER — Other Ambulatory Visit: Payer: Self-pay

## 2021-01-28 ENCOUNTER — Ambulatory Visit (INDEPENDENT_AMBULATORY_CARE_PROVIDER_SITE_OTHER): Payer: Medicare Other | Admitting: Physical Therapy

## 2021-01-28 DIAGNOSIS — M25551 Pain in right hip: Secondary | ICD-10-CM

## 2021-01-28 DIAGNOSIS — G8929 Other chronic pain: Secondary | ICD-10-CM

## 2021-01-28 DIAGNOSIS — M6281 Muscle weakness (generalized): Secondary | ICD-10-CM | POA: Diagnosis not present

## 2021-01-28 DIAGNOSIS — M545 Low back pain, unspecified: Secondary | ICD-10-CM | POA: Diagnosis not present

## 2021-01-28 NOTE — Therapy (Signed)
[facility] Physical Therapy [address] [place], [place], [address] Phone: [phone]   Fax:  [phone]  Physical Therapy Treatment  Patient Details  Name: Alyssa Robinson MRN: [id number] Date of Birth: 31-Jul-1950 Referring Provider (PT): Leandrew Koyanagi, MD   Encounter Date: 01/28/2021   PT End of Session - 01/28/21 1109     Visit Number 3    Number of Visits 12    Date for PT Re-Evaluation 02/21/21    Authorization Type MCR    Progress Note Due on Visit 10    PT Start Time 0488    PT Stop Time 1100    PT Time Calculation (min) 44 min    Activity Tolerance Patient tolerated treatment well    Behavior During Therapy [facility] for tasks assessed/performed             Past Medical History:  Diagnosis Date   Anemia    hx   Bronchitis    Bronchitis    hx   Closed patellar sleeve fracture of left knee    Diverticulosis    GERD (gastroesophageal reflux disease)    occ   Headache(784.0)    migraines occ   Hypertension    no meds in over 14yrs   Seasonal allergies     Past Surgical History:  Procedure Laterality Date   ABDOMINAL HYSTERECTOMY     70 y/o   APPENDECTOMY  03/17/2013   Procedure: INCIDENTAL APPENDECTOMY;  Surgeon: Gwenyth Ober, MD;  Location: [place];  Service: General;;   capsule endoscopy     COLON SURGERY     COLONOSCOPY     in 2012 -    COLOSTOMY CLOSURE  03/17/2013   COLOSTOMY REVERSAL  03/17/2013   Procedure: COLOSTOMY REVERSAL;  Surgeon: Gwenyth Ober, MD;  Location: [place];  Service: General;;   EUS N/A 04/30/2015   Procedure: UPPER ENDOSCOPIC ULTRASOUND (EUS) LINEAR;  Surgeon: Carol Ada, MD;  Location: [facility];  Service: Endoscopy;  Laterality: N/A;   HERNIA REPAIR     X2 70 y/o inguinal   LAPAROTOMY N/A 10/17/2012   Procedure: EXPLORATORY LAPAROTOMY,  COLON RESECTION AND COLOSTOMY;  Surgeon: Gwenyth Ober, MD;  Location: [place];  Service: General;  Laterality: N/A;   LYSIS OF ADHESION  03/17/2013   Procedure: LYSIS  OF ADHESION;  Surgeon: Gwenyth Ober, MD;  Location: [place];  Service: General;;   ORIF PATELLA Left 02/20/2020   Procedure: OPEN REDUCTION INTERNAL (ORIF) FIXATION LEFT PATELLA;  Surgeon: Leandrew Koyanagi, MD;  Location: [place];  Service: Orthopedics;  Laterality: Left;   UPPER GASTROINTESTINAL ENDOSCOPY     2013 -    There were no vitals filed for this visit.   Subjective Assessment - 01/28/21 1106     Subjective relays feeling some improvements overall but walked 20,000 steps yesterday and is flared up some from this, 4/10 pain overall in Rt hip and lateral leg. She is intrested in DN today    Pertinent History PMH: Lt patella ORIF 2021, osteopenia    Diagnostic tests XR 12/21/20 "Multilevel degenerative disc disease.  Autofusion of L4-5.  Lumbar   spondylosis."    Pain Onset More than a month ago                               [facility] Adult PT Treatment/Exercise - 01/28/21 0001       Ambulation/Gait  Ambulation/Gait Yes    Ambulation/Gait Assistance 7: Independent    Ambulation Distance (Feet) 200 Feet    Assistive device None    Gait Comments less pain with ambulaiton reported after DN      Knee/Hip Exercises: Aerobic   Recumbent Bike L5 X10 min      Knee/Hip Exercises: Machines for Strengthening   Cybex Leg Press DL 75# X20, then 37# SL 2X10 bilat      Manual Therapy   Manual therapy comments STM and active compression and skilled palpation with DN              Trigger Point Dry Needling - 01/28/21 0001     Consent Given? Yes    Education Handout Provided Yes    Muscles Treated Back/Hip Gluteus medius;Gluteus maximus;Piriformis    Dry Needling Comments two twitch responses, good overall tolerance, reports decreased pain after                     PT Short Term Goals - 01/10/21 1527       PT SHORT TERM GOAL #1   Title Pt will be I and compliant with HEP.    Time 4    Period Weeks    Status New    Target Date  02/07/21               PT Long Term Goals - 01/10/21 1528       PT LONG TERM GOAL #1   Title Pt will improve FOTO goal to 67% functional score.    Time 6    Period Weeks    Status New    Target Date 02/21/21      PT LONG TERM GOAL #2   Title Pt will improve Rt hip IR ROM >10 deg and lumbar sidebending to >60% to improve funcitonal mobility    Time 4    Period Weeks    Status New      PT LONG TERM GOAL #3   Title Pt will improve Rt hip strength to 4+ overall MMT to improve functional strength.      PT LONG TERM GOAL #4   Title She will reduce overall pain to less than 3/10 with usual walking activity, volunteer activity, ADL's and golf    Time 6    Period Weeks    Status New                   Plan - 01/28/21 1110     Clinical Impression Statement She was intrested in DN so we performed this today with good tolerance and she did have less pain with walking afterwords. We will assess her longer term response to this next visit    Personal Factors and Comorbidities Comorbidity 2    Comorbidities PMH: Lt patella ORIF 2021, osteopenia    Examination-Activity Limitations Bend;Squat;Stand;Lift;Locomotion Level;Sleep    Examination-Participation Restrictions Cleaning;Community Activity    Stability/Clinical Decision Making Evolving/Moderate complexity    Rehab Potential Good    PT Frequency 2x / week   1-2   PT Duration 6 weeks    PT Treatment/Interventions ADLs/Self Care Home Management;Cryotherapy;Electrical Stimulation;Iontophoresis 4mg /ml Dexamethasone;Moist Heat;Traction;Ultrasound;Therapeutic exercise;Neuromuscular re-education;Gait training;Therapeutic activities;Manual techniques;Dry needling;Passive range of motion;Taping;Joint Manipulations;Spinal Manipulations    PT Next Visit Plan consider STM,T.P. release, DN, traction. Needs Rt hip IR ROM and Rt glute strengthening    PT Home Exercise Plan Access Code: [email or web address]  Patient will benefit  from skilled therapeutic intervention in order to improve the following deficits and impairments:  Decreased activity tolerance, Decreased range of motion, Decreased strength, Difficulty walking, Impaired flexibility, Pain  Visit Diagnosis: Chronic right-sided low back pain, unspecified whether sciatica present  Pain in right hip  Muscle weakness (generalized)     Problem List Patient Active Problem List   Diagnosis Date Noted   Family history of early CAD 07/02/2020   Displaced comminuted fracture of left patella, initial encounter for closed fracture 02/20/2020   Lipoma of abdominal wall 08/04/2019   Osteopenia 01/28/2019   Allergic rhinitis 01/28/2019   History of shingles 01/28/2019   Thrombocytopenia ([place]) 10/17/2017   Hyperlipidemia with target LDL less than 130 09/14/2015   Vaginal atrophy 06/01/2015   Cyst of pancreas 03/06/2015   Routine general medical examination at a health care facility 12/11/2012   Essential hypertension, benign 12/11/2012   Constipation 10/17/2012    Debbe Odea, PT,DPT 01/28/2021, 11:11 AM  [facility] Physical Therapy [address] [place], [place], [address] Phone: [phone]   Fax:  [phone]  Name: Alyssa Robinson MRN: [id number] Date of Birth: 06-22-1950

## 2021-01-31 ENCOUNTER — Ambulatory Visit (INDEPENDENT_AMBULATORY_CARE_PROVIDER_SITE_OTHER): Payer: Medicare Other | Admitting: Physical Therapy

## 2021-01-31 ENCOUNTER — Other Ambulatory Visit: Payer: Self-pay

## 2021-01-31 DIAGNOSIS — M545 Low back pain, unspecified: Secondary | ICD-10-CM | POA: Diagnosis not present

## 2021-01-31 DIAGNOSIS — M6281 Muscle weakness (generalized): Secondary | ICD-10-CM | POA: Diagnosis not present

## 2021-01-31 DIAGNOSIS — G8929 Other chronic pain: Secondary | ICD-10-CM

## 2021-01-31 DIAGNOSIS — M25551 Pain in right hip: Secondary | ICD-10-CM | POA: Diagnosis not present

## 2021-01-31 NOTE — Therapy (Signed)
[facility] Physical Therapy [address] [place], [place], [address] Phone: [phone]   Fax:  [phone]  Physical Therapy Treatment  Patient Details  Name: Alyssa Robinson MRN: [id number] Date of Birth: 27-Mar-1951 Referring Provider (PT): Leandrew Koyanagi, MD   Encounter Date: 01/31/2021   PT End of Session - 01/31/21 0956     Visit Number 4    Number of Visits 12    Date for PT Re-Evaluation 02/21/21    Authorization Type MCR    Progress Note Due on Visit 10    PT Start Time 0930    PT Stop Time 1194    PT Time Calculation (min) 45 min    Activity Tolerance Patient tolerated treatment well    Behavior During Therapy [facility] for tasks assessed/performed             Past Medical History:  Diagnosis Date   Anemia    hx   Bronchitis    Bronchitis    hx   Closed patellar sleeve fracture of left knee    Diverticulosis    GERD (gastroesophageal reflux disease)    occ   Headache(784.0)    migraines occ   Hypertension    no meds in over 44yrs   Seasonal allergies     Past Surgical History:  Procedure Laterality Date   ABDOMINAL HYSTERECTOMY     70 y/o   APPENDECTOMY  03/17/2013   Procedure: INCIDENTAL APPENDECTOMY;  Surgeon: Gwenyth Ober, MD;  Location: [place];  Service: General;;   capsule endoscopy     COLON SURGERY     COLONOSCOPY     in 2012 -    COLOSTOMY CLOSURE  03/17/2013   COLOSTOMY REVERSAL  03/17/2013   Procedure: COLOSTOMY REVERSAL;  Surgeon: Gwenyth Ober, MD;  Location: [place];  Service: General;;   EUS N/A 04/30/2015   Procedure: UPPER ENDOSCOPIC ULTRASOUND (EUS) LINEAR;  Surgeon: Carol Ada, MD;  [facility]: [facility];  Service: Endoscopy;  Laterality: N/A;   HERNIA REPAIR     X2 2-4 y/o inguinal   LAPAROTOMY N/A 10/17/2012   Procedure: EXPLORATORY LAPAROTOMY,  COLON RESECTION AND COLOSTOMY;  Surgeon: Gwenyth Ober, MD;  Location: [place];  Service: General;  Laterality: N/A;   LYSIS OF ADHESION  03/17/2013   Procedure: LYSIS  OF ADHESION;  Surgeon: Gwenyth Ober, MD;  Location: [place];  Service: General;;   ORIF PATELLA Left 02/20/2020   Procedure: OPEN REDUCTION INTERNAL (ORIF) FIXATION LEFT PATELLA;  Surgeon: Leandrew Koyanagi, MD;  Location: [place];  Service: Orthopedics;  Laterality: Left;   UPPER GASTROINTESTINAL ENDOSCOPY     2013 -    There were no vitals filed for this visit.   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    Pertinent History PMH: Lt patella ORIF 2021, osteopenia    Diagnostic tests XR 12/21/20 "Multilevel degenerative disc disease.  Autofusion of L4-5.  Lumbar   spondylosis."    Pain Onset More than a month ago  [place] Adult PT Treatment/Exercise - 01/31/21 0001       Knee/Hip Exercises: Stretches   Piriformis Stretch Right;3 reps;30 seconds    Other Knee/Hip Stretches sciatic n glide 3 sec X10 on Rt    Other Knee/Hip Stretches lumbar extension at doorway 10 X 3 sec      Knee/Hip Exercises: Aerobic   Recumbent Bike L5 X8 min      Knee/Hip Exercises: Supine   Bridges 15 reps    Bridges Limitations hold 5 sec      Manual Therapy   Manual therapy comments STM and active compression and skilled palpation with DN              Trigger Point Dry Needling - 01/31/21 0001     Consent Given? Yes    Education Handout Provided Previously provided    Muscles Treated Back/Hip Gluteus medius;Gluteus maximus;Piriformis    Dry Needling Comments used E-stim for pain relief micro current 100 frequency, intensity 3.5                     PT Short Term Goals - 01/10/21 1527       PT SHORT TERM GOAL #1   Title Pt will be I and compliant with HEP.     Time 4    Period Weeks    Status New    Target Date 02/07/21               PT Long Term Goals - 01/10/21 1528       PT LONG TERM GOAL #1   Title Pt will improve FOTO goal to 67% functional score.    Time 6    Period Weeks    Status New    Target Date 02/21/21      PT LONG TERM GOAL #2   Title Pt will improve Rt hip IR ROM >10 deg and lumbar sidebending to >60% to improve funcitonal mobility    Time 4    Period Weeks    Status New      PT LONG TERM GOAL #3   Title Pt will improve Rt hip strength to 4+ overall MMT to improve functional strength.      PT LONG TERM GOAL #4   Title She will reduce overall pain to less than 3/10 with usual walking activity, volunteer activity, ADL's and golf    Time 6    Period Weeks    Status New                   Plan - 01/31/21 1002     Clinical Impression Statement It appeared she had sciatica flare up over the weekend. We treated her with DN with addition of Estim today for pain relief and she had good response to this. We also worked on piriformis stretching, sciatic n glides, and lumbar extension today in efforts to reduce her sciatica symptoms.    Personal Factors and Comorbidities Comorbidity 2    Comorbidities PMH: Lt patella ORIF 2021, osteopenia    Examination-Activity Limitations Bend;Squat;Stand;Lift;Locomotion Level;Sleep    Examination-Participation Restrictions Cleaning;Community Activity    Stability/Clinical Decision Making Evolving/Moderate complexity    Rehab Potential Good    PT Frequency 2x / week   1-2   PT Duration 6 weeks    PT Treatment/Interventions ADLs/Self Care Home Management;Cryotherapy;Electrical Stimulation;Iontophoresis 4mg /ml Dexamethasone;Moist Heat;Traction;Ultrasound;Therapeutic exercise;Neuromuscular re-education;Gait training;Therapeutic activities;Manual techniques;Dry needling;Passive range of motion;Taping;Joint Manipulations;Spinal Manipulations    PT Next Visit Plan update  measurments. consider STM,T.P. release, DN, traction. Needs Rt hip IR ROM and Rt glute strengthening    PT Home Exercise Plan Access Code: [email or web address]             Patient will benefit from skilled therapeutic intervention in order to improve the following deficits and impairments:  Decreased activity tolerance, Decreased range of motion, Decreased strength, Difficulty walking, Impaired flexibility, Pain  Visit Diagnosis: Chronic right-sided low back pain, unspecified whether sciatica present  Pain in right hip  Muscle weakness (generalized)     Problem List Patient Active Problem List   Diagnosis Date Noted   Family history of early CAD 07/02/2020   Displaced comminuted fracture of left patella, initial encounter for closed fracture 02/20/2020   Lipoma of abdominal wall 08/04/2019   Osteopenia 01/28/2019   Allergic rhinitis 01/28/2019   History of shingles 01/28/2019   Thrombocytopenia ([place]) 10/17/2017   Hyperlipidemia with target LDL less than 130 09/14/2015   Vaginal atrophy 06/01/2015   Cyst of pancreas 03/06/2015   Routine general medical examination at a health care facility 12/11/2012   Essential hypertension, benign 12/11/2012   Constipation 10/17/2012    Debbe Odea, PT,DPT 01/31/2021, 10:18 AM  [facility] Physical Therapy [address] [place], [place], [address] Phone: [phone]   Fax:  [phone]  Name: Alyssa Robinson MRN: [id number] Date of Birth: [birth year]

## 2021-02-04 ENCOUNTER — Ambulatory Visit (INDEPENDENT_AMBULATORY_CARE_PROVIDER_SITE_OTHER): Payer: Medicare Other | Admitting: Physical Therapy

## 2021-02-04 ENCOUNTER — Other Ambulatory Visit: Payer: Self-pay

## 2021-02-04 DIAGNOSIS — M6281 Muscle weakness (generalized): Secondary | ICD-10-CM

## 2021-02-04 DIAGNOSIS — M545 Low back pain, unspecified: Secondary | ICD-10-CM

## 2021-02-04 DIAGNOSIS — G8929 Other chronic pain: Secondary | ICD-10-CM | POA: Diagnosis not present

## 2021-02-04 DIAGNOSIS — M25551 Pain in right hip: Secondary | ICD-10-CM

## 2021-02-04 NOTE — Therapy (Signed)
[facility] [address] [place], [place], [address] Phone: [phone]   Fax:  [phone]  Physical Therapy Treatment  Patient Details  Name: Alyssa Robinson MRN: [id number] Date of Birth: [birth year] Referring Provider (PT): Leandrew Koyanagi, MD   Encounter Date: 02/04/2021   PT End of Session - 02/04/21 1138     Visit Number 5    Number of Visits 12    Date for PT Re-Evaluation 02/21/21    Authorization Type MCR    Progress Note Due on Visit 10    PT Start Time 1101    PT Stop Time 1145    PT Time Calculation (min) 44 min    Activity Tolerance Patient tolerated treatment well    Behavior During Therapy [facility] for tasks assessed/performed             Past Medical History:  Diagnosis Date   Anemia    hx   Bronchitis    Bronchitis    hx   Closed patellar sleeve fracture of left knee    Diverticulosis    GERD (gastroesophageal reflux disease)    occ   Headache(784.0)    migraines occ   Hypertension    no meds in over 67yrs   Seasonal allergies     Past Surgical History:  Procedure Laterality Date   ABDOMINAL HYSTERECTOMY     70 y/o   APPENDECTOMY  03/17/2013   Procedure: INCIDENTAL APPENDECTOMY;  Surgeon: Gwenyth Ober, MD;  Location: [place];  Service: General;;   capsule endoscopy     COLON SURGERY     COLONOSCOPY     in 2012 -    COLOSTOMY CLOSURE  03/17/2013   COLOSTOMY REVERSAL  03/17/2013   Procedure: COLOSTOMY REVERSAL;  Surgeon: Gwenyth Ober, MD;  Location: [place];  Service: General;;   EUS N/A 04/30/2015   Procedure: UPPER ENDOSCOPIC ULTRASOUND (EUS) LINEAR;  Surgeon: Carol Ada, MD;  [facility];  Service: Endoscopy;  Laterality: N/A;   HERNIA REPAIR     X2 2-4 y/o inguinal   LAPAROTOMY N/A 10/17/2012   Procedure: EXPLORATORY LAPAROTOMY,  COLON RESECTION AND COLOSTOMY;  Surgeon: Gwenyth Ober, MD;  Location: [place];  Service: General;  Laterality: N/A;   LYSIS OF ADHESION  03/17/2013   Procedure: LYSIS  OF ADHESION;  Surgeon: Gwenyth Ober, MD;  Location: [place];  Service: General;;   ORIF PATELLA Left 02/20/2020   Procedure: OPEN REDUCTION INTERNAL (ORIF) FIXATION LEFT PATELLA;  Surgeon: Leandrew Koyanagi, MD;  Location: [place];  Service: Orthopedics;  Laterality: Left;   UPPER GASTROINTESTINAL ENDOSCOPY     2013 -    There were no vitals filed for this visit.   Subjective Assessment - 02/04/21 1126     Subjective She still has radicular symptoms into her Rt leg which are frustrating her.    Pertinent History PMH: Lt patella ORIF 2021, osteopenia    Diagnostic tests XR 12/21/20 "Multilevel degenerative disc disease.  Autofusion of L4-5.  Lumbar   spondylosis."    Pain Onset More than a month ago                               [facility] Adult PT Treatment/Exercise - 02/04/21 0001       Knee/Hip Exercises: Stretches   Other Knee/Hip Stretches long load long duration stretch 5 min in thomas test position with Lt  knee to chest and Rt leg hanging down      Knee/Hip Exercises: Supine   Bridges 20 reps    Bridges Limitations hold 5 sec      Modalities   Modalities Traction      Traction   Type of Traction Lumbar    Min (lbs) 35    Max (lbs) 45    Time 20 min pre set program 6 steps                       PT Short Term Goals - 01/10/21 1527       PT SHORT TERM GOAL #1   Title Pt will be I and compliant with HEP.    Time 4    Period Weeks    Status New    Target Date 02/07/21               PT Long Term Goals - 01/10/21 1528       PT LONG TERM GOAL #1   Title Pt will improve FOTO goal to 67% functional score.    Time 6    Period Weeks    Status New    Target Date 02/21/21      PT LONG TERM GOAL #2   Title Pt will improve Rt hip IR ROM >10 deg and lumbar sidebending to >60% to improve funcitonal mobility    Time 4    Period Weeks    Status New      PT LONG TERM GOAL #3   Title Pt will improve Rt hip strength to 4+  overall MMT to improve functional strength.      PT LONG TERM GOAL #4   Title She will reduce overall pain to less than 3/10 with usual walking activity, volunteer activity, ADL's and golf    Time 6    Period Weeks    Status New                   Plan - 02/04/21 1138     Clinical Impression Statement She is still having radicular symptoms down her Rt leg so we trialed mechanical traction today for spinal decompression and efforts to reduce her symptoms. She felt good after this and did not have any burning down her leg so we will see how she does more long term over the weekend. I did notice that her Rt ishium/illiac crest is higher in standing and walking relative to her left so I provided wher with long duration low load stretch for this to see if this provides any additonal benefit.    Personal Factors and Comorbidities Comorbidity 2    Comorbidities PMH: Lt patella ORIF 2021, osteopenia    Examination-Activity Limitations Bend;Squat;Stand;Lift;Locomotion Level;Sleep    Examination-Participation Restrictions Cleaning;Community Activity    Stability/Clinical Decision Making Evolving/Moderate complexity    Rehab Potential Good    PT Frequency 2x / week   1-2   PT Duration 6 weeks    PT Treatment/Interventions ADLs/Self Care Home Management;Cryotherapy;Electrical Stimulation;Iontophoresis 4mg /ml Dexamethasone;Moist Heat;Traction;Ultrasound;Therapeutic exercise;Neuromuscular re-education;Gait training;Therapeutic activities;Manual techniques;Dry needling;Passive range of motion;Taping;Joint Manipulations;Spinal Manipulations    PT Next Visit Plan update measurments. consider STM,T.P. release, DN, traction. Needs Rt hip IR ROM and Rt glute strengthening    PT Home Exercise Plan Access Code: [email or web address]             Patient will benefit from skilled therapeutic intervention in order to improve the following deficits and impairments:  Decreased activity tolerance, Decreased range of  motion, Decreased strength, Difficulty walking, Impaired flexibility, Pain  Visit Diagnosis: Chronic right-sided low back pain, unspecified whether sciatica present  Pain in right hip  Muscle weakness (generalized)     Problem List Patient Active Problem List   Diagnosis Date Noted   Family history of early CAD 07/02/2020   Displaced comminuted fracture of left patella, initial encounter for closed fracture 02/20/2020   Lipoma of abdominal wall 08/04/2019   Osteopenia 01/28/2019   Allergic rhinitis 01/28/2019   History of shingles 01/28/2019   Thrombocytopenia ([place]) 10/17/2017   Hyperlipidemia with target LDL less than 130 09/14/2015   Vaginal atrophy 06/01/2015   Cyst of pancreas 03/06/2015   Routine general medical examination at a health care facility 12/11/2012   Essential hypertension, benign 12/11/2012   Constipation 10/17/2012    Debbe Odea, PT,DPT 02/04/2021, 11:51 AM  [facility] [address] [place], [place], [address] Phone: [phone]   Fax:  [phone]  Name: VONDELL SOWELL MRN: [id number] Date of Birth: 1950-10-20

## 2021-02-07 ENCOUNTER — Ambulatory Visit (INDEPENDENT_AMBULATORY_CARE_PROVIDER_SITE_OTHER): Payer: Medicare Other | Admitting: Physical Therapy

## 2021-02-07 ENCOUNTER — Other Ambulatory Visit: Payer: Self-pay

## 2021-02-07 DIAGNOSIS — M6281 Muscle weakness (generalized): Secondary | ICD-10-CM | POA: Diagnosis not present

## 2021-02-07 DIAGNOSIS — M25551 Pain in right hip: Secondary | ICD-10-CM

## 2021-02-07 DIAGNOSIS — M545 Low back pain, unspecified: Secondary | ICD-10-CM

## 2021-02-07 DIAGNOSIS — G8929 Other chronic pain: Secondary | ICD-10-CM | POA: Diagnosis not present

## 2021-02-07 NOTE — Therapy (Signed)
[facility] [address] [place], [place], [address] Phone: [phone]   Fax:  [phone]  Physical Therapy Treatment  Patient Details  Name: Alyssa Robinson MRN: [id number] Date of Birth: [birth year] Referring Provider (PT): Leandrew Koyanagi, MD   Encounter Date: 02/07/2021   PT End of Session - 02/07/21 0956     Visit Number 6    Number of Visits 12    Date for PT Re-Evaluation 02/21/21    Authorization Type MCR    Progress Note Due on Visit 10    PT Start Time 0930    PT Stop Time 0272    PT Time Calculation (min) 45 min    Activity Tolerance Patient tolerated treatment well    Behavior During Therapy [facility] for tasks assessed/performed             Past Medical History:  Diagnosis Date   Anemia    hx   Bronchitis    Bronchitis    hx   Closed patellar sleeve fracture of left knee    Diverticulosis    GERD (gastroesophageal reflux disease)    occ   Headache(784.0)    migraines occ   Hypertension    no meds in over 57yrs   Seasonal allergies     Past Surgical History:  Procedure Laterality Date   ABDOMINAL HYSTERECTOMY     70 y/o   APPENDECTOMY  03/17/2013   Procedure: INCIDENTAL APPENDECTOMY;  Surgeon: Gwenyth Ober, MD;  Location: [place];  Service: General;;   capsule endoscopy     COLON SURGERY     COLONOSCOPY     in 2012 -    COLOSTOMY CLOSURE  03/17/2013   COLOSTOMY REVERSAL  03/17/2013   Procedure: COLOSTOMY REVERSAL;  Surgeon: Gwenyth Ober, MD;  Location: [place];  Service: General;;   EUS N/A 04/30/2015   Procedure: UPPER ENDOSCOPIC ULTRASOUND (EUS) LINEAR;  Surgeon: Carol Ada, MD;  [facility];  Service: Endoscopy;  Laterality: N/A;   HERNIA REPAIR     X2 2-4 y/o inguinal   LAPAROTOMY N/A 10/17/2012   Procedure: EXPLORATORY LAPAROTOMY,  COLON RESECTION AND COLOSTOMY;  Surgeon: Gwenyth Ober, MD;  Location: [place];  Service: General;  Laterality: N/A;   LYSIS OF ADHESION  03/17/2013   Procedure: LYSIS  OF ADHESION;  Surgeon: Gwenyth Ober, MD;  Location: [place];  Service: General;;   ORIF PATELLA Left 02/20/2020   Procedure: OPEN REDUCTION INTERNAL (ORIF) FIXATION LEFT PATELLA;  Surgeon: Leandrew Koyanagi, MD;  Location: [place];  Service: Orthopedics;  Laterality: Left;   UPPER GASTROINTESTINAL ENDOSCOPY     2013 -    There were no vitals filed for this visit.   Subjective Assessment - 02/07/21 0948     Subjective She relays the traction really helped, not having any low back pain today but does still get some pains and pulling down her Rt leg still but overall better and the new stretch at home helps with this.    Pertinent History PMH: Lt patella ORIF 2021, osteopenia    Diagnostic tests XR 12/21/20 "Multilevel degenerative disc disease.  Autofusion of L4-5.  Lumbar   spondylosis."    Pain Onset More than a month ago                [facility] PT Assessment - 02/07/21 0001       AROM   Overall AROM Comments lumbar ROM WFL except sidebend limited  60% and no longer painful but still with pulll, hip IR limited to 20 deg but this improved from 5 deg on Rt at eval                           [facility] Adult PT Treatment/Exercise - 02/07/21 0001       Knee/Hip Exercises: Stretches   Piriformis Stretch Right;3 reps;30 seconds    Piriformis Stretch Limitations seated in chair    Other Knee/Hip Stretches standing lumbar extension 10 reps holding 3 sec    Other Knee/Hip Stretches long load long duration stretch 3 min in thomas test position with Lt knee to chest and Rt leg hanging down with 2# ankle weight      Knee/Hip Exercises: Standing   Other Standing Knee Exercises hip extensions, hip abducitons, H.S curls X20 of each bilat with 5#      Knee/Hip Exercises: Supine   Bridges --    Bridges Limitations --      Traction   Type of Traction Lumbar    Min (lbs) 40    Max (lbs) 50    Time 15 min pre set program 4 steps                        PT Short Term Goals - 01/10/21 1527       PT SHORT TERM GOAL #1   Title Pt will be I and compliant with HEP.    Time 4    Period Weeks    Status New    Target Date 02/07/21               PT Long Term Goals - 01/10/21 1528       PT LONG TERM GOAL #1   Title Pt will improve FOTO goal to 67% functional score.    Time 6    Period Weeks    Status New    Target Date 02/21/21      PT LONG TERM GOAL #2   Title Pt will improve Rt hip IR ROM >10 deg and lumbar sidebending to >60% to improve funcitonal mobility    Time 4    Period Weeks    Status New      PT LONG TERM GOAL #3   Title Pt will improve Rt hip strength to 4+ overall MMT to improve functional strength.      PT LONG TERM GOAL #4   Title She will reduce overall pain to less than 3/10 with usual walking activity, volunteer activity, ADL's and golf    Time 6    Period Weeks    Status New                   Plan - 02/07/21 0958     Clinical Impression Statement She appears to have had some early benefit from mechanical traction and new stretch for pelvic alignment at home. We added a little more stretch to traction today and we discussed some aquatic exercises that may also provide some benefit to her. Continue POC    Personal Factors and Comorbidities Comorbidity 2    Comorbidities PMH: Lt patella ORIF 2021, osteopenia    Examination-Activity Limitations Bend;Squat;Stand;Lift;Locomotion Level;Sleep    Examination-Participation Restrictions Cleaning;Community Activity    Stability/Clinical Decision Making Evolving/Moderate complexity    Rehab Potential Good    PT Frequency 2x / week   1-2   PT Duration 6  weeks    PT Treatment/Interventions ADLs/Self Care Home Management;Cryotherapy;Electrical Stimulation;Iontophoresis 4mg /ml Dexamethasone;Moist Heat;Traction;Ultrasound;Therapeutic exercise;Neuromuscular re-education;Gait training;Therapeutic activities;Manual techniques;Dry needling;Passive range of  motion;Taping;Joint Manipulations;Spinal Manipulations    PT Next Visit Plan how was traction?    PT Home Exercise Plan Access Code: [email or web address]             Patient will benefit from skilled therapeutic intervention in order to improve the following deficits and impairments:  Decreased activity tolerance, Decreased range of motion, Decreased strength, Difficulty walking, Impaired flexibility, Pain  Visit Diagnosis: Chronic right-sided low back pain, unspecified whether sciatica present  Pain in right hip  Muscle weakness (generalized)     Problem List Patient Active Problem List   Diagnosis Date Noted   Family history of early CAD 07/02/2020   Displaced comminuted fracture of left patella, initial encounter for closed fracture 02/20/2020   Lipoma of abdominal wall 08/04/2019   Osteopenia 01/28/2019   Allergic rhinitis 01/28/2019   History of shingles 01/28/2019   Thrombocytopenia ([place]) 10/17/2017   Hyperlipidemia with target LDL less than 130 09/14/2015   Vaginal atrophy 06/01/2015   Cyst of pancreas 03/06/2015   Routine general medical examination at a health care facility 12/11/2012   Essential hypertension, benign 12/11/2012   Constipation 10/17/2012    Debbe Odea, PT,DPT 02/07/2021, 10:20 AM  [facility] [address] [place], [place], [address] Phone: [phone]   Fax:  [phone]  Name: NEVA RAMASWAMY MRN: [id number] Date of Birth: 1950-12-26

## 2021-02-11 ENCOUNTER — Other Ambulatory Visit: Payer: Self-pay

## 2021-02-11 ENCOUNTER — Ambulatory Visit (INDEPENDENT_AMBULATORY_CARE_PROVIDER_SITE_OTHER): Payer: Medicare Other | Admitting: Physical Therapy

## 2021-02-11 DIAGNOSIS — G8929 Other chronic pain: Secondary | ICD-10-CM | POA: Diagnosis not present

## 2021-02-11 DIAGNOSIS — M6281 Muscle weakness (generalized): Secondary | ICD-10-CM

## 2021-02-11 DIAGNOSIS — M545 Low back pain, unspecified: Secondary | ICD-10-CM

## 2021-02-11 DIAGNOSIS — M25551 Pain in right hip: Secondary | ICD-10-CM | POA: Diagnosis not present

## 2021-02-11 NOTE — Therapy (Addendum)
[facility] [address] [place], [place], [address] Phone: [phone]   Fax:  [phone]  Physical Therapy Treatment/Discharge addendum PHYSICAL THERAPY DISCHARGE SUMMARY  Visits from Start of Care: 7  Current functional level related to goals / functional outcomes: See below   Remaining deficits: See below   Education / Equipment: HEP Plan: Patient agrees to discharge.  Patient goals were some met. Patient is being discharged due to not returning to PT.      Patient Details  Name: Alyssa Robinson MRN: [id number] Date of Birth: Dec 09, 1950 Referring Provider (PT): Leandrew Koyanagi, MD   Encounter Date: 02/11/2021   PT End of Session - 02/11/21 1100     Visit Number 7    Number of Visits 12    Date for PT Re-Evaluation 02/21/21    Authorization Type MCR    Progress Note Due on Visit 10    PT Start Time 3875    PT Stop Time 1059    PT Time Calculation (min) 44 min    Activity Tolerance Patient tolerated treatment well    Behavior During Therapy WFL for tasks assessed/performed             Past Medical History:  Diagnosis Date   Anemia    hx   Bronchitis    Bronchitis    hx   Closed patellar sleeve fracture of left knee    Diverticulosis    GERD (gastroesophageal reflux disease)    occ   Headache(784.0)    migraines occ   Hypertension    no meds in over 80yr   Seasonal allergies     Past Surgical History:  Procedure Laterality Date   ABDOMINAL HYSTERECTOMY     70y/o   APPENDECTOMY  03/17/2013   Procedure: INCIDENTAL APPENDECTOMY;  Surgeon: JGwenyth Ober MD;  [place]  Service: General;;   capsule endoscopy     COLON SURGERY     COLONOSCOPY     in 2012 -    COLOSTOMY CLOSURE  03/17/2013   COLOSTOMY REVERSAL  03/17/2013   Procedure: COLOSTOMY REVERSAL;  Surgeon: JGwenyth Ober MD;  [place]  Service: General;;   EUS N/A 04/30/2015   Procedure: UPPER ENDOSCOPIC ULTRASOUND (EUS) LINEAR;   Surgeon: PCarol Ada MD;  [facility];  Service: Endoscopy;  Laterality: N/A;   HERNIA REPAIR     X2 2-4 y/o inguinal   LAPAROTOMY N/A 10/17/2012   Procedure: EXPLORATORY LAPAROTOMY,  COLON RESECTION AND COLOSTOMY;  Surgeon: JGwenyth Ober MD;  [place]  Service: General;  Laterality: N/A;   LYSIS OF ADHESION  03/17/2013   Procedure: LYSIS OF ADHESION;  Surgeon: JGwenyth Ober MD;  [place]  Service: General;;   ORIF PATELLA Left 02/20/2020   Procedure: OPEN REDUCTION INTERNAL (ORIF) FIXATION LEFT PATELLA;  Surgeon: XLeandrew Koyanagi MD;  [place]  Service: Orthopedics;  Laterality: Left;   UPPER GASTROINTESTINAL ENDOSCOPY     2013 -    There were no vitals filed for this visit.   Subjective Assessment - 02/11/21 1045     Subjective She relays overall her low back feels good but still with pain in her Rt hip that "twinges" down her leg at times and is worse toward the end of the day after lots of activity. She says overall pain has improved with PT from overall 6 to 4 out of 10    Pertinent History  PMH: Lt patella ORIF 2021, osteopenia    Diagnostic tests XR 12/21/20 "Multilevel degenerative disc disease.  Autofusion of L4-5.  Lumbar   spondylosis."    Pain Onset More than a month ago            FOTO functional score 69% which met goal for this and improved form 55% at eval.   [facility] Adult PT Treatment/Exercise - 02/11/21 0001       Exercises   Exercises Other Exercises    Other Exercises  extensive time spent today on education for possbile bursitis symptoms and activity reduction program for 2 weeks and if symptoms dont improve to reach back out to MD for follow up      Traction   Min (lbs) 40    Max (lbs) 55    Time 15 min pre set program 4 steps                       PT Short Term Goals - 02/11/21 1103       PT SHORT TERM GOAL #1   Title Pt will be I and compliant with HEP.    Time 4    Period Weeks     Status Achieved    Target Date 02/07/21               PT Long Term Goals - 02/11/21 1103       PT LONG TERM GOAL #1   Title Pt will improve FOTO goal to 67% functional score.    Baseline now 69%    Time 6    Period Weeks    Status Achieved      PT LONG TERM GOAL #2   Title Pt will improve Rt hip IR ROM >10 deg and lumbar sidebending to >60% to improve funcitonal mobility    Baseline now met    Time 4    Period Weeks    Status Achieved      PT LONG TERM GOAL #3   Title Pt will improve Rt hip strength to 4+ overall MMT to improve functional strength.    Baseline did not test today    Status On-going      PT LONG TERM GOAL #4   Title She will reduce overall pain to less than 3/10 with usual walking activity, volunteer activity, ADL's and golf    Baseline about 4/10 now    Time 6    Period Weeks    Status On-going                   Plan - 02/11/21 1101     Clinical Impression Statement She has recieved great benefit from mechanical traction and HEP in terms of her low back pain. She does still have pain down her Rt hip that comes and goes and is worse with activity. I feel this is now presenting more as bursitis now that her LBP has greatly improved. I spent extensice time today on activity reduction reccomendations for 2 weeks and then plan will be to follow up with MD if she feels she does not improve with rest for further evaluation, imaging, or possible injection. She will follow up with PT again if needed. She has however met her functional level goal on FOTO.    Personal Factors and Comorbidities Comorbidity 2    Comorbidities PMH: Lt patella ORIF 2021, osteopenia    Examination-Activity Limitations Bend;Squat;Stand;Lift;Locomotion Level;Sleep    Examination-Participation Restrictions Cleaning;Community  Activity    Stability/Clinical Decision Making Evolving/Moderate complexity    Rehab Potential Good    PT Frequency 2x / week   1-2   PT Duration 6  weeks    PT Treatment/Interventions ADLs/Self Care Home Management;Cryotherapy;Electrical Stimulation;Iontophoresis 74m/ml Dexamethasone;Moist Heat;Traction;Ultrasound;Therapeutic exercise;Neuromuscular re-education;Gait training;Therapeutic activities;Manual techniques;Dry needling;Passive range of motion;Taping;Joint Manipulations;Spinal Manipulations    PT Next Visit Plan hold PT for 2 weeks    PT Home Exercise Plan Access Code: [email or web address]            Patient will benefit from skilled therapeutic intervention in order to improve the following deficits and impairments:  Decreased activity tolerance, Decreased range of motion, Decreased strength, Difficulty walking, Impaired flexibility, Pain  Visit Diagnosis: Chronic right-sided low back pain, unspecified whether sciatica present  Pain in right hip  Muscle weakness (generalized)     Problem List Patient Active Problem List   Diagnosis Date Noted   Family history of early CAD 07/02/2020   Displaced comminuted fracture of left patella, initial encounter for closed fracture 02/20/2020   Lipoma of abdominal wall 08/04/2019   Osteopenia 01/28/2019   Allergic rhinitis 01/28/2019   History of shingles 01/28/2019   Thrombocytopenia ([place] 10/17/2017   Hyperlipidemia with target LDL less than 130 09/14/2015   Vaginal atrophy 06/01/2015   Cyst of pancreas 03/06/2015   Routine general medical examination at a health care facility 12/11/2012   Essential hypertension, benign 12/11/2012   Constipation 10/17/2012    BDebbe Odea PT,DPT 02/11/2021, 11:04 AM  [facility] [address] [place] [address]Phone: [phone]  Fax:  [phone] Name: BSUDIE BANDELMRN: [id number] of Birth: 111-14-1952

## 2021-03-08 ENCOUNTER — Telehealth (INDEPENDENT_AMBULATORY_CARE_PROVIDER_SITE_OTHER): Payer: Medicare Other | Admitting: Family Medicine

## 2021-03-08 DIAGNOSIS — R0981 Nasal congestion: Secondary | ICD-10-CM | POA: Diagnosis not present

## 2021-03-08 MED ORDER — AMOXICILLIN 500 MG PO CAPS: 500.0000 mg | ORAL_CAPSULE | Freq: Two times a day (BID) | ORAL | 0 refills | Status: AC

## 2021-03-08 NOTE — Patient Instructions (Signed)
-  nasal saline twice daily  -drink plenty of fluids  -Musinex if needed per instructions   -I sent the medication(s) we discussed to your pharmacy: Meds ordered this encounter  Medications   amoxicillin (AMOXIL) 500 MG capsule    Sig: Take 1 capsule (500 mg total) by mouth 2 (two) times daily for 10 days.    Dispense:  20 capsule    Refill:  0     I hope you are feeling better soon!  Seek in person care promptly if your symptoms worsen, new concerns arise or you are not improving with treatment.  It was nice to meet you today. I help [facility] out with telemedicine visits on Tuesdays and Thursdays and am available for visits on those days. If you have any concerns or questions following this visit please schedule a follow up visit with your Primary Care doctor or seek care at a local urgent care clinic to avoid delays in care.

## 2021-03-08 NOTE — Progress Notes (Signed)
Virtual Visit via Video Note  I connected with Alyssa Robinson  on 03/08/21 at 12:20 PM EST by a video enabled telemedicine application and verified that I am speaking with the correct person using two identifiers.  Location patient: home, [place] Location provider:work or home office Persons participating in the virtual visit: patient, provider  I discussed the limitations of evaluation and management by telemedicine and the availability of in person appointments. The patient expressed understanding and agreed to proceed.   HPI:  Acute telemedicine visit for sinus issues: -Onset: has seasonal allergies that always get worse this time of the year but worsening the last 5-6 days -Symptoms include: nasal congestion that now has worsened and mucus is green and thick, drainage -she reports amoxicillin usually helps this and is requesting -Denies:head pain, SOB, CP, fevers, body aches, NVD -Has tried: musinex, gargling with salt water -Pertinent past medical history: hx of bronchitis -Pertinent medication allergies:  Allergies  Allergen Reactions   Caffeine Palpitations and Nausea And Vomiting    Heart racing with high doses Makes heart race Heart racing Makes heart race Heart racing with high doses Makes heart race Makes heart race    Sodium Lauryl Sulfate Rash    Swollen gums   -COVID-19 vaccine status:  Immunization History  Administered Date(s) Administered   Influenza, High Dose Seasonal PF 12/16/2018   Influenza, Quadrivalent, Recombinant, Inj, Pf 01/30/2018   Influenza,inj,Quad PF,6+ Mos 03/25/2020   PFIZER(Purple Top)SARS-COV-2 Vaccination 05/31/2019, 06/25/2019, 04/29/2020   Pneumococcal Conjugate-13 05/13/2014   Pneumococcal Polysaccharide-23 09/14/2015   Rabies, IM 12/07/2011, 12/10/2011, 12/14/2011, 12/21/2011   Rabies, intradermal 12/07/2011   Tdap 11/23/2005, 09/14/2015   Zoster Recombinat (Shingrix) 02/18/2019, 04/30/2019   Zoster, Live 11/11/2009    ROS: See pertinent  positives and negatives per HPI.  Past Medical History:  Diagnosis Date   Anemia    hx   Bronchitis    Bronchitis    hx   Closed patellar sleeve fracture of left knee    Diverticulosis    GERD (gastroesophageal reflux disease)    occ   Headache(784.0)    migraines occ   Hypertension    no meds in over 15yrs   Seasonal allergies     Past Surgical History:  Procedure Laterality Date   ABDOMINAL HYSTERECTOMY     70 y/o   APPENDECTOMY  03/17/2013   Procedure: INCIDENTAL APPENDECTOMY;  Surgeon: Gwenyth Ober, MD;  Location: [facility];  Service: General;;   capsule endoscopy     COLON SURGERY     COLONOSCOPY     in 2012 -    COLOSTOMY CLOSURE  03/17/2013   COLOSTOMY REVERSAL  03/17/2013   Procedure: COLOSTOMY REVERSAL;  Surgeon: Gwenyth Ober, MD;  Location: [facility];  Service: General;;   EUS N/A 04/30/2015   Procedure: UPPER ENDOSCOPIC ULTRASOUND (EUS) LINEAR;  Surgeon: Carol Ada, MD;  Location: [facility];  Service: Endoscopy;  Laterality: N/A;   HERNIA REPAIR     X2 2-4 y/o inguinal   LAPAROTOMY N/A 10/17/2012   Procedure: EXPLORATORY LAPAROTOMY,  COLON RESECTION AND COLOSTOMY;  Surgeon: Gwenyth Ober, MD;  Location: [facility];  Service: General;  Laterality: N/A;   LYSIS OF ADHESION  03/17/2013   Procedure: LYSIS OF ADHESION;  Surgeon: Gwenyth Ober, MD;  Location: [facility];  Service: General;;   ORIF PATELLA Left 02/20/2020   Procedure: OPEN REDUCTION INTERNAL (ORIF) FIXATION LEFT PATELLA;  Surgeon: Leandrew Koyanagi, MD;  Location: [facility];  Service:  Orthopedics;  Laterality: Left;   UPPER GASTROINTESTINAL ENDOSCOPY     2013 -     Current Outpatient Medications:    amoxicillin (AMOXIL) 500 MG capsule, Take 1 capsule (500 mg total) by mouth 2 (two) times daily for 10 days., Disp: 20 capsule, Rfl: 0   ciclopirox (LOPROX) 0.77 % SUSP, Apply topically 2 (two) times daily., Disp: , Rfl:    fexofenadine (ALLEGRA) 180 MG tablet, Take 180 mg by mouth daily as needed for  allergies. , Disp: , Rfl:    Pediatric Multiple Vit-C-FA (PEDIATRIC MULTIVITAMIN) chewable tablet, Chew by mouth., Disp: , Rfl:    Probiotic Product ([place]) 170 MG CAPS, Take by mouth., Disp: , Rfl:    sodium chloride (OCEAN) 0.65 % SOLN nasal spray, Place 1 spray into both nostrils as needed for congestion., Disp: , Rfl:    Tart Cherry (TART CHERRY ULTRA) 1200 MG CAPS, Take 1 capsule by mouth daily. Take with meal before bedtime, Disp: , Rfl:   EXAM:  VITALS per patient if applicable:  GENERAL: alert, oriented, appears well and in no acute distress  HEENT: atraumatic, conjunttiva clear, no obvious abnormalities on inspection of external nose and ears  NECK: normal movements of the head and neck  LUNGS: on inspection no signs of respiratory distress, breathing rate appears normal, no obvious gross SOB, gasping or wheezing  CV: no obvious cyanosis  MS: moves all visible extremities without noticeable abnormality  PSYCH/NEURO: pleasant and cooperative, no obvious depression or anxiety, speech and thought processing grossly intact  ASSESSMENT AND PLAN:  Discussed the following assessment and plan:  Nasal congestion  -we discussed possible serious and likely etiologies, options for evaluation and workup, limitations of telemedicine visit vs in person visit, treatment, treatment risks and precautions. Pt is agreeable to treatment via telemedicine at this moment. Query VURI, sinusitis, covid19 vs other. Discussed risks with abx and appropriate use. She has opted to try nasal saline and musinex but feels strongly need amox script as well - reasonable if worsening or not improving. She prefers this to other options such as augmentin as reports they are too strong for her.  Advised to seek prompt in person care if worsening, new symptoms arise, or if is not improving with treatment. I discussed the assessment and treatment plan with the patient. The patient was provided an  opportunity to ask questions and all were answered. The patient agreed with the plan and demonstrated an understanding of the instructions.     Alyssa Kern, DO

## 2021-03-25 DIAGNOSIS — H40013 Open angle with borderline findings, low risk, bilateral: Secondary | ICD-10-CM | POA: Diagnosis not present

## 2021-04-21 DIAGNOSIS — R051 Acute cough: Secondary | ICD-10-CM | POA: Diagnosis not present

## 2021-04-22 ENCOUNTER — Telehealth: Payer: Medicare Other | Admitting: Family Medicine

## 2021-06-13 DIAGNOSIS — Z1231 Encounter for screening mammogram for malignant neoplasm of breast: Secondary | ICD-10-CM | POA: Diagnosis not present

## 2021-06-22 DIAGNOSIS — H2513 Age-related nuclear cataract, bilateral: Secondary | ICD-10-CM | POA: Diagnosis not present

## 2021-06-22 DIAGNOSIS — H40013 Open angle with borderline findings, low risk, bilateral: Secondary | ICD-10-CM | POA: Diagnosis not present

## 2021-06-22 DIAGNOSIS — H5203 Hypermetropia, bilateral: Secondary | ICD-10-CM | POA: Diagnosis not present

## 2021-06-22 DIAGNOSIS — H52203 Unspecified astigmatism, bilateral: Secondary | ICD-10-CM | POA: Diagnosis not present

## 2021-06-22 DIAGNOSIS — H524 Presbyopia: Secondary | ICD-10-CM | POA: Diagnosis not present

## 2021-06-28 ENCOUNTER — Ambulatory Visit (INDEPENDENT_AMBULATORY_CARE_PROVIDER_SITE_OTHER): Payer: Medicare Other

## 2021-06-28 ENCOUNTER — Ambulatory Visit (INDEPENDENT_AMBULATORY_CARE_PROVIDER_SITE_OTHER): Payer: Medicare Other | Admitting: Orthopaedic Surgery

## 2021-06-28 ENCOUNTER — Other Ambulatory Visit: Payer: Self-pay

## 2021-06-28 ENCOUNTER — Encounter: Payer: Self-pay | Admitting: Orthopaedic Surgery

## 2021-06-28 DIAGNOSIS — M1611 Unilateral primary osteoarthritis, right hip: Secondary | ICD-10-CM

## 2021-06-28 NOTE — Progress Notes (Signed)
? ?Office Visit Note ?  ?Patient: Alyssa Robinson           ?Date of Birth: 02/20/1951           ?MRN: [id number] ?Visit Date: 06/28/2021 ?             ?Requested by: Hoyt Koch, MD ?[place],  [place] [id number] ?PCP: Hoyt Koch, MD ? ? ?Assessment & Plan: ?Visit Diagnoses:  ?1. Primary osteoarthritis of right hip   ? ? ?Plan: Based on findings Sosha has bone-on-bone advanced right hip DJD.  She is having significant difficulty with ADLs such as putting on shoes and socks and walking.  Treatment options were reviewed to include injections, continue physical therapy, medications, total hip replacement and their associated pros and cons.  Based on her options she would like to try an injection first.  She understands that this will likely come down to a total hip replacement because she is bone-on-bone already. ? ?Follow-Up Instructions: No follow-ups on file.  ? ?Orders:  ?Orders Placed This Encounter  ?Procedures  ? XR HIP UNILAT W OR W/O PELVIS 2-3 VIEWS RIGHT  ? Ambulatory referral to Physical Medicine Rehab  ? ?No orders of the defined types were placed in this encounter. ? ? ? ? Procedures: ?No procedures performed ? ? ?Clinical Data: ?No additional findings. ? ? ?Subjective: ?Chief Complaint  ?Patient presents with  ? Right Thigh - Pain  ? ? ?HPI ? ?Alyssa Robinson is a very active and pleasant 71 year old female comes in for evaluation of right proximal right thigh pain.  She is very active and plays golf regularly.  She has been limited by her symptoms.  She has pain when she tries to physical therapy at her office and her home exercises.  She has been limping as a result.  Denies any radicular or back symptoms.  She has not found any improvement in this pain from doing physical therapy at our office. ? ?Review of Systems  ?Constitutional: Negative.   ?HENT: Negative.    ?Eyes: Negative.   ?Respiratory: Negative.    ?Cardiovascular: Negative.   ?Endocrine: Negative.    ?Musculoskeletal: Negative.   ?Neurological: Negative.   ?Hematological: Negative.   ?Psychiatric/Behavioral: Negative.    ?All other systems reviewed and are negative. ? ? ?Objective: ?Vital Signs: There were no vitals taken for this visit. ? ?Physical Exam ?Vitals and nursing note reviewed.  ?Constitutional:   ?   Appearance: She is well-developed.  ?Pulmonary:  ?   Effort: Pulmonary effort is normal.  ?Skin: ?   General: Skin is warm.  ?   Capillary Refill: Capillary refill takes less than 2 seconds.  ?Neurological:  ?   Mental Status: She is alert and oriented to person, place, and time.  ?Psychiatric:     ?   Behavior: Behavior normal.     ?   Thought Content: Thought content normal.     ?   Judgment: Judgment normal.  ? ? ?Ortho Exam ? ?Examination of the right hip shows moderate limitation in range of motion.  There is guarding and pain with passive range of motion.  Pain with Stinchfield test and FADIR. ? ?Specialty Comments:  ?No specialty comments available. ? ?Imaging: ?XR HIP UNILAT W OR W/O PELVIS 2-3 VIEWS RIGHT ? ?Result Date: 06/28/2021 ?Severe joint space narrowing of the right hip with bone-on-bone changes.  No acute abnormalities.  ? ? ?PMFS History: ?Patient Active Problem List  ? Diagnosis  Date Noted  ? Family history of early CAD 07/02/2020  ? Displaced comminuted fracture of left patella, initial encounter for closed fracture 02/20/2020  ? Lipoma of abdominal wall 08/04/2019  ? Osteopenia 01/28/2019  ? Allergic rhinitis 01/28/2019  ? History of shingles 01/28/2019  ? Thrombocytopenia ([place]) 10/17/2017  ? Hyperlipidemia with target LDL less than 130 09/14/2015  ? Vaginal atrophy 06/01/2015  ? Cyst of pancreas 03/06/2015  ? Routine general medical examination at a health care facility 12/11/2012  ? Essential hypertension, benign 12/11/2012  ? Constipation 10/17/2012  ? ?Past Medical History:  ?Diagnosis Date  ? Anemia   ? hx  ? Bronchitis   ? Bronchitis   ? hx  ? Closed patellar sleeve fracture of  left knee   ? Diverticulosis   ? GERD (gastroesophageal reflux disease)   ? occ  ? Headache(784.0)   ? migraines occ  ? Hypertension   ? no meds in over 81yr  ? Seasonal allergies   ?  ?Family History  ?Problem Relation Age of Onset  ? Esophageal cancer Mother   ?     and ovarian cancer  ? Alcohol abuse Mother   ? Arthritis Mother   ? Heart disease Father   ? Alcohol abuse Father   ? Hypertension Father   ? Diabetes Father   ? Early death Father   ? Breast cancer Sister   ? Colon cancer Other   ? Pancreatic cancer Other   ? Stroke Neg Hx   ? Hyperlipidemia Neg Hx   ? Kidney disease Neg Hx   ? COPD Neg Hx   ?  ?Past Surgical History:  ?Procedure Laterality Date  ? ABDOMINAL HYSTERECTOMY    ? 71y/o  ? APPENDECTOMY  03/17/2013  ? Procedure: INCIDENTAL APPENDECTOMY;  Surgeon: JGwenyth Ober MD;  Location: [facility]  Service: General;;  ? capsule endoscopy    ? COLON SURGERY    ? COLONOSCOPY    ? in 2012 -   ? COLOSTOMY CLOSURE  03/17/2013  ? COLOSTOMY REVERSAL  03/17/2013  ? Procedure: COLOSTOMY REVERSAL;  Surgeon: JGwenyth Ober MD;  Location: [facility]  Service: General;;  ? EUS N/A 04/30/2015  ? Procedure: UPPER ENDOSCOPIC ULTRASOUND (EUS) LINEAR;  Surgeon: PCarol Ada MD;  [facility];  Service: Endoscopy;  Laterality: N/A;  ? HERNIA REPAIR    ? X2 2-4 y/o inguinal  ? LAPAROTOMY N/A 10/17/2012  ? Procedure: EXPLORATORY LAPAROTOMY,  COLON RESECTION AND COLOSTOMY;  Surgeon: JGwenyth Ober MD;  Location[facility]  Service: General;  Laterality: N/A;  ? LYSIS OF ADHESION  03/17/2013  ? Procedure: LYSIS OF ADHESION;  Surgeon: JGwenyth Ober MD;  Location[facility]  Service: General;;  ? ORIF PATELLA Left 02/20/2020  ? Procedure: OPEN REDUCTION INTERNAL (ORIF) FIXATION LEFT PATELLA;  Surgeon: XLeandrew Koyanagi MD;  Location: [facility]  Service: Orthopedics;  Laterality: Left;  ? UPPER GASTROINTESTINAL ENDOSCOPY    ? 2013 -  ? ?Social History  ? ?Occupational History  ? Not on file  ?Tobacco Use  ? Smoking  status: Never  ? Smokeless tobacco: Never  ? Tobacco comments:  ?  EXPOSED TO SECOND HAND SMOKE   ?Vaping Use  ? Vaping Use: Never used  ?Substance and Sexual Activity  ? Alcohol use: Not Currently  ?  Alcohol/week: 2.0 standard drinks  ?  Types: 2 Glasses of wine per week  ? Drug use: No  ? Sexual  activity: Not Currently  ?  Birth control/protection: Post-menopausal  ? ? ? ? ? ? ?

## 2021-06-30 ENCOUNTER — Telehealth: Payer: Self-pay | Admitting: Orthopaedic Surgery

## 2021-06-30 NOTE — Telephone Encounter (Signed)
Pt called requesting a call back from Dr. Erlinda Hong. Pt state she has medical questions about gel injections as pre discussed. Please call pt at [phone]. ?

## 2021-06-30 NOTE — Telephone Encounter (Signed)
Sent mychart msg.

## 2021-07-05 ENCOUNTER — Encounter: Payer: Self-pay | Admitting: Internal Medicine

## 2021-07-05 ENCOUNTER — Ambulatory Visit (INDEPENDENT_AMBULATORY_CARE_PROVIDER_SITE_OTHER): Payer: Medicare Other

## 2021-07-05 ENCOUNTER — Other Ambulatory Visit: Payer: Self-pay

## 2021-07-05 ENCOUNTER — Ambulatory Visit (INDEPENDENT_AMBULATORY_CARE_PROVIDER_SITE_OTHER): Payer: Medicare Other | Admitting: Internal Medicine

## 2021-07-05 VITALS — BP 118/60 | HR 73 | Temp 97.8°F | Ht 59.0 in | Wt 112.0 lb

## 2021-07-05 DIAGNOSIS — E785 Hyperlipidemia, unspecified: Secondary | ICD-10-CM

## 2021-07-05 DIAGNOSIS — E559 Vitamin D deficiency, unspecified: Secondary | ICD-10-CM | POA: Diagnosis not present

## 2021-07-05 DIAGNOSIS — R350 Frequency of micturition: Secondary | ICD-10-CM | POA: Insufficient documentation

## 2021-07-05 DIAGNOSIS — Z0000 Encounter for general adult medical examination without abnormal findings: Secondary | ICD-10-CM | POA: Diagnosis not present

## 2021-07-05 DIAGNOSIS — R8290 Unspecified abnormal findings in urine: Secondary | ICD-10-CM | POA: Diagnosis not present

## 2021-07-05 DIAGNOSIS — I10 Essential (primary) hypertension: Secondary | ICD-10-CM

## 2021-07-05 DIAGNOSIS — M8580 Other specified disorders of bone density and structure, unspecified site: Secondary | ICD-10-CM | POA: Diagnosis not present

## 2021-07-05 DIAGNOSIS — D696 Thrombocytopenia, unspecified: Secondary | ICD-10-CM | POA: Diagnosis not present

## 2021-07-05 LAB — POCT URINALYSIS DIPSTICK
Bilirubin, UA: NEGATIVE
Glucose, UA: NEGATIVE
Ketones, UA: NEGATIVE
Leukocytes, UA: NEGATIVE
Nitrite, UA: NEGATIVE
Protein, UA: NEGATIVE
Spec Grav, UA: <=1.005 — AB (ref 1.010–1.025)
Urobilinogen, UA: NEGATIVE E.U./dL — AB
pH, UA: 7.5 (ref 5.0–8.0)

## 2021-07-05 LAB — COMPREHENSIVE METABOLIC PANEL
ALT: 15 U/L (ref 0–35)
AST: 23 U/L (ref 0–37)
Albumin: 4.3 g/dL (ref 3.5–5.2)
Alkaline Phosphatase: 105 U/L (ref 39–117)
BUN: 17 mg/dL (ref 6–23)
CO2: 30 mEq/L (ref 19–32)
Calcium: 9.6 mg/dL (ref 8.4–10.5)
Chloride: 104 mEq/L (ref 96–112)
Creatinine, Ser: 0.65 mg/dL (ref 0.40–1.20)
GFR: 88.12 mL/min (ref >60.00)
Glucose, Bld: 90 mg/dL (ref 70–99)
Potassium: 4 mEq/L (ref 3.5–5.1)
Sodium: 141 mEq/L (ref 135–145)
Total Bilirubin: 0.8 mg/dL (ref 0.2–1.2)
Total Protein: 7 g/dL (ref 6.0–8.3)

## 2021-07-05 LAB — CBC
HCT: 47.6 % — ABNORMAL HIGH (ref 36.0–46.0)
Hemoglobin: 15.8 g/dL — ABNORMAL HIGH (ref 12.0–15.0)
MCHC: 33.2 g/dL (ref 30.0–36.0)
MCV: 97.2 fl (ref 78.0–100.0)
Platelets: 151 10*3/uL (ref 150.0–400.0)
RBC: 4.89 Mil/uL (ref 3.87–5.11)
RDW: 14.7 % (ref 11.5–15.5)
WBC: 4.9 10*3/uL (ref 4.0–10.5)

## 2021-07-05 LAB — LIPID PANEL
Cholesterol: 197 mg/dL (ref 0–200)
HDL: 69.3 mg/dL (ref >39.00)
LDL Cholesterol: 112 mg/dL — ABNORMAL HIGH (ref 0–99)
NonHDL: 127.21
Total CHOL/HDL Ratio: 3
Triglycerides: 77 mg/dL (ref 0.0–149.0)
VLDL: 15.4 mg/dL (ref 0.0–40.0)

## 2021-07-05 LAB — VITAMIN D 25 HYDROXY (VIT D DEFICIENCY, FRACTURES): VITD: 50.88 ng/mL (ref 30.00–100.00)

## 2021-07-05 NOTE — Progress Notes (Signed)
? ?  Subjective:  ? ?Patient ID: Alyssa Robinson, female    DOB: [birth year], 71 y.o.   MRN: [id number] ? ?HPI ?The patient is a 71 YO female coming in for follow up and concerns.  ? ?Review of Systems  ?Constitutional: Negative.   ?HENT: Negative.    ?Eyes: Negative.   ?Respiratory:  Negative for cough, chest tightness and shortness of breath.   ?Cardiovascular:  Negative for chest pain, palpitations and leg swelling.  ?Gastrointestinal:  Negative for abdominal distention, abdominal pain, constipation, diarrhea, nausea and vomiting.  ?Musculoskeletal:  Positive for arthralgias.  ?Skin: Negative.   ?Neurological: Negative.   ?Psychiatric/Behavioral: Negative.    ? ?Objective:  ?Physical Exam ?Constitutional:   ?   Appearance: She is well-developed.  ?HENT:  ?   Head: Normocephalic and atraumatic.  ?Cardiovascular:  ?   Rate and Rhythm: Normal rate and regular rhythm.  ?Pulmonary:  ?   Effort: Pulmonary effort is normal. No respiratory distress.  ?   Breath sounds: Normal breath sounds. No wheezing or rales.  ?Abdominal:  ?   General: Bowel sounds are normal. There is no distension.  ?   Palpations: Abdomen is soft.  ?   Tenderness: There is no abdominal tenderness. There is no rebound.  ?Musculoskeletal:  ?   Cervical back: Normal range of motion.  ?Skin: ?   General: Skin is warm and dry.  ?Neurological:  ?   Mental Status: She is alert and oriented to person, place, and time.  ?   Coordination: Coordination normal.  ? ? ?Vitals:  ? 07/05/21 0925  ?BP: 118/60  ?Pulse: 73  ?Temp: 97.8 ?F (36.6 ?C)  ?TempSrc: Oral  ?SpO2: 95%  ?Weight: 112 lb (50.8 kg)  ?Height: '4\' 11"'$  (1.499 m)  ? ?EKG: Rate 63, axis normal, interval normal, sinus, no st or t wave changes, no significant change compared to prior 2016 ? ? ?This visit occurred during the SARS-CoV-2 public health emergency.  Safety protocols were in place, including screening questions prior to the visit, additional usage of staff PPE, and extensive cleaning of exam room  while observing appropriate contact time as indicated for disinfecting solutions.  ? ?Assessment & Plan:  ? ?

## 2021-07-05 NOTE — Patient Instructions (Addendum)
We have done the EKG today which is normal ? ?

## 2021-07-05 NOTE — Assessment & Plan Note (Signed)
Checking vitamin D level today. Due for dexa 2025 ?

## 2021-07-05 NOTE — Assessment & Plan Note (Signed)
POC U/A done in office without signs of infection. Encouraged to continue drinking plenty of fluids.  ?

## 2021-07-05 NOTE — Patient Instructions (Signed)
Alyssa Robinson , ?Thank you for taking time to come for your Medicare Wellness Visit. I appreciate your ongoing commitment to your health goals. Please review the following plan we discussed and let me know if I can assist you in the future.  ? ?Screening recommendations/referrals: ?Colonoscopy: 12/11/2018; due every 10 years ?Mammogram: 06/12/2020; due every 1-2 years ?Bone Density: 01/28/2019; due every 2 years ?Recommended yearly ophthalmology/optometry visit for glaucoma screening and checkup ?Recommended yearly dental visit for hygiene and checkup ? ?Vaccinations: ?Influenza vaccine: 01/16/2021 ?Pneumococcal vaccine: 05/13/2014, 09/14/2015 ?Tdap vaccine: 09/14/2015; due every 10 years ?Shingles vaccine: 02/18/2019, 04/30/2019   ?Covid-19: 05/31/2019, 06/25/2019, 04/29/2020 ? ?Advanced directives: Please bring a copy of your health care power of attorney and living will to the office at your convenience. ? ?Conditions/risks identified: Yes; Client understands the importance of follow-up appointments with providers by attending scheduled visits and discussed goals to eat healthier, increase physical activity 5 times a week for 30 minutes each, exercise the brain by doing stimulating brain exercises (reading, adult coloring, crafting, listening to music, puzzles, etc.), socialize and enjoy life more, get enough sleep at least 8-9 hours average per night and make time for laughter. ? ? ?Next appointment: Please schedule your next Medicare Wellness Visit with your Nurse Health Advisor in 1 year by calling [phone]. ? ? ?Preventive Care 16 Years and Older, Female ?Preventive care refers to lifestyle choices and visits with your health care provider that can promote health and wellness. ?What does preventive care include? ?A yearly physical exam. This is also called an annual well check. ?Dental exams once or twice a year. ?Routine eye exams. Ask your health care provider how often you should have your eyes checked. ?Personal  lifestyle choices, including: ?Daily care of your teeth and gums. ?Regular physical activity. ?Eating a healthy diet. ?Avoiding tobacco and drug use. ?Limiting alcohol use. ?Practicing safe sex. ?Taking low-dose aspirin every day. ?Taking vitamin and mineral supplements as recommended by your health care provider. ?What happens during an annual well check? ?The services and screenings done by your health care provider during your annual well check will depend on your age, overall health, lifestyle risk factors, and family history of disease. ?Counseling  ?Your health care provider may ask you questions about your: ?Alcohol use. ?Tobacco use. ?Drug use. ?Emotional well-being. ?Home and relationship well-being. ?Sexual activity. ?Eating habits. ?History of falls. ?Memory and ability to understand (cognition). ?Work and work Statistician. ?Reproductive health. ?Screening  ?You may have the following tests or measurements: ?Height, weight, and BMI. ?Blood pressure. ?Lipid and cholesterol levels. These may be checked every 5 years, or more frequently if you are over 88 years old. ?Skin check. ?Lung cancer screening. You may have this screening every year starting at age 61 if you have a 30-pack-year history of smoking and currently smoke or have quit within the past 15 years. ?Fecal occult blood test (FOBT) of the stool. You may have this test every year starting at age 46. ?Flexible sigmoidoscopy or colonoscopy. You may have a sigmoidoscopy every 5 years or a colonoscopy every 10 years starting at age 74. ?Hepatitis C blood test. ?Hepatitis B blood test. ?Sexually transmitted disease (STD) testing. ?Diabetes screening. This is done by checking your blood sugar (glucose) after you have not eaten for a while (fasting). You may have this done every 1-3 years. ?Bone density scan. This is done to screen for osteoporosis. You may have this done starting at age 38. ?Mammogram. This may be done every 1-2  years. Talk to your  health care provider about how often you should have regular mammograms. ?Talk with your health care provider about your test results, treatment options, and if necessary, the need for more tests. ?Vaccines  ?Your health care provider may recommend certain vaccines, such as: ?Influenza vaccine. This is recommended every year. ?Tetanus, diphtheria, and acellular pertussis (Tdap, Td) vaccine. You may need a Td booster every 10 years. ?Zoster vaccine. You may need this after age 37. ?Pneumococcal 13-valent conjugate (PCV13) vaccine. One dose is recommended after age 35. ?Pneumococcal polysaccharide (PPSV23) vaccine. One dose is recommended after age 108. ?Talk to your health care provider about which screenings and vaccines you need and how often you need them. ?This information is not intended to replace advice given to you by your health care provider. Make sure you discuss any questions you have with your health care provider. ?Document Released: 05/07/2015 Document Revised: 12/29/2015 Document Reviewed: 02/09/2015 ?Elsevier Interactive Patient Education ? 2017 [place]. ? ?Fall Prevention in the Home ?Falls can cause injuries. They can happen to people of all ages. There are many things you can do to make your home safe and to help prevent falls. ?What can I do on the outside of my home? ?Regularly fix the edges of walkways and driveways and fix any cracks. ?Remove anything that might make you trip as you walk through a door, such as a raised step or threshold. ?Trim any bushes or trees on the path to your home. ?Use bright outdoor lighting. ?Clear any walking paths of anything that might make someone trip, such as rocks or tools. ?Regularly check to see if handrails are loose or broken. Make sure that both sides of any steps have handrails. ?Any raised decks and porches should have guardrails on the edges. ?Have any leaves, snow, or ice cleared regularly. ?Use sand or salt on walking paths during winter. ?Clean  up any spills in your garage right away. This includes oil or grease spills. ?What can I do in the bathroom? ?Use night lights. ?Install grab bars by the toilet and in the tub and shower. Do not use towel bars as grab bars. ?Use non-skid mats or decals in the tub or shower. ?If you need to sit down in the shower, use a plastic, non-slip stool. ?Keep the floor dry. Clean up any water that spills on the floor as soon as it happens. ?Remove soap buildup in the tub or shower regularly. ?Attach bath mats securely with double-sided non-slip rug tape. ?Do not have throw rugs and other things on the floor that can make you trip. ?What can I do in the bedroom? ?Use night lights. ?Make sure that you have a light by your bed that is easy to reach. ?Do not use any sheets or blankets that are too big for your bed. They should not hang down onto the floor. ?Have a firm chair that has side arms. You can use this for support while you get dressed. ?Do not have throw rugs and other things on the floor that can make you trip. ?What can I do in the kitchen? ?Clean up any spills right away. ?Avoid walking on wet floors. ?Keep items that you use a lot in easy-to-reach places. ?If you need to reach something above you, use a strong step stool that has a grab bar. ?Keep electrical cords out of the way. ?Do not use floor polish or wax that makes floors slippery. If you must use wax, use non-skid floor  wax. ?Do not have throw rugs and other things on the floor that can make you trip. ?What can I do with my stairs? ?Do not leave any items on the stairs. ?Make sure that there are handrails on both sides of the stairs and use them. Fix handrails that are broken or loose. Make sure that handrails are as long as the stairways. ?Check any carpeting to make sure that it is firmly attached to the stairs. Fix any carpet that is loose or worn. ?Avoid having throw rugs at the top or bottom of the stairs. If you do have throw rugs, attach them to the  floor with carpet tape. ?Make sure that you have a light switch at the top of the stairs and the bottom of the stairs. If you do not have them, ask someone to add them for you. ?What else can I do to help prevent

## 2021-07-05 NOTE — Progress Notes (Signed)
? ?Subjective:  ? Alyssa Robinson is a 71 y.o. female who presents for Medicare Annual (Subsequent) preventive examination. ? ?Review of Systems    ? ?Cardiac Risk Factors include: advanced age (>51mn, >>58women);dyslipidemia;family history of premature cardiovascular disease;hypertension ? ?   ?Objective:  ?  ?Today's Vitals  ? 07/05/21 0836  ?BP: 118/60  ?Pulse: 73  ?Temp: 97.8 ?F (36.6 ?C)  ?SpO2: 95%  ?Weight: 112 lb (50.8 kg)  ?Height: '4\' 11"'$  (1.499 m)  ?PainSc: 0-No pain  ? ?Body mass index is 22.62 kg/m?. ? ?Advanced Directives 07/05/2021 01/10/2021 07/01/2020 02/20/2020 02/17/2020 02/14/2020 06/09/2018  ?Does Patient Have a Medical Advance Directive? Yes Yes Yes Yes Yes Yes No  ?Type of Advance Directive Living will;Healthcare Power of [place];Out of facility DNR (pink MOST or yellow form) Living will;Healthcare Power of Attorney - Living will;Healthcare Power of [place] -  ?Does patient want to make changes to medical advance directive? No - Patient declined - No - Patient declined No - Patient declined No - Patient declined - -  ?Copy of [place] Chart? No - copy requested No - copy requested No - copy requested - No - copy requested - -  ?Would patient like information on creating a medical advance directive? - - - - - - No - Patient declined  ? ? ?Current Medications (verified) ?Outpatient Encounter Medications as of 07/05/2021  ?Medication Sig  ? fexofenadine (ALLEGRA) 180 MG tablet Take 180 mg by mouth daily as needed for allergies.   ? Pediatric Multiple Vit-C-FA (PEDIATRIC MULTIVITAMIN) chewable tablet Chew by mouth.  ? Probiotic Product ([place] 170 MG CAPS Take by mouth.  ? sodium chloride (OCEAN) 0.65 % SOLN nasal spray Place 1 spray into both nostrils as needed for congestion.  ? [DISCONTINUED] ciclopirox (LOPROX) 0.77 % SUSP Apply topically 2 (two) times daily.  ?  [DISCONTINUED] Tart Cherry (TART CHERRY ULTRA) 1200 MG CAPS Take 1 capsule by mouth daily. Take with meal before bedtime  ? ?No facility-administered encounter medications on file as of 07/05/2021.  ? ? ?Allergies (verified) ?Caffeine and Sodium lauryl sulfate  ? ?History: ?Past Medical History:  ?Diagnosis Date  ? Anemia   ? hx  ? Bronchitis   ? Bronchitis   ? hx  ? Closed patellar sleeve fracture of left knee   ? Diverticulosis   ? GERD (gastroesophageal reflux disease)   ? occ  ? Headache(784.0)   ? migraines occ  ? Hypertension   ? no meds in over 662yr ? Seasonal allergies   ? ?Past Surgical History:  ?Procedure Laterality Date  ? ABDOMINAL HYSTERECTOMY    ? [age]/o  ? APPENDECTOMY  03/17/2013  ? Procedure: INCIDENTAL APPENDECTOMY;  Surgeon: JaGwenyth OberMD;  [facility]: General;;  ? capsule endoscopy    ? COLON SURGERY    ? COLONOSCOPY    ? in 2012 -   ? COLOSTOMY CLOSURE  03/17/2013  ? COLOSTOMY REVERSAL  03/17/2013  ? Procedure: COLOSTOMY REVERSAL;  Surgeon: JaGwenyth OberMD;  [facility]: [facility];;  ? EUS N/A 04/30/2015  ? Procedure: UPPER ENDOSCOPIC ULTRASOUND (EUS) LINEAR;  Surgeon: PaCarol AdaMD;  Location: [facility];  Service: Endoscopy;  Laterality: N/A;  ? HERNIA REPAIR    ? X2 2-4 y/o inguinal  ? LAPAROTOMY N/A 10/17/2012  ? Procedure: EXPLORATORY LAPAROTOMY,  COLON RESECTION AND COLOSTOMY;  Surgeon: Gwenyth Ober, MD;  Location: [facility];  Service: General;  Laterality: N/A;  ? LYSIS OF ADHESION  03/17/2013  ? Procedure: LYSIS OF ADHESION;  Surgeon: Gwenyth Ober, MD;  Location: [facility];  Service: General;;  ? ORIF PATELLA Left 02/20/2020  ? Procedure: OPEN REDUCTION INTERNAL (ORIF) FIXATION LEFT PATELLA;  Surgeon: Leandrew Koyanagi, MD;  Location: [facility];  Service: Orthopedics;  Laterality: Left;  ? UPPER GASTROINTESTINAL ENDOSCOPY    ? 2013 -  ? ?Family History  ?Problem Relation Age of Onset  ? Esophageal cancer Mother   ?     and ovarian cancer  ?  Alcohol abuse Mother   ? Arthritis Mother   ? Heart disease Father   ? Alcohol abuse Father   ? Hypertension Father   ? Diabetes Father   ? Early death Father   ? Breast cancer Sister   ? Colon cancer Other   ? Pancreatic cancer Other   ? Stroke Neg Hx   ? Hyperlipidemia Neg Hx   ? Kidney disease Neg Hx   ? COPD Neg Hx   ? ?Social History  ? ?Socioeconomic History  ? Marital status: Married  ?  Spouse name: Not on file  ? Number of children: Not on file  ? Years of education: Not on file  ? Highest education level: Not on file  ?Occupational History  ? Not on file  ?Tobacco Use  ? Smoking status: Never  ? Smokeless tobacco: Never  ? Tobacco comments:  ?  EXPOSED TO SECOND HAND SMOKE   ?Vaping Use  ? Vaping Use: Never used  ?Substance and Sexual Activity  ? Alcohol use: Not Currently  ?  Alcohol/week: 2.0 standard drinks  ?  Types: 2 Glasses of wine per week  ? Drug use: No  ? Sexual activity: Not Currently  ?  Birth control/protection: Post-menopausal  ?Other Topics Concern  ? Not on file  ?Social History Narrative  ? Not on file  ? ?Social Determinants of Health  ? ?Financial Resource Strain: Low Risk   ? Difficulty of Paying Living Expenses: Not hard at all  ?Food Insecurity: No Food Insecurity  ? Worried About Charity fundraiser in the Last Year: Never true  ? Ran Out of Food in the Last Year: Never true  ?Transportation Needs: No Transportation Needs  ? Lack of Transportation (Medical): No  ? Lack of Transportation (Non-Medical): No  ?Physical Activity: Sufficiently Active  ? Days of Exercise per Week: 7 days  ? Minutes of Exercise per Session: 30 min  ?Stress: No Stress Concern Present  ? Feeling of Stress : Not at all  ?Social Connections: Socially Integrated  ? Frequency of Communication with Friends and Family: More than three times a week  ? Frequency of Social Gatherings with Friends and Family: More than three times a week  ? Attends Religious Services: More than 4 times per year  ? Active Member of  Clubs or Organizations: Yes  ? Attends Archivist Meetings: More than 4 times per year  ? Marital Status: Married  ? ? ?Tobacco Counseling ?Counseling given: Not Answered ?Tobacco comments: EXPOSED TO SECOND HAND SMOKE  ? ? ?Clinical Intake: ? ?Pre-visit preparation completed: Yes ? ?Pain : No/denies pain ?Pain Score: 0-No pain ? ?  ? ?BMI - recorded: 22.62 ?Nutritional Status: BMI of 19-24  Normal ?Nutritional Risks: None ?Diabetes: No ? ?How often do you need to have someone help  you when you read instructions, pamphlets, or other written materials from your doctor or pharmacy?: 1 - Never ?What is the last grade level you completed in school?: Bachelor's Degree ? ?Diabetic? no ? ?Interpreter Needed?: No ? ?Information entered by :: Lisette Abu, LPN ? ? ?Activities of Daily Living ?In your present state of health, do you have any difficulty performing the following activities: 07/05/2021  ?Hearing? N  ?Vision? N  ?Difficulty concentrating or making decisions? N  ?Walking or climbing stairs? N  ?Dressing or bathing? N  ?Doing errands, shopping? N  ?Preparing Food and eating ? N  ?Using the Toilet? N  ?In the past six months, have you accidently leaked urine? N  ?Do you have problems with loss of bowel control? N  ?Managing your Medications? N  ?Managing your Finances? N  ?Housekeeping or managing your Housekeeping? N  ?Some recent data might be hidden  ? ? ?Patient Care Team: ?Hoyt Koch, MD as PCP - General (Internal Medicine) ? ?Indicate any recent Medical Services you may have received from other than [facility] providers in the past year (date may be approximate). ? ?   ?Assessment:  ? This is a routine wellness examination for [facility]. ? ?Hearing/Vision screen ?Hearing Screening - Comments:: Patient denied any hearing difficulty.   ?No hearing aids. ? ?Vision Screening - Comments:: Patient does wear corrective lenses/contacts.   ?Eye exam done by: Beatrix Fetters, MD. ? ?Dietary issues and  exercise activities discussed: ?Current Exercise Habits: Home exercise routine, Type of exercise: walking, Time (Minutes): 30, Frequency (Times/Week): 5, Weekly Exercise (Minutes/Week): 150, Intensity: Mild ? ? Goals Addressed

## 2021-07-05 NOTE — Assessment & Plan Note (Signed)
Checking CBC for any change. Assess if needed. If stable no further workup needed. If decreasing would need further assessment.  ?

## 2021-07-05 NOTE — Assessment & Plan Note (Signed)
BP at goal off medications. Continue to monitor closely. Checking CMP and CBC EKG done today for monitoring purposes and stable. Low risk to proceed with right hip replacement without further testing if labs normal. ?

## 2021-07-05 NOTE — Assessment & Plan Note (Signed)
Currently diet controlled and checking lipid panel to ensure she is still on track. She has not been exercising as much due to hip pain. Adjust as needed.  ?

## 2021-07-19 DIAGNOSIS — Z20822 Contact with and (suspected) exposure to covid-19: Secondary | ICD-10-CM | POA: Diagnosis not present

## 2021-08-18 DIAGNOSIS — Z20822 Contact with and (suspected) exposure to covid-19: Secondary | ICD-10-CM | POA: Diagnosis not present

## 2021-09-02 NOTE — Progress Notes (Signed)
Surgical Instructions ? ? ? Your procedure is scheduled on Monday may 22nd. ? Report to [facility] Main Entrance "A" at 7 A.M., then check in with the Admitting office. ? Call this number if you have problems the morning of surgery: ? [phone] ? ? If you have any questions prior to your surgery date call [phone]: Open Monday-Friday 8am-4pm ? ? ? Remember: ? Do not eat after midnight the night before your surgery ? ?You may drink clear liquids until 6am the morning of your surgery.   ?Clear liquids allowed are: Water, Non-Citrus Juices (without pulp), Carbonated Beverages, Clear Tea, Black Coffee ONLY (NO MILK, CREAM OR POWDERED CREAMER of any kind), and Gatorade ? ? ?Enhanced Recovery after Surgery for Orthopedics ?Enhanced Recovery after Surgery is a protocol used to improve the stress on your body and your recovery after surgery. ? ?Patient Instructions ? ?The day of surgery (if you do NOT have diabetes):  ?Drink ONE (1) Pre-Surgery Clear Ensure by ___6__ am the morning of surgery   ?This drink was given to you during your hospital  ?pre-op appointment visit. ?Nothing else to drink after completing the  ?Pre-Surgery Clear Ensure. ? ? ?       If you have questions, please contact your surgeon?s office. ? ?  ? Take these medicines the morning of surgery :  ?sodium chloride (OCEAN) 0.65 % SOLN nasal spray if needed ? ?As of today, STOP taking any Aspirin (unless otherwise instructed by your surgeon) Aleve, Naproxen, Ibuprofen, Motrin, Advil, Goody's, BC's, all herbal medications, fish oil, and all vitamins. ? ?         ?Do not wear jewelry or makeup ?Do not wear lotions, powders, perfumes, or deodorant. ?Do not shave 48 hours prior to surgery.   ?Do not bring valuables to the hospital. ?Do not wear nail polish, gel polish, artificial nails, or any other type of covering on natural nails (fingers and toes) ?If you have artificial nails or gel coating that need to be removed by a nail salon, please have this  removed prior to surgery. Artificial nails or gel coating may interfere with anesthesia's ability to adequately monitor your vital signs. ? ?[facility] is not responsible for any belongings or valuables. .  ? ?Do NOT Smoke (Tobacco/Vaping)  24 hours prior to your procedure ? ?If you use a CPAP at night, you may bring your mask for your overnight stay. ?  ?Contacts, glasses, hearing aids, dentures or partials may not be worn into surgery, please bring cases for these belongings ?  ?For patients admitted to the hospital, discharge time will be determined by your treatment team. ?  ?Patients discharged the day of surgery will not be allowed to drive home, and someone needs to stay with them for 24 hours. ? ? ?SURGICAL WAITING ROOM VISITATION ?Patients having surgery or a procedure in a hospital may have two support people. ?Children under the age of 61 must have an adult with them who is not the patient. ?They may stay in the waiting area during the procedure and may switch out with other visitors. If the patient needs to stay at the hospital during part of their recovery, the visitor guidelines for inpatient rooms apply. ? ?Please refer to the [facility] website for the visitor guidelines for Inpatients (after your surgery is over and you are in a regular room).  ? ? ? ? ? ?Special instructions:   ? ?Oral Hygiene is also important to reduce your risk of  infection.  Remember - BRUSH YOUR TEETH THE MORNING OF SURGERY WITH YOUR REGULAR TOOTHPASTE ? ? ?[facility]- Preparing For Surgery ? ?Before surgery, you can play an important role. Because skin is not sterile, your skin needs to be as free of germs as possible. You can reduce the number of germs on your skin by washing with CHG (chlorahexidine gluconate) Soap before surgery.  CHG is an antiseptic cleaner which kills germs and bonds with the skin to continue killing germs even after washing.   ? ? ?Please do not use if you have an allergy to CHG or antibacterial  soaps. If your skin becomes reddened/irritated stop using the CHG.  ?Do not shave (including legs and underarms) for at least 48 hours prior to first CHG shower. It is OK to shave your face. ? ?Please follow these instructions carefully. ?  ? ? Shower the NIGHT BEFORE SURGERY and the MORNING OF SURGERY with CHG Soap.  ? If you chose to wash your hair, wash your hair first as usual with your normal shampoo. After you shampoo, rinse your hair and body thoroughly to remove the shampoo.  Then [employer] and genitals (private parts) with your normal soap and rinse thoroughly to remove soap. ? ?After that Use CHG Soap as you would any other liquid soap. You can apply CHG directly to the skin and wash gently with a scrungie or a clean washcloth.  ? ?Apply the CHG Soap to your body ONLY FROM THE NECK DOWN.  Do not use on open wounds or open sores. Avoid contact with your eyes, ears, mouth and genitals (private parts). Wash Face and genitals (private parts)  with your normal soap.  ? ?Wash thoroughly, paying special attention to the area where your surgery will be performed. ? ?Thoroughly rinse your body with warm water from the neck down. ? ?DO NOT shower/wash with your normal soap after using and rinsing off the CHG Soap. ? ?Pat yourself dry with a CLEAN TOWEL. ? ?Wear CLEAN PAJAMAS to bed the night before surgery ? ?Place CLEAN SHEETS on your bed the night before your surgery ? ?DO NOT SLEEP WITH PETS. ? ? ?Day of Surgery: ? ?Take a shower with CHG soap. ?Wear Clean/Comfortable clothing the morning of surgery ?Do not apply any deodorants/lotions.   ?Remember to brush your teeth WITH YOUR REGULAR TOOTHPASTE. ? ? ? ?If you received a COVID test during your pre-op visit, it is requested that you wear a mask when out in public, stay away from anyone that may not be feeling well, and notify your surgeon if you develop symptoms. If you have been in contact with anyone that has tested positive in the last 10 days, please notify  your surgeon. ? ?  ?Please read over the following fact sheets that you were given.  ? ?

## 2021-09-05 ENCOUNTER — Encounter (HOSPITAL_COMMUNITY)
Admission: RE | Admit: 2021-09-05 | Discharge: 2021-09-05 | Disposition: A | Discharge status: Home / Self Care | Payer: Medicare Other | Source: Ambulatory Visit | Attending: Orthopaedic Surgery | Admitting: Orthopaedic Surgery

## 2021-09-05 ENCOUNTER — Encounter (HOSPITAL_COMMUNITY): Payer: Self-pay

## 2021-09-05 ENCOUNTER — Other Ambulatory Visit: Payer: Self-pay | Admitting: Physician Assistant

## 2021-09-05 ENCOUNTER — Other Ambulatory Visit: Payer: Self-pay

## 2021-09-05 VITALS — BP 152/79 | HR 79 | Temp 97.5°F | Resp 17 | Ht 59.0 in | Wt 108.6 lb

## 2021-09-05 DIAGNOSIS — I10 Essential (primary) hypertension: Secondary | ICD-10-CM | POA: Insufficient documentation

## 2021-09-05 DIAGNOSIS — M1611 Unilateral primary osteoarthritis, right hip: Secondary | ICD-10-CM | POA: Insufficient documentation

## 2021-09-05 DIAGNOSIS — Z01812 Encounter for preprocedural laboratory examination: Secondary | ICD-10-CM | POA: Diagnosis not present

## 2021-09-05 DIAGNOSIS — Z01818 Encounter for other preprocedural examination: Secondary | ICD-10-CM

## 2021-09-05 HISTORY — DX: Pneumonia, unspecified organism: J18.9

## 2021-09-05 LAB — COMPREHENSIVE METABOLIC PANEL
ALT: 14 U/L (ref 0–44)
AST: 24 U/L (ref 15–41)
Albumin: 4.1 g/dL (ref 3.5–5.0)
Alkaline Phosphatase: 96 U/L (ref 38–126)
Anion gap: 8 (ref 5–15)
BUN: 15 mg/dL (ref 8–23)
CO2: 29 mmol/L (ref 22–32)
Calcium: 9.5 mg/dL (ref 8.9–10.3)
Chloride: 102 mmol/L (ref 98–111)
Creatinine, Ser: 0.74 mg/dL (ref 0.44–1.00)
GFR, Estimated: >60 mL/min (ref >60)
Glucose, Bld: 79 mg/dL (ref 70–99)
Potassium: 4.3 mmol/L (ref 3.5–5.1)
Sodium: 139 mmol/L (ref 135–145)
Total Bilirubin: 1 mg/dL (ref 0.3–1.2)
Total Protein: 7.3 g/dL (ref 6.5–8.1)

## 2021-09-05 LAB — TYPE AND SCREEN
ABO/RH(D): O POS
Antibody Screen: NEGATIVE

## 2021-09-05 LAB — CBC WITH DIFFERENTIAL/PLATELET
Abs Immature Granulocytes: 0.01 10*3/uL (ref 0.00–0.07)
Basophils Absolute: 0.1 10*3/uL (ref 0.0–0.1)
Basophils Relative: 1 %
Eosinophils Absolute: 0.1 10*3/uL (ref 0.0–0.5)
Eosinophils Relative: 2 %
HCT: 49.7 % — ABNORMAL HIGH (ref 36.0–46.0)
Hemoglobin: 16.7 g/dL — ABNORMAL HIGH (ref 12.0–15.0)
Immature Granulocytes: 0 %
Lymphocytes Relative: 29 %
Lymphs Abs: 1.2 10*3/uL (ref 0.7–4.0)
MCH: 32.4 pg (ref 26.0–34.0)
MCHC: 33.6 g/dL (ref 30.0–36.0)
MCV: 96.3 fL (ref 80.0–100.0)
Monocytes Absolute: 0.7 10*3/uL (ref 0.1–1.0)
Monocytes Relative: 18 %
Neutro Abs: 2.1 10*3/uL (ref 1.7–7.7)
Neutrophils Relative %: 50 %
Platelets: 167 10*3/uL (ref 150–400)
RBC: 5.16 MIL/uL — ABNORMAL HIGH (ref 3.87–5.11)
RDW: 13.5 % (ref 11.5–15.5)
WBC: 4.2 10*3/uL (ref 4.0–10.5)
nRBC: 0 % (ref 0.0–0.2)

## 2021-09-05 LAB — SURGICAL PCR SCREEN
MRSA, PCR: NEGATIVE
Staphylococcus aureus: NEGATIVE

## 2021-09-05 MED ORDER — ASPIRIN EC 81 MG PO TBEC: 81.0000 mg | DELAYED_RELEASE_TABLET | Freq: Two times a day (BID) | ORAL | 0 refills | Status: DC

## 2021-09-05 MED ORDER — METHOCARBAMOL 500 MG PO TABS: 500.0000 mg | ORAL_TABLET | Freq: Two times a day (BID) | ORAL | 2 refills | Status: DC | PRN

## 2021-09-05 MED ORDER — OXYCODONE-ACETAMINOPHEN 5-325 MG PO TABS: 1.0000 | ORAL_TABLET | Freq: Four times a day (QID) | ORAL | 0 refills | Status: DC | PRN

## 2021-09-05 MED ORDER — ONDANSETRON HCL 4 MG PO TABS: 4.0000 mg | ORAL_TABLET | Freq: Three times a day (TID) | ORAL | 0 refills | Status: DC | PRN

## 2021-09-05 MED ORDER — DOCUSATE SODIUM 100 MG PO CAPS: 100.0000 mg | ORAL_CAPSULE | Freq: Every day | ORAL | 2 refills | Status: DC | PRN

## 2021-09-05 NOTE — Progress Notes (Signed)
PCP - Real Cons. Crawford ?Cardiologist - denies ? ?PPM/ICD - denies ?Device Orders -  ?Rep Notified -  ? ?Chest x-ray - na ?EKG - 07/05/21 ?Stress Test - 12/06/14 ?ECHO - denies ?Cardiac Cath - denies ? ?Sleep Study - denies ?CPAP -  ? ?Fasting Blood Sugar - na ?Checks Blood Sugar _____ times a day ? ?Blood Thinner Instructions:na ?Aspirin Instructions:na ? ?ERAS Protcol -clear liquids until 0600 ?PRE-SURGERY Ensure or G2- Ensure ? ?COVID TEST- na ? ? ?Anesthesia review: no ? ?Patient denies shortness of breath, fever, cough and chest pain at PAT appointment ? ? ?All instructions explained to the patient, with a verbal understanding of the material. Patient agrees to go over the instructions while at home for a better understanding. Patient also instructed to wear a mask when out in public prior to surgery. The opportunity to ask questions was provided. ?  ?

## 2021-09-05 NOTE — Progress Notes (Addendum)
Surgical Instructions ? ? ? Your procedure is scheduled on Monday may 22nd. ? Report to [facility] Main Entrance "A" at 6:30am, then check in with the Admitting office. ? Call this number if you have problems the morning of surgery: ? [phone] ? ? If you have any questions prior to your surgery date call [phone]: Open Monday-Friday 8am-4pm ? ? ? Remember: ? Do not eat after midnight the night before your surgery ? ?You may drink clear liquids until 6am the morning of your surgery.   ?Clear liquids allowed are: Water, Non-Citrus Juices (without pulp), Carbonated Beverages, Clear Tea, Black Coffee ONLY (NO MILK, CREAM OR POWDERED CREAMER of any kind), and Gatorade ? ? ?Enhanced Recovery after Surgery for Orthopedics ?Enhanced Recovery after Surgery is a protocol used to improve the stress on your body and your recovery after surgery. ? ?Patient Instructions ? ?The day of surgery (if you do NOT have diabetes):  ?Drink ONE (1) Pre-Surgery Clear Ensure by ___6__ am the morning of surgery   ?This drink was given to you during your hospital  ?pre-op appointment visit. ?Nothing else to drink after completing the  ?Pre-Surgery Clear Ensure. ? ? ?       If you have questions, please contact your surgeon?s office. ? ?  ? Take these medicines the morning of surgery :  ?sodium chloride (OCEAN) 0.65 % SOLN nasal spray if needed ? ?As of today, STOP taking any Aspirin (unless otherwise instructed by your surgeon) Aleve, Naproxen, Ibuprofen, Motrin, Advil, Goody's, BC's, all herbal medications, fish oil, and all vitamins. ? ?         ?Do not wear jewelry or makeup ?Do not wear lotions, powders, perfumes, or deodorant. ?Do not shave 48 hours prior to surgery.   ?Do not bring valuables to the hospital. ?Do not wear nail polish, gel polish, artificial nails, or any other type of covering on natural nails (fingers and toes) ?If you have artificial nails or gel coating that need to be removed by a nail salon, please have this  removed prior to surgery. Artificial nails or gel coating may interfere with anesthesia's ability to adequately monitor your vital signs. ? ?[facility] is not responsible for any belongings or valuables. .  ? ?Do NOT Smoke (Tobacco/Vaping)  24 hours prior to your procedure ? ?If you use a CPAP at night, you may bring your mask for your overnight stay. ?  ?Contacts, glasses, hearing aids, dentures or partials may not be worn into surgery, please bring cases for these belongings ?  ?For patients admitted to the hospital, discharge time will be determined by your treatment team. ?  ?Patients discharged the day of surgery will not be allowed to drive home, and someone needs to stay with them for 24 hours. ? ? ?SURGICAL WAITING ROOM VISITATION ?Patients having surgery or a procedure in a hospital may have two support people. ?Children under the age of 73 must have an adult with them who is not the patient. ?They may stay in the waiting area during the procedure and may switch out with other visitors. If the patient needs to stay at the hospital during part of their recovery, the visitor guidelines for inpatient rooms apply. ? ?Please refer to the [facility] website for the visitor guidelines for Inpatients (after your surgery is over and you are in a regular room).  ? ? ? ? ? ?Special instructions:   ? ?Oral Hygiene is also important to reduce your risk of infection.  Remember - BRUSH YOUR TEETH THE MORNING OF SURGERY WITH YOUR REGULAR TOOTHPASTE ? ? ?[facility]- Preparing For Surgery ? ?Before surgery, you can play an important role. Because skin is not sterile, your skin needs to be as free of germs as possible. You can reduce the number of germs on your skin by washing with CHG (chlorahexidine gluconate) Soap before surgery.  CHG is an antiseptic cleaner which kills germs and bonds with the skin to continue killing germs even after washing.   ? ? ?Please do not use if you have an allergy to CHG or antibacterial  soaps. If your skin becomes reddened/irritated stop using the CHG.  ?Do not shave (including legs and underarms) for at least 48 hours prior to first CHG shower. It is OK to shave your face. ? ?Please follow these instructions carefully. ?  ? ? Shower the NIGHT BEFORE SURGERY and the MORNING OF SURGERY with CHG Soap.  ? If you chose to wash your hair, wash your hair first as usual with your normal shampoo. After you shampoo, rinse your hair and body thoroughly to remove the shampoo.  Then [employer] and genitals (private parts) with your normal soap and rinse thoroughly to remove soap. ? ?After that Use CHG Soap as you would any other liquid soap. You can apply CHG directly to the skin and wash gently with a scrungie or a clean washcloth.  ? ?Apply the CHG Soap to your body ONLY FROM THE NECK DOWN.  Do not use on open wounds or open sores. Avoid contact with your eyes, ears, mouth and genitals (private parts). Wash Face and genitals (private parts)  with your normal soap.  ? ?Wash thoroughly, paying special attention to the area where your surgery will be performed. ? ?Thoroughly rinse your body with warm water from the neck down. ? ?DO NOT shower/wash with your normal soap after using and rinsing off the CHG Soap. ? ?Pat yourself dry with a CLEAN TOWEL. ? ?Wear CLEAN PAJAMAS to bed the night before surgery ? ?Place CLEAN SHEETS on your bed the night before your surgery ? ?DO NOT SLEEP WITH PETS. ? ? ?Day of Surgery: ? ?Take a shower with CHG soap. ?Wear Clean/Comfortable clothing the morning of surgery ?Do not apply any deodorants/lotions.   ?Remember to brush your teeth WITH YOUR REGULAR TOOTHPASTE. ? ? ? ?If you received a COVID test during your pre-op visit, it is requested that you wear a mask when out in public, stay away from anyone that may not be feeling well, and notify your surgeon if you develop symptoms. If you have been in contact with anyone that has tested positive in the last 10 days, please notify  your surgeon. ? ?  ?Please read over the following fact sheets that you were given.  ? ?

## 2021-09-09 ENCOUNTER — Telehealth: Payer: Self-pay | Admitting: *Deleted

## 2021-09-09 MED ORDER — TRANEXAMIC ACID 1000 MG/10ML IV SOLN
2000.0000 mg | INTRAVENOUS | Status: DC
  Filled 2021-09-09 (×2): qty 20

## 2021-09-09 NOTE — Care Plan (Signed)
[facility] RNCM call to patient prior to her upcoming Right total hip replacement on 09/12/21 with Dr. Erlinda Hong. She is an Ortho bundle patient through [facility] and is agreeable to case management. She lives with her husband, who will be assisting after surgery. She has a RW and 3in1/BSC and shower seat. No DME needed. Anticipate HHPT will be needed after a short hospital stay. Referral made to [facility]: Alyssa Robinson after choice provided. Reviewed all post op care instructions. Will continue to follow for needs.

## 2021-09-09 NOTE — Telephone Encounter (Signed)
Ortho bundle Pre-op call completed. 

## 2021-09-11 DIAGNOSIS — M1611 Unilateral primary osteoarthritis, right hip: Secondary | ICD-10-CM

## 2021-09-12 ENCOUNTER — Encounter (HOSPITAL_COMMUNITY)
Admission: RE | Disposition: A | Discharge status: Home Health | Payer: Self-pay | Source: Home / Self Care | Attending: Orthopaedic Surgery

## 2021-09-12 ENCOUNTER — Ambulatory Visit (HOSPITAL_COMMUNITY): Payer: Medicare Other

## 2021-09-12 ENCOUNTER — Observation Stay (HOSPITAL_COMMUNITY): Payer: Medicare Other

## 2021-09-12 ENCOUNTER — Other Ambulatory Visit: Payer: Self-pay

## 2021-09-12 ENCOUNTER — Encounter (HOSPITAL_COMMUNITY): Payer: Self-pay | Admitting: Orthopaedic Surgery

## 2021-09-12 ENCOUNTER — Observation Stay (HOSPITAL_COMMUNITY)
Admission: RE | Admit: 2021-09-12 | Discharge: 2021-09-13 | Disposition: A | Discharge status: Home Health | Payer: Medicare Other | Attending: Orthopaedic Surgery | Admitting: Orthopaedic Surgery

## 2021-09-12 ENCOUNTER — Ambulatory Visit (HOSPITAL_BASED_OUTPATIENT_CLINIC_OR_DEPARTMENT_OTHER): Payer: Medicare Other | Admitting: Certified Registered Nurse Anesthetist

## 2021-09-12 ENCOUNTER — Ambulatory Visit (HOSPITAL_COMMUNITY): Payer: Medicare Other | Admitting: Certified Registered Nurse Anesthetist

## 2021-09-12 DIAGNOSIS — M1611 Unilateral primary osteoarthritis, right hip: Principal | ICD-10-CM

## 2021-09-12 DIAGNOSIS — Z7982 Long term (current) use of aspirin: Secondary | ICD-10-CM | POA: Diagnosis not present

## 2021-09-12 DIAGNOSIS — I10 Essential (primary) hypertension: Secondary | ICD-10-CM | POA: Insufficient documentation

## 2021-09-12 DIAGNOSIS — Z471 Aftercare following joint replacement surgery: Secondary | ICD-10-CM | POA: Diagnosis not present

## 2021-09-12 DIAGNOSIS — Z96641 Presence of right artificial hip joint: Secondary | ICD-10-CM | POA: Diagnosis not present

## 2021-09-12 HISTORY — PX: TOTAL HIP ARTHROPLASTY: SHX124

## 2021-09-12 SURGERY — ARTHROPLASTY, HIP, TOTAL, ANTERIOR APPROACH
Anesthesia: Monitor Anesthesia Care | Site: Hip | Laterality: Right

## 2021-09-12 MED ORDER — METOCLOPRAMIDE HCL 5 MG PO TABS: 5.0000 mg | ORAL_TABLET | Freq: Three times a day (TID) | ORAL | Status: DC | PRN

## 2021-09-12 MED ORDER — AMISULPRIDE (ANTIEMETIC) 5 MG/2ML IV SOLN: 10.0000 mg | Freq: Once | INTRAVENOUS | Status: DC | PRN

## 2021-09-12 MED ORDER — FENTANYL CITRATE (PF) 250 MCG/5ML IJ SOLN
INTRAMUSCULAR | Status: DC | PRN
  Administered 2021-09-12: 25 ug via INTRAVENOUS
  Administered 2021-09-12: 50 ug via INTRAVENOUS

## 2021-09-12 MED ORDER — BUPIVACAINE-MELOXICAM ER 400-12 MG/14ML IJ SOLN
INTRAMUSCULAR | Status: DC | PRN
  Administered 2021-09-12: 400 mg

## 2021-09-12 MED ORDER — ONDANSETRON HCL 4 MG/2ML IJ SOLN
4.0000 mg | Freq: Four times a day (QID) | INTRAMUSCULAR | Status: DC | PRN
  Administered 2021-09-12 – 2021-09-13 (×2): 4 mg via INTRAVENOUS
  Filled 2021-09-12 (×2): qty 2

## 2021-09-12 MED ORDER — TRANEXAMIC ACID-NACL 1000-0.7 MG/100ML-% IV SOLN
1000.0000 mg | INTRAVENOUS | Status: AC
  Administered 2021-09-12: 1000 mg via INTRAVENOUS
  Filled 2021-09-12: qty 100

## 2021-09-12 MED ORDER — VANCOMYCIN HCL 1000 MG IV SOLR
INTRAVENOUS | Status: AC
  Filled 2021-09-12: qty 20

## 2021-09-12 MED ORDER — SODIUM CHLORIDE 0.9 % IV SOLN: INTRAVENOUS | Status: DC

## 2021-09-12 MED ORDER — ACETAMINOPHEN 500 MG PO TABS
1000.0000 mg | ORAL_TABLET | Freq: Four times a day (QID) | ORAL | Status: AC
  Administered 2021-09-12 – 2021-09-13 (×4): 1000 mg via ORAL
  Filled 2021-09-12 (×4): qty 2

## 2021-09-12 MED ORDER — OXYCODONE HCL 5 MG PO TABS: 10.0000 mg | ORAL_TABLET | ORAL | Status: DC | PRN

## 2021-09-12 MED ORDER — OXYCODONE HCL ER 10 MG PO T12A: 10.0000 mg | EXTENDED_RELEASE_TABLET | Freq: Two times a day (BID) | ORAL | Status: DC

## 2021-09-12 MED ORDER — HYDROXYZINE HCL 50 MG/ML IM SOLN
50.0000 mg | Freq: Four times a day (QID) | INTRAMUSCULAR | Status: DC | PRN
Start: 2021-09-12 — End: 2021-09-13

## 2021-09-12 MED ORDER — ACETAMINOPHEN 325 MG PO TABS
325.0000 mg | ORAL_TABLET | Freq: Four times a day (QID) | ORAL | Status: DC | PRN
  Administered 2021-09-13: 650 mg via ORAL
  Filled 2021-09-12: qty 2

## 2021-09-12 MED ORDER — ORAL CARE MOUTH RINSE: 15.0000 mL | Freq: Once | OROMUCOSAL | Status: AC

## 2021-09-12 MED ORDER — SORBITOL 70 % SOLN: 30.0000 mL | Freq: Every day | Status: DC | PRN

## 2021-09-12 MED ORDER — ONDANSETRON HCL 4 MG/2ML IJ SOLN: 4.0000 mg | Freq: Once | INTRAMUSCULAR | Status: DC | PRN

## 2021-09-12 MED ORDER — DIPHENHYDRAMINE HCL 12.5 MG/5ML PO ELIX: 25.0000 mg | ORAL_SOLUTION | ORAL | Status: DC | PRN

## 2021-09-12 MED ORDER — PANTOPRAZOLE SODIUM 40 MG PO TBEC
40.0000 mg | DELAYED_RELEASE_TABLET | Freq: Every day | ORAL | Status: DC
  Administered 2021-09-12 – 2021-09-13 (×2): 40 mg via ORAL
  Filled 2021-09-12 (×2): qty 1

## 2021-09-12 MED ORDER — PROPOFOL 10 MG/ML IV BOLUS
INTRAVENOUS | Status: AC
  Filled 2021-09-12: qty 20

## 2021-09-12 MED ORDER — MENTHOL 3 MG MT LOZG: 1.0000 | LOZENGE | OROMUCOSAL | Status: DC | PRN

## 2021-09-12 MED ORDER — LACTATED RINGERS IV SOLN: INTRAVENOUS | Status: DC

## 2021-09-12 MED ORDER — MIDAZOLAM HCL 2 MG/2ML IJ SOLN
INTRAMUSCULAR | Status: AC
  Filled 2021-09-12: qty 2

## 2021-09-12 MED ORDER — TRANEXAMIC ACID-NACL 1000-0.7 MG/100ML-% IV SOLN
1000.0000 mg | Freq: Once | INTRAVENOUS | Status: AC
  Administered 2021-09-12: 1000 mg via INTRAVENOUS
  Filled 2021-09-12: qty 100

## 2021-09-12 MED ORDER — METOCLOPRAMIDE HCL 5 MG/ML IJ SOLN: 5.0000 mg | Freq: Three times a day (TID) | INTRAMUSCULAR | Status: DC | PRN

## 2021-09-12 MED ORDER — OXYCODONE HCL 5 MG PO TABS
5.0000 mg | ORAL_TABLET | ORAL | Status: DC | PRN
  Administered 2021-09-12 – 2021-09-13 (×3): 5 mg via ORAL
  Filled 2021-09-12 (×3): qty 1

## 2021-09-12 MED ORDER — ALUM & MAG HYDROXIDE-SIMETH 200-200-20 MG/5ML PO SUSP: 30.0000 mL | ORAL | Status: DC | PRN

## 2021-09-12 MED ORDER — DEXAMETHASONE SODIUM PHOSPHATE 10 MG/ML IJ SOLN
10.0000 mg | Freq: Once | INTRAMUSCULAR | Status: AC
  Administered 2021-09-13: 10 mg via INTRAVENOUS
  Filled 2021-09-12: qty 1

## 2021-09-12 MED ORDER — CEFAZOLIN SODIUM-DEXTROSE 2-4 GM/100ML-% IV SOLN
2.0000 g | Freq: Four times a day (QID) | INTRAVENOUS | Status: AC
  Administered 2021-09-12 (×2): 2 g via INTRAVENOUS
  Filled 2021-09-12 (×2): qty 100

## 2021-09-12 MED ORDER — CHLORHEXIDINE GLUCONATE 0.12 % MT SOLN
15.0000 mL | Freq: Once | OROMUCOSAL | Status: AC
  Administered 2021-09-12: 15 mL via OROMUCOSAL
  Filled 2021-09-12: qty 15

## 2021-09-12 MED ORDER — POLYETHYLENE GLYCOL 3350 17 G PO PACK: 17.0000 g | PACK | Freq: Every day | ORAL | Status: DC

## 2021-09-12 MED ORDER — TRANEXAMIC ACID 1000 MG/10ML IV SOLN
INTRAVENOUS | Status: DC | PRN
  Administered 2021-09-12: 2000 mg via TOPICAL

## 2021-09-12 MED ORDER — ACETAMINOPHEN 500 MG PO TABS
1000.0000 mg | ORAL_TABLET | Freq: Once | ORAL | Status: AC
  Administered 2021-09-12: 1000 mg via ORAL
  Filled 2021-09-12: qty 2

## 2021-09-12 MED ORDER — HYDROMORPHONE HCL 1 MG/ML IJ SOLN: 0.5000 mg | INTRAMUSCULAR | Status: DC | PRN

## 2021-09-12 MED ORDER — FERROUS SULFATE 325 (65 FE) MG PO TABS
325.0000 mg | ORAL_TABLET | Freq: Three times a day (TID) | ORAL | Status: DC
  Administered 2021-09-12 – 2021-09-13 (×3): 325 mg via ORAL
  Filled 2021-09-12 (×3): qty 1

## 2021-09-12 MED ORDER — METHOCARBAMOL 500 MG PO TABS
500.0000 mg | ORAL_TABLET | Freq: Four times a day (QID) | ORAL | Status: DC | PRN
  Administered 2021-09-13: 500 mg via ORAL
  Filled 2021-09-12: qty 1

## 2021-09-12 MED ORDER — 0.9 % SODIUM CHLORIDE (POUR BTL) OPTIME
TOPICAL | Status: DC | PRN
  Administered 2021-09-12: 1000 mL

## 2021-09-12 MED ORDER — BUPIVACAINE-MELOXICAM ER 400-12 MG/14ML IJ SOLN
INTRAMUSCULAR | Status: AC
  Filled 2021-09-12: qty 1

## 2021-09-12 MED ORDER — MIDAZOLAM HCL 2 MG/2ML IJ SOLN
INTRAMUSCULAR | Status: DC | PRN
  Administered 2021-09-12 (×2): 1 mg via INTRAVENOUS

## 2021-09-12 MED ORDER — PROPOFOL 500 MG/50ML IV EMUL
INTRAVENOUS | Status: DC | PRN
  Administered 2021-09-12: 75 ug/kg/min via INTRAVENOUS

## 2021-09-12 MED ORDER — CEFAZOLIN SODIUM-DEXTROSE 2-4 GM/100ML-% IV SOLN
2.0000 g | INTRAVENOUS | Status: AC
  Administered 2021-09-12: 2 g via INTRAVENOUS
  Filled 2021-09-12: qty 100

## 2021-09-12 MED ORDER — ASPIRIN 81 MG PO CHEW
81.0000 mg | CHEWABLE_TABLET | Freq: Two times a day (BID) | ORAL | Status: DC
  Administered 2021-09-12 – 2021-09-13 (×2): 81 mg via ORAL
  Filled 2021-09-12 (×2): qty 1

## 2021-09-12 MED ORDER — PHENOL 1.4 % MT LIQD: 1.0000 | OROMUCOSAL | Status: DC | PRN

## 2021-09-12 MED ORDER — POVIDONE-IODINE 10 % EX SWAB
2.0000 "application " | Freq: Once | CUTANEOUS | Status: AC
  Administered 2021-09-12: 2 via TOPICAL

## 2021-09-12 MED ORDER — SODIUM CHLORIDE 0.9 % IR SOLN
Status: DC | PRN
  Administered 2021-09-12: 1000 mL

## 2021-09-12 MED ORDER — FENTANYL CITRATE (PF) 250 MCG/5ML IJ SOLN
INTRAMUSCULAR | Status: AC
  Filled 2021-09-12: qty 5

## 2021-09-12 MED ORDER — ONDANSETRON HCL 4 MG PO TABS: 4.0000 mg | ORAL_TABLET | Freq: Four times a day (QID) | ORAL | Status: DC | PRN

## 2021-09-12 MED ORDER — DOCUSATE SODIUM 100 MG PO CAPS
100.0000 mg | ORAL_CAPSULE | Freq: Two times a day (BID) | ORAL | Status: DC
  Administered 2021-09-12 – 2021-09-13 (×2): 100 mg via ORAL
  Filled 2021-09-12 (×2): qty 1

## 2021-09-12 MED ORDER — ONDANSETRON HCL 4 MG/2ML IJ SOLN
INTRAMUSCULAR | Status: DC | PRN
  Administered 2021-09-12: 4 mg via INTRAVENOUS

## 2021-09-12 MED ORDER — PHENYLEPHRINE HCL-NACL 20-0.9 MG/250ML-% IV SOLN
INTRAVENOUS | Status: DC | PRN
  Administered 2021-09-12: 25 ug/min via INTRAVENOUS

## 2021-09-12 MED ORDER — HYDROMORPHONE HCL 1 MG/ML IJ SOLN
0.2500 mg | INTRAMUSCULAR | Status: DC | PRN
  Administered 2021-09-12: 0.25 mg via INTRAVENOUS

## 2021-09-12 MED ORDER — OXYCODONE HCL 5 MG PO TABS: 5.0000 mg | ORAL_TABLET | Freq: Once | ORAL | Status: DC | PRN

## 2021-09-12 MED ORDER — OXYCODONE HCL 5 MG/5ML PO SOLN: 5.0000 mg | Freq: Once | ORAL | Status: DC | PRN

## 2021-09-12 MED ORDER — BUPIVACAINE IN DEXTROSE 0.75-8.25 % IT SOLN
INTRATHECAL | Status: DC | PRN
  Administered 2021-09-12: 1.6 mL via INTRATHECAL

## 2021-09-12 MED ORDER — HYDROMORPHONE HCL 1 MG/ML IJ SOLN
INTRAMUSCULAR | Status: AC
  Filled 2021-09-12: qty 1

## 2021-09-12 MED ORDER — METHOCARBAMOL 1000 MG/10ML IJ SOLN: 500.0000 mg | Freq: Four times a day (QID) | INTRAVENOUS | Status: DC | PRN

## 2021-09-12 MED ORDER — VANCOMYCIN HCL 1 G IV SOLR
INTRAVENOUS | Status: DC | PRN
  Administered 2021-09-12: 1000 mg via TOPICAL

## 2021-09-12 SURGICAL SUPPLY — 66 items
BAG COUNTER SPONGE SURGICOUNT (BAG) ×2 IMPLANT
BAG DECANTER FOR FLEXI CONT (MISCELLANEOUS) ×2 IMPLANT
BALL HIP CERAMIC (Hips) IMPLANT
CATH FOLEY 2WAY SLVR  5CC 14FR (CATHETERS) ×2
CATH FOLEY 2WAY SLVR 5CC 14FR (CATHETERS) IMPLANT
COVER PERINEAL POST (MISCELLANEOUS) ×2 IMPLANT
COVER SURGICAL LIGHT HANDLE (MISCELLANEOUS) ×2 IMPLANT
CUP ACET PINNACLE SECTR 48MM (Joint) IMPLANT
DERMABOND ADVANCED (GAUZE/BANDAGES/DRESSINGS) ×1
DERMABOND ADVANCED .7 DNX12 (GAUZE/BANDAGES/DRESSINGS) IMPLANT
DRAPE C-ARM 42X72 X-RAY (DRAPES) ×2 IMPLANT
DRAPE POUCH INSTRU U-SHP 10X18 (DRAPES) ×2 IMPLANT
DRAPE STERI IOBAN 125X83 (DRAPES) ×2 IMPLANT
DRAPE U-SHAPE 47X51 STRL (DRAPES) ×4 IMPLANT
DRESSING AQUACEL AG SP 3.5X10 (GAUZE/BANDAGES/DRESSINGS) IMPLANT
DRSG AQUACEL AG ADV 3.5X10 (GAUZE/BANDAGES/DRESSINGS) ×2 IMPLANT
DRSG AQUACEL AG SP 3.5X10 (GAUZE/BANDAGES/DRESSINGS) ×2
DURAPREP 26ML APPLICATOR (WOUND CARE) ×4 IMPLANT
ELECT BLADE 4.0 EZ CLEAN MEGAD (MISCELLANEOUS) ×2
ELECT REM PT RETURN 9FT ADLT (ELECTROSURGICAL) ×2
ELECTRODE BLDE 4.0 EZ CLN MEGD (MISCELLANEOUS) ×1 IMPLANT
ELECTRODE REM PT RTRN 9FT ADLT (ELECTROSURGICAL) ×1 IMPLANT
GLOVE BIOGEL PI IND STRL 7.0 (GLOVE) ×1 IMPLANT
GLOVE BIOGEL PI IND STRL 7.5 (GLOVE) ×4 IMPLANT
GLOVE BIOGEL PI INDICATOR 7.0 (GLOVE) ×1
GLOVE BIOGEL PI INDICATOR 7.5 (GLOVE) ×4
GLOVE ECLIPSE 7.0 STRL STRAW (GLOVE) ×4 IMPLANT
GLOVE SKINSENSE NS SZ7.5 (GLOVE) ×1
GLOVE SKINSENSE STRL SZ7.5 (GLOVE) ×1 IMPLANT
GLOVE SURG UNDER POLY LF SZ7 (GLOVE) ×2 IMPLANT
GLOVE SURG UNDER POLY LF SZ7.5 (GLOVE) ×4 IMPLANT
GOWN STRL REIN XL XLG (GOWN DISPOSABLE) ×2 IMPLANT
GOWN STRL REUS W/ TWL LRG LVL3 (GOWN DISPOSABLE) IMPLANT
GOWN STRL REUS W/ TWL XL LVL3 (GOWN DISPOSABLE) ×1 IMPLANT
GOWN STRL REUS W/TWL LRG LVL3 (GOWN DISPOSABLE) ×4
GOWN STRL REUS W/TWL XL LVL3 (GOWN DISPOSABLE) ×2
HANDPIECE INTERPULSE COAX TIP (DISPOSABLE) ×2
HIP BALL CERAMIC (Hips) ×2 IMPLANT
HOOD PEEL AWAY FLYTE STAYCOOL (MISCELLANEOUS) ×5 IMPLANT
IV NS IRRIG 3000ML ARTHROMATIC (IV SOLUTION) ×2 IMPLANT
KIT BASIN OR (CUSTOM PROCEDURE TRAY) ×2 IMPLANT
MARKER SKIN DUAL TIP RULER LAB (MISCELLANEOUS) ×2 IMPLANT
NDL SPNL 18GX3.5 QUINCKE PK (NEEDLE) ×1 IMPLANT
NEEDLE SPNL 18GX3.5 QUINCKE PK (NEEDLE) ×2 IMPLANT
PACK TOTAL JOINT (CUSTOM PROCEDURE TRAY) ×2 IMPLANT
PACK UNIVERSAL I (CUSTOM PROCEDURE TRAY) ×2 IMPLANT
PINN ALTRX NEUT ID X OD 32X48 ×1 IMPLANT
PINNSECTOR W/GRIP ACE CUP 48MM (Joint) ×2 IMPLANT
SAW OSC TIP CART 19.5X105X1.3 (SAW) ×2 IMPLANT
SCREW 6.5MMX30MM (Screw) ×1 IMPLANT
SET HNDPC FAN SPRY TIP SCT (DISPOSABLE) ×1 IMPLANT
SOLUTION PRONTOSAN WOUND 350ML (IRRIGATION / IRRIGATOR) ×1 IMPLANT
STAPLER VISISTAT 35W (STAPLE) IMPLANT
STEM FEM ACTIS STD SZ2 (Stem) ×1 IMPLANT
SUT ETHIBOND 2 V 37 (SUTURE) ×2 IMPLANT
SUT VIC AB 0 CT1 27 (SUTURE) ×2
SUT VIC AB 0 CT1 27XBRD ANBCTR (SUTURE) ×1 IMPLANT
SUT VIC AB 1 CTX 36 (SUTURE) ×2
SUT VIC AB 1 CTX36XBRD ANBCTR (SUTURE) ×1 IMPLANT
SUT VIC AB 2-0 CT1 27 (SUTURE) ×4
SUT VIC AB 2-0 CT1 TAPERPNT 27 (SUTURE) ×2 IMPLANT
SYR 50ML LL SCALE MARK (SYRINGE) ×2 IMPLANT
TOWEL GREEN STERILE (TOWEL DISPOSABLE) ×2 IMPLANT
TRAY FOLEY W/BAG SLVR 16FR (SET/KITS/TRAYS/PACK) ×2
TRAY FOLEY W/BAG SLVR 16FR ST (SET/KITS/TRAYS/PACK) ×1 IMPLANT
YANKAUER SUCT BULB TIP NO VENT (SUCTIONS) ×2 IMPLANT

## 2021-09-12 NOTE — Op Note (Signed)
RIGHT TOTAL HIP ARTHROPLASTY ANTERIOR APPROACH  Procedure Note Alyssa Robinson   [id number]  Pre-op Diagnosis: right hip degenerative joint disease     Post-op Diagnosis: same   Operative Procedures  1. Total hip replacement; Right hip; uncemented [id number]   Surgeon: Frankey Shown, M.D.  Assist: Madalyn Rob, PA-C   Anesthesia: spinal  Prosthesis: Depuy Acetabulum: Pinnacle 48 mm Femur: Actis 2 STD Head: 32 size: +5 Liner: +4 Bearing Type: ceramic/poly  Total Hip Arthroplasty (Anterior Approach) Op Note:  After informed consent was obtained and the operative extremity marked in the holding area, the patient was brought back to the operating room and placed supine on the HANA table. Next, the operative extremity was prepped and draped in normal sterile fashion. Surgical timeout occurred verifying patient identification, surgical site, surgical procedure and administration of antibiotics.  A modified anterior Smith-Peterson approach to the hip was performed, using the interval between tensor fascia lata and sartorius.  Dissection was carried bluntly down onto the anterior hip capsule. The lateral femoral circumflex vessels were identified and coagulated. A capsulotomy was performed and the capsular flaps tagged for later repair.  The neck osteotomy was performed. The femoral head was removed which showed severe wear, the acetabular rim was cleared of soft tissue and osteophytes and attention was turned to reaming the acetabulum.  Sequential reaming was performed under fluoroscopic guidance. We reamed to a size 47 mm, and then impacted the acetabular shell. A 30 mm cancellous screw was placed through the shell for added fixation.  The liner was then placed after irrigation and attention turned to the femur.  After placing the femoral hook, the leg was taken to externally rotated, extended and adducted position taking care to perform soft tissue releases to allow for adequate  mobilization of the femur. Soft tissue was cleared from the shoulder of the greater trochanter and the hook elevator used to improve exposure of the proximal femur. Sequential broaching performed up to a size 2. Trial neck and head were placed. The leg was brought back up to neutral and the construct reduced.  Antibiotic irrigation was placed in the surgical wound.  The position and sizing of components, offset and leg lengths were checked using fluoroscopy. Stability of the construct was checked in extension and external rotation without any subluxation or impingement of prosthesis. We dislocated the prosthesis, dropped the leg back into position, removed trial components, and irrigated copiously. The final stem and head was then placed, the leg brought back up, the system reduced and fluoroscopy used to verify positioning.  We irrigated, obtained hemostasis and closed the capsule using #2 ethibond suture.  One gram of vancomycin powder was placed in the surgical bed.   One gram of topical tranexamic acid was injected into the joint.  The fascia was closed with #1 vicryl plus, the deep fat layer was closed with 0 vicryl, the subcutaneous layers closed with 2.0 Vicryl Plus and the skin closed with 2.0 nylon and dermabond. A sterile dressing was applied. The patient was awakened in the operating room and taken to recovery in stable condition.  All sponge, needle, and instrument counts were correct at the end of the case.   Tawanna Cooler, my PA, was a medical necessity for opening, closing, limb positioning, retracting, exposing, and overall facilitation and timely completion of the surgery.  Position: supine  Complications: see description of procedure.  Time Out: performed   Drains/Packing: none  Estimated blood loss: see anesthesia record  Returned  to Recovery Room: in good condition.   Antibiotics: yes   Mechanical VTE (DVT) Prophylaxis: sequential compression devices, TED thigh-high   Chemical VTE (DVT) Prophylaxis: aspirin   Fluid Replacement: see anesthesia record  Specimens Removed: 1 to pathology   Sponge and Instrument Count Correct? yes   PACU: portable radiograph - low AP   Plan/RTC: Return in 2 weeks for staple removal. Weight Bearing/Load Lower Extremity: full  Hip precautions: none Suture Removal: 2 weeks   N. Eduard Roux, MD [facility] 10:36 AM   Implant Name Type Inv. Item Serial No. Manufacturer Lot No. LRB No. Used Action  PINNSECTOR W/GRIP ACE CUP 48MM - [id number] Joint PINNSECTOR W/GRIP ACE CUP 48MM  [employer] [id number] Right 1 Implanted  SCREW 6.5MMX30MM - [id number] Screw SCREW 6.5MMX30MM  [employer] [id number] Right 1 Implanted  PINN ALTRX NEUT ID X OD 32X48 - [id number]  PINN ALTRX NEUT ID X OD 32X48  [employer] [id number] Right 1 Implanted  STEM FEM ACTIS STD SZ2 - [id number] Stem STEM FEM ACTIS STD SZ2  [employer] [id number] Right 1 Implanted  HIP BALL CERAMIC - [id number] Hips HIP BALL CERAMIC  [employer] [id number] Right 1 Implanted

## 2021-09-12 NOTE — Transfer of Care (Signed)
Immediate Anesthesia Transfer of Care Note  Patient: Alyssa Robinson  Procedure(s) Performed: RIGHT TOTAL HIP ARTHROPLASTY ANTERIOR APPROACH (Right: Hip)  Patient Location: PACU  Anesthesia Type:MAC and Spinal  Level of Consciousness: awake, alert  and oriented  Airway & Oxygen Therapy: Patient Spontanous Breathing  Post-op Assessment: Report given to RN and Post -op Vital signs reviewed and stable  Post vital signs: Reviewed and stable  Last Vitals:  Vitals Value Taken Time  BP 149/89 09/12/21 1108  Temp    Pulse    Resp 19 09/12/21 1110  SpO2 99   Vitals shown include unvalidated device data.  Last Pain:  Vitals:   09/12/21 0705  TempSrc:   PainSc: 0-No pain         Complications: No notable events documented.

## 2021-09-12 NOTE — Evaluation (Signed)
Physical Therapy Evaluation Patient Details Name: Alyssa Robinson MRN: [id number] DOB: Sep 02, 1950 Today's Date: 09/12/2021  History of Present Illness  Pt is a 71 y/o female s/p R THA. PMH includes HTN.  Clinical Impression  Pt is s/p surgery above with deficits below. Pt requiring min guard A to stand and transfer to chair this session. Further mobility limited secondary to nausea/vomiting. Reports husband can assist as needed. Anticipate pt will progress well once symptoms improve. Will continue to follow acutely.      Recommendations for follow up therapy are one component of a multi-disciplinary discharge planning process, led by the attending physician.  Recommendations may be updated based on patient status, additional functional criteria and insurance authorization.  Follow Up Recommendations Follow physician's recommendations for discharge plan and follow up therapies    Assistance Recommended at Discharge Intermittent Supervision/Assistance  Patient can return home with the following  Assistance with cooking/housework;Help with stairs or ramp for entrance;Assist for transportation    Equipment Recommendations None recommended by PT  Recommendations for Other Services       Functional Status Assessment Patient has had a recent decline in their functional status and demonstrates the ability to make significant improvements in function in a reasonable and predictable amount of time.     Precautions / Restrictions Precautions Precautions: Fall Restrictions Weight Bearing Restrictions: Yes RLE Weight Bearing: Weight bearing as tolerated      Mobility  Bed Mobility Overal bed mobility: Needs Assistance Bed Mobility: Supine to Sit     Supine to sit: Min assist     General bed mobility comments: Min A for RLE assist. Increased time required    Transfers Overall transfer level: Needs assistance Equipment used: Rolling walker (2 wheels) Transfers: Sit to/from Stand,  Bed to chair/wheelchair/BSC Sit to Stand: Min guard Stand pivot transfers: Min guard         General transfer comment: Min guard for safety to stand and transfer to chair. Cues for sequencing. Pt with increased nausea/vomiting following transfer so further mobility deferred.    Ambulation/Gait                  Stairs            Wheelchair Mobility    Modified Rankin (Stroke Patients Only)       Balance Overall balance assessment: Needs assistance Sitting-balance support: No upper extremity supported, Feet supported Sitting balance-Leahy Scale: Fair     Standing balance support: Bilateral upper extremity supported Standing balance-Leahy Scale: Poor Standing balance comment: Reliant on BUE support                             Pertinent Vitals/Pain Pain Assessment Pain Assessment: Faces Faces Pain Scale: Hurts little more Pain Location: R hip Pain Descriptors / Indicators: Grimacing, Guarding Pain Intervention(s): Limited activity within patient's tolerance, Monitored during session, Repositioned    Home Living Family/patient expects to be discharged to:: Private residence Living Arrangements: Spouse/significant other Available Help at Discharge: Family;Available 24 hours/day Type of Home: Apartment Home Access: Level entry       Home Layout: One level Home Equipment: Grab bars - tub/shower;Grab bars - toilet;Shower seat;Rolling Environmental consultant (2 wheels) (has youth RW)      Prior Function Prior Level of Function : Independent/Modified Independent                     Hand Dominance  Extremity/Trunk Assessment   Upper Extremity Assessment Upper Extremity Assessment: Overall WFL for tasks assessed    Lower Extremity Assessment Lower Extremity Assessment: RLE deficits/detail RLE Deficits / Details: Deficits consistent with post op pain and weakness.       Communication   Communication: No difficulties  Cognition  Arousal/Alertness: Awake/alert Behavior During Therapy: WFL for tasks assessed/performed Overall Cognitive Status: Within Functional Limits for tasks assessed                                          General Comments      Exercises     Assessment/Plan    PT Assessment Patient needs continued PT services  PT Problem List Decreased strength;Decreased activity tolerance;Decreased range of motion;Decreased mobility;Decreased balance;Decreased knowledge of use of DME;Pain       PT Treatment Interventions DME instruction;Gait training;Stair training;Functional mobility training;Therapeutic activities;Therapeutic exercise;Balance training;Patient/family education    PT Goals (Current goals can be found in the Care Plan section)  Acute Rehab PT Goals Patient Stated Goal: to go home PT Goal Formulation: With patient/family Time For Goal Achievement: 09/26/21 Potential to Achieve Goals: Good    Frequency 7X/week     Co-evaluation               AM-PAC PT "6 Clicks" Mobility  Outcome Measure Help needed turning from your back to your side while in a flat bed without using bedrails?: A Little Help needed moving from lying on your back to sitting on the side of a flat bed without using bedrails?: A Little Help needed moving to and from a bed to a chair (including a wheelchair)?: A Little Help needed standing up from a chair using your arms (e.g., wheelchair or bedside chair)?: A Little Help needed to walk in hospital room?: A Little Help needed climbing 3-5 steps with a railing? : A Lot 6 Click Score: 17    End of Session Equipment Utilized During Treatment: Gait belt Activity Tolerance: Treatment limited secondary to medical complications (Comment) (nausea/vomiting) Patient left: in chair;with call bell/phone within reach;with family/visitor present Nurse Communication: Mobility status PT Visit Diagnosis: Other abnormalities of gait and mobility  (R26.89);Pain Pain - Right/Left: Right Pain - part of body: Hip    Time: 1962-2297 PT Time Calculation (min) (ACUTE ONLY): 20 min   Charges:   PT Evaluation $PT Eval Low Complexity: 1 Low          [name], DPT  Acute Rehabilitation Services  Pager: [phone] Office: [phone]   Rudean Hitt 09/12/2021, 4:07 PM

## 2021-09-12 NOTE — Anesthesia Postprocedure Evaluation (Signed)
Anesthesia Post Note  Patient: Alyssa Robinson  Procedure(s) Performed: RIGHT TOTAL HIP ARTHROPLASTY ANTERIOR APPROACH (Right: Hip)     Patient location during evaluation: PACU Anesthesia Type: MAC and Spinal Level of consciousness: awake and alert and oriented Pain management: pain level controlled Vital Signs Assessment: post-procedure vital signs reviewed and stable Respiratory status: spontaneous breathing, nonlabored ventilation and respiratory function stable Cardiovascular status: blood pressure returned to baseline and stable Postop Assessment: no headache, no backache, spinal receding and no apparent nausea or vomiting Anesthetic complications: no   No notable events documented.  Last Vitals:  Vitals:   09/12/21 1140 09/12/21 1155  BP: (!) 149/77 (!) 146/84  Pulse: (!) 54 (!) 49  Resp: 11 12  Temp:    SpO2: 100% 100%    Last Pain:  Vitals:   09/12/21 1140  TempSrc:   PainSc: 0-No pain                 Pervis Hocking

## 2021-09-12 NOTE — TOC Transition Note (Signed)
Transition of Care [facility]) - CM/SW Discharge Note   Patient Details  Name: Alyssa Robinson MRN: [id number] Date of Birth: 12-28-1950  [facility]) CM/SW Contact:  Geralynn Ochs, LCSW Phone Number: 09/12/2021, 1:18 PM   Clinical Narrative:   CSW noting patient established with [facility] for home health with ortho bundle and DME provided already. No TOC needs identified at this time.    Final next level of care: [place] Barriers to Discharge: Barriers Resolved   Patient Goals and CMS Choice        Discharge Placement                       Discharge Plan and Services                                     Social Determinants of Health (SDOH) Interventions     Readmission Risk Interventions     View : No data to display.

## 2021-09-12 NOTE — H&P (Signed)
PREOPERATIVE H&P  Chief Complaint: right hip degenerative joint disease  HPI: Alyssa Robinson is a 71 y.o. female who presents for surgical treatment of right hip degenerative joint disease.  She denies any changes in medical history.  Past Medical History:  Diagnosis Date   Anemia    hx   Bronchitis    Bronchitis    hx   Closed patellar sleeve fracture of left knee    Diverticulosis    GERD (gastroesophageal reflux disease)    occ   Headache(784.0)    migraines occ   Hypertension    no meds in over 72yr   Pneumonia    Seasonal allergies    Past Surgical History:  Procedure Laterality Date   ABDOMINAL HYSTERECTOMY     71y/o   APPENDECTOMY  03/17/2013   Procedure: INCIDENTAL APPENDECTOMY;  Surgeon: JGwenyth Ober MD;  [place] [facility]  Service: General;;   capsule endoscopy     COLON SURGERY     COLONOSCOPY     in 2012 -    COLOSTOMY CLOSURE  03/17/2013   COLOSTOMY REVERSAL  03/17/2013   Procedure: COLOSTOMY REVERSAL;  Surgeon: JGwenyth Ober MD;  [place] [facility]  Service: General;;   EUS N/A 04/30/2015   Procedure: UPPER ENDOSCOPIC ULTRASOUND (EUS) LINEAR;  Surgeon: PCarol Ada MD;  [facility];  Service: Endoscopy;  Laterality: N/A;   HERNIA REPAIR     X2 2-4 y/o inguinal   LAPAROTOMY N/A 10/17/2012   Procedure: EXPLORATORY LAPAROTOMY,  COLON RESECTION AND COLOSTOMY;  Surgeon: JGwenyth Ober MD;  [place] [facility]  Service: General;  Laterality: N/A;   LYSIS OF ADHESION  03/17/2013   Procedure: LYSIS OF ADHESION;  Surgeon: JGwenyth Ober MD;  [place] [facility]  Service: General;;   ORIF PATELLA Left 02/20/2020   Procedure: OPEN REDUCTION INTERNAL (ORIF) FIXATION LEFT PATELLA;  Surgeon: XLeandrew Koyanagi MD;  Location: [facility]  Service: Orthopedics;  Laterality: Left;   UPPER GASTROINTESTINAL ENDOSCOPY     2013 -   Social History   Socioeconomic History   Marital status: Married    Spouse name: Not on file   Number of children:  Not on file   Years of education: Not on file   Highest education level: Not on file  Occupational History   Not on file  Tobacco Use   Smoking status: Never   Smokeless tobacco: Never   Tobacco comments:    EXPOSED TO SECOND HAND SMOKE   Vaping Use   Vaping Use: Never used  Substance and Sexual Activity   Alcohol use: Not Currently    Alcohol/week: 2.0 standard drinks    Types: 2 Glasses of wine per week   Drug use: No   Sexual activity: Not Currently    Birth control/protection: Post-menopausal  Other Topics Concern   Not on file  Social History Narrative   Not on file   Social Determinants of Health   Financial Resource Strain: Low Risk    Difficulty of Paying Living Expenses: Not hard at all  Food Insecurity: No Food Insecurity   Worried About RCharity fundraiserin the Last Year: Never true   Ran Out of Food in the Last Year: Never true  Transportation Needs: No Transportation Needs   Lack of Transportation (Medical): No   Lack of Transportation (Non-Medical): No  Physical Activity: Sufficiently Active   Days of Exercise per Week: 7 days  Minutes of Exercise per Session: 30 min  Stress: No Stress Concern Present   Feeling of Stress : Not at all  Social Connections: Socially Integrated   Frequency of Communication with Friends and Family: More than three times a week   Frequency of Social Gatherings with Friends and Family: More than three times a week   Attends Religious Services: More than 4 times per year   Active Member of [employer] or Organizations: Yes   Attends Music therapist: More than 4 times per year   Marital Status: Married   Family History  Problem Relation Age of Onset   Esophageal cancer Mother        and ovarian cancer   Alcohol abuse Mother    Arthritis Mother    Heart disease Father    Alcohol abuse Father    Hypertension Father    Diabetes Father    Early death Father    Breast cancer Sister    Colon cancer Other     Pancreatic cancer Other    Stroke Neg Hx    Hyperlipidemia Neg Hx    Kidney disease Neg Hx    COPD Neg Hx    Allergies  Allergen Reactions   Caffeine Palpitations and Nausea And Vomiting    Heart racing with high doses Makes heart race Heart racing Makes heart race Heart racing with high doses Makes heart race Makes heart race    Sodium Lauryl Sulfate Rash    Swollen gums    Prior to Admission medications   Medication Sig Start Date End Date Taking? Authorizing Provider  aspirin EC 81 MG tablet Take 1 tablet (81 mg total) by mouth 2 (two) times daily. To be taken after surgery to prevent blood clots 09/05/21   Aundra Dubin, PA-C  docusate sodium (COLACE) 100 MG capsule Take 1 capsule (100 mg total) by mouth daily as needed. 09/05/21 09/05/22  Aundra Dubin, PA-C  fexofenadine (ALLEGRA) 180 MG tablet Take 180 mg by mouth at bedtime.   Yes [provider]  Lidocaine, Anorectal, (RECTICARE EX) Apply 1 application. topically daily as needed (rectal care).   Yes [provider]  methocarbamol (ROBAXIN) 500 MG tablet Take 1 tablet (500 mg total) by mouth 2 (two) times daily as needed for muscle spasms. To be taken after surgery 09/05/21   Aundra Dubin, PA-C  ondansetron (ZOFRAN) 4 MG tablet Take 1 tablet (4 mg total) by mouth every 8 (eight) hours as needed for nausea or vomiting. 09/05/21   Aundra Dubin, PA-C  oxyCODONE-acetaminophen (PERCOCET) 5-325 MG tablet Take 1-2 tablets by mouth every 6 (six) hours as needed. To be taken after surgery 09/05/21   Aundra Dubin, PA-C  Pediatric Multiple Vit-C-FA (PEDIATRIC MULTIVITAMIN) chewable tablet Chew 1 tablet by mouth daily. [place]   Yes [provider]  Probiotic Product ([place]) 170 MG CAPS Take 1 tablet by mouth in the morning.   Yes [provider]  sodium chloride (OCEAN) 0.65 % SOLN nasal spray Place 1 spray into both nostrils as needed for congestion.   Yes [provider]     Positive ROS: All other systems have been reviewed and were otherwise negative with the exception of those mentioned in the HPI and as above.  Physical Exam: General: Alert, no acute distress Cardiovascular: No pedal edema Respiratory: No cyanosis, no use of accessory musculature GI: abdomen soft Skin: No lesions in the area of chief complaint Neurologic: Sensation  intact distally Psychiatric: Patient is competent for consent with normal mood and affect Lymphatic: no lymphedema  MUSCULOSKELETAL: exam stable  Assessment: right hip degenerative joint disease  Plan: Plan for Procedure(s): RIGHT TOTAL HIP ARTHROPLASTY ANTERIOR APPROACH  The risks benefits and alternatives were discussed with the patient including but not limited to the risks of nonoperative treatment, versus surgical intervention including infection, bleeding, nerve injury,  blood clots, cardiopulmonary complications, morbidity, mortality, among others, and they were willing to proceed.   Preoperative templating of the joint replacement has been completed, documented, and submitted to the Operating Room personnel in order to optimize intra-operative equipment management.   Eduard Roux, MD 09/12/2021 7:04 AM

## 2021-09-12 NOTE — Anesthesia Procedure Notes (Signed)
Spinal  Patient location during procedure: OR Start time: 09/12/2021 9:13 AM End time: 09/12/2021 9:16 AM Reason for block: surgical anesthesia Staffing Performed: anesthesiologist  Anesthesiologist: Pervis Hocking, DO Preanesthetic Checklist Completed: patient identified, IV checked, risks and benefits discussed, surgical consent, monitors and equipment checked, pre-op evaluation and timeout performed Spinal Block Patient position: sitting Prep: DuraPrep and site prepped and draped Patient monitoring: cardiac monitor, continuous pulse ox and blood pressure Approach: midline Location: L3-4 Injection technique: single-shot Needle Needle type: Pencan  Needle gauge: 24 G Needle length: 9 cm Assessment Sensory level: T6 Events: CSF return Additional Notes Functioning IV was confirmed and monitors were applied. Sterile prep and drape, including hand hygiene and sterile gloves were used. The patient was positioned and the spine was prepped. The skin was anesthetized with lidocaine.  Free flow of clear CSF was obtained prior to injecting local anesthetic into the CSF.  The spinal needle aspirated freely following injection.  The needle was carefully withdrawn.  The patient tolerated the procedure well.

## 2021-09-12 NOTE — Discharge Instructions (Signed)

## 2021-09-12 NOTE — Anesthesia Preprocedure Evaluation (Addendum)
Anesthesia Evaluation  Patient identified by MRN, date of birth, ID band Patient awake    Reviewed: Allergy & Precautions, NPO status , Patient's Chart, lab work & pertinent test results  Airway Mallampati: II  TM Distance: >3 FB Neck ROM: Full    Dental  (+) Teeth Intact, Dental Advisory Given   Pulmonary neg pulmonary ROS,    Pulmonary exam normal breath sounds clear to auscultation       Cardiovascular hypertension (149/61 in preop, normally 125-135 SBP, no meds), Normal cardiovascular exam Rhythm:Regular Rate:Normal  Neg stress test 2016   Neuro/Psych  Headaches, negative psych ROS   GI/Hepatic Neg liver ROS, GERD  Controlled,  Endo/Other  negative endocrine ROS  Renal/GU negative Renal ROS  negative genitourinary   Musculoskeletal  (+) Arthritis , Osteoarthritis,    Abdominal   Peds  Hematology Hb 16.7, plt 167   Anesthesia Other Findings   Reproductive/Obstetrics negative OB ROS                            Anesthesia Physical Anesthesia Plan  ASA: 3  Anesthesia Plan: MAC and Spinal   Post-op Pain Management: Tylenol PO (pre-op)*   Induction:   PONV Risk Score and Plan: 2 and Ondansetron, Propofol infusion, TIVA and Treatment may vary due to age or medical condition  Airway Management Planned: Natural Airway and Simple Face Mask  Additional Equipment: None  Intra-op Plan:   Post-operative Plan:   Informed Consent: I have reviewed the patients History and Physical, chart, labs and discussed the procedure including the risks, benefits and alternatives for the proposed anesthesia with the patient or authorized representative who has indicated his/her understanding and acceptance.       Plan Discussed with: CRNA  Anesthesia Plan Comments:         Anesthesia Quick Evaluation

## 2021-09-12 NOTE — Anesthesia Procedure Notes (Signed)
Procedure Name: MAC Date/Time: 09/12/2021 9:18 AM Performed by: Carolan Clines, CRNA Pre-anesthesia Checklist: Patient identified, Emergency Drugs available, Suction available and Patient being monitored Patient Re-evaluated:Patient Re-evaluated prior to induction Oxygen Delivery Method: Simple face mask Dental Injury: Teeth and Oropharynx as per pre-operative assessment

## 2021-09-13 DIAGNOSIS — I10 Essential (primary) hypertension: Secondary | ICD-10-CM | POA: Diagnosis not present

## 2021-09-13 DIAGNOSIS — M1611 Unilateral primary osteoarthritis, right hip: Secondary | ICD-10-CM | POA: Diagnosis not present

## 2021-09-13 DIAGNOSIS — Z7982 Long term (current) use of aspirin: Secondary | ICD-10-CM | POA: Diagnosis not present

## 2021-09-13 LAB — CBC
HCT: 40.5 % (ref 36.0–46.0)
Hemoglobin: 13.4 g/dL (ref 12.0–15.0)
MCH: 31.5 pg (ref 26.0–34.0)
MCHC: 33.1 g/dL (ref 30.0–36.0)
MCV: 95.3 fL (ref 80.0–100.0)
Platelets: 145 10*3/uL — ABNORMAL LOW (ref 150–400)
RBC: 4.25 MIL/uL (ref 3.87–5.11)
RDW: 13.5 % (ref 11.5–15.5)
WBC: 10.9 10*3/uL — ABNORMAL HIGH (ref 4.0–10.5)
nRBC: 0 % (ref 0.0–0.2)

## 2021-09-13 NOTE — Progress Notes (Signed)
Subjective: 1 Day Post-Op Procedure(s) (LRB): RIGHT TOTAL HIP ARTHROPLASTY ANTERIOR APPROACH (Right) Patient reports pain as mild.    Objective: Vital signs in last 24 hours: Temp:  [97.5 F (36.4 C)-98.7 F (37.1 C)] 97.9 F (36.6 C) (05/23 0423) Pulse Rate:  [49-77] 77 (05/23 0423) Resp:  [11-20] 20 (05/23 0423) BP: (141-158)/(67-89) 141/71 (05/23 0423) SpO2:  [99 %-100 %] 100 % (05/23 0423)  Intake/Output from previous day: 05/22 0701 - 05/23 0700 In: 300.3 [I.V.:50.3; IV Piggyback:200] Out: 2600 [Urine:2400; Blood:200] Intake/Output this shift: No intake/output data recorded.  Recent Labs    09/13/21 0547  HGB 13.4   Recent Labs    09/13/21 0547  WBC 10.9*  RBC 4.25  HCT 40.5  PLT 145*   No results for input(s): NA, K, CL, CO2, BUN, CREATININE, GLUCOSE, CALCIUM in the last 72 hours. No results for input(s): LABPT, INR in the last 72 hours.  Neurologically intact Neurovascular intact Sensation intact distally Intact pulses distally Dorsiflexion/Plantar flexion intact Incision: dressing C/D/I No cellulitis present Compartment soft   Assessment/Plan: 1 Day Post-Op Procedure(s) (LRB): RIGHT TOTAL HIP ARTHROPLASTY ANTERIOR APPROACH (Right) Advance diet Up with therapy D/C IV fluids WBAT RLE D/c home after first or second PT session depending on how well she mobilizes.      Alyssa Robinson 09/13/2021, 8:27 AM

## 2021-09-13 NOTE — Discharge Summary (Signed)
Patient ID: Alyssa Robinson MRN: [id number] DOB/AGE: 1951/03/18 71 y.o.  Admit date: 09/12/2021 Discharge date: 09/13/2021  Admission Diagnoses:  Principal Problem:   Primary osteoarthritis of right hip Active Problems:   Status post total replacement of right hip   Discharge Diagnoses:  Same  Past Medical History:  Diagnosis Date   Anemia    hx   Bronchitis    Bronchitis    hx   Closed patellar sleeve fracture of left knee    Diverticulosis    GERD (gastroesophageal reflux disease)    occ   Headache(784.0)    migraines occ   Hypertension    no meds in over 75yr   Pneumonia    Seasonal allergies     Surgeries: Procedure(s): RIGHT TOTAL HIP ARTHROPLASTY ANTERIOR APPROACH on 09/12/2021   Consultants:   Discharged Condition: Improved  Hospital Course: Alyssa PLATTSis an 71y.o. female who was admitted 09/12/2021 for operative treatment ofPrimary osteoarthritis of right hip. Patient has severe unremitting pain that affects sleep, daily activities, and work/hobbies. After pre-op clearance the patient was taken to the operating room on 09/12/2021 and underwent  Procedure(s): RIGHT TOTAL HIP ARTHROPLASTY ANTERIOR APPROACH.    Patient was given perioperative antibiotics:  Anti-infectives (From admission, onward)    Start     Dose/Rate Route Frequency Ordered Stop   09/12/21 1600  ceFAZolin (ANCEF) IVPB 2g/100 mL premix        2 g 200 mL/hr over 30 Minutes Intravenous Every 6 hours 09/12/21 1214 09/12/21 2149   09/12/21 0958  vancomycin (VANCOCIN) powder  Status:  Discontinued          As needed 09/12/21 0958 09/12/21 1104   09/12/21 0645  ceFAZolin (ANCEF) IVPB 2g/100 mL premix        2 g 200 mL/hr over 30 Minutes Intravenous On call to O.R. 09/12/21 0301605/22/23 0920        Patient was given sequential compression devices, early ambulation, and chemoprophylaxis to prevent DVT.  Patient benefited maximally from hospital stay and there were no complications.     Recent vital signs: Patient Vitals for the past 24 hrs:  BP Temp Temp src Pulse Resp SpO2  09/13/21 0423 (!) 141/71 97.9 F (36.6 C) Oral 77 20 100 %  09/12/21 2318 (!) 148/72 98.2 F (36.8 C) Oral 74 20 100 %  09/12/21 2032 (!) 141/67 98.7 F (37.1 C) Oral 69 18 99 %  09/12/21 1542 (!) 145/77 (!) 97.5 F (36.4 C) -- 64 16 100 %  09/12/21 1236 (!) 158/83 97.8 F (36.6 C) -- (!) 53 18 100 %  09/12/21 1210 (!) 153/78 97.9 F (36.6 C) -- (!) 51 12 100 %  09/12/21 1155 (!) 146/84 -- -- (!) 49 12 100 %  09/12/21 1140 (!) 149/77 -- -- (!) 54 11 100 %  09/12/21 1125 (!) 147/80 -- -- (!) 50 12 100 %  09/12/21 1110 (!) 149/89 97.9 F (36.6 C) -- -- 19 99 %     Recent laboratory studies:  Recent Labs    09/13/21 0547  WBC 10.9*  HGB 13.4  HCT 40.5  PLT 145*     Discharge Medications:   Allergies as of 09/13/2021       Reactions   Caffeine Palpitations, Nausea And Vomiting   Heart racing with high doses Makes heart race Heart racing Makes heart race Heart racing with high doses Makes heart race Makes heart race   Sodium Lauryl Sulfate Rash  Swollen gums        Medication List     TAKE these medications    aspirin EC 81 MG tablet Take 1 tablet (81 mg total) by mouth 2 (two) times daily. To be taken after surgery to prevent blood clots   docusate sodium 100 MG capsule Commonly known as: Colace Take 1 capsule (100 mg total) by mouth daily as needed.   fexofenadine 180 MG tablet Commonly known as: ALLEGRA Take 180 mg by mouth at bedtime.   methocarbamol 500 MG tablet Commonly known as: ROBAXIN Take 1 tablet (500 mg total) by mouth 2 (two) times daily as needed for muscle spasms. To be taken after surgery   ondansetron 4 MG tablet Commonly known as: Zofran Take 1 tablet (4 mg total) by mouth every 8 (eight) hours as needed for nausea or vomiting.   oxyCODONE-acetaminophen 5-325 MG tablet Commonly known as: Percocet Take 1-2 tablets by mouth every 6  (six) hours as needed. To be taken after surgery   pediatric multivitamin chewable tablet Chew 1 tablet by mouth daily. [place]   RECTICARE EX Apply 1 application. topically daily as needed (rectal care).   sodium chloride 0.65 % Soln nasal spray Commonly known as: OCEAN Place 1 spray into both nostrils as needed for congestion.   UltraFlora Immune Health 170 MG Caps Take 1 tablet by mouth in the morning.               Durable Medical Equipment  (From admission, onward)           Start     Ordered   09/12/21 1231  DME Walker rolling  Once       Question:  Patient needs a walker to treat with the following condition  Answer:  History of hip replacement   09/12/21 1230   09/12/21 1231  DME 3 n 1  Once        09/12/21 1230   09/12/21 1231  DME Bedside commode  Once       Question:  Patient needs a bedside commode to treat with the following condition  Answer:  History of hip replacement   09/12/21 1230            Diagnostic Studies: DG Pelvis Portable  Result Date: 09/12/2021 CLINICAL DATA:  Right hip replacement. EXAM: PORTABLE PELVIS 1-2 VIEWS COMPARISON:  None Available. FINDINGS: The right acetabular and femoral components are well situated. Expected postoperative changes are seen in the surrounding soft tissues. IMPRESSION: Status post right total hip arthroplasty. Electronically Signed   By: Marijo Conception M.D.   On: 09/12/2021 12:24   DG C-Arm 1-60 Min-No Report  Result Date: 09/12/2021 Fluoroscopy was utilized by the requesting physician.  No radiographic interpretation.   DG C-Arm 1-60 Min-No Report  Result Date: 09/12/2021 Fluoroscopy was utilized by the requesting physician.  No radiographic interpretation.   DG HIP UNILAT WITH PELVIS 2-3 VIEWS RIGHT  Result Date: 09/12/2021 CLINICAL DATA:  Status post right total hip arthroplasty. EXAM: DG HIP (WITH OR WITHOUT PELVIS) 2-3V RIGHT; DG C-ARM 1-60 MIN-NO REPORT Radiation exposure index: 2.7409 mGy.  COMPARISON:  June 28, 2021. FINDINGS: Four intraoperative fluoroscopic images were obtained of the right hip. The right acetabular and femoral components appear to be well situated. IMPRESSION: Fluoroscopic guidance provided during right total hip arthroplasty. Electronically Signed   By: Marijo Conception M.D.   On: 09/12/2021 10:48    Disposition: Discharge disposition: 01-Home or Self Care  Follow-up Information     [facility] Follow up.   Specialty: Home Health Services Why: Representative from [facility] will contact you to schedule start of home health services. Contact information: [address] [place] [address] [phone]         Leandrew Koyanagi, MD. Schedule an appointment as soon as possible for a visit in 2 week(s).   Specialty: Orthopedic Surgery Contact information: [address] [place] [address] [phone]                  Signed: Aundra Dubin 09/13/2021, 8:27 AM

## 2021-09-13 NOTE — Progress Notes (Signed)
Physical Therapy Treatment Patient Details Name: Alyssa Robinson MRN: [id number] DOB: [birth year] Today's Date: 09/13/2021   History of Present Illness Pt is a 71 y/o female s/p R THA. PMH includes HTN.    PT Comments    Patient progressing well towards PT goals. Session focused on gait progression and there ex. Tolerated bed mobility, transfers and gait training with supervision and use of RW for support. Reviewed and provided HEP handout. Discussed importance of walking program, positioning, activity recommendations, car transfer etc. Pt eager to get to back to golf. Pt has support from spouse at home. Will continue to follow and progress as tolerated to improve overall strength, balance and mobility.     Recommendations for follow up therapy are one component of a multi-disciplinary discharge planning process, led by the attending physician.  Recommendations may be updated based on patient status, additional functional criteria and insurance authorization.  Follow Up Recommendations  Follow physician's recommendations for discharge plan and follow up therapies     Assistance Recommended at Discharge Intermittent Supervision/Assistance  Patient can return home with the following Assistance with cooking/housework;Assist for transportation   Equipment Recommendations  None recommended by PT    Recommendations for Other Services       Precautions / Restrictions Precautions Precautions: Fall Restrictions Weight Bearing Restrictions: Yes RLE Weight Bearing: Weight bearing as tolerated     Mobility  Bed Mobility Overal bed mobility: Needs Assistance Bed Mobility: Supine to Sit, Sit to Supine     Supine to sit: Supervision Sit to supine: Supervision   General bed mobility comments: HOB flat to simulate home and no use of rails. Able to bring RLE into/out of bed without assist. Instructed pt using LLE or sheet to assist as needed.    Transfers Overall transfer level: Needs  assistance Equipment used: Rolling walker (2 wheels) Transfers: Sit to/from Stand Sit to Stand: Supervision           General transfer comment: Supervision for safety. Stood from EOBx 2, from toilet x1. Cues for hand placement/technique.    Ambulation/Gait Ambulation/Gait assistance: Supervision Gait Distance (Feet): 200 Feet Assistive device: Rolling walker (2 wheels) Gait Pattern/deviations: Step-through pattern, Decreased weight shift to right, Decreased stance time - right, Decreased step length - left Gait velocity: decreased Gait velocity interpretation: <1.31 ft/sec, indicative of household ambulator   General Gait Details: Cues for normalized gait mechanics with swing through gait to advance RLE allowing knee flexion and avoiding hip circumduction, heavy reliance on UEs.   Stairs             Wheelchair Mobility    Modified Rankin (Stroke Patients Only)       Balance Overall balance assessment: Needs assistance Sitting-balance support: Feet supported, No upper extremity supported Sitting balance-Leahy Scale: Fair     Standing balance support: During functional activity, Reliant on assistive device for balance Standing balance-Leahy Scale: Poor Standing balance comment: Reliant on BUE support                            Cognition Arousal/Alertness: Awake/alert Behavior During Therapy: WFL for tasks assessed/performed Overall Cognitive Status: Within Functional Limits for tasks assessed                                          Exercises Total Joint Exercises Hip ABduction/ADduction: Strengthening, Both,  5 reps, Standing Marching in Standing: Strengthening, Both, 5 reps, Standing Standing Hip Extension: Strengthening, Both, 5 reps, Standing    General Comments General comments (skin integrity, edema, etc.): Spouse present during session.      Pertinent Vitals/Pain Pain Assessment Pain Assessment: 0-10 Pain Score: 4   Pain Location: Rt hip Pain Descriptors / Indicators: Guarding, Sore Pain Intervention(s): Monitored during session, Repositioned, Premedicated before session    Home Living                          Prior Function            PT Goals (current goals can now be found in the care plan section) Progress towards PT goals: Progressing toward goals    Frequency    7X/week      PT Plan Current plan remains appropriate    Co-evaluation              AM-PAC PT "6 Clicks" Mobility   Outcome Measure  Help needed turning from your back to your side while in a flat bed without using bedrails?: A Little Help needed moving from lying on your back to sitting on the side of a flat bed without using bedrails?: A Little Help needed moving to and from a bed to a chair (including a wheelchair)?: A Little Help needed standing up from a chair using your arms (e.g., wheelchair or bedside chair)?: A Little Help needed to walk in hospital room?: A Little Help needed climbing 3-5 steps with a railing? : A Little 6 Click Score: 18    End of Session Equipment Utilized During Treatment: Gait belt Activity Tolerance: Patient tolerated treatment well Patient left: in bed;with call bell/phone within reach;with family/visitor present Nurse Communication: Mobility status PT Visit Diagnosis: Other abnormalities of gait and mobility (R26.89);Pain Pain - Right/Left: Right Pain - part of body: Hip     Time: 5701-7793 PT Time Calculation (min) (ACUTE ONLY): 28 min  Charges:  $Gait Training: 8-22 mins $Therapeutic Exercise: 8-22 mins                     Marisa Severin, PT, DPT Acute Rehabilitation Services Secure chat preferred Office [place] 09/13/2021, 9:31 AM

## 2021-09-13 NOTE — Progress Notes (Signed)
Patient alert and oriented, mae's well, voiding adequate amount of urine, swallowing without difficulty, no c/o pain at time of discharge. Patient discharged home with family. Script and discharged instructions given to patient. Patient and family stated understanding of instructions given. Patient has an appointment with Dr. Xu 

## 2021-09-14 ENCOUNTER — Telehealth: Payer: Self-pay | Admitting: *Deleted

## 2021-09-14 DIAGNOSIS — Z8701 Personal history of pneumonia (recurrent): Secondary | ICD-10-CM | POA: Diagnosis not present

## 2021-09-14 DIAGNOSIS — Z7982 Long term (current) use of aspirin: Secondary | ICD-10-CM | POA: Diagnosis not present

## 2021-09-14 DIAGNOSIS — Z471 Aftercare following joint replacement surgery: Secondary | ICD-10-CM | POA: Diagnosis not present

## 2021-09-14 DIAGNOSIS — K219 Gastro-esophageal reflux disease without esophagitis: Secondary | ICD-10-CM | POA: Diagnosis not present

## 2021-09-14 DIAGNOSIS — I10 Essential (primary) hypertension: Secondary | ICD-10-CM | POA: Diagnosis not present

## 2021-09-14 DIAGNOSIS — Z9049 Acquired absence of other specified parts of digestive tract: Secondary | ICD-10-CM | POA: Diagnosis not present

## 2021-09-14 DIAGNOSIS — D649 Anemia, unspecified: Secondary | ICD-10-CM | POA: Diagnosis not present

## 2021-09-14 DIAGNOSIS — K5790 Diverticulosis of intestine, part unspecified, without perforation or abscess without bleeding: Secondary | ICD-10-CM | POA: Diagnosis not present

## 2021-09-14 DIAGNOSIS — M8580 Other specified disorders of bone density and structure, unspecified site: Secondary | ICD-10-CM | POA: Diagnosis not present

## 2021-09-14 DIAGNOSIS — Z96641 Presence of right artificial hip joint: Secondary | ICD-10-CM | POA: Diagnosis not present

## 2021-09-14 DIAGNOSIS — E785 Hyperlipidemia, unspecified: Secondary | ICD-10-CM | POA: Diagnosis not present

## 2021-09-14 DIAGNOSIS — N952 Postmenopausal atrophic vaginitis: Secondary | ICD-10-CM | POA: Diagnosis not present

## 2021-09-14 DIAGNOSIS — K862 Cyst of pancreas: Secondary | ICD-10-CM | POA: Diagnosis not present

## 2021-09-14 DIAGNOSIS — J309 Allergic rhinitis, unspecified: Secondary | ICD-10-CM | POA: Diagnosis not present

## 2021-09-14 NOTE — Telephone Encounter (Signed)
Ortho bundle D/C call completed. 

## 2021-09-17 DIAGNOSIS — M8580 Other specified disorders of bone density and structure, unspecified site: Secondary | ICD-10-CM | POA: Diagnosis not present

## 2021-09-17 DIAGNOSIS — I10 Essential (primary) hypertension: Secondary | ICD-10-CM | POA: Diagnosis not present

## 2021-09-17 DIAGNOSIS — Z471 Aftercare following joint replacement surgery: Secondary | ICD-10-CM | POA: Diagnosis not present

## 2021-09-17 DIAGNOSIS — K5790 Diverticulosis of intestine, part unspecified, without perforation or abscess without bleeding: Secondary | ICD-10-CM | POA: Diagnosis not present

## 2021-09-17 DIAGNOSIS — K219 Gastro-esophageal reflux disease without esophagitis: Secondary | ICD-10-CM | POA: Diagnosis not present

## 2021-09-17 DIAGNOSIS — D649 Anemia, unspecified: Secondary | ICD-10-CM | POA: Diagnosis not present

## 2021-09-20 ENCOUNTER — Telehealth: Payer: Self-pay | Admitting: Orthopaedic Surgery

## 2021-09-20 NOTE — Telephone Encounter (Signed)
Pt called requesting a call back from [place] with questions about her pains medications. Spoke with Kathlee Nations and Kathlee Nations stated to get as much info from pt. Pt refused to tell me questions she has. She only stated that its multiple questions and want to speak to Kathlee Nations only. Please call pt at [phone].

## 2021-09-20 NOTE — Telephone Encounter (Signed)
Pt called and has some questions about the pain medicine.   CB [phone]

## 2021-09-21 ENCOUNTER — Encounter: Payer: Self-pay | Admitting: Orthopaedic Surgery

## 2021-09-21 DIAGNOSIS — K5790 Diverticulosis of intestine, part unspecified, without perforation or abscess without bleeding: Secondary | ICD-10-CM | POA: Diagnosis not present

## 2021-09-21 DIAGNOSIS — Z471 Aftercare following joint replacement surgery: Secondary | ICD-10-CM | POA: Diagnosis not present

## 2021-09-21 DIAGNOSIS — M8580 Other specified disorders of bone density and structure, unspecified site: Secondary | ICD-10-CM | POA: Diagnosis not present

## 2021-09-21 DIAGNOSIS — K219 Gastro-esophageal reflux disease without esophagitis: Secondary | ICD-10-CM | POA: Diagnosis not present

## 2021-09-21 DIAGNOSIS — I10 Essential (primary) hypertension: Secondary | ICD-10-CM | POA: Diagnosis not present

## 2021-09-21 DIAGNOSIS — D649 Anemia, unspecified: Secondary | ICD-10-CM | POA: Diagnosis not present

## 2021-09-21 NOTE — Telephone Encounter (Signed)
She sent mychart message.

## 2021-09-22 ENCOUNTER — Telehealth: Payer: Self-pay | Admitting: *Deleted

## 2021-09-22 ENCOUNTER — Other Ambulatory Visit: Payer: Self-pay | Admitting: Physician Assistant

## 2021-09-22 DIAGNOSIS — K5790 Diverticulosis of intestine, part unspecified, without perforation or abscess without bleeding: Secondary | ICD-10-CM | POA: Diagnosis not present

## 2021-09-22 DIAGNOSIS — D649 Anemia, unspecified: Secondary | ICD-10-CM | POA: Diagnosis not present

## 2021-09-22 DIAGNOSIS — I10 Essential (primary) hypertension: Secondary | ICD-10-CM | POA: Diagnosis not present

## 2021-09-22 DIAGNOSIS — M8580 Other specified disorders of bone density and structure, unspecified site: Secondary | ICD-10-CM | POA: Diagnosis not present

## 2021-09-22 DIAGNOSIS — Z471 Aftercare following joint replacement surgery: Secondary | ICD-10-CM | POA: Diagnosis not present

## 2021-09-22 DIAGNOSIS — K219 Gastro-esophageal reflux disease without esophagitis: Secondary | ICD-10-CM | POA: Diagnosis not present

## 2021-09-22 MED ORDER — TRAMADOL HCL 50 MG PO TABS: 50.0000 mg | ORAL_TABLET | Freq: Four times a day (QID) | ORAL | 2 refills | Status: DC | PRN

## 2021-09-22 NOTE — Telephone Encounter (Signed)
Ok, thanks.

## 2021-09-22 NOTE — Telephone Encounter (Signed)
Spoke to patient today and I saw all the [employer]. She really is scared of the Percocet and doesn't want to take, but is really taking Tylenol very scheduled. Do you think some Tramadol would be a better option for her? Sounds like she is very sensitive to medications. Recommendations? Also really not getting much sleep. She's a little nervous about the muscle relaxer too as it made her very woozie. Thanks.

## 2021-09-22 NOTE — Telephone Encounter (Signed)
Spoke to her as well.

## 2021-09-22 NOTE — Telephone Encounter (Signed)
I think tramadol is worth trying and I just sent in.  Is she not sleeping bc of pain or just insomnia?  If insomnia, I would try melatonin first and then if that doesn't work, may try benadryl the next night

## 2021-09-26 DIAGNOSIS — I10 Essential (primary) hypertension: Secondary | ICD-10-CM | POA: Diagnosis not present

## 2021-09-26 DIAGNOSIS — K219 Gastro-esophageal reflux disease without esophagitis: Secondary | ICD-10-CM | POA: Diagnosis not present

## 2021-09-26 DIAGNOSIS — M8580 Other specified disorders of bone density and structure, unspecified site: Secondary | ICD-10-CM | POA: Diagnosis not present

## 2021-09-26 DIAGNOSIS — K5790 Diverticulosis of intestine, part unspecified, without perforation or abscess without bleeding: Secondary | ICD-10-CM | POA: Diagnosis not present

## 2021-09-26 DIAGNOSIS — Z471 Aftercare following joint replacement surgery: Secondary | ICD-10-CM | POA: Diagnosis not present

## 2021-09-26 DIAGNOSIS — D649 Anemia, unspecified: Secondary | ICD-10-CM | POA: Diagnosis not present

## 2021-09-27 ENCOUNTER — Telehealth: Payer: Self-pay | Admitting: *Deleted

## 2021-09-27 ENCOUNTER — Ambulatory Visit (INDEPENDENT_AMBULATORY_CARE_PROVIDER_SITE_OTHER): Payer: Medicare Other | Admitting: Orthopaedic Surgery

## 2021-09-27 ENCOUNTER — Encounter: Payer: Self-pay | Admitting: Orthopaedic Surgery

## 2021-09-27 DIAGNOSIS — Z96641 Presence of right artificial hip joint: Secondary | ICD-10-CM

## 2021-09-27 NOTE — Progress Notes (Signed)
Post-Op Visit Note   Patient: Alyssa Robinson           Date of Birth: Apr 15, 1951           MRN: [id number] Visit Date: 09/27/2021 PCP: Hoyt Koch, MD   Assessment & Plan:  Chief Complaint:  Chief Complaint  Patient presents with   Right Hip - Follow-up    Right total hip arthroplasty 09/12/2021   Visit Diagnoses:  1. Status post total replacement of right hip     Plan: [name] is a 71 year old female 2 weeks status post right total hip replacement on 09/12/2021.  Doing home exercise and home health PT.  Takes tramadol and ibuprofen.  Has started having some constipation again.  Examination of right hip shows a healed surgical incision.  Minimal swelling.  No signs of infection.  Memory is doing well overall.  I think her recovery has been hampered slightly due to her reluctance to take pain medication which has affected her ability to sleep at night and recover.  I think is fine for her to take ibuprofen and tramadol more regularly so that she can feel better.  Continue with aspirin for DVT prophylaxis.  Sutures removed.  Dental prophylaxis reinforced.  Recheck in 4 weeks with standing AP pelvis x-rays.  Follow-Up Instructions: No follow-ups on file.   Orders:  No orders of the defined types were placed in this encounter.  No orders of the defined types were placed in this encounter.   Imaging: No results found.  PMFS History: Patient Active Problem List   Diagnosis Date Noted   Status post total replacement of right hip 09/12/2021   Primary osteoarthritis of right hip 09/11/2021   Urinary frequency 07/05/2021   Family history of early CAD 07/02/2020   Lipoma of abdominal wall 08/04/2019   Osteopenia 01/28/2019   Allergic rhinitis 01/28/2019   History of shingles 01/28/2019   Thrombocytopenia ([place]) 10/17/2017   Hyperlipidemia with target LDL less than 130 09/14/2015   Vaginal atrophy 06/01/2015   Cyst of pancreas 03/06/2015   Routine general medical  examination at a health care facility 12/11/2012   Essential hypertension, benign 12/11/2012   Constipation 10/17/2012   Past Medical History:  Diagnosis Date   Anemia    hx   Bronchitis    Bronchitis    hx   Closed patellar sleeve fracture of left knee    Diverticulosis    GERD (gastroesophageal reflux disease)    occ   Headache(784.0)    migraines occ   Hypertension    no meds in over 2yr   Pneumonia    Seasonal allergies     Family History  Problem Relation Age of Onset   Esophageal cancer Mother        and ovarian cancer   Alcohol abuse Mother    Arthritis Mother    Heart disease Father    Alcohol abuse Father    Hypertension Father    Diabetes Father    Early death Father    Breast cancer Sister    Colon cancer Other    Pancreatic cancer Other    Stroke Neg Hx    Hyperlipidemia Neg Hx    Kidney disease Neg Hx    COPD Neg Hx     Past Surgical History:  Procedure Laterality Date   ABDOMINAL HYSTERECTOMY     71y/o   APPENDECTOMY  03/17/2013   Procedure: INCIDENTAL APPENDECTOMY;  Surgeon: JGwenyth Ober MD;  Location:  [facility];  Service: General;;   capsule endoscopy     COLON SURGERY     COLONOSCOPY     in 2012 -    COLOSTOMY CLOSURE  03/17/2013   COLOSTOMY REVERSAL  03/17/2013   Procedure: COLOSTOMY REVERSAL;  Surgeon: Gwenyth Ober, MD;  Location: [facility];  Service: General;;   EUS N/A 04/30/2015   Procedure: UPPER ENDOSCOPIC ULTRASOUND (EUS) LINEAR;  Surgeon: Carol Ada, MD;  Location: [facility];  Service: Endoscopy;  Laterality: N/A;   HERNIA REPAIR     X2 2-4 y/o inguinal   LAPAROTOMY N/A 10/17/2012   Procedure: EXPLORATORY LAPAROTOMY,  COLON RESECTION AND COLOSTOMY;  Surgeon: Gwenyth Ober, MD;  Location: [facility];  Service: General;  Laterality: N/A;   LYSIS OF ADHESION  03/17/2013   Procedure: LYSIS OF ADHESION;  Surgeon: Gwenyth Ober, MD;  Location: [facility];  Service: General;;   ORIF PATELLA Left 02/20/2020   Procedure: OPEN REDUCTION INTERNAL  (ORIF) FIXATION LEFT PATELLA;  Surgeon: Leandrew Koyanagi, MD;  Location: [facility];  Service: Orthopedics;  Laterality: Left;   TOTAL HIP ARTHROPLASTY Right 09/12/2021   Procedure: RIGHT TOTAL HIP ARTHROPLASTY ANTERIOR APPROACH;  Surgeon: Leandrew Koyanagi, MD;  Location: [facility];  Service: Orthopedics;  Laterality: Right;  3-C   UPPER GASTROINTESTINAL ENDOSCOPY     2013 -   Social History   Occupational History   Not on file  Tobacco Use   Smoking status: Never   Smokeless tobacco: Never   Tobacco comments:    EXPOSED TO SECOND HAND SMOKE   Vaping Use   Vaping Use: Never used  Substance and Sexual Activity   Alcohol use: Not Currently    Alcohol/week: 2.0 standard drinks    Types: 2 Glasses of wine per week   Drug use: No   Sexual activity: Not Currently    Birth control/protection: Post-menopausal

## 2021-09-27 NOTE — Telephone Encounter (Signed)
14 day in office Ortho bundle call completed.

## 2021-09-29 DIAGNOSIS — I10 Essential (primary) hypertension: Secondary | ICD-10-CM | POA: Diagnosis not present

## 2021-09-29 DIAGNOSIS — D649 Anemia, unspecified: Secondary | ICD-10-CM | POA: Diagnosis not present

## 2021-09-29 DIAGNOSIS — K219 Gastro-esophageal reflux disease without esophagitis: Secondary | ICD-10-CM | POA: Diagnosis not present

## 2021-09-29 DIAGNOSIS — M8580 Other specified disorders of bone density and structure, unspecified site: Secondary | ICD-10-CM | POA: Diagnosis not present

## 2021-09-29 DIAGNOSIS — K5790 Diverticulosis of intestine, part unspecified, without perforation or abscess without bleeding: Secondary | ICD-10-CM | POA: Diagnosis not present

## 2021-09-29 DIAGNOSIS — Z471 Aftercare following joint replacement surgery: Secondary | ICD-10-CM | POA: Diagnosis not present

## 2021-10-14 ENCOUNTER — Telehealth: Payer: Self-pay | Admitting: *Deleted

## 2021-10-26 ENCOUNTER — Ambulatory Visit: Payer: Medicare Other

## 2021-10-26 ENCOUNTER — Ambulatory Visit (INDEPENDENT_AMBULATORY_CARE_PROVIDER_SITE_OTHER): Payer: Medicare Other | Admitting: Physician Assistant

## 2021-10-26 DIAGNOSIS — Z96641 Presence of right artificial hip joint: Secondary | ICD-10-CM

## 2021-10-26 MED ORDER — AMOXICILLIN 500 MG PO CAPS: ORAL_CAPSULE | ORAL | 2 refills | Status: DC

## 2021-10-26 NOTE — Progress Notes (Signed)
Post-Op Visit Note   Patient: Alyssa Robinson           Date of Birth: 1951-03-18           MRN: [id number] Visit Date: 10/26/2021 PCP: Hoyt Koch, MD   Assessment & Plan:  Chief Complaint:  Chief Complaint  Patient presents with   Right Hip - Routine Post Op    09/12/21 right THA   Visit Diagnoses:  1. Status post total replacement of right hip     Plan: Patient is a pleasant 71 year old female who comes in today 6 weeks status post right total hip replacement 09/12/2021.  She is doing great.  She is walking without assistance.  She has been compliant taking a baby aspirin twice daily.  At this point, she may discontinue her aspirin.  Continue to increase activity as tolerated.  Dental prophylaxis reinforced.  Follow-up in 6 weeks for repeat evaluation.  Call with concerns or questions.  Follow-Up Instructions: Return in about 6 weeks (around 12/07/2021).   Orders:  Orders Placed This Encounter  Procedures   XR Pelvis 1-2 Views   No orders of the defined types were placed in this encounter.   Imaging: No results found.  PMFS History: Patient Active Problem List   Diagnosis Date Noted   Status post total replacement of right hip 09/12/2021   Primary osteoarthritis of right hip 09/11/2021   Urinary frequency 07/05/2021   Family history of early CAD 07/02/2020   Lipoma of abdominal wall 08/04/2019   Osteopenia 01/28/2019   Allergic rhinitis 01/28/2019   History of shingles 01/28/2019   Thrombocytopenia ([place]) 10/17/2017   Hyperlipidemia with target LDL less than 130 09/14/2015   Vaginal atrophy 06/01/2015   Cyst of pancreas 03/06/2015   Routine general medical examination at a health care facility 12/11/2012   Essential hypertension, benign 12/11/2012   Constipation 10/17/2012   Past Medical History:  Diagnosis Date   Anemia    hx   Bronchitis    Bronchitis    hx   Closed patellar sleeve fracture of left knee    Diverticulosis    GERD  (gastroesophageal reflux disease)    occ   Headache(784.0)    migraines occ   Hypertension    no meds in over 26yr   Pneumonia    Seasonal allergies     Family History  Problem Relation Age of Onset   Esophageal cancer Mother        and ovarian cancer   Alcohol abuse Mother    Arthritis Mother    Heart disease Father    Alcohol abuse Father    Hypertension Father    Diabetes Father    Early death Father    Breast cancer Sister    Colon cancer Other    Pancreatic cancer Other    Stroke Neg Hx    Hyperlipidemia Neg Hx    Kidney disease Neg Hx    COPD Neg Hx     Past Surgical History:  Procedure Laterality Date   ABDOMINAL HYSTERECTOMY     71y/o   APPENDECTOMY  03/17/2013   Procedure: INCIDENTAL APPENDECTOMY;  Surgeon: JGwenyth Ober MD;  [facility]  Service: General;;   capsule endoscopy     COLON SURGERY     COLONOSCOPY     in 2012 -    COLOSTOMY CLOSURE  03/17/2013   COLOSTOMY REVERSAL  03/17/2013   Procedure: COLOSTOMY REVERSAL;  Surgeon: JGwenyth Ober MD;  Location: [facility];  Service: General;;   EUS N/A 04/30/2015   Procedure: UPPER ENDOSCOPIC ULTRASOUND (EUS) LINEAR;  Surgeon: Carol Ada, MD;  [facility];  Service: Endoscopy;  Laterality: N/A;   HERNIA REPAIR     X2 2-4 y/o inguinal   LAPAROTOMY N/A 10/17/2012   Procedure: EXPLORATORY LAPAROTOMY,  COLON RESECTION AND COLOSTOMY;  Surgeon: Gwenyth Ober, MD;  Location: [facility];  Service: General;  Laterality: N/A;   LYSIS OF ADHESION  03/17/2013   Procedure: LYSIS OF ADHESION;  Surgeon: Gwenyth Ober, MD;  Location: [facility];  Service: General;;   ORIF PATELLA Left 02/20/2020   Procedure: OPEN REDUCTION INTERNAL (ORIF) FIXATION LEFT PATELLA;  Surgeon: Leandrew Koyanagi, MD;  Location: [facility];  Service: Orthopedics;  Laterality: Left;   TOTAL HIP ARTHROPLASTY Right 09/12/2021   Procedure: RIGHT TOTAL HIP ARTHROPLASTY ANTERIOR APPROACH;  Surgeon: Leandrew Koyanagi, MD;  Location: [facility];   Service: Orthopedics;  Laterality: Right;  3-C   UPPER GASTROINTESTINAL ENDOSCOPY     2013 -   Social History   Occupational History   Not on file  Tobacco Use   Smoking status: Never   Smokeless tobacco: Never   Tobacco comments:    EXPOSED TO SECOND HAND SMOKE   Vaping Use   Vaping Use: Never used  Substance and Sexual Activity   Alcohol use: Not Currently    Alcohol/week: 2.0 standard drinks of alcohol    Types: 2 Glasses of wine per week   Drug use: No   Sexual activity: Not Currently    Birth control/protection: Post-menopausal

## 2021-11-14 DIAGNOSIS — H2513 Age-related nuclear cataract, bilateral: Secondary | ICD-10-CM | POA: Diagnosis not present

## 2021-11-14 DIAGNOSIS — H40013 Open angle with borderline findings, low risk, bilateral: Secondary | ICD-10-CM | POA: Diagnosis not present

## 2021-12-13 ENCOUNTER — Encounter: Payer: Self-pay | Admitting: Orthopaedic Surgery

## 2021-12-13 ENCOUNTER — Ambulatory Visit (INDEPENDENT_AMBULATORY_CARE_PROVIDER_SITE_OTHER): Payer: Medicare Other | Admitting: Orthopaedic Surgery

## 2021-12-13 DIAGNOSIS — Z96641 Presence of right artificial hip joint: Secondary | ICD-10-CM

## 2021-12-13 NOTE — Progress Notes (Signed)
Post-Op Visit Note   Patient: Alyssa Robinson           Date of Birth: January 29, 1951           MRN: [id number] Visit Date: 12/13/2021 PCP: Hoyt Koch, MD   Assessment & Plan:  Chief Complaint:  Chief Complaint  Patient presents with   Right Hip - Follow-up    Right total hip arthroplasty 5/22/202   Visit Diagnoses:  1. Status post total replacement of right hip     Plan: Nichele is 3 months status post right total hip on 09/12/2021.  She is doing great overall.  Has no complaints.  Plays golf and does line dancing.  Examination of right hip shows a fully healed surgical scar.  Excellent gait and ambulation.  Full range of motion and circumduction without pain.  Dental prophylaxis reinforced.  Raford Pitcher has done very well from her surgery.  Increase activity as tolerated.  Dental prophylaxis reinforced.  Recheck in 3 months for standing AP pelvis x-rays.  Follow-Up Instructions: Return in about 3 months (around 03/15/2022).   Orders:  No orders of the defined types were placed in this encounter.  No orders of the defined types were placed in this encounter.   Imaging: No results found.  PMFS History: Patient Active Problem List   Diagnosis Date Noted   Status post total replacement of right hip 09/12/2021   Primary osteoarthritis of right hip 09/11/2021   Urinary frequency 07/05/2021   Family history of early CAD 07/02/2020   Lipoma of abdominal wall 08/04/2019   Osteopenia 01/28/2019   Allergic rhinitis 01/28/2019   History of shingles 01/28/2019   Thrombocytopenia ([place]) 10/17/2017   Hyperlipidemia with target LDL less than 130 09/14/2015   Vaginal atrophy 06/01/2015   Cyst of pancreas 03/06/2015   Routine general medical examination at a health care facility 12/11/2012   Essential hypertension, benign 12/11/2012   Constipation 10/17/2012   Past Medical History:  Diagnosis Date   Anemia    hx   Bronchitis    Bronchitis    hx   Closed patellar sleeve  fracture of left knee    Diverticulosis    GERD (gastroesophageal reflux disease)    occ   Headache(784.0)    migraines occ   Hypertension    no meds in over 73yr   Pneumonia    Seasonal allergies     Family History  Problem Relation Age of Onset   Esophageal cancer Mother        and ovarian cancer   Alcohol abuse Mother    Arthritis Mother    Heart disease Father    Alcohol abuse Father    Hypertension Father    Diabetes Father    Early death Father    Breast cancer Sister    Colon cancer Other    Pancreatic cancer Other    Stroke Neg Hx    Hyperlipidemia Neg Hx    Kidney disease Neg Hx    COPD Neg Hx     Past Surgical History:  Procedure Laterality Date   ABDOMINAL HYSTERECTOMY     71y/o   APPENDECTOMY  03/17/2013   Procedure: INCIDENTAL APPENDECTOMY;  Surgeon: JGwenyth Ober MD;  [facility]  Service: General;;   capsule endoscopy     COLON SURGERY     COLONOSCOPY     in 2012 -    COLOSTOMY CLOSURE  03/17/2013   COLOSTOMY REVERSAL  03/17/2013   Procedure:  COLOSTOMY REVERSAL;  Surgeon: Gwenyth Ober, MD;  Location: [facility];  Service: General;;   EUS N/A 04/30/2015   Procedure: UPPER ENDOSCOPIC ULTRASOUND (EUS) LINEAR;  Surgeon: Carol Ada, MD;  [facility];  Service: Endoscopy;  Laterality: N/A;   HERNIA REPAIR     X2 2-4 y/o inguinal   LAPAROTOMY N/A 10/17/2012   Procedure: EXPLORATORY LAPAROTOMY,  COLON RESECTION AND COLOSTOMY;  Surgeon: Gwenyth Ober, MD;  Location: [facility];  Service: General;  Laterality: N/A;   LYSIS OF ADHESION  03/17/2013   Procedure: LYSIS OF ADHESION;  Surgeon: Gwenyth Ober, MD;  Location: [facility];  Service: General;;   ORIF PATELLA Left 02/20/2020   Procedure: OPEN REDUCTION INTERNAL (ORIF) FIXATION LEFT PATELLA;  Surgeon: Leandrew Koyanagi, MD;  Location: [facility];  Service: Orthopedics;  Laterality: Left;   TOTAL HIP ARTHROPLASTY Right 09/12/2021   Procedure: RIGHT TOTAL HIP ARTHROPLASTY ANTERIOR APPROACH;   Surgeon: Leandrew Koyanagi, MD;  Location: [facility];  Service: Orthopedics;  Laterality: Right;  3-C   UPPER GASTROINTESTINAL ENDOSCOPY     2013 -   Social History   Occupational History   Not on file  Tobacco Use   Smoking status: Never   Smokeless tobacco: Never   Tobacco comments:    EXPOSED TO SECOND HAND SMOKE   Vaping Use   Vaping Use: Never used  Substance and Sexual Activity   Alcohol use: Not Currently    Alcohol/week: 2.0 standard drinks of alcohol    Types: 2 Glasses of wine per week   Drug use: No   Sexual activity: Not Currently    Birth control/protection: Post-menopausal

## 2021-12-29 ENCOUNTER — Telehealth: Payer: Self-pay | Admitting: Orthopaedic Surgery

## 2021-12-29 DIAGNOSIS — Z86010 Personal history of colonic polyps: Secondary | ICD-10-CM | POA: Diagnosis not present

## 2021-12-29 DIAGNOSIS — Z800 Family history of malignant neoplasm of digestive organs: Secondary | ICD-10-CM | POA: Diagnosis not present

## 2021-12-29 DIAGNOSIS — B3789 Other sites of candidiasis: Secondary | ICD-10-CM | POA: Diagnosis not present

## 2021-12-29 NOTE — Telephone Encounter (Signed)
Pt called stating she has problems with taking amoxicillin. She states she got an infection two wks after taking and would like a call back to discuss another antibiotics. Pt phone number is [phone].

## 2021-12-30 ENCOUNTER — Encounter: Payer: Self-pay | Admitting: Orthopaedic Surgery

## 2021-12-30 ENCOUNTER — Telehealth: Payer: Self-pay | Admitting: Orthopaedic Surgery

## 2021-12-30 NOTE — Telephone Encounter (Signed)
Called patient. No answer. LMOM asking for more clarification to what she needs. Ask her to call me back.

## 2021-12-30 NOTE — Telephone Encounter (Signed)
Patient returned call would not tell me what she needed. Patient said she will send a text to Dr. Erlinda Robinson instead.

## 2022-01-17 ENCOUNTER — Telehealth: Payer: Self-pay | Admitting: *Deleted

## 2022-01-17 NOTE — Telephone Encounter (Signed)
Ortho bundle 90 day call completed. 

## 2022-02-03 ENCOUNTER — Ambulatory Visit (INDEPENDENT_AMBULATORY_CARE_PROVIDER_SITE_OTHER): Payer: Medicare Other | Admitting: Nurse Practitioner

## 2022-02-03 ENCOUNTER — Encounter: Payer: Self-pay | Admitting: Nurse Practitioner

## 2022-02-03 VITALS — BP 130/78 | HR 71 | Temp 97.5°F | Resp 98 | Ht 59.0 in | Wt 112.8 lb

## 2022-02-03 DIAGNOSIS — Z23 Encounter for immunization: Secondary | ICD-10-CM | POA: Diagnosis not present

## 2022-02-03 DIAGNOSIS — R198 Other specified symptoms and signs involving the digestive system and abdomen: Secondary | ICD-10-CM | POA: Diagnosis not present

## 2022-02-03 DIAGNOSIS — Z860100 Personal history of colon polyps, unspecified: Secondary | ICD-10-CM | POA: Insufficient documentation

## 2022-02-03 NOTE — Progress Notes (Signed)
                Established Patient Visit  Patient: Alyssa Robinson   DOB: 05-28-1950   71 y.o. Female  MRN: [id number] Visit Date: 02/03/2022  Subjective:    Chief Complaint  Patient presents with   Acute Visit    Pt states that she a stoma or hernia  left side of lower abdomen.   HPI Ms. Harston presents with LLQ abdominal fullness, she is concerned his time be related to previous colon resection surgery and/or hernia. She denies any change in GI/GU function.  Reviewed medical, surgical, and social history today  Medications: Outpatient Medications Prior to Visit  Medication Sig   fexofenadine (ALLEGRA) 180 MG tablet Take 180 mg by mouth at bedtime.   Pediatric Multiple Vit-C-FA (PEDIATRIC MULTIVITAMIN) chewable tablet Chew 1 tablet by mouth daily. [place]   Probiotic Product ([place]) 170 MG CAPS Take 1 tablet by mouth in the morning.   sodium chloride (OCEAN) 0.65 % SOLN nasal spray Place 1 spray into both nostrils as needed for congestion.   [DISCONTINUED] amoxicillin (AMOXIL) 500 MG capsule Take 4 pills one hour prior to dental work   No facility-administered medications prior to visit.   Reviewed past medical and social history.   ROS per HPI above      Objective:  BP 130/78 (BP Location: Left Arm, Patient Position: Sitting, Cuff Size: Normal)   Pulse 71   Temp (!) 97.5 F (36.4 C) (Temporal)   Resp (!) 98   Ht '4\' 11"'$  (1.499 m)   Wt 112 lb 12.8 oz (51.2 kg)   BMI 22.78 kg/m      Physical Exam Vitals reviewed.  Constitutional:      General: She is not in acute distress. Cardiovascular:     Rate and Rhythm: Normal rate.     Pulses: Normal pulses.  Pulmonary:     Effort: Pulmonary effort is normal.  Abdominal:     General: Bowel sounds are normal. There is no distension.     Palpations: Abdomen is soft. There is mass.     Tenderness: There is no abdominal tenderness. There is no right CVA tenderness, left CVA tenderness or guarding.      Hernia: No hernia is present.     Comments: Surgical scar noted in mid and LLQ of abdomen. Palpable mass in LLQ under surgical scar: non tender, no retraction, no erythema, no swelling  Neurological:     Mental Status: She is alert and oriented to person, place, and time.     No results found for any visits on 02/03/22.    Assessment & Plan:    Problem List Items Addressed This Visit   None Visit Diagnoses     Abdominal fullness in left lower quadrant    -  Primary   Relevant Orders   US PELVIS LIMITED (TRANSABDOMINAL ONLY)      Return if symptoms worsen or fail to improve.     Wilfred Lacy, NP

## 2022-02-03 NOTE — Patient Instructions (Signed)
You will be contacted to schedule appt for ABD [place]

## 2022-02-10 ENCOUNTER — Ambulatory Visit
Admission: RE | Admit: 2022-02-10 | Discharge: 2022-02-10 | Disposition: A | Discharge status: Home / Self Care | Payer: Medicare Other | Source: Ambulatory Visit | Attending: Nurse Practitioner | Admitting: Nurse Practitioner

## 2022-02-10 DIAGNOSIS — R1904 Left lower quadrant abdominal swelling, mass and lump: Secondary | ICD-10-CM | POA: Diagnosis not present

## 2022-02-10 DIAGNOSIS — R198 Other specified symptoms and signs involving the digestive system and abdomen: Secondary | ICD-10-CM

## 2022-03-10 ENCOUNTER — Ambulatory Visit (INDEPENDENT_AMBULATORY_CARE_PROVIDER_SITE_OTHER): Payer: Medicare Other

## 2022-03-10 ENCOUNTER — Ambulatory Visit (INDEPENDENT_AMBULATORY_CARE_PROVIDER_SITE_OTHER): Payer: Medicare Other | Admitting: Orthopaedic Surgery

## 2022-03-10 ENCOUNTER — Encounter: Payer: Self-pay | Admitting: Orthopaedic Surgery

## 2022-03-10 DIAGNOSIS — M25562 Pain in left knee: Secondary | ICD-10-CM

## 2022-03-10 DIAGNOSIS — Z96641 Presence of right artificial hip joint: Secondary | ICD-10-CM | POA: Diagnosis not present

## 2022-03-10 DIAGNOSIS — G8929 Other chronic pain: Secondary | ICD-10-CM

## 2022-03-10 NOTE — Progress Notes (Signed)
Office Visit Note   Patient: Alyssa Robinson           Date of Birth: [birth year]           MRN: [id number] Visit Date: 03/10/2022              Requested by: Hoyt Koch, MD [address] [place],  [place] [address] PCP: Hoyt Koch, MD   Assessment & Plan: Visit Diagnoses:  1. Status post total replacement of right hip   2. Chronic pain of left knee     Plan: In regards to the hip replacement she has done very well and very pleased.  Dental prophylaxis reinforced.  We will send doxycycline next time she needs it as the amoxicillin did cause her to get a yeast infection.  She can follow-up in another 6 months for the hip replacement.  For the knee pain I do wonder if it may be exacerbated or coming from the left hip since that is fairly degenerative.  We talked about options of putting a cortisone injection in the left hip or the knee if her symptoms become worse.  Questions encouraged and answered.  Follow-Up Instructions: Return in about 6 months (around 09/08/2022).   Orders:  Orders Placed This Encounter  Procedures   XR Pelvis 1-2 Views   XR KNEE 3 VIEW LEFT   No orders of the defined types were placed in this encounter.     Procedures: No procedures performed   Clinical Data: No additional findings.   Subjective: Chief Complaint  Patient presents with   Right Hip - Follow-up    Left total hip arthroplasty 09/12/2021   Left Knee - Pain    HPI Alyssa Robinson returns today for 24-monthvisit for right total hip replacement 09/12/2021.  She is experiencing some left knee pain as well and wants to have this checked out.  She is not started noticing the hardware more now.  This is not necessarily causing pain or discomfort.  Her activities are not limited by this.  She has been pleased with the right hip replacement.  Review of Systems   Objective: Vital Signs: There were no vitals taken for this visit.  Physical Exam  Ortho Exam Examination of  right hip shows a fully healed surgical scar.  Painless range of motion.  Normal gait and ambulation.  Examination left knee shows no joint effusion.  Fully healed surgical scar.  Prominent hardware of the patella but does not cause any tenderness or pain.  Rotation of the left hip does cause pain. Specialty Comments:  No specialty comments available.  Imaging: XR Pelvis 1-2 Views  Result Date: 03/10/2022 X-rays show stable right total hip replacement in good position without any complications.  Left hip joint reveals moderately severe osteoarthritis and significant joint space narrowing.  Superior acetabular osteophyte.  XR KNEE 3 VIEW LEFT  Result Date: 03/10/2022 Three-view x-rays of the left knee shows joint space narrowing greater than 50% of the medial compartment.  Stable plate and screw fixation of the prior patella fracture.  No acute abnormalities.    PMFS History: Patient Active Problem List   Diagnosis Date Noted   Personal history of colonic polyps 02/03/2022   Status post total replacement of right hip 09/12/2021   Primary osteoarthritis of right hip 09/11/2021   Urinary frequency 07/05/2021   Family history of early CAD 07/02/2020   Lipoma of abdominal wall 08/04/2019   Osteopenia 01/28/2019   Allergic rhinitis 01/28/2019  History of shingles 01/28/2019   Thrombocytopenia ([place]) 10/17/2017   Mass of forearm, left 11/05/2015   Hyperlipidemia with target LDL less than 130 09/14/2015   Vaginal atrophy 06/01/2015   Cyst of pancreas 03/06/2015   Glaucoma suspect of both eyes 03/11/2014   Nuclear sclerosis of both eyes 03/11/2014   Routine general medical examination at a health care facility 12/11/2012   Essential hypertension, benign 12/11/2012   Past Medical History:  Diagnosis Date   Anemia    hx   Bronchitis    Bronchitis    hx   Closed patellar sleeve fracture of left knee    Diverticulosis    GERD (gastroesophageal reflux disease)    occ    Headache(784.0)    migraines occ   Hypertension    no meds in over 60yr   Pneumonia    Seasonal allergies     Family History  Problem Relation Age of Onset   Esophageal cancer Mother        and ovarian cancer   Alcohol abuse Mother    Arthritis Mother    Heart disease Father    Alcohol abuse Father    Hypertension Father    Diabetes Father    Early death Father    Breast cancer Sister    Colon cancer Other    Pancreatic cancer Other    Stroke Neg Hx    Hyperlipidemia Neg Hx    Kidney disease Neg Hx    COPD Neg Hx     Past Surgical History:  Procedure Laterality Date   ABDOMINAL HYSTERECTOMY     71y/o   APPENDECTOMY  03/17/2013   Procedure: INCIDENTAL APPENDECTOMY;  Surgeon: JGwenyth Ober MD;  [facility]  Service: General;;   capsule endoscopy     COLON SURGERY     COLONOSCOPY     in 2012 -    COLOSTOMY CLOSURE  03/17/2013   COLOSTOMY REVERSAL  03/17/2013   Procedure: COLOSTOMY REVERSAL;  Surgeon: JGwenyth Ober MD;  [facility]  Service: General;;   EUS N/A 04/30/2015   Procedure: UPPER ENDOSCOPIC ULTRASOUND (EUS) LINEAR;  Surgeon: PCarol Ada MD;  [facility];  Service: Endoscopy;  Laterality: N/A;   HERNIA REPAIR     X2 2-4 y/o inguinal   LAPAROTOMY N/A 10/17/2012   Procedure: EXPLORATORY LAPAROTOMY,  COLON RESECTION AND COLOSTOMY;  Surgeon: JGwenyth Ober MD;  [facility]  Service: General;  Laterality: N/A;   LYSIS OF ADHESION  03/17/2013   Procedure: LYSIS OF ADHESION;  Surgeon: JGwenyth Ober MD;  [facility]  Service: General;;   ORIF PATELLA Left 02/20/2020   Procedure: OPEN REDUCTION INTERNAL (ORIF) FIXATION LEFT PATELLA;  Surgeon: XLeandrew Koyanagi MD;  [facility]  Service: Orthopedics;  Laterality: Left;   TOTAL HIP ARTHROPLASTY Right 09/12/2021   Procedure: RIGHT TOTAL HIP ARTHROPLASTY ANTERIOR APPROACH;  Surgeon: XLeandrew Koyanagi MD;  [facility]  Service: Orthopedics;  Laterality: Right;  3-C    UPPER GASTROINTESTINAL ENDOSCOPY     2013 -   Social History   Occupational History   Not on file  Tobacco Use   Smoking status: Never   Smokeless tobacco: Never   Tobacco comments:    EXPOSED TO SECOND HAND SMOKE   Vaping Use   Vaping Use: Never used  Substance and Sexual Activity   Alcohol use: Not Currently    Alcohol/week: 2.0 standard drinks of alcohol  Types: 2 Glasses of wine per week   Drug use: No   Sexual activity: Not Currently    Birth control/protection: Post-menopausal

## 2022-05-22 ENCOUNTER — Other Ambulatory Visit: Payer: Self-pay | Admitting: Gastroenterology

## 2022-05-22 DIAGNOSIS — K862 Cyst of pancreas: Secondary | ICD-10-CM

## 2022-05-25 DIAGNOSIS — L814 Other melanin hyperpigmentation: Secondary | ICD-10-CM | POA: Diagnosis not present

## 2022-05-25 DIAGNOSIS — L820 Inflamed seborrheic keratosis: Secondary | ICD-10-CM | POA: Diagnosis not present

## 2022-06-11 ENCOUNTER — Ambulatory Visit
Admission: RE | Admit: 2022-06-11 | Discharge: 2022-06-11 | Disposition: A | Discharge status: Home / Self Care | Payer: Medicare Other | Source: Ambulatory Visit | Attending: Gastroenterology | Admitting: Gastroenterology

## 2022-06-11 DIAGNOSIS — K862 Cyst of pancreas: Secondary | ICD-10-CM | POA: Diagnosis not present

## 2022-06-11 DIAGNOSIS — L290 Pruritus ani: Secondary | ICD-10-CM | POA: Diagnosis not present

## 2022-06-11 MED ORDER — GADOPICLENOL 0.5 MMOL/ML IV SOLN
5.0000 mL | Freq: Once | INTRAVENOUS | Status: AC | PRN
  Administered 2022-06-11: 5 mL via INTRAVENOUS

## 2022-06-13 ENCOUNTER — Encounter: Payer: Self-pay | Admitting: Orthopaedic Surgery

## 2022-06-13 ENCOUNTER — Ambulatory Visit (INDEPENDENT_AMBULATORY_CARE_PROVIDER_SITE_OTHER): Payer: Medicare Other | Admitting: Orthopaedic Surgery

## 2022-06-13 DIAGNOSIS — M1612 Unilateral primary osteoarthritis, left hip: Secondary | ICD-10-CM

## 2022-06-13 MED ORDER — DOXYCYCLINE HYCLATE 100 MG PO TABS: 200.0000 mg | ORAL_TABLET | Freq: Once | ORAL | 0 refills | Status: AC

## 2022-06-13 NOTE — Progress Notes (Signed)
Office Visit Note   Patient: Alyssa Robinson           Date of Birth: 1950/05/06           MRN: [id number] Visit Date: 06/13/2022              Requested by: Hoyt Koch, MD [address] [place],  [place] [address] PCP: Hoyt Koch, MD   Assessment & Plan: Visit Diagnoses:  1. Primary osteoarthritis of left hip     Plan: Impression is end-stage left hip DJD with bone-on-bone changes.  She would like to hold off until next year because she is celebrating her 72th wedding anniversary in August in [place].  She will make an appointment with Dr. Rolena Infante for intra-articular hip injection.  I will send in a prescription for doxycycline for her upcoming dentist visit.  She will follow-up with me again when she is ready to schedule surgery.  Follow-Up Instructions: No follow-ups on file.   Orders:  No orders of the defined types were placed in this encounter.  No orders of the defined types were placed in this encounter.     Procedures: No procedures performed   Clinical Data: No additional findings.   Subjective: Chief Complaint  Patient presents with   Left Hip - Pain    HPI  Alyssa Robinson returns today to follow-up for worsening left hip pain.  She has noticed that the hip pain has gotten worse and it is affecting her line dancing.   Review of Systems   Objective: Vital Signs: There were no vitals taken for this visit.  Physical Exam  Ortho Exam Examination of the left hip shows reproducible hip and groin pain with range of motion. Specialty Comments:  No specialty comments available.  Imaging: No results found.   PMFS History: Patient Active Problem List   Diagnosis Date Noted   Personal history of colonic polyps 02/03/2022   Status post total replacement of right hip 09/12/2021   Primary osteoarthritis of right hip 09/11/2021   Urinary frequency 07/05/2021   Family history of early CAD 07/02/2020   Lipoma of abdominal wall 08/04/2019    Osteopenia 01/28/2019   Allergic rhinitis 01/28/2019   History of shingles 01/28/2019   Thrombocytopenia ([place]) 10/17/2017   Mass of forearm, left 11/05/2015   Hyperlipidemia with target LDL less than 130 09/14/2015   Vaginal atrophy 06/01/2015   Cyst of pancreas 03/06/2015   Glaucoma suspect of both eyes 03/11/2014   Nuclear sclerosis of both eyes 03/11/2014   Routine general medical examination at a health care facility 12/11/2012   Essential hypertension, benign 12/11/2012   Past Medical History:  Diagnosis Date   Anemia    hx   Bronchitis    Bronchitis    hx   Closed patellar sleeve fracture of left knee    Diverticulosis    GERD (gastroesophageal reflux disease)    occ   Headache(784.0)    migraines occ   Hypertension    no meds in over 72yr   Pneumonia    Seasonal allergies     Family History  Problem Relation Age of Onset   Esophageal cancer Mother        and ovarian cancer   Alcohol abuse Mother    Arthritis Mother    Heart disease Father    Alcohol abuse Father    Hypertension Father    Diabetes Father    Early death Father    Breast cancer Sister  Colon cancer Other    Pancreatic cancer Other    Stroke Neg Hx    Hyperlipidemia Neg Hx    Kidney disease Neg Hx    COPD Neg Hx     Past Surgical History:  Procedure Laterality Date   ABDOMINAL HYSTERECTOMY     72 y/o   APPENDECTOMY  03/17/2013   Procedure: INCIDENTAL APPENDECTOMY;  Surgeon: Gwenyth Ober, MD;  Location: [facility];  Service: General;;   capsule endoscopy     COLON SURGERY     COLONOSCOPY     in 2012 -    COLOSTOMY CLOSURE  03/17/2013   COLOSTOMY REVERSAL  03/17/2013   Procedure: COLOSTOMY REVERSAL;  Surgeon: Gwenyth Ober, MD;  Location: [facility];  Service: General;;   EUS N/A 04/30/2015   Procedure: UPPER ENDOSCOPIC ULTRASOUND (EUS) LINEAR;  Surgeon: Carol Ada, MD;  [facility];  Service: Endoscopy;  Laterality: N/A;   HERNIA REPAIR     X2 2-4 y/o inguinal   LAPAROTOMY  N/A 10/17/2012   Procedure: EXPLORATORY LAPAROTOMY,  COLON RESECTION AND COLOSTOMY;  Surgeon: Gwenyth Ober, MD;  Location: [facility];  Service: General;  Laterality: N/A;   LYSIS OF ADHESION  03/17/2013   Procedure: LYSIS OF ADHESION;  Surgeon: Gwenyth Ober, MD;  Location: [facility];  Service: General;;   ORIF PATELLA Left 02/20/2020   Procedure: OPEN REDUCTION INTERNAL (ORIF) FIXATION LEFT PATELLA;  Surgeon: Leandrew Koyanagi, MD;  Location: [facility];  Service: Orthopedics;  Laterality: Left;   TOTAL HIP ARTHROPLASTY Right 09/12/2021   Procedure: RIGHT TOTAL HIP ARTHROPLASTY ANTERIOR APPROACH;  Surgeon: Leandrew Koyanagi, MD;  Location: [facility];  Service: Orthopedics;  Laterality: Right;  3-C   UPPER GASTROINTESTINAL ENDOSCOPY     2013 -   Social History   Occupational History   Not on file  Tobacco Use   Smoking status: Never   Smokeless tobacco: Never   Tobacco comments:    EXPOSED TO SECOND HAND SMOKE   Vaping Use   Vaping Use: Never used  Substance and Sexual Activity   Alcohol use: Not Currently    Alcohol/week: 2.0 standard drinks of alcohol    Types: 2 Glasses of wine per week   Drug use: No   Sexual activity: Not Currently    Birth control/protection: Post-menopausal

## 2022-06-14 DIAGNOSIS — R92333 Mammographic heterogeneous density, bilateral breasts: Secondary | ICD-10-CM | POA: Diagnosis not present

## 2022-06-14 DIAGNOSIS — Z1231 Encounter for screening mammogram for malignant neoplasm of breast: Secondary | ICD-10-CM | POA: Diagnosis not present

## 2022-06-26 ENCOUNTER — Telehealth: Payer: Self-pay

## 2022-06-26 NOTE — Telephone Encounter (Signed)
Called patient to schedule Medicare Annual Wellness Visit (AWV). Unable to reach patient.  Last date of AWV: 07/06/22  Please schedule an appointment at any time with [facility].   Norton Blizzard, [place] ([facility])  [place] [facility] [phone]

## 2022-07-07 ENCOUNTER — Ambulatory Visit (INDEPENDENT_AMBULATORY_CARE_PROVIDER_SITE_OTHER): Payer: Medicare Other | Admitting: Internal Medicine

## 2022-07-07 ENCOUNTER — Encounter: Payer: Self-pay | Admitting: Internal Medicine

## 2022-07-07 VITALS — BP 168/92 | HR 60 | Temp 98.4°F | Wt 115.2 lb

## 2022-07-07 DIAGNOSIS — M8580 Other specified disorders of bone density and structure, unspecified site: Secondary | ICD-10-CM

## 2022-07-07 DIAGNOSIS — Z0000 Encounter for general adult medical examination without abnormal findings: Secondary | ICD-10-CM

## 2022-07-07 DIAGNOSIS — I10 Essential (primary) hypertension: Secondary | ICD-10-CM | POA: Diagnosis not present

## 2022-07-07 DIAGNOSIS — D696 Thrombocytopenia, unspecified: Secondary | ICD-10-CM | POA: Diagnosis not present

## 2022-07-07 DIAGNOSIS — Z8249 Family history of ischemic heart disease and other diseases of the circulatory system: Secondary | ICD-10-CM | POA: Diagnosis not present

## 2022-07-07 DIAGNOSIS — E785 Hyperlipidemia, unspecified: Secondary | ICD-10-CM | POA: Diagnosis not present

## 2022-07-07 LAB — COMPREHENSIVE METABOLIC PANEL
ALT: 12 U/L (ref 0–35)
AST: 21 U/L (ref 0–37)
Albumin: 4.1 g/dL (ref 3.5–5.2)
Alkaline Phosphatase: 99 U/L (ref 39–117)
BUN: 16 mg/dL (ref 6–23)
CO2: 29 mEq/L (ref 19–32)
Calcium: 9.5 mg/dL (ref 8.4–10.5)
Chloride: 107 mEq/L (ref 96–112)
Creatinine, Ser: 0.71 mg/dL (ref 0.40–1.20)
GFR: 84.5 mL/min (ref >60.00)
Glucose, Bld: 96 mg/dL (ref 70–99)
Potassium: 4.9 mEq/L (ref 3.5–5.1)
Sodium: 142 mEq/L (ref 135–145)
Total Bilirubin: 0.9 mg/dL (ref 0.2–1.2)
Total Protein: 7 g/dL (ref 6.0–8.3)

## 2022-07-07 LAB — CBC
HCT: 46.3 % — ABNORMAL HIGH (ref 36.0–46.0)
Hemoglobin: 15.3 g/dL — ABNORMAL HIGH (ref 12.0–15.0)
MCHC: 33.1 g/dL (ref 30.0–36.0)
MCV: 97.6 fl (ref 78.0–100.0)
Platelets: 148 10*3/uL — ABNORMAL LOW (ref 150.0–400.0)
RBC: 4.75 Mil/uL (ref 3.87–5.11)
RDW: 14.1 % (ref 11.5–15.5)
WBC: 4.1 10*3/uL (ref 4.0–10.5)

## 2022-07-07 LAB — LIPID PANEL
Cholesterol: 205 mg/dL — ABNORMAL HIGH (ref 0–200)
HDL: 76.8 mg/dL (ref >39.00)
LDL Cholesterol: 116 mg/dL — ABNORMAL HIGH (ref 0–99)
NonHDL: 128.46
Total CHOL/HDL Ratio: 3
Triglycerides: 63 mg/dL (ref 0.0–149.0)
VLDL: 12.6 mg/dL (ref 0.0–40.0)

## 2022-07-07 NOTE — Assessment & Plan Note (Signed)
Checking lipid panel and adjust as needed. Diet controlled currently and back to exercising regularly since last year.

## 2022-07-07 NOTE — Assessment & Plan Note (Signed)
Checking CBC today for stability.

## 2022-07-07 NOTE — Assessment & Plan Note (Signed)
Due for DEXA 2025, exercises.

## 2022-07-07 NOTE — Progress Notes (Signed)
Subjective:   Patient ID: Alyssa Robinson, female    DOB: Sep 20, 1950, 72 y.o.   MRN: [id number]  HPI Here for medicare wellness and follow up, no new complaints. Please see A/P for status and treatment of chronic medical problems.   Diet: heart healthy Physical activity: active 3-4 times per week Depression/mood screen: negative Hearing: intact to whispered voice Visual acuity: grossly normal with lens, performs annual eye exam  ADLs: capable Fall risk: none Home safety: good Cognitive evaluation: intact to orientation, naming, recall and repetition EOL planning: adv directives discussed, in place  [employer] Visit from 07/07/2022 in [facility] at [place]  PHQ-2 Total Score [facility] Visit from 09/19/2016 in [facility]  PHQ-9 Total Score 1         09/12/2021    6:49 AM 09/12/2021    1:00 PM 09/12/2021    7:25 PM 02/03/2022   11:29 AM 07/07/2022    8:10 AM  [place] [place] in the past year?    0 0  Was there an injury with Fall?    0 0  Fall Risk Category Calculator    0 0  Fall Risk Category (Retired)    Low   (RETIRED) Patient Fall Risk Level Low fall risk Moderate fall risk Moderate fall risk Low fall risk   Patient at Risk for Falls Due to     No Fall Risks  Fall risk Follow up     Falls evaluation completed    I have personally reviewed and have noted 1. The patient's medical and social history - reviewed today no changes 2. Their use of alcohol, tobacco or illicit drugs 3. Their current medications and supplements 4. The patient's functional ability including ADL's, fall risks, home safety risks and hearing or visual impairment. 5. Diet and physical activities 6. Evidence for depression or mood disorders 7. Care team reviewed and updated 8.  The patient is not on an opioid pain medication.  Patient Care Team: Hoyt Koch, MD as PCP - General (Internal Medicine) Past  Medical History:  Diagnosis Date   Allergy 20-25 years ago   Anemia    hx   Arthritis 8-10 years ago   Blood transfusion without reported diagnosis 20 years ago   Bronchitis    Bronchitis    hx   Closed patellar sleeve fracture of left knee    Diverticulosis    GERD (gastroesophageal reflux disease)    occ   Headache(784.0)    migraines occ   Hypertension    no meds in over 84yrs   Pneumonia    Seasonal allergies    Past Surgical History:  Procedure Laterality Date   ABDOMINAL HYSTERECTOMY     72 y/o   APPENDECTOMY  03/17/2013   Procedure: INCIDENTAL APPENDECTOMY;  Surgeon: Gwenyth Ober, MD;  Location: [place];  Service: General;;   capsule endoscopy     COLON SURGERY     COLONOSCOPY     in 2012 -    COLOSTOMY CLOSURE  03/17/2013   COLOSTOMY REVERSAL  03/17/2013   Procedure: COLOSTOMY REVERSAL;  Surgeon: Gwenyth Ober, MD;  Location: [place];  Service: General;;   EUS N/A 04/30/2015   Procedure: UPPER ENDOSCOPIC ULTRASOUND (EUS) LINEAR;  Surgeon: Carol Ada, MD;  Location: [facility];  Service: Endoscopy;  Laterality: N/A;   HERNIA REPAIR     X2 2-4  y/o inguinal   JOINT REPLACEMENT  ORID Knee/right hip   2021/2022   LAPAROTOMY N/A 10/17/2012   Procedure: EXPLORATORY LAPAROTOMY,  COLON RESECTION AND COLOSTOMY;  Surgeon: Gwenyth Ober, MD;  Location: [place];  Service: General;  Laterality: N/A;   LYSIS OF ADHESION  03/17/2013   Procedure: LYSIS OF ADHESION;  Surgeon: Gwenyth Ober, MD;  Location: [place];  Service: General;;   ORIF PATELLA Left 02/20/2020   Procedure: OPEN REDUCTION INTERNAL (ORIF) FIXATION LEFT PATELLA;  Surgeon: Leandrew Koyanagi, MD;  Location: [place];  Service: Orthopedics;  Laterality: Left;   SMALL INTESTINE SURGERY  Perforated colon   2014   TOTAL HIP ARTHROPLASTY Right 09/12/2021   Procedure: RIGHT TOTAL HIP ARTHROPLASTY ANTERIOR APPROACH;  Surgeon: Leandrew Koyanagi, MD;  Location: [place];  Service: Orthopedics;  Laterality: Right;  3-C    UPPER GASTROINTESTINAL ENDOSCOPY     2013 -   Family History  Problem Relation Age of Onset   Esophageal cancer Mother        and ovarian cancer   Alcohol abuse Mother    Arthritis Mother    Cancer Mother    Depression Mother    Heart disease Father    Alcohol abuse Father    Hypertension Father    Diabetes Father    Early death Father    Breast cancer Sister    Colon cancer Other    Pancreatic cancer Other    Cancer Maternal Aunt    Cancer Maternal Aunt    Stroke Neg Hx    Hyperlipidemia Neg Hx    Kidney disease Neg Hx    COPD Neg Hx    Review of Systems  Constitutional: Negative.   HENT: Negative.    Eyes: Negative.   Respiratory:  Negative for cough, chest tightness and shortness of breath.   Cardiovascular:  Negative for chest pain, palpitations and leg swelling.  Gastrointestinal:  Negative for abdominal distention, abdominal pain, constipation, diarrhea, nausea and vomiting.  Musculoskeletal: Negative.   Skin: Negative.   Neurological: Negative.   Psychiatric/Behavioral: Negative.      Objective:  Physical Exam Constitutional:      Appearance: She is well-developed.  HENT:     Head: Normocephalic and atraumatic.  Cardiovascular:     Rate and Rhythm: Normal rate and regular rhythm.  Pulmonary:     Effort: Pulmonary effort is normal. No respiratory distress.     Breath sounds: Normal breath sounds. No wheezing or rales.  Abdominal:     General: Bowel sounds are normal. There is no distension.     Palpations: Abdomen is soft.     Tenderness: There is no abdominal tenderness. There is no rebound.  Musculoskeletal:     Cervical back: Normal range of motion.  Skin:    General: Skin is warm and dry.  Neurological:     Mental Status: She is alert and oriented to person, place, and time.     Coordination: Coordination normal.    Vitals:   07/07/22 0808 07/07/22 0918  BP: (!) 170/100 (!) 168/92  Pulse: 60   Temp: 98.4 F (36.9 C)   TempSrc: Oral    SpO2: 95%   Weight: 115 lb 4 oz (52.3 kg)    Assessment & Plan:

## 2022-07-07 NOTE — Assessment & Plan Note (Signed)
BP moderately elevated today initially with 130s/70s at home with BP cuff. Not on meds and we decided to maintain this with close monitoring. She will let us know if BP >140/90 consistently at home. Checking CMP for complications

## 2022-07-07 NOTE — Assessment & Plan Note (Signed)
No new chest pains and watching BP closely.

## 2022-07-07 NOTE — Assessment & Plan Note (Signed)
Flu shot yearly. Covid-19 up to date. RSV counseled. Pneumonia complete. Shingrix due at pharmacy. Tetanus due 2027. Colonoscopy due 2030. Mammogram due 2025, pap smear aged out and dexa due 2025. Counseled about sun safety and mole surveillance. Counseled about the dangers of distracted driving. Given 10 year screening recommendations.

## 2022-07-24 DIAGNOSIS — L820 Inflamed seborrheic keratosis: Secondary | ICD-10-CM | POA: Diagnosis not present

## 2022-07-24 DIAGNOSIS — L814 Other melanin hyperpigmentation: Secondary | ICD-10-CM | POA: Diagnosis not present

## 2022-09-08 ENCOUNTER — Other Ambulatory Visit (INDEPENDENT_AMBULATORY_CARE_PROVIDER_SITE_OTHER): Payer: Medicare Other

## 2022-09-08 ENCOUNTER — Telehealth: Payer: Self-pay | Admitting: *Deleted

## 2022-09-08 ENCOUNTER — Encounter: Payer: Self-pay | Admitting: Orthopaedic Surgery

## 2022-09-08 ENCOUNTER — Ambulatory Visit (INDEPENDENT_AMBULATORY_CARE_PROVIDER_SITE_OTHER): Payer: Medicare Other | Admitting: Orthopaedic Surgery

## 2022-09-08 DIAGNOSIS — Z96641 Presence of right artificial hip joint: Secondary | ICD-10-CM

## 2022-09-08 NOTE — Telephone Encounter (Signed)
1 year Ortho bundle in office visit completed.

## 2022-09-08 NOTE — Progress Notes (Signed)
Office Visit Note   Patient: Alyssa Robinson           Date of Birth: 12/23/1950           MRN: [id number] Visit Date: 09/08/2022              Requested by: Myrlene Broker, MD [address] [place],  [place] [address] PCP: Myrlene Broker, MD   Assessment & Plan: Visit Diagnoses:  1. Status post total replacement of right hip     Plan: Impression is 1 year status post right total hip replacement.  Implant looks good I think she either strained her gluteus maximus or hamstring tendon.  Recommend symptomatic treatment.  Her left hip is doing okay for now.  Follow-up in another year with standing AP pelvis x-rays.  Follow-Up Instructions: Return in about 1 year (around 09/08/2023).   Orders:  Orders Placed This Encounter  Procedures   XR Pelvis 1-2 Views   No orders of the defined types were placed in this encounter.     Procedures: No procedures performed   Clinical Data: No additional findings.   Subjective: Chief Complaint  Patient presents with   Right Hip - Follow-up    Right total hip arthroplasty 09/12/2021    HPI Skylur is a 72 year old female who is well-known to me who comes in for 1 year postop check status post right total hip replacement.  She states that yesterday she bent over to grab something and felt a stabbing pain in the buttock.  Denies any groin pain or thigh pain. Review of Systems   Objective: Vital Signs: There were no vitals taken for this visit.  Physical Exam  Ortho Exam Examination of the right hip shows fully healed surgical scar.  She has tenderness in the gluteus maximus muscle belly.  Ischial tuberosity is nontender. Specialty Comments:  No specialty comments available.  Imaging: XR Pelvis 1-2 Views  Result Date: 09/08/2022 Stable right total hip replacement without complications. Advanced degenerative joint disease with bone on bone joint space narrowing of left hip joint.    PMFS History: Patient Active  Problem List   Diagnosis Date Noted   Personal history of colonic polyps 02/03/2022   Family history of early CAD 07/02/2020   Lipoma of abdominal wall 08/04/2019   Osteopenia 01/28/2019   Allergic rhinitis 01/28/2019   Thrombocytopenia (HCC) 10/17/2017   Mass of forearm, left 11/05/2015   Hyperlipidemia with target LDL less than 130 09/14/2015   Vaginal atrophy 06/01/2015   Cyst of pancreas 03/06/2015   Glaucoma suspect of both eyes 03/11/2014   Nuclear sclerosis of both eyes 03/11/2014   Routine general medical examination at a health care facility 12/11/2012   Essential hypertension, benign 12/11/2012   Past Medical History:  Diagnosis Date   Allergy 20-25 years ago   Anemia    hx   Arthritis 8-10 years ago   Blood transfusion without reported diagnosis 20 years ago   Bronchitis    Bronchitis    hx   Closed patellar sleeve fracture of left knee    Diverticulosis    GERD (gastroesophageal reflux disease)    occ   Headache(784.0)    migraines occ   Hypertension    no meds in over 68yrs   Pneumonia    Seasonal allergies     Family History  Problem Relation Age of Onset   Esophageal cancer Mother        and ovarian cancer  Alcohol abuse Mother    Arthritis Mother    Cancer Mother    Depression Mother    Heart disease Father    Alcohol abuse Father    Hypertension Father    Diabetes Father    Early death Father    Breast cancer Sister    Colon cancer Other    Pancreatic cancer Other    Cancer Maternal Aunt    Cancer Maternal Aunt    Stroke Neg Hx    Hyperlipidemia Neg Hx    Kidney disease Neg Hx    COPD Neg Hx     Past Surgical History:  Procedure Laterality Date   ABDOMINAL HYSTERECTOMY     72 y/o   APPENDECTOMY  03/17/2013   Procedure: INCIDENTAL APPENDECTOMY;  Surgeon: Cherylynn Ridges, MD;  [facility];  Service: General;;   capsule endoscopy     COLON SURGERY     COLONOSCOPY     in 2012 -    COLOSTOMY CLOSURE  03/17/2013   COLOSTOMY REVERSAL   03/17/2013   Procedure: COLOSTOMY REVERSAL;  Surgeon: Cherylynn Ridges, MD;  Location: [facility];  Service: General;;   EUS N/A 04/30/2015   Procedure: UPPER ENDOSCOPIC ULTRASOUND (EUS) LINEAR;  Surgeon: Jeani Hawking, MD;  Location: [facility];  Service: Endoscopy;  Laterality: N/A;   HERNIA REPAIR     X2 2-4 y/o inguinal   JOINT REPLACEMENT  ORID Knee/right hip   2021/2022   LAPAROTOMY N/A 10/17/2012   Procedure: EXPLORATORY LAPAROTOMY,  COLON RESECTION AND COLOSTOMY;  Surgeon: Cherylynn Ridges, MD;  Location: [facility];  Service: General;  Laterality: N/A;   LYSIS OF ADHESION  03/17/2013   Procedure: LYSIS OF ADHESION;  Surgeon: Cherylynn Ridges, MD;  Location: [facility];  Service: General;;   ORIF PATELLA Left 02/20/2020   Procedure: OPEN REDUCTION INTERNAL (ORIF) FIXATION LEFT PATELLA;  Surgeon: Tarry Kos, MD;  Location: [facility];  Service: Orthopedics;  Laterality: Left;   SMALL INTESTINE SURGERY  Perforated colon   2014   TOTAL HIP ARTHROPLASTY Right 09/12/2021   Procedure: RIGHT TOTAL HIP ARTHROPLASTY ANTERIOR APPROACH;  Surgeon: Tarry Kos, MD;  Location: [facility];  Service: Orthopedics;  Laterality: Right;  3-C   UPPER GASTROINTESTINAL ENDOSCOPY     2013 -   Social History   Occupational History   Not on file  Tobacco Use   Smoking status: Never   Smokeless tobacco: Never   Tobacco comments:    Second hand smoke with mother/father smoking  Vaping Use   Vaping Use: Never used  Substance and Sexual Activity   Alcohol use: Not Currently    Alcohol/week: 2.0 standard drinks of alcohol    Types: 2 Glasses of wine per week   Drug use: No   Sexual activity: Yes    Birth control/protection: Post-menopausal

## 2022-09-10 DIAGNOSIS — Z20822 Contact with and (suspected) exposure to covid-19: Secondary | ICD-10-CM | POA: Diagnosis not present

## 2022-09-10 DIAGNOSIS — R051 Acute cough: Secondary | ICD-10-CM | POA: Diagnosis not present

## 2022-09-10 DIAGNOSIS — J40 Bronchitis, not specified as acute or chronic: Secondary | ICD-10-CM | POA: Diagnosis not present

## 2022-09-13 ENCOUNTER — Ambulatory Visit (INDEPENDENT_AMBULATORY_CARE_PROVIDER_SITE_OTHER): Payer: Medicare Other | Admitting: Nurse Practitioner

## 2022-09-13 VITALS — BP 122/78 | HR 81 | Temp 98.5°F | Ht 59.0 in | Wt 113.6 lb

## 2022-09-13 DIAGNOSIS — J40 Bronchitis, not specified as acute or chronic: Secondary | ICD-10-CM | POA: Diagnosis not present

## 2022-09-13 LAB — POCT RAPID STREP A (OFFICE): Rapid Strep A Screen: NEGATIVE

## 2022-09-13 MED ORDER — PROMETHAZINE-DM 6.25-15 MG/5ML PO SYRP
5.0000 mL | ORAL_SOLUTION | Freq: Every evening | ORAL | 0 refills | Status: DC | PRN
Start: 2022-09-13 — End: 2023-02-02

## 2022-09-13 NOTE — Assessment & Plan Note (Signed)
Acute No wheezing or crackles noted on exam, lung sounds clear.  Vital signs stable.  Point-of-care strep test negative today, previous COVID-19 test negative. Overall patient symptoms seem to be mild to moderate in severity. For now recommend patient complete Z-Pak as she has 1 more day, start prednisone, and take Tessalon Perles during the day as needed for cough suppression.  Patient educated to only take Tylenol if she needs something for pain or to treat fever while taking prednisone.  Also told she has stomach pain or sees any bright red blood in stool or tarry stools to stop the prednisone and reach out to provider.  Patient warned of possible side effects of prednisone including insomnia, jitteriness, increased appetite. Per shared decision making patient would like to try promethazine-dextromethorphan cough syrup at night as needed.  Patient educated to avoid driving or operating heavy machinery while taking the cough syrup.  Also educated that risk of fall may increase with this cough syrup.  She reports understanding.  Patient told to follow back up for in person evaluation if symptoms persist longer than 14 days or worsen.

## 2022-09-13 NOTE — Progress Notes (Signed)
Established Patient Office Visit  Subjective   Patient ID: Alyssa Robinson, female    DOB: 1950-07-06  Age: 72 y.o. MRN: [id number]  Chief Complaint  Patient presents with   Cough    Symptom onset 1 week ago Started with dry cough, has now progressed to slightly productive although patient is not using Mucinex.  Cough is worst first thing in the morning.  Was seen in urgent care earlier this week.  She reports she had x-ray completed which was negative for pneumonia.  She was also prescribed Z-Pak, prednisone 40 mg daily for 5 days, and Tessalon Perles.  Is having a hard time sleeping through the night due to the cough.  Has not yet started prednisone or Tessalon Perles.  She also reports she was tested for COVID-19 urgent care and was told that her results were negative    Review of Systems  Constitutional:  Negative for chills and fever.  HENT:  Positive for sore throat. Negative for sinus pain.        (+) rhinorrhea   Respiratory:  Positive for cough, sputum production and shortness of breath (with coughing fits). Negative for wheezing.   Cardiovascular:  Negative for chest pain.  Gastrointestinal:  Negative for diarrhea, nausea and vomiting.  Neurological:  Negative for headaches.      Objective:     BP 122/78   Pulse 81   Temp 98.5 F (36.9 C)   Ht 4\' 11"  (1.499 m)   Wt 113 lb 9.6 oz (51.5 kg)   SpO2 95%   BMI 22.94 kg/m    Physical Exam Vitals reviewed.  Constitutional:      General: She is not in acute distress.    Appearance: Normal appearance.  HENT:     Head: Normocephalic and atraumatic.     Mouth/Throat:     Mouth: Mucous membranes are moist.     Pharynx: Oropharynx is clear. No oropharyngeal exudate or posterior oropharyngeal erythema.  Neck:     Vascular: No carotid bruit.  Cardiovascular:     Rate and Rhythm: Normal rate and regular rhythm.     Pulses: Normal pulses.     Heart sounds: Normal heart sounds.  Pulmonary:     Effort: Pulmonary  effort is normal.     Breath sounds: Normal breath sounds.  Lymphadenopathy:     Cervical: No cervical adenopathy.  Skin:    General: Skin is warm and dry.  Neurological:     General: No focal deficit present.     Mental Status: She is alert and oriented to person, place, and time.  Psychiatric:        Mood and Affect: Mood normal.        Behavior: Behavior normal.        Judgment: Judgment normal.      Results for orders placed or performed in visit on 09/13/22  POCT rapid strep A  Result Value Ref Range   Rapid Strep A Screen Negative Negative      The 10-year ASCVD risk score (Arnett DK, et al., 2019) is: 17.5%    Assessment & Plan:   Problem List Items Addressed This Visit       Respiratory   Bronchitis - Primary    Acute No wheezing or crackles noted on exam, lung sounds clear.  Vital signs stable.  Point-of-care strep test negative today, previous COVID-19 test negative. Overall patient symptoms seem to be mild to moderate in severity. For now recommend patient  complete Z-Pak as she has 1 more day, start prednisone, and take Tessalon Perles during the day as needed for cough suppression.  Patient educated to only take Tylenol if she needs something for pain or to treat fever while taking prednisone.  Also told she has stomach pain or sees any bright red blood in stool or tarry stools to stop the prednisone and reach out to provider.  Patient warned of possible side effects of prednisone including insomnia, jitteriness, increased appetite. Per shared decision making patient would like to try promethazine-dextromethorphan cough syrup at night as needed.  Patient educated to avoid driving or operating heavy machinery while taking the cough syrup.  Also educated that risk of fall may increase with this cough syrup.  She reports understanding.  Patient told to follow back up for in person evaluation if symptoms persist longer than 14 days or worsen.      Relevant  Medications   promethazine-dextromethorphan (PROMETHAZINE-DM) 6.25-15 MG/5ML syrup   Other Relevant Orders   POCT rapid strep A (Completed)    Return if symptoms worsen or fail to improve.    Elenore Paddy, NP

## 2022-09-19 ENCOUNTER — Encounter: Payer: Self-pay | Admitting: Internal Medicine

## 2022-09-19 ENCOUNTER — Ambulatory Visit (INDEPENDENT_AMBULATORY_CARE_PROVIDER_SITE_OTHER): Payer: Medicare Other | Admitting: Internal Medicine

## 2022-09-19 VITALS — BP 132/72 | HR 80 | Temp 98.3°F | Ht 59.0 in | Wt 113.0 lb

## 2022-09-19 DIAGNOSIS — J40 Bronchitis, not specified as acute or chronic: Secondary | ICD-10-CM | POA: Diagnosis not present

## 2022-09-19 DIAGNOSIS — J22 Unspecified acute lower respiratory infection: Secondary | ICD-10-CM | POA: Diagnosis not present

## 2022-09-19 DIAGNOSIS — I10 Essential (primary) hypertension: Secondary | ICD-10-CM | POA: Diagnosis not present

## 2022-09-19 DIAGNOSIS — R062 Wheezing: Secondary | ICD-10-CM

## 2022-09-19 MED ORDER — IPRATROPIUM-ALBUTEROL 0.5-2.5 (3) MG/3ML IN SOLN
3.0000 mL | Freq: Once | RESPIRATORY_TRACT | Status: AC
Start: 2022-09-19 — End: 2022-09-19
  Administered 2022-09-19: 3 mL via RESPIRATORY_TRACT

## 2022-09-19 MED ORDER — ALBUTEROL SULFATE HFA 108 (90 BASE) MCG/ACT IN AERS: 2.0000 | INHALATION_SPRAY | Freq: Four times a day (QID) | RESPIRATORY_TRACT | 0 refills | Status: DC | PRN

## 2022-09-19 MED ORDER — AMOXICILLIN 500 MG PO CAPS: 500.0000 mg | ORAL_CAPSULE | Freq: Three times a day (TID) | ORAL | 0 refills | Status: AC

## 2022-09-19 MED ORDER — SPACER/AERO-HOLDING CHAMBERS DEVI: 0 refills | Status: AC

## 2022-09-19 NOTE — Patient Instructions (Addendum)
      You had nebulizer treatment today   Medications changes include :   amoxicillin three times a day for 7 days, albuterol inhaler     Return if symptoms worsen or fail to improve.

## 2022-09-19 NOTE — Progress Notes (Signed)
Subjective:    Patient ID: Alyssa Robinson, female    DOB: [birth year], 72 y.o.   MRN: [id number]      HPI Zaraya is here for  Chief Complaint  Patient presents with   Cough    Patient stopped Prednisone prescribed by Maralyn Sago but she d/c it because she didn't like how it made her feel    She is here for an acute visit for cold symptoms.   Her symptoms started mid May.  She was seen at urgent care about 1 week ago (5/19)..  Chest x-ray on that day was negative.  COVID test there was negative.  She was prescribed a Z-Pak, prednisone 40 mg daily for 5 days and Tessalon Perles.  She saw Maralyn Sago the end of last week.  She states that initially she had a dry cough, but it was becoming more productive.  The cough was affecting her sleep.  She had not started the prednisone or Tessalon Perles.  Rapid strep test was negative.  She was advised to complete the Z-Pak-add 1 more day left and was prescribed promethazine DM cough syrup.  She was advised to take the prednisone.   She stopped the prednisone because she didn't like how it made her feel.    She is still taking mucinex.    Symptoms now - cough --productive of yellow-white phlegm-was much more yellow previously, thick blood tinged at times.  She is wheezing.  She still has a lot of nasal congestion, postnasal drip, sore throat and fatigue.  She denies any shortness of breath or sinus pain.  She does feel like the Z-Pak helped, but it did not get rid of the infection completely which has happened to her in the past.   Medications and allergies reviewed with patient and updated if appropriate.  Current Outpatient Medications on File Prior to Visit  Medication Sig Dispense Refill   fexofenadine (ALLEGRA) 180 MG tablet Take 180 mg by mouth at bedtime.     guaiFENesin (MUCINEX) 600 MG 12 hr tablet Take by mouth.     Pediatric Multiple Vit-C-FA (PEDIATRIC MULTIVITAMIN) chewable tablet Chew 1 tablet by mouth daily. [place]     Probiotic  Product (ULTRAFLORA IMMUNE HEALTH) 170 MG CAPS Take 1 tablet by mouth in the morning.     promethazine-dextromethorphan (PROMETHAZINE-DM) 6.25-15 MG/5ML syrup Take 5 mLs by mouth at bedtime as needed for cough. 118 mL 0   sodium chloride (OCEAN) 0.65 % SOLN nasal spray Place 1 spray into both nostrils as needed for congestion.     No current facility-administered medications on file prior to visit.    Review of Systems  Constitutional:  Positive for fatigue. Negative for fever.  HENT:  Positive for congestion, postnasal drip and sore throat (mild). Negative for ear pain, sinus pressure and sinus pain.   Respiratory:  Positive for cough (productive of yellow - white, occ blood in sputum) and wheezing (mild). Negative for shortness of breath.   Neurological:  Negative for dizziness, light-headedness and headaches.       Objective:   Vitals:   09/19/22 1436  BP: 132/72  Pulse: 80  Temp: 98.3 F (36.8 C)  SpO2: 97%   BP Readings from Last 3 Encounters:  09/19/22 132/72  09/13/22 122/78  07/07/22 (!) 168/92   Wt Readings from Last 3 Encounters:  09/19/22 113 lb (51.3 kg)  09/13/22 113 lb 9.6 oz (51.5 kg)  07/07/22 115 lb 4 oz (52.3 kg)   Body mass  index is 22.82 kg/m.    Physical Exam Constitutional:      General: She is not in acute distress.    Appearance: Normal appearance. She is not ill-appearing.  HENT:     Head: Normocephalic and atraumatic.     Right Ear: Tympanic membrane, ear canal and external ear normal.     Left Ear: Tympanic membrane, ear canal and external ear normal.     Mouth/Throat:     Mouth: Mucous membranes are moist.     Pharynx: No oropharyngeal exudate or posterior oropharyngeal erythema.  Eyes:     Conjunctiva/sclera: Conjunctivae normal.  Cardiovascular:     Rate and Rhythm: Normal rate and regular rhythm.  Pulmonary:     Effort: Pulmonary effort is normal. No respiratory distress.     Breath sounds: Wheezing (diffuse) present. No rales.   Musculoskeletal:     Cervical back: Neck supple. No tenderness.  Lymphadenopathy:     Cervical: No cervical adenopathy.  Skin:    General: Skin is warm and dry.  Neurological:     Mental Status: She is alert.            Assessment & Plan:     Wheezing, lower respiratory tract infection: Persistent lower respiratory tract infection-likely partially treated with Z-Pak With diffuse wheezing on exam DuoNeb treatment today in the office Albuterol inhaler every 6 hours-spacer prescribed Amoxicillin 500 mg 3 times daily x 7 days Continue over-the-counter cold medications Call or return if no improvement

## 2022-09-29 ENCOUNTER — Telehealth: Payer: Self-pay

## 2022-09-29 NOTE — Telephone Encounter (Signed)
(  Key: [id number]) Rx #: [id number] Need Help? Call us at [phone] Outcome Additional Information Required [facility] is not able to process this request through ePA, please contact the plan at [phone] or fax in request to [phone]. NOTICE FOR OPIOID REQUESTS ONLY: Effective Apr 24, 2017 Medicare Part D plans have implemented new opioid safety edits, including a 7 day supply limit for opioid naive patients. Drug [employer] [facility] [facility] ePA cloud Psychologist, educational Medicare Electronic PA Form [phone] NCPDP) Original Claim Info A6,70,A5 NOT COV UNDER PART D LAW MAY BE COV'UND PART B'NON-FORMULARY DRUG, CONTACT PRESCRIBER(PHARMACY HELP DESK [id number])

## 2022-12-07 DIAGNOSIS — D1801 Hemangioma of skin and subcutaneous tissue: Secondary | ICD-10-CM | POA: Diagnosis not present

## 2022-12-07 DIAGNOSIS — L820 Inflamed seborrheic keratosis: Secondary | ICD-10-CM | POA: Diagnosis not present

## 2022-12-07 DIAGNOSIS — L814 Other melanin hyperpigmentation: Secondary | ICD-10-CM | POA: Diagnosis not present

## 2023-02-02 ENCOUNTER — Other Ambulatory Visit (INDEPENDENT_AMBULATORY_CARE_PROVIDER_SITE_OTHER): Payer: Medicare Other

## 2023-02-02 ENCOUNTER — Encounter: Payer: Self-pay | Admitting: Orthopaedic Surgery

## 2023-02-02 ENCOUNTER — Ambulatory Visit: Payer: Medicare Other | Admitting: Orthopaedic Surgery

## 2023-02-02 ENCOUNTER — Other Ambulatory Visit: Payer: Self-pay

## 2023-02-02 DIAGNOSIS — M1612 Unilateral primary osteoarthritis, left hip: Secondary | ICD-10-CM | POA: Diagnosis not present

## 2023-02-02 NOTE — Progress Notes (Signed)
Office Visit Note   Patient: Alyssa Robinson           Date of Birth: [birth year]           MRN: [id number] Visit Date: 02/02/2023              Requested by: Alyssa Broker, MD [address] [place],  [place] [address] PCP: Alyssa Broker, MD   Assessment & Plan: Visit Diagnoses:  1. Primary osteoarthritis of left hip     Plan: Alyssa Robinson is a 71 year old female with end-stage left hip DJD.  Her symptoms have gotten significantly worse.  She would like to meet with Alyssa Robinson today to see if her surgery can be moved up.  Follow-Up Instructions: No follow-ups on file.   Orders:  Orders Placed This Encounter  Procedures   XR HIP UNILAT W OR W/O PELVIS 2-3 VIEWS LEFT   No orders of the defined types were placed in this encounter.     Procedures: No procedures performed   Clinical Data: No additional findings.   Subjective: Chief Complaint  Patient presents with   Left Knee - Pain   Left Hip - Pain    HPI Alyssa Robinson comes in today for worsening left hip pain.  Her surgery was scheduled for mid December but due to recent worsening she would like to move the surgery up if possible.  She is having a lot of difficulty with any ADLs.  She is experiencing giving way.  Has trouble putting on shoes and socks.  She is unable to cross her legs. Review of Systems   Objective: Vital Signs: There were no vitals taken for this visit.  Physical Exam  Ortho Exam Exam of the left hip shows severe pain with any internal and external rotation.  Antalgic gait.  Positive Stinchfield sign. Specialty Comments:  No specialty comments available.  Imaging: No results found.   PMFS History: Patient Active Problem List   Diagnosis Date Noted   Bronchitis 09/13/2022   History of colonic polyps 02/03/2022   Family history of early CAD 07/02/2020   Lipoma of abdominal wall 08/04/2019   Osteopenia 01/28/2019   Allergic rhinitis 01/28/2019   Thrombocytopenia (HCC) 10/17/2017    Mass of forearm, left 11/05/2015   Hyperlipidemia with target LDL less than 130 09/14/2015   Vaginal atrophy 06/01/2015   Cyst of pancreas 03/06/2015   Glaucoma suspect of both eyes 03/11/2014   Nuclear sclerosis of both eyes 03/11/2014   Routine general medical examination at a health care facility 12/11/2012   Essential hypertension, benign 12/11/2012   Past Medical History:  Diagnosis Date   Allergy 20-25 years ago   Anemia    hx   Arthritis 8-10 years ago   Blood transfusion without reported diagnosis 20 years ago   Bronchitis    Bronchitis    hx   Closed patellar sleeve fracture of left knee    Diverticulosis    GERD (gastroesophageal reflux disease)    occ   Headache(784.0)    migraines occ   Hypertension    no meds in over 56yrs   Pneumonia    Seasonal allergies     Family History  Problem Relation Age of Onset   Esophageal cancer Mother        and ovarian cancer   Alcohol abuse Mother    Arthritis Mother    Cancer Mother    Depression Mother    Heart disease Father    Alcohol abuse  Father    Hypertension Father    Diabetes Father    Early death Father    Breast cancer Sister    Colon cancer Other    Pancreatic cancer Other    Cancer Maternal Aunt    Cancer Maternal Aunt    Stroke Neg Hx    Hyperlipidemia Neg Hx    Kidney disease Neg Hx    COPD Neg Hx     Past Surgical History:  Procedure Laterality Date   ABDOMINAL HYSTERECTOMY     72 y/o   APPENDECTOMY  03/17/2013   Procedure: INCIDENTAL APPENDECTOMY;  Surgeon: Cherylynn Ridges, MD;  Location: [facility] OR;  Service: General;;   capsule endoscopy     COLON SURGERY     COLONOSCOPY     in 2012 -    COLOSTOMY CLOSURE  03/17/2013   COLOSTOMY REVERSAL  03/17/2013   Procedure: COLOSTOMY REVERSAL;  Surgeon: Cherylynn Ridges, MD;  Location: [facility] OR;  Service: General;;   EUS N/A 04/30/2015   Procedure: UPPER ENDOSCOPIC ULTRASOUND (EUS) LINEAR;  Surgeon: Jeani Hawking, MD;  Location: [facility];  Service:  Endoscopy;  Laterality: N/A;   HERNIA REPAIR     X2 2-4 y/o inguinal   JOINT REPLACEMENT  ORID Knee/right hip   2021/2022   LAPAROTOMY N/A 10/17/2012   Procedure: EXPLORATORY LAPAROTOMY,  COLON RESECTION AND COLOSTOMY;  Surgeon: Cherylynn Ridges, MD;  Location: [facility] OR;  Service: General;  Laterality: N/A;   LYSIS OF ADHESION  03/17/2013   Procedure: LYSIS OF ADHESION;  Surgeon: Cherylynn Ridges, MD;  Location: [facility] OR;  Service: General;;   ORIF PATELLA Left 02/20/2020   Procedure: OPEN REDUCTION INTERNAL (ORIF) FIXATION LEFT PATELLA;  Surgeon: Tarry Kos, MD;  Location: [facility];  Service: Orthopedics;  Laterality: Left;   SMALL INTESTINE SURGERY  Perforated colon   2014   TOTAL HIP ARTHROPLASTY Right 09/12/2021   Procedure: RIGHT TOTAL HIP ARTHROPLASTY ANTERIOR APPROACH;  Surgeon: Tarry Kos, MD;  Location: [facility] OR;  Service: Orthopedics;  Laterality: Right;  3-C   UPPER GASTROINTESTINAL ENDOSCOPY     2013 -   Social History   Occupational History   Not on file  Tobacco Use   Smoking status: Never   Smokeless tobacco: Never   Tobacco comments:    Second hand smoke with mother/father smoking  Vaping Use   Vaping status: Never Used  Substance and Sexual Activity   Alcohol use: Not Currently    Alcohol/week: 2.0 standard drinks of alcohol    Types: 2 Glasses of wine per week   Drug use: No   Sexual activity: Yes    Birth control/protection: Post-menopausal

## 2023-02-05 ENCOUNTER — Telehealth: Payer: Self-pay | Admitting: Radiology

## 2023-02-05 NOTE — Telephone Encounter (Signed)
Patient called, states that she is taking tylenol extra strength over the counter every 6 hours, she is asking if there is something stronger that she can take till her surgery on 02/23/2023? She is also asking what Dr. Warren Danes thoughts are on the CBD drops? She states that people in her building swear by them but wanted Dr. Warren Danes opinion before she tried it. Please call her back @ [phone]

## 2023-02-05 NOTE — Telephone Encounter (Signed)
CBD drops is totally fine by me.

## 2023-02-05 NOTE — Telephone Encounter (Signed)
Notified patient.

## 2023-02-13 ENCOUNTER — Other Ambulatory Visit: Payer: Self-pay | Admitting: Physician Assistant

## 2023-02-13 MED ORDER — DOCUSATE SODIUM 100 MG PO CAPS: 100.0000 mg | ORAL_CAPSULE | Freq: Every day | ORAL | 2 refills | Status: DC | PRN

## 2023-02-13 MED ORDER — METHOCARBAMOL 750 MG PO TABS: 750.0000 mg | ORAL_TABLET | Freq: Two times a day (BID) | ORAL | 2 refills | Status: DC | PRN

## 2023-02-13 MED ORDER — ASPIRIN 81 MG PO TBEC: 81.0000 mg | DELAYED_RELEASE_TABLET | Freq: Two times a day (BID) | ORAL | 0 refills | Status: DC

## 2023-02-13 MED ORDER — ONDANSETRON HCL 4 MG PO TABS: 4.0000 mg | ORAL_TABLET | Freq: Three times a day (TID) | ORAL | 0 refills | Status: DC | PRN

## 2023-02-13 MED ORDER — HYDROCODONE-ACETAMINOPHEN 5-325 MG PO TABS: 1.0000 | ORAL_TABLET | Freq: Three times a day (TID) | ORAL | 0 refills | Status: DC | PRN

## 2023-02-14 NOTE — Pre-Procedure Instructions (Addendum)
Surgical Instructions    Your procedure is scheduled on Friday, November 1st.  Report to [facility] Main Entrance "A" at 5:30 A.M., then check in with the Admitting office.  Call this number if you have problems the morning of surgery:  [phone]  If you have any questions prior to your surgery date call [phone]: Open Monday-Friday 8am-4pm If you experience any cold or flu symptoms such as cough, fever, chills, shortness of breath, etc. between now and your scheduled surgery, please notify us at the above number.     Remember:  Do not eat after midnight the night before your surgery  You may drink clear liquids until 4:30 a.m. the morning of your surgery.   Clear liquids allowed are: Water, Non-Citrus Juices (without pulp), Carbonated Beverages, Clear Tea, Black Coffee Only (NO MILK, CREAM OR POWDERED CREAMER of any kind), and Gatorade.  Patient Instructions  The night before surgery:  No food after midnight. ONLY clear liquids after midnight   The day of surgery (if you do NOT have diabetes):  Drink ONE (1) Pre-Surgery Clear Ensure by 4:30 a.m. the morning of surgery. Drink in one sitting. Do not sip.  This drink was given to you during your hospital  pre-op appointment visit.  Nothing else to drink after completing the  Pre-Surgery Clear Ensure.         If you have questions, please contact your surgeon's office.     Take these medicines the morning of surgery with A SIP OF WATER  loratadine (CLARITIN)  meclizine (ANTIVERT)-as needed  As of today, STOP taking any Aspirin (unless otherwise instructed by your surgeon) Aleve, Naproxen, Ibuprofen, Motrin, Advil, Goody's, BC's, all herbal medications, fish oil, and all vitamins.                     Do NOT Smoke (Tobacco/Vaping) for 24 hours prior to your procedure.  If you use a CPAP at night, you may bring your mask/headgear for your overnight stay.   Contacts, glasses, piercing's, hearing aid's, dentures or  partials may not be worn into surgery, please bring cases for these belongings.    For patients admitted to the hospital, discharge time will be determined by your treatment team.   Patients discharged the day of surgery will not be allowed to drive home, and someone needs to stay with them for 24 hours.  SURGICAL WAITING ROOM VISITATION Patients having surgery or a procedure may have no more than 2 support people in the waiting area - these visitors may rotate.   Children under the age of 80 must have an adult with them who is not the patient. If the patient needs to stay at the hospital during part of their recovery, the visitor guidelines for inpatient rooms apply. Pre-op nurse will coordinate an appropriate time for 1 support person to accompany patient in pre-op.  This support person may not rotate.   Please refer to the [facility] website for the visitor guidelines for Inpatients (after your surgery is over and you are in a regular room).    Special instructions:     Pre-operative 5 CHG Bath Instructions   You can play a key role in reducing the risk of infection after surgery. Your skin needs to be as free of germs as possible. You can reduce the number of germs on your skin by washing with CHG (chlorhexidine gluconate) soap before surgery. CHG is an antiseptic soap that kills germs and continues to kill germs  even after washing.   DO NOT use if you have an allergy to chlorhexidine/CHG or antibacterial soaps. If your skin becomes reddened or irritated, stop using the CHG and notify one of our RNs at [phone].   Please shower with the CHG soap starting 4 days before surgery using the following schedule:     Please keep in mind the following:  DO NOT shave, including legs and underarms, starting the day of your first shower.   You may shave your face at any point before/day of surgery.  Place clean sheets on your bed the day you start using CHG soap. Use a clean washcloth  (not used since being washed) for each shower. DO NOT sleep with pets once you start using the CHG.   CHG Shower Instructions:  If you choose to wash your hair and private area, wash first with your normal shampoo/soap.  After you use shampoo/soap, rinse your hair and body thoroughly to remove shampoo/soap residue.  Turn the water OFF and apply about 3 tablespoons (45 ml) of CHG soap to a CLEAN washcloth.  Apply CHG soap ONLY FROM YOUR NECK DOWN TO YOUR TOES (washing for 3-5 minutes)  DO NOT use CHG soap on face, private areas, open wounds, or sores.  Pay special attention to the area where your surgery is being performed.  If you are having back surgery, having someone wash your back for you may be helpful. Wait 2 minutes after CHG soap is applied, then you may rinse off the CHG soap.  Pat dry with a clean towel  Put on clean clothes/pajamas   If you choose to wear lotion, please use ONLY the CHG-compatible lotions on the back of this paper.     Additional instructions for the day of surgery: DO NOT APPLY any lotions, deodorants, cologne, or perfumes.   Put on clean/comfortable clothes.  Brush your teeth.  Ask your nurse before applying any prescription medications to the skin. NO nail polish, toenail polish or any artificial nail coverings of any kind.       CHG Compatible Lotions   Aveeno Moisturizing lotion  Cetaphil Moisturizing Cream  Cetaphil Moisturizing Lotion  Clairol Herbal Essence Moisturizing Lotion, Dry Skin  Clairol Herbal Essence Moisturizing Lotion, Extra Dry Skin  Clairol Herbal Essence Moisturizing Lotion, Normal Skin  Curel Age Defying Therapeutic Moisturizing Lotion with Alpha Hydroxy  Curel Extreme Care Body Lotion  Curel Soothing Hands Moisturizing Hand Lotion  Curel Therapeutic Moisturizing Cream, Fragrance-Free  Curel Therapeutic Moisturizing Lotion, Fragrance-Free  Curel Therapeutic Moisturizing Lotion, Original Formula  Eucerin Daily Replenishing  Lotion  Eucerin Dry Skin Therapy Plus Alpha Hydroxy Crme  Eucerin Dry Skin Therapy Plus Alpha Hydroxy Lotion  Eucerin Original Crme  Eucerin Original Lotion  Eucerin Plus Crme Eucerin Plus Lotion  Eucerin TriLipid Replenishing Lotion  Keri Anti-Bacterial Hand Lotion  Keri Deep Conditioning Original Lotion Dry Skin Formula Softly Scented  Keri Deep Conditioning Original Lotion, Fragrance Free Sensitive Skin Formula  Keri Lotion Fast Absorbing Fragrance Free Sensitive Skin Formula  Keri Lotion Fast Absorbing Softly Scented Dry Skin Formula  Keri Original Lotion  Keri Skin Renewal Lotion Keri Silky Smooth Lotion  Keri Silky Smooth Sensitive Skin Lotion  Nivea Body Creamy Conditioning Oil  Nivea Body Extra Enriched Teacher, adult education Moisturizing Lotion Nivea Crme  Nivea Skin Firming Lotion  NutraDerm 30 Skin Lotion  NutraDerm Skin Lotion  NutraDerm Therapeutic Skin Cream  NutraDerm Therapeutic Skin Lotion  ProShield  Protective Hand Cream  Provon moisturizing lotion     Please read over the following fact sheets that you were given.    If you received a COVID test during your pre-op visit  it is requested that you wear a mask when out in public, stay away from anyone that may not be feeling well and notify your surgeon if you develop symptoms. If you have been in contact with anyone that has tested positive in the last 10 days please notify you surgeon.

## 2023-02-15 ENCOUNTER — Encounter (HOSPITAL_COMMUNITY)
Admission: RE | Admit: 2023-02-15 | Discharge: 2023-02-15 | Disposition: A | Discharge status: Home / Self Care | Payer: Medicare Other | Source: Ambulatory Visit | Attending: Orthopaedic Surgery | Admitting: Orthopaedic Surgery

## 2023-02-15 ENCOUNTER — Other Ambulatory Visit: Payer: Self-pay

## 2023-02-15 ENCOUNTER — Encounter (HOSPITAL_COMMUNITY): Payer: Self-pay

## 2023-02-15 VITALS — BP 133/71 | HR 77 | Temp 98.2°F | Resp 18 | Ht 59.0 in | Wt 111.9 lb

## 2023-02-15 DIAGNOSIS — Z01812 Encounter for preprocedural laboratory examination: Secondary | ICD-10-CM | POA: Diagnosis present

## 2023-02-15 DIAGNOSIS — Z96642 Presence of left artificial hip joint: Secondary | ICD-10-CM | POA: Diagnosis not present

## 2023-02-15 DIAGNOSIS — I10 Essential (primary) hypertension: Secondary | ICD-10-CM | POA: Insufficient documentation

## 2023-02-15 DIAGNOSIS — Z01818 Encounter for other preprocedural examination: Secondary | ICD-10-CM | POA: Diagnosis not present

## 2023-02-15 DIAGNOSIS — Z01810 Encounter for preprocedural cardiovascular examination: Secondary | ICD-10-CM | POA: Diagnosis present

## 2023-02-15 LAB — BASIC METABOLIC PANEL
Anion gap: 9 (ref 5–15)
BUN: 14 mg/dL (ref 8–23)
CO2: 27 mmol/L (ref 22–32)
Calcium: 9.2 mg/dL (ref 8.9–10.3)
Chloride: 103 mmol/L (ref 98–111)
Creatinine, Ser: 0.69 mg/dL (ref 0.44–1.00)
GFR, Estimated: >60 mL/min (ref >60)
Glucose, Bld: 104 mg/dL — ABNORMAL HIGH (ref 70–99)
Potassium: 4.4 mmol/L (ref 3.5–5.1)
Sodium: 139 mmol/L (ref 135–145)

## 2023-02-15 LAB — CBC
HCT: 41.5 % (ref 36.0–46.0)
Hemoglobin: 13.5 g/dL (ref 12.0–15.0)
MCH: 32 pg (ref 26.0–34.0)
MCHC: 32.5 g/dL (ref 30.0–36.0)
MCV: 98.3 fL (ref 80.0–100.0)
Platelets: 296 10*3/uL (ref 150–400)
RBC: 4.22 MIL/uL (ref 3.87–5.11)
RDW: 12.6 % (ref 11.5–15.5)
WBC: 6.2 10*3/uL (ref 4.0–10.5)
nRBC: 0 % (ref 0.0–0.2)

## 2023-02-15 LAB — SURGICAL PCR SCREEN
MRSA, PCR: NEGATIVE
Staphylococcus aureus: NEGATIVE

## 2023-02-15 LAB — TYPE AND SCREEN
ABO/RH(D): O POS
Antibody Screen: NEGATIVE

## 2023-02-15 NOTE — Progress Notes (Signed)
PCP - Dr. Hillard Danker Cardiologist - denies  PPM/ICD - n/a  Chest x-ray - n/a EKG - 02/15/23 Stress Test - 2016 ECHO - denies Cardiac Cath - denies  Sleep Study - denies CPAP - denies  Blood Thinner Instructions: n/a Aspirin Instructions: n/a  ERAS Protcol -Clear liquids until 0430 DOS PRE-SURGERY Ensure or G2- (1) ensure provided  COVID TEST- n/a   Anesthesia review: Yes, EKG review  Patient denies shortness of breath, fever, cough and chest pain at PAT appointment   All instructions explained to the patient, with a verbal understanding of the material. Patient agrees to go over the instructions while at home for a better understanding. Patient also instructed to self quarantine after being tested for COVID-19. The opportunity to ask questions was provided.

## 2023-02-23 ENCOUNTER — Ambulatory Visit (HOSPITAL_COMMUNITY): Payer: Medicare Other | Admitting: Anesthesiology

## 2023-02-23 ENCOUNTER — Ambulatory Visit (HOSPITAL_COMMUNITY): Payer: Medicare Other | Admitting: Physician Assistant

## 2023-02-23 ENCOUNTER — Observation Stay (HOSPITAL_COMMUNITY): Payer: Medicare Other

## 2023-02-23 ENCOUNTER — Other Ambulatory Visit: Payer: Self-pay

## 2023-02-23 ENCOUNTER — Encounter (HOSPITAL_COMMUNITY)
Admission: RE | Disposition: A | Discharge status: Home / Self Care | Payer: Self-pay | Source: Home / Self Care | Attending: Orthopaedic Surgery

## 2023-02-23 ENCOUNTER — Ambulatory Visit (HOSPITAL_COMMUNITY): Payer: Medicare Other

## 2023-02-23 ENCOUNTER — Observation Stay (HOSPITAL_COMMUNITY)
Admission: RE | Admit: 2023-02-23 | Discharge: 2023-02-24 | Disposition: A | Discharge status: Home / Self Care | Payer: Medicare Other | Attending: Orthopaedic Surgery | Admitting: Orthopaedic Surgery

## 2023-02-23 ENCOUNTER — Encounter (HOSPITAL_COMMUNITY): Payer: Self-pay | Admitting: Orthopaedic Surgery

## 2023-02-23 DIAGNOSIS — M1612 Unilateral primary osteoarthritis, left hip: Secondary | ICD-10-CM | POA: Diagnosis not present

## 2023-02-23 DIAGNOSIS — Z79899 Other long term (current) drug therapy: Secondary | ICD-10-CM | POA: Insufficient documentation

## 2023-02-23 DIAGNOSIS — Z96641 Presence of right artificial hip joint: Secondary | ICD-10-CM | POA: Insufficient documentation

## 2023-02-23 DIAGNOSIS — Z96642 Presence of left artificial hip joint: Principal | ICD-10-CM

## 2023-02-23 DIAGNOSIS — I10 Essential (primary) hypertension: Secondary | ICD-10-CM | POA: Diagnosis not present

## 2023-02-23 DIAGNOSIS — Z96643 Presence of artificial hip joint, bilateral: Secondary | ICD-10-CM | POA: Diagnosis not present

## 2023-02-23 DIAGNOSIS — Z7982 Long term (current) use of aspirin: Secondary | ICD-10-CM | POA: Insufficient documentation

## 2023-02-23 HISTORY — PX: TOTAL HIP ARTHROPLASTY: SHX124

## 2023-02-23 SURGERY — ARTHROPLASTY, HIP, TOTAL, ANTERIOR APPROACH
Anesthesia: Spinal | Site: Hip | Laterality: Left

## 2023-02-23 MED ORDER — VANCOMYCIN HCL 1 G IV SOLR
INTRAVENOUS | Status: DC | PRN
  Administered 2023-02-23: 1000 mg via TOPICAL

## 2023-02-23 MED ORDER — PROPOFOL 10 MG/ML IV BOLUS
INTRAVENOUS | Status: DC | PRN
  Administered 2023-02-23: 30 mg via INTRAVENOUS

## 2023-02-23 MED ORDER — ONDANSETRON HCL 4 MG/2ML IJ SOLN
INTRAMUSCULAR | Status: DC | PRN
  Administered 2023-02-23: 4 mg via INTRAVENOUS

## 2023-02-23 MED ORDER — ONDANSETRON HCL 4 MG/2ML IJ SOLN
INTRAMUSCULAR | Status: AC
  Filled 2023-02-23: qty 2

## 2023-02-23 MED ORDER — CEFAZOLIN SODIUM-DEXTROSE 2-4 GM/100ML-% IV SOLN
2.0000 g | INTRAVENOUS | Status: AC
  Administered 2023-02-23: 2 g via INTRAVENOUS
  Filled 2023-02-23: qty 100

## 2023-02-23 MED ORDER — PROPOFOL 10 MG/ML IV BOLUS
INTRAVENOUS | Status: AC
  Filled 2023-02-23: qty 20

## 2023-02-23 MED ORDER — ALUM & MAG HYDROXIDE-SIMETH 200-200-20 MG/5ML PO SUSP: 30.0000 mL | ORAL | Status: DC | PRN

## 2023-02-23 MED ORDER — PROPOFOL 500 MG/50ML IV EMUL
INTRAVENOUS | Status: DC | PRN
  Administered 2023-02-23: 100 ug/kg/min via INTRAVENOUS

## 2023-02-23 MED ORDER — CEFAZOLIN SODIUM-DEXTROSE 2-4 GM/100ML-% IV SOLN
2.0000 g | Freq: Four times a day (QID) | INTRAVENOUS | Status: AC
  Administered 2023-02-23 (×2): 2 g via INTRAVENOUS
  Filled 2023-02-23 (×2): qty 100

## 2023-02-23 MED ORDER — ONDANSETRON HCL 4 MG PO TABS
4.0000 mg | ORAL_TABLET | Freq: Four times a day (QID) | ORAL | Status: DC | PRN
  Administered 2023-02-23: 4 mg via ORAL
  Filled 2023-02-23: qty 1

## 2023-02-23 MED ORDER — FERROUS SULFATE 325 (65 FE) MG PO TABS
325.0000 mg | ORAL_TABLET | Freq: Three times a day (TID) | ORAL | Status: DC
  Administered 2023-02-24: 325 mg via ORAL
  Filled 2023-02-23: qty 1

## 2023-02-23 MED ORDER — TRANEXAMIC ACID-NACL 1000-0.7 MG/100ML-% IV SOLN
1000.0000 mg | INTRAVENOUS | Status: AC
  Administered 2023-02-23: 1000 mg via INTRAVENOUS
  Filled 2023-02-23: qty 100

## 2023-02-23 MED ORDER — LIDOCAINE 2% (20 MG/ML) 5 ML SYRINGE
INTRAMUSCULAR | Status: DC | PRN
  Administered 2023-02-23: 60 mg via INTRAVENOUS

## 2023-02-23 MED ORDER — DEXAMETHASONE SODIUM PHOSPHATE 10 MG/ML IJ SOLN: INTRAMUSCULAR | Status: DC | PRN

## 2023-02-23 MED ORDER — TRANEXAMIC ACID-NACL 1000-0.7 MG/100ML-% IV SOLN
1000.0000 mg | Freq: Once | INTRAVENOUS | Status: AC
  Administered 2023-02-23: 1000 mg via INTRAVENOUS
  Filled 2023-02-23: qty 100

## 2023-02-23 MED ORDER — MENTHOL 3 MG MT LOZG: 1.0000 | LOZENGE | OROMUCOSAL | Status: DC | PRN

## 2023-02-23 MED ORDER — FENTANYL CITRATE (PF) 100 MCG/2ML IJ SOLN
25.0000 ug | INTRAMUSCULAR | Status: DC | PRN
Start: 2023-02-23 — End: 2023-02-23
  Administered 2023-02-23: 25 ug via INTRAVENOUS

## 2023-02-23 MED ORDER — DEXAMETHASONE SODIUM PHOSPHATE 10 MG/ML IJ SOLN
10.0000 mg | Freq: Once | INTRAMUSCULAR | Status: AC
  Administered 2023-02-24: 10 mg via INTRAVENOUS
  Filled 2023-02-23: qty 1

## 2023-02-23 MED ORDER — CHLORHEXIDINE GLUCONATE 0.12 % MT SOLN
15.0000 mL | Freq: Once | OROMUCOSAL | Status: AC
  Administered 2023-02-23: 15 mL via OROMUCOSAL
  Filled 2023-02-23: qty 15

## 2023-02-23 MED ORDER — METOCLOPRAMIDE HCL 5 MG/ML IJ SOLN: 5.0000 mg | Freq: Three times a day (TID) | INTRAMUSCULAR | Status: DC | PRN

## 2023-02-23 MED ORDER — LACTATED RINGERS IV SOLN: INTRAVENOUS | Status: AC

## 2023-02-23 MED ORDER — ORAL CARE MOUTH RINSE: 15.0000 mL | Freq: Once | OROMUCOSAL | Status: AC

## 2023-02-23 MED ORDER — OXYCODONE HCL 5 MG PO TABS: 5.0000 mg | ORAL_TABLET | ORAL | Status: DC | PRN

## 2023-02-23 MED ORDER — ROCURONIUM BROMIDE 10 MG/ML (PF) SYRINGE
PREFILLED_SYRINGE | INTRAVENOUS | Status: AC
  Filled 2023-02-23: qty 10

## 2023-02-23 MED ORDER — OXYCODONE HCL 5 MG PO TABS: 10.0000 mg | ORAL_TABLET | ORAL | Status: DC | PRN

## 2023-02-23 MED ORDER — ASPIRIN 81 MG PO CHEW
81.0000 mg | CHEWABLE_TABLET | Freq: Two times a day (BID) | ORAL | Status: DC
  Administered 2023-02-23 – 2023-02-24 (×2): 81 mg via ORAL
  Filled 2023-02-23 (×2): qty 1

## 2023-02-23 MED ORDER — HYDROMORPHONE HCL 1 MG/ML IJ SOLN: 0.5000 mg | INTRAMUSCULAR | Status: DC | PRN

## 2023-02-23 MED ORDER — MAGNESIUM CITRATE PO SOLN: 1.0000 | Freq: Once | ORAL | Status: DC | PRN

## 2023-02-23 MED ORDER — PHENOL 1.4 % MT LIQD: 1.0000 | OROMUCOSAL | Status: DC | PRN

## 2023-02-23 MED ORDER — BUPIVACAINE-MELOXICAM ER 400-12 MG/14ML IJ SOLN
INTRAMUSCULAR | Status: AC
  Filled 2023-02-23: qty 1

## 2023-02-23 MED ORDER — ACETAMINOPHEN 500 MG PO TABS
ORAL_TABLET | ORAL | Status: AC
  Filled 2023-02-23: qty 2

## 2023-02-23 MED ORDER — ACETAMINOPHEN 325 MG PO TABS: 325.0000 mg | ORAL_TABLET | Freq: Four times a day (QID) | ORAL | Status: DC | PRN

## 2023-02-23 MED ORDER — ACETAMINOPHEN 500 MG PO TABS
1000.0000 mg | ORAL_TABLET | Freq: Once | ORAL | Status: AC
  Administered 2023-02-23: 1000 mg via ORAL

## 2023-02-23 MED ORDER — POVIDONE-IODINE 10 % EX SWAB
2.0000 | Freq: Once | CUTANEOUS | Status: AC
  Administered 2023-02-23: 2 via TOPICAL

## 2023-02-23 MED ORDER — LACTATED RINGERS IV SOLN: INTRAVENOUS | Status: DC

## 2023-02-23 MED ORDER — FENTANYL CITRATE (PF) 100 MCG/2ML IJ SOLN
INTRAMUSCULAR | Status: AC
  Filled 2023-02-23: qty 2

## 2023-02-23 MED ORDER — ONDANSETRON HCL 4 MG/2ML IJ SOLN: 4.0000 mg | Freq: Four times a day (QID) | INTRAMUSCULAR | Status: DC | PRN

## 2023-02-23 MED ORDER — BUPIVACAINE-MELOXICAM ER 400-12 MG/14ML IJ SOLN
INTRAMUSCULAR | Status: DC | PRN
  Administered 2023-02-23: 400 mg

## 2023-02-23 MED ORDER — METHOCARBAMOL 500 MG PO TABS
500.0000 mg | ORAL_TABLET | Freq: Four times a day (QID) | ORAL | Status: DC | PRN
  Administered 2023-02-23 – 2023-02-24 (×3): 500 mg via ORAL
  Filled 2023-02-23 (×3): qty 1

## 2023-02-23 MED ORDER — VANCOMYCIN HCL 1000 MG IV SOLR
INTRAVENOUS | Status: AC
  Filled 2023-02-23: qty 20

## 2023-02-23 MED ORDER — SODIUM CHLORIDE 0.9 % IR SOLN
Status: DC | PRN
  Administered 2023-02-23: 1000 mL

## 2023-02-23 MED ORDER — DOCUSATE SODIUM 100 MG PO CAPS
100.0000 mg | ORAL_CAPSULE | Freq: Two times a day (BID) | ORAL | Status: DC
  Administered 2023-02-23 – 2023-02-24 (×3): 100 mg via ORAL
  Filled 2023-02-23 (×3): qty 1

## 2023-02-23 MED ORDER — PROPOFOL 1000 MG/100ML IV EMUL
INTRAVENOUS | Status: AC
  Filled 2023-02-23: qty 100

## 2023-02-23 MED ORDER — TRANEXAMIC ACID 1000 MG/10ML IV SOLN
2000.0000 mg | INTRAVENOUS | Status: DC
  Filled 2023-02-23: qty 20

## 2023-02-23 MED ORDER — PHENYLEPHRINE 80 MCG/ML (10ML) SYRINGE FOR IV PUSH (FOR BLOOD PRESSURE SUPPORT)
PREFILLED_SYRINGE | INTRAVENOUS | Status: AC
  Filled 2023-02-23: qty 10

## 2023-02-23 MED ORDER — HYDROXYZINE HCL 50 MG/ML IM SOLN: 50.0000 mg | Freq: Four times a day (QID) | INTRAMUSCULAR | Status: DC | PRN

## 2023-02-23 MED ORDER — ONDANSETRON HCL 4 MG/2ML IJ SOLN: 4.0000 mg | Freq: Once | INTRAMUSCULAR | Status: DC | PRN

## 2023-02-23 MED ORDER — DEXAMETHASONE SODIUM PHOSPHATE 10 MG/ML IJ SOLN
INTRAMUSCULAR | Status: AC
  Filled 2023-02-23: qty 1

## 2023-02-23 MED ORDER — ACETAMINOPHEN 500 MG PO TABS
1000.0000 mg | ORAL_TABLET | Freq: Four times a day (QID) | ORAL | Status: AC
  Administered 2023-02-23 – 2023-02-24 (×2): 500 mg via ORAL
  Filled 2023-02-23 (×2): qty 2

## 2023-02-23 MED ORDER — DIPHENHYDRAMINE HCL 12.5 MG/5ML PO ELIX: 25.0000 mg | ORAL_SOLUTION | ORAL | Status: DC | PRN

## 2023-02-23 MED ORDER — METOCLOPRAMIDE HCL 5 MG PO TABS: 5.0000 mg | ORAL_TABLET | Freq: Three times a day (TID) | ORAL | Status: DC | PRN

## 2023-02-23 MED ORDER — HYDROCODONE-ACETAMINOPHEN 5-325 MG PO TABS
1.0000 | ORAL_TABLET | ORAL | Status: DC | PRN
  Administered 2023-02-23: 2 via ORAL
  Administered 2023-02-23: 1 via ORAL
  Administered 2023-02-23: 2 via ORAL
  Administered 2023-02-24: 1 via ORAL
  Filled 2023-02-23: qty 2
  Filled 2023-02-23: qty 1
  Filled 2023-02-23: qty 2
  Filled 2023-02-23: qty 1

## 2023-02-23 MED ORDER — PANTOPRAZOLE SODIUM 40 MG PO TBEC
40.0000 mg | DELAYED_RELEASE_TABLET | Freq: Every day | ORAL | Status: DC
  Administered 2023-02-23 – 2023-02-24 (×2): 40 mg via ORAL
  Filled 2023-02-23 (×2): qty 1

## 2023-02-23 MED ORDER — PHENYLEPHRINE HCL-NACL 20-0.9 MG/250ML-% IV SOLN
INTRAVENOUS | Status: DC | PRN
  Administered 2023-02-23: 20 ug/min via INTRAVENOUS

## 2023-02-23 MED ORDER — POLYETHYLENE GLYCOL 3350 17 G PO PACK
17.0000 g | PACK | Freq: Every day | ORAL | Status: DC
  Administered 2023-02-23 – 2023-02-24 (×2): 17 g via ORAL
  Filled 2023-02-23 (×2): qty 1

## 2023-02-23 MED ORDER — METHOCARBAMOL 1000 MG/10ML IJ SOLN: 500.0000 mg | Freq: Four times a day (QID) | INTRAMUSCULAR | Status: DC | PRN

## 2023-02-23 MED ORDER — SORBITOL 70 % SOLN: 30.0000 mL | Freq: Every day | Status: DC | PRN

## 2023-02-23 MED ORDER — PRONTOSAN WOUND IRRIGATION OPTIME
TOPICAL | Status: DC | PRN
  Administered 2023-02-23: 350 mL via TOPICAL

## 2023-02-23 MED ORDER — LIDOCAINE 2% (20 MG/ML) 5 ML SYRINGE
INTRAMUSCULAR | Status: AC
  Filled 2023-02-23: qty 5

## 2023-02-23 MED ORDER — TRANEXAMIC ACID 1000 MG/10ML IV SOLN
INTRAVENOUS | Status: DC | PRN
  Administered 2023-02-23: 2000 mg via TOPICAL

## 2023-02-23 SURGICAL SUPPLY — 66 items
ADH SKN CLS LQ APL DERMABOND (GAUZE/BANDAGES/DRESSINGS) ×1
BAG COUNTER SPONGE SURGICOUNT (BAG) ×1 IMPLANT
BAG DECANTER FOR FLEXI CONT (MISCELLANEOUS) ×1 IMPLANT
BAG SPNG CNTER NS LX DISP (BAG) ×1
BLADE SAG 18X100X1.27 (BLADE) ×1 IMPLANT
COVER PERINEAL POST (MISCELLANEOUS) ×1 IMPLANT
COVER SURGICAL LIGHT HANDLE (MISCELLANEOUS) ×1 IMPLANT
CUP SECTOR GRIPTON 50MM (Cup) IMPLANT
DERMABOND ADVANCED .7 DNX6 (GAUZE/BANDAGES/DRESSINGS) IMPLANT
DRAPE C-ARM 42X72 X-RAY (DRAPES) ×1 IMPLANT
DRAPE POUCH INSTRU U-SHP 10X18 (DRAPES) ×1 IMPLANT
DRAPE STERI IOBAN 125X83 (DRAPES) ×1 IMPLANT
DRAPE U-SHAPE 47X51 STRL (DRAPES) ×2 IMPLANT
DRESSING AQUACEL AG SP 3.5X10 (GAUZE/BANDAGES/DRESSINGS) IMPLANT
DRSG AQUACEL AG ADV 3.5X10 (GAUZE/BANDAGES/DRESSINGS) ×1 IMPLANT
DRSG AQUACEL AG SP 3.5X10 (GAUZE/BANDAGES/DRESSINGS) ×1
DURAPREP 26ML APPLICATOR (WOUND CARE) ×2 IMPLANT
ELECT BLADE 4.0 EZ CLEAN MEGAD (MISCELLANEOUS) ×1
ELECT REM PT RETURN 9FT ADLT (ELECTROSURGICAL) ×1
ELECTRODE BLDE 4.0 EZ CLN MEGD (MISCELLANEOUS) ×1 IMPLANT
ELECTRODE REM PT RTRN 9FT ADLT (ELECTROSURGICAL) ×1 IMPLANT
GLOVE BIOGEL PI IND STRL 7.0 (GLOVE) ×2 IMPLANT
GLOVE BIOGEL PI IND STRL 7.5 (GLOVE) ×5 IMPLANT
GLOVE ECLIPSE 7.0 STRL STRAW (GLOVE) ×2 IMPLANT
GLOVE SKINSENSE STRL SZ7.5 (GLOVE) ×1 IMPLANT
GLOVE SURG SYN 7.5 E (GLOVE) ×2 IMPLANT
GLOVE SURG SYN 7.5 PF PI (GLOVE) ×2 IMPLANT
GLOVE SURG UNDER POLY LF SZ7 (GLOVE) ×3 IMPLANT
GLOVE SURG UNDER POLY LF SZ7.5 (GLOVE) ×2 IMPLANT
GOWN STRL REUS W/ TWL LRG LVL3 (GOWN DISPOSABLE) IMPLANT
GOWN STRL REUS W/ TWL XL LVL3 (GOWN DISPOSABLE) ×1 IMPLANT
GOWN STRL REUS W/TWL LRG LVL3 (GOWN DISPOSABLE) ×1
GOWN STRL REUS W/TWL XL LVL3 (GOWN DISPOSABLE) ×1
GOWN STRL SURGICAL XL XLNG (GOWN DISPOSABLE) ×1 IMPLANT
GOWN TOGA ZIPPER T7+ PEEL AWAY (MISCELLANEOUS) ×2 IMPLANT
HANDPIECE INTERPULSE COAX TIP (DISPOSABLE) ×1
HEAD FEMORAL 32 CERAMIC (Hips) IMPLANT
HOOD PEEL AWAY T7 (MISCELLANEOUS) ×1 IMPLANT
IV NS IRRIG 3000ML ARTHROMATIC (IV SOLUTION) ×1 IMPLANT
KIT BASIN OR (CUSTOM PROCEDURE TRAY) ×1 IMPLANT
LINER ACET PNNCL PLUS4 NEUTRAL (Hips) IMPLANT
MARKER SKIN DUAL TIP RULER LAB (MISCELLANEOUS) ×1 IMPLANT
NDL SPNL 18GX3.5 QUINCKE PK (NEEDLE) ×1 IMPLANT
NEEDLE SPNL 18GX3.5 QUINCKE PK (NEEDLE) ×1 IMPLANT
PACK TOTAL JOINT (CUSTOM PROCEDURE TRAY) ×1 IMPLANT
PACK UNIVERSAL I (CUSTOM PROCEDURE TRAY) ×1 IMPLANT
PINNACLE PLUS 4 NEUTRAL (Hips) ×1 IMPLANT
SCREW 6.5MMX25MM (Screw) IMPLANT
SET HNDPC FAN SPRY TIP SCT (DISPOSABLE) ×1 IMPLANT
SOLUTION PRONTOSAN WOUND 350ML (IRRIGATION / IRRIGATOR) ×1 IMPLANT
STAPLER VISISTAT 35W (STAPLE) IMPLANT
STEM FEMORAL SZ 5MM STD ACTIS (Stem) IMPLANT
SUT ETHIBOND 2 V 37 (SUTURE) ×1 IMPLANT
SUT VIC AB 0 CT1 27 (SUTURE) ×1
SUT VIC AB 0 CT1 27XBRD ANBCTR (SUTURE) ×1 IMPLANT
SUT VIC AB 1 CTX 36 (SUTURE) ×1
SUT VIC AB 1 CTX36XBRD ANBCTR (SUTURE) ×1 IMPLANT
SUT VIC AB 2-0 CT1 27 (SUTURE) ×2
SUT VIC AB 2-0 CT1 TAPERPNT 27 (SUTURE) ×2 IMPLANT
SYR 50ML LL SCALE MARK (SYRINGE) ×1 IMPLANT
TOWEL GREEN STERILE (TOWEL DISPOSABLE) ×1 IMPLANT
TRAY CATH INTERMITTENT SS 16FR (CATHETERS) IMPLANT
TRAY FOLEY W/BAG SLVR 16FR (SET/KITS/TRAYS/PACK)
TRAY FOLEY W/BAG SLVR 16FR ST (SET/KITS/TRAYS/PACK) IMPLANT
TUBE SUCT ARGYLE STRL (TUBING) ×1 IMPLANT
YANKAUER SUCT BULB TIP NO VENT (SUCTIONS) ×1 IMPLANT

## 2023-02-23 NOTE — Anesthesia Postprocedure Evaluation (Signed)
Anesthesia Post Note  Patient: Alyssa Robinson  Procedure(s) Performed: LEFT TOTAL HIP ARTHROPLASTY ANTERIOR APPROACH (Left: Hip)     Patient location during evaluation: PACU Anesthesia Type: Spinal Level of consciousness: awake, awake and alert and oriented Pain management: pain level controlled Vital Signs Assessment: post-procedure vital signs reviewed and stable Respiratory status: spontaneous breathing, nonlabored ventilation and respiratory function stable Cardiovascular status: blood pressure returned to baseline and stable Postop Assessment: no headache, no backache, spinal receding and no apparent nausea or vomiting Anesthetic complications: no   There were no known notable events for this encounter.  Last Vitals:  Vitals:   02/23/23 1145 02/23/23 1210  BP: (!) 147/74 (!) 156/76  Pulse: 60 63  Resp: 10 20  Temp:  36.5 C  SpO2:  100%    Last Pain:  Vitals:   02/23/23 1257  TempSrc:   PainSc: 7                  Collene Schlichter

## 2023-02-23 NOTE — Discharge Instructions (Signed)

## 2023-02-23 NOTE — Evaluation (Signed)
Physical Therapy Evaluation Patient Details Name: Alyssa Robinson MRN: [id number] DOB: January 03, 1951 Today's Date: 02/23/2023  History of Present Illness  Pt is a 72 y.o. female who presented 02/23/23 for elective L anterior approach THA. PMH: R THA 2023, anemia, GERD, HTN   Clinical Impression  Pt presents with condition above and deficits mentioned below, see PT Problem List. PTA, she was independent without DME (until L hip pain began). She resides with her husband in a 1-level apartment on the top floor of an ILF with an elevator access. She is currently limited in L hip strength, ROM, and sensation following her THA earlier today. Upon sitting up, pt did have x1 bout of emesis but her BP remained stable. She believes it was the meds that made her sick. RN aware. Pt was able to perform all functional mobility, including ambulating up to ~90 ft with a RW at a CGA level without LOB today. She needs extra time due to pain and L hip stiffness. Follow physician's recommendations for discharge plan and follow up therapies. Will continue to follow acutely.        If plan is discharge home, recommend the following: A little help with bathing/dressing/bathroom;Assistance with cooking/housework;Assist for transportation   Can travel by private vehicle        Equipment Recommendations None recommended by PT  Recommendations for Other Services       Functional Status Assessment Patient has had a recent decline in their functional status and demonstrates the ability to make significant improvements in function in a reasonable and predictable amount of time.     Precautions / Restrictions Precautions Precautions: Fall Restrictions Weight Bearing Restrictions: Yes LLE Weight Bearing: Weight bearing as tolerated Other Position/Activity Restrictions: no hip precautions      Mobility  Bed Mobility Overal bed mobility: Needs Assistance Bed Mobility: Supine to Sit, Sit to Supine     Supine to  sit: Contact guard, HOB elevated, Used rails Sit to supine: Contact guard assist, HOB elevated, Used rails   General bed mobility comments: Extra time and cues provided for pt to hook L leg with her R as needed to assist it on/off the bed. She used her arms and R leg to bring the L leg off the bed and then just her R leg to lift it back onto the bed. CGA for safety    Transfers Overall transfer level: Needs assistance Equipment used: Rolling walker (2 wheels) Transfers: Sit to/from Stand Sit to Stand: Contact guard assist           General transfer comment: Extra time but no LOB, CGA for safety    Ambulation/Gait Ambulation/Gait assistance: Contact guard assist Gait Distance (Feet): 90 Feet Assistive device: Rolling walker (2 wheels) Gait Pattern/deviations: Step-through pattern, Decreased stance time - left, Decreased stride length, Decreased weight shift to left, Antalgic, Trunk flexed Gait velocity: reduced Gait velocity interpretation: <1.31 ft/sec, indicative of household ambulator   General Gait Details: Pt ambulates with a slow, antalgic gait pattern with decreased L weight shift and stance time due to L hip pain. No LOB, CGA for safety. VCs provided for upright posture.  Stairs            Wheelchair Mobility     Tilt Bed    Modified Rankin (Stroke Patients Only)       Balance Overall balance assessment: Needs assistance Sitting-balance support: No upper extremity supported, Feet supported Sitting balance-Leahy Scale: Good Sitting balance - Comments: able to sit EOB  supervision for safety   Standing balance support: Bilateral upper extremity supported, During functional activity, Reliant on assistive device for balance Standing balance-Leahy Scale: Poor Standing balance comment: Reliant on RW                             Pertinent Vitals/Pain Pain Assessment Pain Assessment: Faces Faces Pain Scale: Hurts even more Pain Location: L  hip Pain Descriptors / Indicators: Discomfort, Grimacing, Operative site guarding, Moaning Pain Intervention(s): Limited activity within patient's tolerance, Monitored during session, Premedicated before session, Repositioned, Ice applied    Home Living Family/patient expects to be discharged to:: Assisted living (ILF - [place]) Living Arrangements: Spouse/significant other               Home Equipment: Agricultural consultant (2 wheels);Rollator (4 wheels);Cane - single point;Tub bench;Grab bars - tub/shower;Hand held shower head;BSC/3in1 Additional Comments: lives on top floor, 1-level apartment with elevator access; tub/shower combo with handicap height toilet    Prior Function Prior Level of Function : Independent/Modified Independent             Mobility Comments: Prior to L hip hurting, she was independent without AD, Then she recently began to use SPC then RW for pain management       Extremity/Trunk Assessment   Upper Extremity Assessment Upper Extremity Assessment: Overall WFL for tasks assessed    Lower Extremity Assessment Lower Extremity Assessment: LLE deficits/detail LLE Deficits / Details: unable to lift hip into flexion against gravity in sitting or supine due to pain s/p THA today, but able to flex hip to step when ambulating, able to actively abduct/adduct leg, reports mild numbness at incision site LLE Sensation: decreased light touch    Cervical / Trunk Assessment Cervical / Trunk Assessment: Normal  Communication   Communication Communication: No apparent difficulties  Cognition Arousal: Alert Behavior During Therapy: WFL for tasks assessed/performed Overall Cognitive Status: Within Functional Limits for tasks assessed                                 General Comments: A little slow processing initially, likely due to meds        General Comments General comments (skin integrity, edema, etc.): x1 bout of emesis initially  upon sitting EOB, BP stable, pt believes it was due to the meds    Exercises     Assessment/Plan    PT Assessment Patient needs continued PT services  PT Problem List Decreased strength;Decreased range of motion;Decreased activity tolerance;Decreased balance;Decreased mobility;Impaired sensation;Pain       PT Treatment Interventions Gait training;DME instruction;Functional mobility training;Therapeutic activities;Therapeutic exercise;Balance training;Neuromuscular re-education;Patient/family education    PT Goals (Current goals can be found in the Care Plan section)  Acute Rehab PT Goals Patient Stated Goal: to improve PT Goal Formulation: With patient Time For Goal Achievement: 03/02/23 Potential to Achieve Goals: Good    Frequency 7X/week     Co-evaluation               AM-PAC PT "6 Clicks" Mobility  Outcome Measure Help needed turning from your back to your side while in a flat bed without using bedrails?: A Little Help needed moving from lying on your back to sitting on the side of a flat bed without using bedrails?: A Little Help needed moving to and from a bed to a chair (including a wheelchair)?: A Little  Help needed standing up from a chair using your arms (e.g., wheelchair or bedside chair)?: A Little Help needed to walk in hospital room?: A Little Help needed climbing 3-5 steps with a railing? : A Lot 6 Click Score: 17    End of Session Equipment Utilized During Treatment: Gait belt Activity Tolerance: Patient tolerated treatment well Patient left: in bed;with call bell/phone within reach Nurse Communication: Mobility status (pt had x1 bout of emesis) PT Visit Diagnosis: Unsteadiness on feet (R26.81);Other abnormalities of gait and mobility (R26.89);Difficulty in walking, not elsewhere classified (R26.2);Pain;Muscle weakness (generalized) (M62.81) Pain - Right/Left: Left Pain - part of body: Hip    Time: 6213-0865 PT Time Calculation (min) (ACUTE ONLY):  29 min   Charges:   PT Evaluation $PT Eval Low Complexity: 1 Low PT Treatments $Therapeutic Activity: 8-22 mins PT General Charges $$ ACUTE PT VISIT: 1 Visit         Alyssa Robinson, PT, DPT Acute Rehabilitation Services  Office: [phone]   Alyssa Robinson 02/23/2023, 3:54 PM

## 2023-02-23 NOTE — H&P (Signed)
PREOPERATIVE H&P  Chief Complaint: left hip osteoarthritis  HPI: Alyssa Robinson is a 72 y.o. female who presents for surgical treatment of left hip osteoarthritis.  She denies any changes in medical history.  Past Surgical History:  Procedure Laterality Date   ABDOMINAL HYSTERECTOMY     72 y/o   APPENDECTOMY  03/17/2013   Procedure: INCIDENTAL APPENDECTOMY;  Surgeon: Cherylynn Ridges, MD;  Location: [facility] OR;  Service: General;;   capsule endoscopy     COLON SURGERY     COLONOSCOPY     in 2012 -    COLOSTOMY CLOSURE  03/17/2013   COLOSTOMY REVERSAL  03/17/2013   Procedure: COLOSTOMY REVERSAL;  Surgeon: Cherylynn Ridges, MD;  Location: [facility] OR;  Service: General;;   EUS N/A 04/30/2015   Procedure: UPPER ENDOSCOPIC ULTRASOUND (EUS) LINEAR;  Surgeon: Jeani Hawking, MD;  Location: [facility];  Service: Endoscopy;  Laterality: N/A;   HERNIA REPAIR     X2 2-4 y/o inguinal   JOINT REPLACEMENT  ORID Knee/right hip   2021/2022   LAPAROTOMY N/A 10/17/2012   Procedure: EXPLORATORY LAPAROTOMY,  COLON RESECTION AND COLOSTOMY;  Surgeon: Cherylynn Ridges, MD;  Location: [facility] OR;  Service: General;  Laterality: N/A;   LIPOMA EXCISION Left    left arm   LYSIS OF ADHESION  03/17/2013   Procedure: LYSIS OF ADHESION;  Surgeon: Cherylynn Ridges, MD;  Location: [facility] OR;  Service: General;;   ORIF PATELLA Left 02/20/2020   Procedure: OPEN REDUCTION INTERNAL (ORIF) FIXATION LEFT PATELLA;  Surgeon: Tarry Kos, MD;  Location: [facility];  Service: Orthopedics;  Laterality: Left;   SMALL INTESTINE SURGERY  Perforated colon   2014   TOTAL HIP ARTHROPLASTY Right 09/12/2021   Procedure: RIGHT TOTAL HIP ARTHROPLASTY ANTERIOR APPROACH;  Surgeon: Tarry Kos, MD;  Location: [facility] OR;  Service: Orthopedics;  Laterality: Right;  3-C   UPPER GASTROINTESTINAL ENDOSCOPY     2013 -   Social History   Socioeconomic History   Marital status: Married    Spouse name: Not on file   Number of children: Not on  file   Years of education: Not on file   Highest education level: Bachelor's degree (e.g., BA, AB, BS)  Occupational History   Not on file  Tobacco Use   Smoking status: Never   Smokeless tobacco: Never   Tobacco comments:    Second hand smoke with mother/father smoking  Vaping Use   Vaping status: Never Used  Substance and Sexual Activity   Alcohol use: Not Currently    Alcohol/week: 2.0 standard drinks of alcohol    Types: 2 Glasses of wine per week   Drug use: No   Sexual activity: Yes    Birth control/protection: Post-menopausal  Other Topics Concern   Not on file  Social History Narrative   Not on file   Social Determinants of Health   Financial Resource Strain: Low Risk  (07/05/2021)   Overall Financial Resource Strain (CARDIA)    Difficulty of Paying Living Expenses: Not hard at all  Food Insecurity: No Food Insecurity (09/13/2022)   Hunger Vital Sign    Worried About Running Out of Food in the Last Year: Never true    Ran Out of Food in the Last Year: Never true  Transportation Needs: No Transportation Needs (09/13/2022)   PRAPARE - Administrator, Civil Service (Medical): No    Lack of Transportation (Non-Medical): No  Physical Activity:  Sufficiently Active (09/13/2022)   Exercise Vital Sign    Days of Exercise per Week: 5 days    Minutes of Exercise per Session: 60 min  Stress: No Stress Concern Present (09/13/2022)   [employer] of Occupational Health - Occupational Stress Questionnaire    Feeling of Stress : Not at all  Social Connections: Unknown (09/13/2022)   Social Connection and Isolation Panel [NHANES]    Frequency of Communication with Friends and Family: More than three times a week    Frequency of Social Gatherings with Friends and Family: More than three times a week    Attends Religious Services: More than 4 times per year    Active Member of [employer] or Organizations: Not on file    Attends Engineer, structural: Not on file     Marital Status: Married   Family History  Problem Relation Age of Onset   Esophageal cancer Mother        and ovarian cancer   Alcohol abuse Mother    Arthritis Mother    Cancer Mother    Depression Mother    Heart disease Father    Alcohol abuse Father    Hypertension Father    Diabetes Father    Early death Father    Breast cancer Sister    Colon cancer Other    Pancreatic cancer Other    Cancer Maternal Aunt    Cancer Maternal Aunt    Stroke Neg Hx    Hyperlipidemia Neg Hx    Kidney disease Neg Hx    COPD Neg Hx    Allergies  Allergen Reactions   Caffeine Nausea And Vomiting and Palpitations    Makes heart race    Fluad Quadrivalent [Influenza Vac A&B Sa Adj Quad] Nausea And Vomiting    Pt allergic to the 65 and over version of the flu vaccine, tolerates regular flu vaccine    Sodium Lauryl Sulfate Rash    Swollen gums    Prior to Admission medications   Medication Sig Start Date End Date Taking? Authorizing Provider  aspirin EC 81 MG tablet Take 1 tablet (81 mg total) by mouth 2 (two) times daily. To be taken after surgery to prevent blood clots 02/13/23 02/13/24  Cristie Hem, PA-C  docusate sodium (COLACE) 100 MG capsule Take 1 capsule (100 mg total) by mouth daily as needed. 02/13/23 02/13/24  Cristie Hem, PA-C  HYDROcodone-acetaminophen (NORCO) 5-325 MG tablet Take 1-2 tablets by mouth 3 (three) times daily as needed. To be taken after surgery to prevent blood clots 02/13/23   Cristie Hem, PA-C  loratadine (CLARITIN) 10 MG tablet Take 10 mg by mouth daily.   Yes [provider]  meclizine (ANTIVERT) 12.5 MG tablet Take 12.5 mg by mouth 3 (three) times daily as needed for dizziness.   Yes [provider]  methocarbamol (ROBAXIN-750) 750 MG tablet Take 1 tablet (750 mg total) by mouth 2 (two) times daily as needed for muscle spasms. 02/13/23   Cristie Hem, PA-C  ondansetron (ZOFRAN) 4 MG tablet Take 1 tablet (4 mg total) by mouth  every 8 (eight) hours as needed for nausea or vomiting. 02/13/23   Cristie Hem, PA-C  OVER THE COUNTER MEDICATION Take 1 mL by mouth daily. CBD oil   Yes [provider]  Pediatric Multiple Vit-C-FA (PEDIATRIC MULTIVITAMIN) chewable tablet Chew 1 tablet by mouth daily. Flintstone   Yes [provider]  Probiotic Product (ULTRAFLORA IMMUNE HEALTH PO)  Take 1 capsule by mouth daily.   Yes [provider]  sodium chloride (OCEAN) 0.65 % SOLN nasal spray Place 1 spray into both nostrils as needed for congestion.   Yes [provider]     Positive ROS: All other systems have been reviewed and were otherwise negative with the exception of those mentioned in the HPI and as above.  Physical Exam: General: Alert, no acute distress Cardiovascular: No pedal edema Respiratory: No cyanosis, no use of accessory musculature GI: abdomen soft Skin: No lesions in the area of chief complaint Neurologic: Sensation intact distally Psychiatric: Patient is competent for consent with normal mood and affect Lymphatic: no lymphedema  MUSCULOSKELETAL: exam stable  Assessment: left hip osteoarthritis  Plan: Plan for Procedure(s): LEFT TOTAL HIP ARTHROPLASTY ANTERIOR APPROACH  The risks benefits and alternatives were discussed with the patient including but not limited to the risks of nonoperative treatment, versus surgical intervention including infection, bleeding, nerve injury,  blood clots, cardiopulmonary complications, morbidity, mortality, among others, and they were willing to proceed.   Glee Arvin, MD 02/23/2023 6:36 AM

## 2023-02-23 NOTE — Anesthesia Preprocedure Evaluation (Addendum)
Anesthesia Evaluation  Patient identified by MRN, date of birth, ID band Patient awake    Reviewed: Allergy & Precautions, NPO status , Patient's Chart, lab work & pertinent test results  Airway Mallampati: II  TM Distance: >3 FB Neck ROM: Full    Dental  (+) Teeth Intact, Dental Advisory Given   Pulmonary neg pulmonary ROS   Pulmonary exam normal breath sounds clear to auscultation       Cardiovascular Exercise Tolerance: Good hypertension, Normal cardiovascular exam Rhythm:Regular Rate:Normal     Neuro/Psych  Headaches    GI/Hepatic Neg liver ROS,GERD  ,,  Endo/Other  negative endocrine ROS    Renal/GU negative Renal ROS     Musculoskeletal  (+) Arthritis , Osteoarthritis,    Abdominal   Peds  Hematology negative hematology ROS (+) Plt 296k   Anesthesia Other Findings Day of surgery medications reviewed with the patient.  Reproductive/Obstetrics                             Anesthesia Physical Anesthesia Plan  ASA: 2  Anesthesia Plan: Spinal   Post-op Pain Management: Tylenol PO (pre-op)*   Induction: Intravenous  PONV Risk Score and Plan: 2 and TIVA, Dexamethasone and Ondansetron  Airway Management Planned: Natural Airway and Simple Face Mask  Additional Equipment:   Intra-op Plan:   Post-operative Plan:   Informed Consent: I have reviewed the patients History and Physical, chart, labs and discussed the procedure including the risks, benefits and alternatives for the proposed anesthesia with the patient or authorized representative who has indicated his/her understanding and acceptance.     Dental advisory given  Plan Discussed with: CRNA  Anesthesia Plan Comments:        Anesthesia Quick Evaluation

## 2023-02-23 NOTE — Op Note (Signed)
LEFT TOTAL HIP ARTHROPLASTY ANTERIOR APPROACH  Procedure Note SHASTA CHINN   [id number]  Pre-op Diagnosis: Left hip osteoarthritis     Post-op Diagnosis: same  Operative Findings Severe degenerative disease Joint effusion, severe synovitis Slight leg length discrepancy   Operative Procedures  1. Total hip replacement; Left hip; uncemented [id number]   Surgeon: Gershon Mussel, M.D.  Assist: Oneal Grout, PA-C   Anesthesia: spinal  Prosthesis: Depuy Acetabulum: Pinnacle 50 mm Femur: Actis 5 STD Head: 32 mm size: +1 Liner: +4 Bearing Type: ceramic/poly  Total Hip Arthroplasty (Anterior Approach) Op Note:  After informed consent was obtained and the operative extremity marked in the holding area, the patient was brought back to the operating room and placed supine on the HANA table. Next, the operative extremity was prepped and draped in normal sterile fashion. Surgical timeout occurred verifying patient identification, surgical site, surgical procedure and administration of antibiotics.  A 10 cm longitudinal incision was made starting from 2 fingerbreadths lateral and inferior to the ASIS towards the lateral aspect of the patella.  A Hueter approach to the hip was performed, using the interval between tensor fascia lata and sartorius.  Dissection was carried bluntly down onto the anterior hip capsule. The lateral femoral circumflex vessels were identified and coagulated. A capsulotomy was performed and the capsular flaps tagged for later repair.  The neck osteotomy was performed. The femoral head was removed which showed complete loss of articular cartilage, the acetabular rim was cleared of soft tissue and osteophytes and attention was turned to reaming the acetabulum.  Sequential reaming was performed under fluoroscopic guidance down to the floor of the cotyloid fossa. We reamed to a size 49 mm, and then impacted the acetabular shell. A 25 mm cancellous screw was placed to  secure the shell.  The liner was then placed after irrigation and attention turned to the femur.  After placing the femoral hook, the leg was taken to externally rotated, extended and adducted position taking care to perform soft tissue releases to allow for adequate mobilization of the femur. Soft tissue was cleared from the shoulder of the greater trochanter and the hook elevator used to improve exposure of the proximal femur. Sequential broaching performed up to a size 5 standard. Trial neck and head were placed. The leg was brought back up to neutral and the construct reduced.  The position and sizing of components, offset and leg lengths were checked using fluoroscopy. Stability of the construct was checked in 45 degrees of hip extension and 90 degrees of external rotation without any subluxation, shuck or impingement of prosthesis. We dislocated the prosthesis, dropped the leg back into position, removed trial components, and irrigated copiously. The final stem and head was then placed, the leg brought back up, the system reduced and fluoroscopy used to verify positioning.  Antibiotic irrigation was placed in the surgical wound.   We irrigated, obtained hemostasis and closed the capsule using #2 ethibond suture.  A topical mixture of 0.25% bupivacaine and meloxicam was placed deep to the fascia.  One gram of vancomycin powder was placed in the surgical bed.   One gram of topical tranexamic acid was injected into the joint.  The fascia was closed with #1 vicryl plus, the deep fat layer was closed with 0 vicryl, the subcutaneous layers closed with 2.0 Vicryl Plus and the skin closed with 2.0 nylon and dermabond. A sterile dressing was applied. The patient was awakened in the operating room and taken to recovery  in stable condition.  All sponge, needle, and instrument counts were correct at the end of the case.   Tessa Lerner, my PA, was a medical necessity for opening, closing, limb positioning,  retracting, exposing, and overall facilitation and timely completion of the surgery.  Position: supine  Complications: see description of procedure.  Time Out: performed   Drains/Packing: none  Estimated blood loss: see anesthesia record  Returned to Recovery Room: in good condition.   Antibiotics: yes   Mechanical VTE (DVT) Prophylaxis: sequential compression devices, TED thigh-high  Chemical VTE (DVT) Prophylaxis: aspirin   Fluid Replacement: see anesthesia record  Specimens Removed: 1 to pathology   Sponge and Instrument Count Correct? yes   PACU: portable radiograph - low AP   Plan/RTC: Return in 2 weeks for staple removal. Weight Bearing/Load Lower Extremity: full  Hip precautions: none Suture Removal: 2 weeks   N. Glee Arvin, MD [facility] 7:25 AM   * No implants in log *

## 2023-02-23 NOTE — Transfer of Care (Signed)
Immediate Anesthesia Transfer of Care Note  Patient: Alyssa Robinson  Procedure(s) Performed: LEFT TOTAL HIP ARTHROPLASTY ANTERIOR APPROACH (Left: Hip)  Patient Location: PACU  Anesthesia Type:MAC and Spinal  Level of Consciousness: awake, alert , and oriented  Airway & Oxygen Therapy: Patient Spontanous Breathing and Patient connected to nasal cannula oxygen  Post-op Assessment: Report given to RN and Post -op Vital signs reviewed and stable  Post vital signs: Reviewed and stable  Last Vitals:  Vitals Value Taken Time  BP 122/66 02/23/23 0945  Temp    Pulse 58 02/23/23 0947  Resp 16 02/23/23 0947  SpO2 97 % 02/23/23 0947  Vitals shown include unfiled device data.  Last Pain:  Vitals:   02/23/23 0628  TempSrc:   PainSc: 0-No pain         Complications: No notable events documented.

## 2023-02-24 DIAGNOSIS — I10 Essential (primary) hypertension: Secondary | ICD-10-CM | POA: Diagnosis not present

## 2023-02-24 DIAGNOSIS — Z7982 Long term (current) use of aspirin: Secondary | ICD-10-CM | POA: Diagnosis not present

## 2023-02-24 DIAGNOSIS — Z79899 Other long term (current) drug therapy: Secondary | ICD-10-CM | POA: Diagnosis not present

## 2023-02-24 DIAGNOSIS — Z96641 Presence of right artificial hip joint: Secondary | ICD-10-CM | POA: Diagnosis not present

## 2023-02-24 DIAGNOSIS — M1612 Unilateral primary osteoarthritis, left hip: Secondary | ICD-10-CM | POA: Diagnosis not present

## 2023-02-24 MED ORDER — TRAMADOL HCL 50 MG PO TABS: 50.0000 mg | ORAL_TABLET | Freq: Four times a day (QID) | ORAL | 0 refills | Status: DC | PRN

## 2023-02-24 NOTE — Discharge Summary (Signed)
Patient ID: Alyssa Robinson MRN: [id number] DOB/AGE: 29-Mar-1951 48 y.o.  Admit date: 02/23/2023 Discharge date: 02/24/2023  Admission Diagnoses:  Status post total replacement of left hip  Discharge Diagnoses:  Principal Problem:   Status post total replacement of left hip   Past Medical History:  Diagnosis Date   Allergy 20-25 years ago   Anemia    hx   Arthritis 8-10 years ago   Blood transfusion without reported diagnosis 20 years ago   Bronchitis    Bronchitis    hx   Closed patellar sleeve fracture of left knee    Diverticulosis    GERD (gastroesophageal reflux disease)    occ   Headache(784.0)    migraines occ   Hypertension    no meds in over 46yrs   Pneumonia    Seasonal allergies     Surgeries: Procedure(s): LEFT TOTAL HIP ARTHROPLASTY ANTERIOR APPROACH on 02/23/2023   Consultants (if any):   Discharged Condition: Improved  Hospital Course: Alyssa Robinson is an 72 y.o. female who was admitted 02/23/2023 with a diagnosis of Status post total replacement of left hip and went to the operating room on 02/23/2023 and underwent the above named procedures.    She was given perioperative antibiotics:  Anti-infectives (From admission, onward)    Start     Dose/Rate Route Frequency Ordered Stop   02/23/23 1400  ceFAZolin (ANCEF) IVPB 2g/100 mL premix        2 g 200 mL/hr over 30 Minutes Intravenous Every 6 hours 02/23/23 1200 02/23/23 1920   02/23/23 0851  vancomycin (VANCOCIN) powder  Status:  Discontinued          As needed 02/23/23 0851 02/23/23 0942   02/23/23 0600  ceFAZolin (ANCEF) IVPB 2g/100 mL premix        2 g 200 mL/hr over 30 Minutes Intravenous On call to O.R. 02/23/23 8756 02/23/23 0806     .  She was given sequential compression devices, early ambulation, and appropriate chemoprophylaxis for DVT prophylaxis.  She benefited maximally from the hospital stay and there were no complications.    Recent vital signs:  Vitals:   02/24/23 0339  02/24/23 0716  BP: 128/86 121/66  Pulse: 68 66  Resp: 17 20  Temp: 98.2 F (36.8 C) 97.8 F (36.6 C)  SpO2: 97% 100%    Recent laboratory studies:  Lab Results  Component Value Date   HGB 13.5 02/15/2023   HGB 15.3 (H) 07/07/2022   HGB 13.4 09/13/2021   Lab Results  Component Value Date   WBC 6.2 02/15/2023   PLT 296 02/15/2023   Lab Results  Component Value Date   INR 1.20 10/17/2012   Lab Results  Component Value Date   NA 139 02/15/2023   K 4.4 02/15/2023   CL 103 02/15/2023   CO2 27 02/15/2023   BUN 14 02/15/2023   CREATININE 0.69 02/15/2023   GLUCOSE 104 (H) 02/15/2023    Discharge Medications:   Allergies as of 02/24/2023       Reactions   Caffeine Nausea And Vomiting, Palpitations   Makes heart race   Fluad Quadrivalent [influenza Vac A&b Sa Adj Quad] Nausea And Vomiting   Pt allergic to the 65 and over version of the flu vaccine, tolerates regular flu vaccine    Sodium Lauryl Sulfate Rash   Swollen gums        Medication List     TAKE these medications    aspirin EC  81 MG tablet Take 1 tablet (81 mg total) by mouth 2 (two) times daily. To be taken after surgery to prevent blood clots   docusate sodium 100 MG capsule Commonly known as: Colace Take 1 capsule (100 mg total) by mouth daily as needed.   HYDROcodone-acetaminophen 5-325 MG tablet Commonly known as: Norco Take 1-2 tablets by mouth 3 (three) times daily as needed. To be taken after surgery to prevent blood clots   loratadine 10 MG tablet Commonly known as: CLARITIN Take 10 mg by mouth daily.   meclizine 12.5 MG tablet Commonly known as: ANTIVERT Take 12.5 mg by mouth 3 (three) times daily as needed for dizziness.   methocarbamol 750 MG tablet Commonly known as: Robaxin-750 Take 1 tablet (750 mg total) by mouth 2 (two) times daily as needed for muscle spasms.   ondansetron 4 MG tablet Commonly known as: Zofran Take 1 tablet (4 mg total) by mouth every 8 (eight) hours as  needed for nausea or vomiting.   OVER THE COUNTER MEDICATION Take 1 mL by mouth daily. CBD oil   pediatric multivitamin chewable tablet Chew 1 tablet by mouth daily. Flintstone   sodium chloride 0.65 % Soln nasal spray Commonly known as: OCEAN Place 1 spray into both nostrils as needed for congestion.   traMADol 50 MG tablet Commonly known as: Ultram Take 1 tablet (50 mg total) by mouth every 6 (six) hours as needed.   ULTRAFLORA IMMUNE HEALTH PO Take 1 capsule by mouth daily.               Durable Medical Equipment  (From admission, onward)           Start     Ordered   02/23/23 1200  DME Walker rolling  Once       Question:  Patient needs a walker to treat with the following condition  Answer:  History of hip replacement   02/23/23 1200   02/23/23 1200  DME 3 n 1  Once        02/23/23 1200   02/23/23 1200  DME Bedside commode  Once       Question:  Patient needs a bedside commode to treat with the following condition  Answer:  History of hip replacement   02/23/23 1200            Diagnostic Studies: DG Pelvis Portable  Result Date: 02/23/2023 CLINICAL DATA:  Postop left hip arthroplasty. EXAM: PORTABLE PELVIS 1-2 VIEWS COMPARISON:  02/02/2023 FINDINGS: Left hip arthroplasty in expected alignment. No periprosthetic lucency or fracture. Recent postsurgical change includes air and edema in the soft tissues. Previous right hip arthroplasty IMPRESSION: Left hip arthroplasty without immediate postoperative complication. Electronically Signed   By: Narda Rutherford M.D.   On: 02/23/2023 11:18   DG HIP UNILAT WITH PELVIS 1V LEFT  Result Date: 02/23/2023 CLINICAL DATA:  Elective surgery.  Total left hip arthroplasty. EXAM: DG HIP (WITH OR WITHOUT PELVIS) 1V*L* COMPARISON:  Pelvis and left hip radiographs 02/02/2023 FINDINGS: Images were performed intraoperatively without the presence of a radiologist. Severe superior left femoroacetabular joint space narrowing,  subchondral sclerosis/cystic change, and peripheral osteophytosis. The patient is undergoing total left hip arthroplasty. Redemonstration of partially visualized total right hip arthroplasty. No hardware complication is seen. Total fluoroscopy images: 7 Total fluoroscopy time: 30 seconds Total dose: Radiation Exposure Index (as provided by the fluoroscopic device): 2.43 mGy air Kerma Please see intraoperative findings for further detail. IMPRESSION: Intraoperative fluoroscopy for total left hip  arthroplasty. Electronically Signed   By: Neita Garnet M.D.   On: 02/23/2023 10:01   DG C-Arm 1-60 Min-No Report  Result Date: 02/23/2023 Fluoroscopy was utilized by the requesting physician.  No radiographic interpretation.   XR HIP UNILAT W OR W/O PELVIS 2-3 VIEWS LEFT  Result Date: 02/03/2023 Stable right total hip replacement without complication.  Advanced degenerative joint disease with bone-on-bone joint space narrowing of the left hip.   Disposition:      Follow-up Information     Cristie Hem, PA-C. Schedule an appointment as soon as possible for a visit in 2 week(s).   Specialty: Orthopedic Surgery Contact information: [address] [place] [address] [phone]         [facility] Follow up.   Why: The home health agency will contact you for the first home visit. Contact information: [address] [place] [address] [phone]                  Signed: Huel Cote 02/24/2023, 8:12 AM

## 2023-02-24 NOTE — Plan of Care (Signed)
  Problem: Education: Goal: Knowledge of General Education information will improve Description: Including pain rating scale, medication(s)/side effects and non-pharmacologic comfort measures Outcome: Completed/Met   Problem: Health Behavior/Discharge Planning: Goal: Ability to manage health-related needs will improve Outcome: Completed/Met   Problem: Clinical Measurements: Goal: Ability to maintain clinical measurements within normal limits will improve Outcome: Completed/Met Goal: Will remain free from infection Outcome: Completed/Met Goal: Diagnostic test results will improve Outcome: Completed/Met Goal: Respiratory complications will improve Outcome: Completed/Met Goal: Cardiovascular complication will be avoided Outcome: Completed/Met   Problem: Activity: Goal: Risk for activity intolerance will decrease Outcome: Completed/Met   Problem: Nutrition: Goal: Adequate nutrition will be maintained Outcome: Completed/Met   Problem: Coping: Goal: Level of anxiety will decrease Outcome: Completed/Met   Problem: Elimination: Goal: Will not experience complications related to bowel motility Outcome: Completed/Met Goal: Will not experience complications related to urinary retention Outcome: Completed/Met   Problem: Pain Management: Goal: General experience of comfort will improve Outcome: Completed/Met   Problem: Safety: Goal: Ability to remain free from injury will improve Outcome: Completed/Met   Problem: Skin Integrity: Goal: Risk for impaired skin integrity will decrease Outcome: Completed/Met   Problem: Education: Goal: Knowledge of the prescribed therapeutic regimen will improve Outcome: Completed/Met Goal: Understanding of discharge needs will improve Outcome: Completed/Met Goal: Individualized Educational Video(s) Outcome: Completed/Met   Problem: Activity: Goal: Ability to avoid complications of mobility impairment will improve Outcome:  Completed/Met Goal: Ability to tolerate increased activity will improve Outcome: Completed/Met   Problem: Clinical Measurements: Goal: Postoperative complications will be avoided or minimized Outcome: Completed/Met   Problem: Pain Management: Goal: Pain level will decrease with appropriate interventions Outcome: Completed/Met   Problem: Skin Integrity: Goal: Will show signs of wound healing Outcome: Completed/Met   Problem: Acute Rehab PT Goals(only PT should resolve) Goal: Pt Will Go Supine/Side To Sit Outcome: Completed/Met Goal: Pt Will Go Sit To Supine/Side Outcome: Completed/Met Goal: Patient Will Transfer Sit To/From Stand Outcome: Completed/Met Goal: Pt Will Ambulate Outcome: Completed/Met Goal: Pt/caregiver will Perform Home Exercise Program Outcome: Completed/Met

## 2023-02-24 NOTE — Care Management (Signed)
Patient with order to DC to home today. Unit staff to provide DME needed for home.    Patient set up from the office prior to admission with [facility] services through [place] .   Liaison for agency has been notified of DC. Information added to AVS  Patient will have family/ friends provide transportation home. No other TOC needs identified for DC

## 2023-02-24 NOTE — Progress Notes (Signed)
Physical Therapy Treatment Patient Details Name: Alyssa Robinson MRN: [id number] DOB: 05/15/1950 Today's Date: 02/24/2023   History of Present Illness Pt is a 72 y.o. female who presented 02/23/23 for elective L anterior approach [facility]A. PMH: R [facility]A 2023, anemia, GERD, HTN    PT Comments  Pt received in supine and agreeable to session. Pt reports feeling increased fatigue from medication this morning. Pt instructed in surgical hip HEP and demonstrates exercises with cues for technique. Pt able to tolerate gait trial with supervision, however continues to be limited by pain. Pt able to use RLE to manage LLE for bed mobility and reports husband will be present to assist as needed at home. Pt continues to benefit from PT services to progress toward functional mobility goals.     If plan is discharge home, recommend the following: A little help with bathing/dressing/bathroom;Assistance with cooking/housework;Assist for transportation   Can travel by private vehicle        Equipment Recommendations  None recommended by PT    Recommendations for Other Services       Precautions / Restrictions Precautions Precautions: Fall Restrictions Weight Bearing Restrictions: Yes LLE Weight Bearing: Weight bearing as tolerated Other Position/Activity Restrictions: no hip precautions     Mobility  Bed Mobility Overal bed mobility: Needs Assistance Bed Mobility: Supine to Sit, Sit to Supine     Supine to sit: HOB elevated, Used rails, Supervision Sit to supine: HOB elevated, Used rails, Supervision   General bed mobility comments: increased time and cues for technique. Pt using RLE to manage LLE    Transfers Overall transfer level: Needs assistance Equipment used: Rolling walker (2 wheels) Transfers: Sit to/from Stand Sit to Stand: Supervision           General transfer comment: increased time and safe hand placement    Ambulation/Gait Ambulation/Gait assistance: Supervision Gait  Distance (Feet): 80 Feet Assistive device: Rolling walker (2 wheels) Gait Pattern/deviations: Step-through pattern, Decreased stance time - left, Decreased stride length, Decreased weight shift to left, Antalgic, Trunk flexed Gait velocity: reduced     General Gait Details: slow step-through pattern with decreased LLE WB tolerance. Cues for RW proximity and upright posture      Balance Overall balance assessment: Needs assistance Sitting-balance support: No upper extremity supported, Feet supported Sitting balance-Leahy Scale: Good Sitting balance - Comments: sitting EOB   Standing balance support: Bilateral upper extremity supported, During functional activity, Reliant on assistive device for balance Standing balance-Leahy Scale: Poor Standing balance comment: Reliant on RW due to pain                            Cognition Arousal: Alert Behavior During Therapy: WFL for tasks assessed/performed Overall Cognitive Status: Within Functional Limits for tasks assessed                                          Exercises Total Joint Exercises Ankle Circles/Pumps: AROM, Supine, Both, 5 reps Quad Sets: AROM, Supine, Both, 5 reps Heel Slides: AROM, Supine, Left, 5 reps Hip ABduction/ADduction: AROM, Supine, Left, 5 reps Long Arc Quad: AROM, Seated, Left, 5 reps    General Comments        Pertinent Vitals/Pain Pain Assessment Pain Assessment: 0-10 Pain Score: 8  Pain Location: L hip Pain Descriptors / Indicators: Discomfort, Grimacing, Operative site guarding, Moaning Pain  Intervention(s): Limited activity within patient's tolerance, Monitored during session, Repositioned, Ice applied     PT Goals (current goals can now be found in the care plan section) Acute Rehab PT Goals Patient Stated Goal: to improve PT Goal Formulation: With patient Time For Goal Achievement: 03/02/23 Progress towards PT goals: Progressing toward goals    Frequency     7X/week       AM-PAC PT "6 Clicks" Mobility   Outcome Measure  Help needed turning from your back to your side while in a flat bed without using bedrails?: None Help needed moving from lying on your back to sitting on the side of a flat bed without using bedrails?: A Little Help needed moving to and from a bed to a chair (including a wheelchair)?: A Little Help needed standing up from a chair using your arms (e.g., wheelchair or bedside chair)?: A Little Help needed to walk in hospital room?: A Little Help needed climbing 3-5 steps with a railing? : A Little 6 Click Score: 19    End of Session   Activity Tolerance: Patient tolerated treatment well;Patient limited by pain;Patient limited by fatigue Patient left: in bed;with call bell/phone within reach Nurse Communication: Mobility status PT Visit Diagnosis: Unsteadiness on feet (R26.81);Other abnormalities of gait and mobility (R26.89);Difficulty in walking, not elsewhere classified (R26.2);Pain;Muscle weakness (generalized) (M62.81)     Time: 1610-9604 PT Time Calculation (min) (ACUTE ONLY): 16 min  Charges:    $Therapeutic Exercise: 8-22 mins PT General Charges $$ ACUTE PT VISIT: 1 Visit                     Johny Shock, PTA Acute Rehabilitation Services Secure Chat Preferred  [phone]    Johny Shock 02/24/2023, 9:27 AM

## 2023-02-24 NOTE — Progress Notes (Signed)
   Subjective:  Patient reports pain as mild.  Tolerating diet.  Ambulated with physical therapy.  Voiding.  Overall doing well  Objective:   VITALS:   Vitals:   02/23/23 1947 02/23/23 2256 02/24/23 0339 02/24/23 0716  BP: 134/72 (!) 143/71 128/86 121/66  Pulse: 66 68 68 66  Resp: 17 17 17 20   Temp: 98.4 F (36.9 C) 98.1 F (36.7 C) 98.2 F (36.8 C) 97.8 F (36.6 C)  TempSrc: Oral Oral Oral Oral  SpO2: 99% 98% 97% 100%  Weight:      Height:       Left hip incision is well-appearing without erythema or drainage.  Fires tibialis anterior as well as gastrocsoleus.  2+ dorsalis pedis pulse in the left  Lab Results  Component Value Date   WBC 6.2 02/15/2023   HGB 13.5 02/15/2023   HCT 41.5 02/15/2023   MCV 98.3 02/15/2023   PLT 296 02/15/2023     Assessment/Plan:  1 Day Post-Op status post left total hip arthroplasty overall doing extremely well.  She was feeling a little bit sedated from the narcotic pills and is asking for tramadol as well on discharge  - Expected postop acute blood loss anemia - will monitor for symptoms - Patient to work with PT to optimize mobilization safely - DVT ppx - SCDs, ambulation, aspirin - WBAT operative extremity - Pain control - multimodal pain management, ATC acetaminophen in conjunction with as needed narcotic (oxycodone), although this should be minimized with other modalities  - Discharge home today  [facility] 02/24/2023, 8:09 AM

## 2023-02-24 NOTE — Progress Notes (Signed)
Patient alert and oriented, mae's well, voiding adequate amount of urine, swallowing without difficulty, no c/o pain at time of discharge. Patient discharged home with spouse. Script and discharged instructions given to patient. Patient and spouse stated understanding of instructions given. Patient has an appointment with Dr. Roda Shutters

## 2023-02-26 ENCOUNTER — Telehealth: Payer: Self-pay | Admitting: Orthopaedic Surgery

## 2023-02-26 NOTE — Telephone Encounter (Signed)
Patient has a question regarding the pain medication tramadol. Wants to know if she should be taking this with the extra strength tylenol. Would like a call back to discuss this.

## 2023-02-26 NOTE — Telephone Encounter (Signed)
Called and notified via voicemail. It is fine to take both if needed.

## 2023-02-27 ENCOUNTER — Encounter (HOSPITAL_COMMUNITY): Payer: Self-pay | Admitting: Orthopaedic Surgery

## 2023-02-27 ENCOUNTER — Telehealth: Payer: Self-pay | Admitting: Orthopaedic Surgery

## 2023-02-27 DIAGNOSIS — K219 Gastro-esophageal reflux disease without esophagitis: Secondary | ICD-10-CM | POA: Diagnosis not present

## 2023-02-27 DIAGNOSIS — G43909 Migraine, unspecified, not intractable, without status migrainosus: Secondary | ICD-10-CM | POA: Diagnosis not present

## 2023-02-27 DIAGNOSIS — Z471 Aftercare following joint replacement surgery: Secondary | ICD-10-CM | POA: Diagnosis not present

## 2023-02-27 DIAGNOSIS — I10 Essential (primary) hypertension: Secondary | ICD-10-CM | POA: Diagnosis not present

## 2023-02-27 DIAGNOSIS — Z7982 Long term (current) use of aspirin: Secondary | ICD-10-CM | POA: Diagnosis not present

## 2023-02-27 DIAGNOSIS — Z96642 Presence of left artificial hip joint: Secondary | ICD-10-CM | POA: Diagnosis not present

## 2023-02-27 DIAGNOSIS — Z79891 Long term (current) use of opiate analgesic: Secondary | ICD-10-CM | POA: Diagnosis not present

## 2023-02-27 NOTE — Telephone Encounter (Signed)
Almira from [place] called in requesting verbal orders for PT 3 WEEK 2 cb#

## 2023-02-27 NOTE — Telephone Encounter (Signed)
Almira from [place] called in requesting verbal orders for PT 3 WEEK 2   CB [phone] Secure VM

## 2023-02-27 NOTE — Telephone Encounter (Signed)
Called and gave verbal via voicemail.

## 2023-02-28 ENCOUNTER — Other Ambulatory Visit: Payer: Self-pay | Admitting: Physician Assistant

## 2023-02-28 ENCOUNTER — Telehealth: Payer: Self-pay

## 2023-02-28 MED ORDER — TRAMADOL HCL 50 MG PO TABS: 50.0000 mg | ORAL_TABLET | Freq: Four times a day (QID) | ORAL | 2 refills | Status: DC | PRN

## 2023-02-28 MED ORDER — CELECOXIB 100 MG PO CAPS: 100.0000 mg | ORAL_CAPSULE | Freq: Two times a day (BID) | ORAL | 1 refills | Status: DC | PRN

## 2023-02-28 NOTE — Telephone Encounter (Signed)
sent 

## 2023-02-28 NOTE — Telephone Encounter (Signed)
Patient would like a Rx refill for Tramadol sent to her pharmacy.  Cb# [phone].  Please advise.  Thank you.

## 2023-03-01 DIAGNOSIS — Z471 Aftercare following joint replacement surgery: Secondary | ICD-10-CM | POA: Diagnosis not present

## 2023-03-01 DIAGNOSIS — Z96642 Presence of left artificial hip joint: Secondary | ICD-10-CM | POA: Diagnosis not present

## 2023-03-01 DIAGNOSIS — G43909 Migraine, unspecified, not intractable, without status migrainosus: Secondary | ICD-10-CM | POA: Diagnosis not present

## 2023-03-01 DIAGNOSIS — I10 Essential (primary) hypertension: Secondary | ICD-10-CM | POA: Diagnosis not present

## 2023-03-01 DIAGNOSIS — K219 Gastro-esophageal reflux disease without esophagitis: Secondary | ICD-10-CM | POA: Diagnosis not present

## 2023-03-01 DIAGNOSIS — Z7982 Long term (current) use of aspirin: Secondary | ICD-10-CM | POA: Diagnosis not present

## 2023-03-02 DIAGNOSIS — G43909 Migraine, unspecified, not intractable, without status migrainosus: Secondary | ICD-10-CM | POA: Diagnosis not present

## 2023-03-02 DIAGNOSIS — K219 Gastro-esophageal reflux disease without esophagitis: Secondary | ICD-10-CM | POA: Diagnosis not present

## 2023-03-02 DIAGNOSIS — Z96642 Presence of left artificial hip joint: Secondary | ICD-10-CM | POA: Diagnosis not present

## 2023-03-02 DIAGNOSIS — Z471 Aftercare following joint replacement surgery: Secondary | ICD-10-CM | POA: Diagnosis not present

## 2023-03-02 DIAGNOSIS — I10 Essential (primary) hypertension: Secondary | ICD-10-CM | POA: Diagnosis not present

## 2023-03-02 DIAGNOSIS — Z7982 Long term (current) use of aspirin: Secondary | ICD-10-CM | POA: Diagnosis not present

## 2023-03-05 DIAGNOSIS — Z96642 Presence of left artificial hip joint: Secondary | ICD-10-CM | POA: Diagnosis not present

## 2023-03-05 DIAGNOSIS — G43909 Migraine, unspecified, not intractable, without status migrainosus: Secondary | ICD-10-CM | POA: Diagnosis not present

## 2023-03-05 DIAGNOSIS — Z471 Aftercare following joint replacement surgery: Secondary | ICD-10-CM | POA: Diagnosis not present

## 2023-03-05 DIAGNOSIS — Z7982 Long term (current) use of aspirin: Secondary | ICD-10-CM | POA: Diagnosis not present

## 2023-03-05 DIAGNOSIS — I10 Essential (primary) hypertension: Secondary | ICD-10-CM | POA: Diagnosis not present

## 2023-03-05 DIAGNOSIS — K219 Gastro-esophageal reflux disease without esophagitis: Secondary | ICD-10-CM | POA: Diagnosis not present

## 2023-03-06 DIAGNOSIS — Z7982 Long term (current) use of aspirin: Secondary | ICD-10-CM | POA: Diagnosis not present

## 2023-03-06 DIAGNOSIS — K219 Gastro-esophageal reflux disease without esophagitis: Secondary | ICD-10-CM | POA: Diagnosis not present

## 2023-03-06 DIAGNOSIS — G43909 Migraine, unspecified, not intractable, without status migrainosus: Secondary | ICD-10-CM | POA: Diagnosis not present

## 2023-03-06 DIAGNOSIS — I10 Essential (primary) hypertension: Secondary | ICD-10-CM | POA: Diagnosis not present

## 2023-03-06 DIAGNOSIS — Z96642 Presence of left artificial hip joint: Secondary | ICD-10-CM | POA: Diagnosis not present

## 2023-03-06 DIAGNOSIS — Z471 Aftercare following joint replacement surgery: Secondary | ICD-10-CM | POA: Diagnosis not present

## 2023-03-09 ENCOUNTER — Encounter: Payer: Self-pay | Admitting: Orthopaedic Surgery

## 2023-03-09 ENCOUNTER — Ambulatory Visit (INDEPENDENT_AMBULATORY_CARE_PROVIDER_SITE_OTHER): Payer: Medicare Other | Admitting: Orthopaedic Surgery

## 2023-03-09 DIAGNOSIS — I10 Essential (primary) hypertension: Secondary | ICD-10-CM | POA: Diagnosis not present

## 2023-03-09 DIAGNOSIS — Z7982 Long term (current) use of aspirin: Secondary | ICD-10-CM | POA: Diagnosis not present

## 2023-03-09 DIAGNOSIS — K219 Gastro-esophageal reflux disease without esophagitis: Secondary | ICD-10-CM | POA: Diagnosis not present

## 2023-03-09 DIAGNOSIS — Z471 Aftercare following joint replacement surgery: Secondary | ICD-10-CM | POA: Diagnosis not present

## 2023-03-09 DIAGNOSIS — G43909 Migraine, unspecified, not intractable, without status migrainosus: Secondary | ICD-10-CM | POA: Diagnosis not present

## 2023-03-09 DIAGNOSIS — Z96642 Presence of left artificial hip joint: Secondary | ICD-10-CM | POA: Diagnosis not present

## 2023-03-09 NOTE — Progress Notes (Signed)
Post-Op Visit Note   Patient: Alyssa Robinson           Date of Birth: 12-10-1950           MRN: [id number] Visit Date: 03/09/2023 PCP: Myrlene Broker, MD   Assessment & Plan:  Chief Complaint:  Chief Complaint  Patient presents with   Left Hip - Pain   Visit Diagnoses:  1. Status post total replacement of left hip     Plan: [name] is 2 weeks postop from the left total hip replacement.  She is doing well overall and has no real complaints.  She has been using a walker.  She has been taking tramadol and Tylenol Extra Strength.  She has stayed away from opioids because she got really constipated with her other hip surgery.  Exam of the left hip shows healed surgical incision.  No signs of infection.  Expected postoperative swelling of the thigh and extremity.  Sutures removed Steri-Strips applied.  Instructions reviewed.  She will do home exercises after she finishes home health PT.  I do not think she needs any outpatient PT because she is doing well and she has experience from her other hip replacement.  Recheck in 4 weeks.  Follow-Up Instructions: Return in about 4 weeks (around 04/06/2023).   Orders:  No orders of the defined types were placed in this encounter.  No orders of the defined types were placed in this encounter.   Imaging: No results found.  PMFS History: Patient Active Problem List   Diagnosis Date Noted   Status post total replacement of left hip 02/23/2023   Bronchitis 09/13/2022   History of colonic polyps 02/03/2022   Family history of early CAD 07/02/2020   Lipoma of abdominal wall 08/04/2019   Osteopenia 01/28/2019   Allergic rhinitis 01/28/2019   Thrombocytopenia (HCC) 10/17/2017   Mass of forearm, left 11/05/2015   Hyperlipidemia with target LDL less than 130 09/14/2015   Vaginal atrophy 06/01/2015   Cyst of pancreas 03/06/2015   Glaucoma suspect of both eyes 03/11/2014   Nuclear sclerosis of both eyes 03/11/2014   Routine general  medical examination at a health care facility 12/11/2012   Essential hypertension, benign 12/11/2012   Past Medical History:  Diagnosis Date   Allergy 20-25 years ago   Anemia    hx   Arthritis 8-10 years ago   Blood transfusion without reported diagnosis 20 years ago   Bronchitis    Bronchitis    hx   Closed patellar sleeve fracture of left knee    Diverticulosis    GERD (gastroesophageal reflux disease)    occ   Headache(784.0)    migraines occ   Hypertension    no meds in over 24yrs   Pneumonia    Seasonal allergies     Family History  Problem Relation Age of Onset   Esophageal cancer Mother        and ovarian cancer   Alcohol abuse Mother    Arthritis Mother    Cancer Mother    Depression Mother    Heart disease Father    Alcohol abuse Father    Hypertension Father    Diabetes Father    Early death Father    Breast cancer Sister    Colon cancer Other    Pancreatic cancer Other    Cancer Maternal Aunt    Cancer Maternal Aunt    Stroke Neg Hx    Hyperlipidemia Neg Hx    Kidney  disease Neg Hx    COPD Neg Hx     Past Surgical History:  Procedure Laterality Date   ABDOMINAL HYSTERECTOMY     72 y/o   APPENDECTOMY  03/17/2013   Procedure: INCIDENTAL APPENDECTOMY;  Surgeon: Cherylynn Ridges, MD;  Location: [facility] OR;  Service: General;;   capsule endoscopy     COLON SURGERY     COLONOSCOPY     in 2012 -    COLOSTOMY CLOSURE  03/17/2013   COLOSTOMY REVERSAL  03/17/2013   Procedure: COLOSTOMY REVERSAL;  Surgeon: Cherylynn Ridges, MD;  Location: [facility] OR;  Service: General;;   EUS N/A 04/30/2015   Procedure: UPPER ENDOSCOPIC ULTRASOUND (EUS) LINEAR;  Surgeon: Jeani Hawking, MD;  Location: [facility];  Service: Endoscopy;  Laterality: N/A;   HERNIA REPAIR     X2 2-4 y/o inguinal   JOINT REPLACEMENT  ORID Knee/right hip   2021/2022   LAPAROTOMY N/A 10/17/2012   Procedure: EXPLORATORY LAPAROTOMY,  COLON RESECTION AND COLOSTOMY;  Surgeon: Cherylynn Ridges, MD;  Location: [facility]  OR;  Service: General;  Laterality: N/A;   LIPOMA EXCISION Left    left arm   LYSIS OF ADHESION  03/17/2013   Procedure: LYSIS OF ADHESION;  Surgeon: Cherylynn Ridges, MD;  Location: [facility] OR;  Service: General;;   ORIF PATELLA Left 02/20/2020   Procedure: OPEN REDUCTION INTERNAL (ORIF) FIXATION LEFT PATELLA;  Surgeon: Tarry Kos, MD;  Location: [facility];  Service: Orthopedics;  Laterality: Left;   SMALL INTESTINE SURGERY  Perforated colon   2014   TOTAL HIP ARTHROPLASTY Right 09/12/2021   Procedure: RIGHT TOTAL HIP ARTHROPLASTY ANTERIOR APPROACH;  Surgeon: Tarry Kos, MD;  Location: [facility] OR;  Service: Orthopedics;  Laterality: Right;  3-C   TOTAL HIP ARTHROPLASTY Left 02/23/2023   Procedure: LEFT TOTAL HIP ARTHROPLASTY ANTERIOR APPROACH;  Surgeon: Tarry Kos, MD;  Location: [facility] OR;  Service: Orthopedics;  Laterality: Left;  3-C   UPPER GASTROINTESTINAL ENDOSCOPY     2013 -   Social History   Occupational History   Not on file  Tobacco Use   Smoking status: Never   Smokeless tobacco: Never   Tobacco comments:    Second hand smoke with mother/father smoking  Vaping Use   Vaping status: Never Used  Substance and Sexual Activity   Alcohol use: Not Currently    Alcohol/week: 2.0 standard drinks of alcohol    Types: 2 Glasses of wine per week   Drug use: No   Sexual activity: Yes    Birth control/protection: Post-menopausal

## 2023-03-19 ENCOUNTER — Ambulatory Visit: Payer: BLUE CROSS/BLUE SHIELD

## 2023-03-20 DIAGNOSIS — L853 Xerosis cutis: Secondary | ICD-10-CM | POA: Diagnosis not present

## 2023-04-06 ENCOUNTER — Other Ambulatory Visit (INDEPENDENT_AMBULATORY_CARE_PROVIDER_SITE_OTHER): Payer: Self-pay

## 2023-04-06 ENCOUNTER — Encounter: Payer: Self-pay | Admitting: Orthopaedic Surgery

## 2023-04-06 ENCOUNTER — Ambulatory Visit (INDEPENDENT_AMBULATORY_CARE_PROVIDER_SITE_OTHER): Payer: Medicare Other | Admitting: Orthopaedic Surgery

## 2023-04-06 DIAGNOSIS — Z96642 Presence of left artificial hip joint: Secondary | ICD-10-CM

## 2023-04-06 NOTE — Progress Notes (Signed)
Post-Op Visit Note   Patient: Alyssa Robinson           Date of Birth: 1950/09/05           MRN: [id number] Visit Date: 04/06/2023 PCP: Myrlene Broker, MD   Assessment & Plan:  Chief Complaint:  Chief Complaint  Patient presents with   Left Hip - Routine Post Op, Follow-up   Visit Diagnoses:  1. Status post total replacement of left hip     Plan: Alyssa Robinson is 6 weeks status post left total hip replacement.  She is doing very well has no real complaints.  She feels some start up stiffness after sitting.  She has felt well.  Exam of the left hip shows a fully healed surgical scar.  Fluid painless range of motion.  Equal leg lengths.  I am very happy with Alyssa Robinson's progress and recovery.  She can advance activity as tolerated.  Questions answered today.  Dental prophylaxis reinforced.  Recheck in 6 weeks  Follow-Up Instructions: Return in about 6 weeks (around 05/18/2023).   Orders:  Orders Placed This Encounter  Procedures   XR Pelvis 1-2 Views   No orders of the defined types were placed in this encounter.   Imaging: XR Pelvis 1-2 Views Result Date: 04/06/2023 Stable bilateral total hip replacement without complications   PMFS History: Patient Active Problem List   Diagnosis Date Noted   Status post total replacement of left hip 02/23/2023   Bronchitis 09/13/2022   History of colonic polyps 02/03/2022   Family history of early CAD 07/02/2020   Lipoma of abdominal wall 08/04/2019   Osteopenia 01/28/2019   Allergic rhinitis 01/28/2019   Thrombocytopenia (HCC) 10/17/2017   Mass of forearm, left 11/05/2015   Hyperlipidemia with target LDL less than 130 09/14/2015   Vaginal atrophy 06/01/2015   Cyst of pancreas 03/06/2015   Glaucoma suspect of both eyes 03/11/2014   Nuclear sclerosis of both eyes 03/11/2014   Routine general medical examination at a health care facility 12/11/2012   Essential hypertension, benign 12/11/2012   Past Medical History:  Diagnosis  Date   Allergy 20-25 years ago   Anemia    hx   Arthritis 8-10 years ago   Blood transfusion without reported diagnosis 20 years ago   Bronchitis    Bronchitis    hx   Closed patellar sleeve fracture of left knee    Diverticulosis    GERD (gastroesophageal reflux disease)    occ   Headache(784.0)    migraines occ   Hypertension    no meds in over 79yrs   Pneumonia    Seasonal allergies     Family History  Problem Relation Age of Onset   Esophageal cancer Mother        and ovarian cancer   Alcohol abuse Mother    Arthritis Mother    Cancer Mother    Depression Mother    Heart disease Father    Alcohol abuse Father    Hypertension Father    Diabetes Father    Early death Father    Breast cancer Sister    Colon cancer Other    Pancreatic cancer Other    Cancer Maternal Aunt    Cancer Maternal Aunt    Stroke Neg Hx    Hyperlipidemia Neg Hx    Kidney disease Neg Hx    COPD Neg Hx     Past Surgical History:  Procedure Laterality Date   ABDOMINAL HYSTERECTOMY  72 y/o   APPENDECTOMY  03/17/2013   Procedure: INCIDENTAL APPENDECTOMY;  Surgeon: Cherylynn Ridges, MD;  Location: [facility] OR;  Service: General;;   capsule endoscopy     COLON SURGERY     COLONOSCOPY     in 2012 -    COLOSTOMY CLOSURE  03/17/2013   COLOSTOMY REVERSAL  03/17/2013   Procedure: COLOSTOMY REVERSAL;  Surgeon: Cherylynn Ridges, MD;  Location: [facility] OR;  Service: General;;   EUS N/A 04/30/2015   Procedure: UPPER ENDOSCOPIC ULTRASOUND (EUS) LINEAR;  Surgeon: Jeani Hawking, MD;  Location: [facility];  Service: Endoscopy;  Laterality: N/A;   HERNIA REPAIR     X2 2-4 y/o inguinal   JOINT REPLACEMENT  ORID Knee/right hip   2021/2022   LAPAROTOMY N/A 10/17/2012   Procedure: EXPLORATORY LAPAROTOMY,  COLON RESECTION AND COLOSTOMY;  Surgeon: Cherylynn Ridges, MD;  Location: [facility] OR;  Service: General;  Laterality: N/A;   LIPOMA EXCISION Left    left arm   LYSIS OF ADHESION  03/17/2013   Procedure: LYSIS OF  ADHESION;  Surgeon: Cherylynn Ridges, MD;  Location: [facility] OR;  Service: General;;   ORIF PATELLA Left 02/20/2020   Procedure: OPEN REDUCTION INTERNAL (ORIF) FIXATION LEFT PATELLA;  Surgeon: Tarry Kos, MD;  Location: [facility];  Service: Orthopedics;  Laterality: Left;   SMALL INTESTINE SURGERY  Perforated colon   2014   TOTAL HIP ARTHROPLASTY Right 09/12/2021   Procedure: RIGHT TOTAL HIP ARTHROPLASTY ANTERIOR APPROACH;  Surgeon: Tarry Kos, MD;  Location: [facility] OR;  Service: Orthopedics;  Laterality: Right;  3-C   TOTAL HIP ARTHROPLASTY Left 02/23/2023   Procedure: LEFT TOTAL HIP ARTHROPLASTY ANTERIOR APPROACH;  Surgeon: Tarry Kos, MD;  Location: [facility] OR;  Service: Orthopedics;  Laterality: Left;  3-C   UPPER GASTROINTESTINAL ENDOSCOPY     2013 -   Social History   Occupational History   Not on file  Tobacco Use   Smoking status: Never   Smokeless tobacco: Never   Tobacco comments:    Second hand smoke with mother/father smoking  Vaping Use   Vaping status: Never Used  Substance and Sexual Activity   Alcohol use: Not Currently    Alcohol/week: 2.0 standard drinks of alcohol    Types: 2 Glasses of wine per week   Drug use: No   Sexual activity: Yes    Birth control/protection: Post-menopausal

## 2023-04-09 DIAGNOSIS — Z96642 Presence of left artificial hip joint: Secondary | ICD-10-CM

## 2023-04-11 ENCOUNTER — Ambulatory Visit: Payer: Self-pay | Admitting: Internal Medicine

## 2023-04-11 ENCOUNTER — Telehealth: Payer: Self-pay

## 2023-04-11 NOTE — Telephone Encounter (Signed)
Patient called stating that she has been having dizzy spells and wanted to know if this is normal.  Patient had left THA on 02/23/2023.  CB# [phone].  Please advise.  Thank you

## 2023-04-11 NOTE — Telephone Encounter (Signed)
I would have her take her blood pressure to make sure it is not too low.  Make sure she is drinking plenty of fluids.  Is she on any new meds she did not take after last surgery?

## 2023-04-11 NOTE — Telephone Encounter (Signed)
Chief Complaint: dizziness Symptoms: dizziness, occasional stiff neck Frequency: 1 week, intermittent Pertinent Negatives: Patient denies LOC, falls, chest pain, fever, vomiting, bleeding Disposition: [] ED /[] Urgent Care (no appt availability in office) / [x] Appointment(In office/virtual)/ []  [facility]/ [] Home Care/ [] Refused Recommended Disposition /[] [facility]/ []  Follow-up with PCP Additional Notes: Pt calls stating that for the last week she has had intermittent dizziness with movement and occasional stiff neck. States she had hip surgery approx 7 weeks ago and was seen by surgeon on 12/13- states he did not seem concerned with this. She states she had the feeling the room was spinning last night and this morning has taken a meclizine, which improved symptoms some. States she has also had wax buildup in L ear recently. During call she states that prior to surgery she began using CBD to help with pain and sleep and she is aware one of the side effects is dizziness, so she has decided not to take it any longer. Per protocol, pt to be evaluated within 3 days. Pt scheduled for 04/13/23 at 0940. Care advice reviewed, pt verbalized understanding and denies further questions at this time. Alerting PCP for review.   Copied from CRM [id number]. Topic: Clinical - Pink Word Triage >> Apr 11, 2023 12:41 PM Corin V wrote: Reason for Triage: Hip replacement 7 weeks ago. last week or so, dizzy spells last week , lightheaded Took 1 meclazine this AM. Having stiff neck. No fall. Possible inner ear infection \ Reason for Disposition  [1] MODERATE dizziness (e.g., interferes with normal activities) AND [2] has been evaluated by doctor (or NP/PA) for this  Answer Assessment - Initial Assessment Questions 1. DESCRIPTION: "Describe your dizziness."     In the mornings when getting up, states it is with movement R to L not as significant, but looking down causes great dizziness. 2.  LIGHTHEADED: "Do you feel lightheaded?" (e.g., somewhat faint, woozy, weak upon standing)     flushed 3. VERTIGO: "Do you feel like either you or the room is spinning or tilting?" (i.e. vertigo)    Had that feeling last night. 4. SEVERITY: "How bad is it?"  "Do you feel like you are going to faint?" "Can you stand and walk?"   - MILD: Feels slightly dizzy, but walking normally.   - MODERATE: Feels unsteady when walking, but not falling; interferes with normal activities (e.g., school, work).   - SEVERE: Unable to walk without falling, or requires assistance to walk without falling; feels like passing out now.      Moderate 5. ONSET:  "When did the dizziness begin?"     Approx a couple of weeks every so often 6. AGGRAVATING FACTORS: "Does anything make it worse?" (e.g., standing, change in head position)     Change in position 7. HEART RATE: "Can you tell me your heart rate?" "How many beats in 15 seconds?"  (Note: not all patients can do this)       Checked with home machine during call: HR 80 8. CAUSE: "What do you think is causing the dizziness?"     States she has been taking [employer] CBD to help with sleep for approx 7 weeks, started prior to surgery. She also states she has been having wax fall from L ear. 9. RECURRENT SYMPTOM: "Have you had dizziness before?" If Yes, ask: "When was the last time?" "What happened that time?"     Has intermittently, but did not last this long. 10. OTHER SYMPTOMS: "Do  you have any other symptoms?" (e.g., fever, chest pain, vomiting, diarrhea, bleeding)       Chills, L ear crusting, diarrhea this morning- minimal towards end of bowel movement  Protocols used: Dizziness - Lightheadedness-A-AH

## 2023-04-11 NOTE — Telephone Encounter (Signed)
I agree that should be related to the surgery at this point

## 2023-04-11 NOTE — Telephone Encounter (Signed)
Spoke with patient. She has an appointment to see her PCP Friday morning.  She has been checking her BP at home. Slightly elevated with a good pulse. She will drink plenty of fluids.

## 2023-04-12 NOTE — Telephone Encounter (Signed)
Pt is scheduled for 03/2023

## 2023-04-13 ENCOUNTER — Ambulatory Visit (INDEPENDENT_AMBULATORY_CARE_PROVIDER_SITE_OTHER): Payer: Medicare Other | Admitting: Family Medicine

## 2023-04-13 ENCOUNTER — Encounter: Payer: Self-pay | Admitting: Family Medicine

## 2023-04-13 VITALS — BP 148/94 | HR 75 | Temp 98.0°F | Ht 59.0 in | Wt 113.4 lb

## 2023-04-13 DIAGNOSIS — R42 Dizziness and giddiness: Secondary | ICD-10-CM | POA: Diagnosis not present

## 2023-04-13 DIAGNOSIS — M150 Primary generalized (osteo)arthritis: Secondary | ICD-10-CM | POA: Diagnosis not present

## 2023-04-13 DIAGNOSIS — R030 Elevated blood-pressure reading, without diagnosis of hypertension: Secondary | ICD-10-CM

## 2023-04-13 DIAGNOSIS — H6593 Unspecified nonsuppurative otitis media, bilateral: Secondary | ICD-10-CM | POA: Diagnosis not present

## 2023-04-13 NOTE — Progress Notes (Signed)
Acute Office Visit  Subjective:     Patient ID: Alyssa Robinson, female    DOB: [birth year], 72 y.o.   MRN: [id number]  Chief Complaint  Patient presents with   Dizziness    Experiencing dizziness for the past 3 days. Worse on the first day, notes room felt that it was spinning. PT does try to make sure to get up slow in the morning. Took meclizine yesterday (prescribed to PT after a recent hip surgery)   Ear Fullness    Notes of left ear fullness and ringing for the past couple of weeks. Has had wax build up in left ear, "swishy" sounding.   Neck Pain    Notes of recent neck pain that has started with the other symptoms listed.    HPI Patient is in today for evaluation of dizziness. Has history of vertigo. States that she is moving her head around in the mornings before she gets up out of bed, states that she is also changing positions more slowly and is trying to be more aware of this. Has tried meclizine with good resolution. Denies increased dizziness going from lying to sitting or sitting to standing. States dizziness is worse when she is turning her head too fast. Sometimes checks blood pressures intermittently, reports 130s over 70s at home. Denies chest pain, shortness of breath, palpitation, numbness, tingling, other symptoms today. Medical history as outlined below  ROS Per HPI      Objective:    BP (!) 148/94   Pulse 75   Temp 98 F (36.7 C) (Oral)   Ht 4\' 11"  (1.499 m)   Wt 113 lb 6.4 oz (51.4 kg)   SpO2 96%   BMI 22.90 kg/m    Physical Exam Vitals and nursing note reviewed.  Constitutional:      Appearance: Normal appearance. She is normal weight.  HENT:     Head: Normocephalic and atraumatic.     Right Ear: Ear canal normal. A middle ear effusion is present.     Left Ear: Ear canal normal. A middle ear effusion is present.     Nose: Nose normal.  Eyes:     Extraocular Movements: Extraocular movements intact.     Pupils: Pupils are equal, round,  and reactive to light.  Cardiovascular:     Rate and Rhythm: Normal rate and regular rhythm.     Heart sounds: Normal heart sounds.  Pulmonary:     Effort: Pulmonary effort is normal.     Breath sounds: Normal breath sounds.  Musculoskeletal:        General: Normal range of motion.     Cervical back: Normal range of motion.  Neurological:     General: No focal deficit present.     Mental Status: She is alert and oriented to person, place, and time.  Psychiatric:        Mood and Affect: Mood normal.        Thought Content: Thought content normal.     No results found for any visits on 04/13/23.      Assessment & Plan:  1. Vertigo (Primary)  - edu attached to AVS - May continue meclizine as needed  2. Fluid level behind tympanic membrane of both ears  -Continue daily Claritin -Discussed how this could be contributing to dizziness  3. Primary osteoarthritis involving multiple joints  -May use ibuprofen or Aleve as needed.  Continue using ergonomic pillow at night  4.  Elevated blood pressure reading with  whitecoat syndrome, no diagnosis of hypertension  -Significant blood pressure decrease at the end of office visit -Discussed to monitor blood pressures at home, follow-up with me if they are 140/90 or greater consistently or if you are having symptoms including headaches, any swelling, decreased urinary output, chest pains, shortness of breath, increased fatigue, other concerning symptoms   No orders of the defined types were placed in this encounter.   Return if symptoms worsen or fail to improve.  Moshe Cipro, FNP

## 2023-04-13 NOTE — Patient Instructions (Signed)
May take claritin twice a day for the next 10 days.  If you are still having dizziness even with this, may switch out the evening dose of claritin for the meclizine.  Check BP at home once daily in the mornings for the next month. Follow up with me in a month to recheck blood pressures.  Follow-up with me for new or worsening symptoms.

## 2023-04-24 ENCOUNTER — Encounter: Payer: BLUE CROSS/BLUE SHIELD | Admitting: Physician Assistant

## 2023-05-07 DIAGNOSIS — Z9049 Acquired absence of other specified parts of digestive tract: Secondary | ICD-10-CM | POA: Diagnosis not present

## 2023-05-07 DIAGNOSIS — Z800 Family history of malignant neoplasm of digestive organs: Secondary | ICD-10-CM

## 2023-05-07 DIAGNOSIS — Z8261 Family history of arthritis: Secondary | ICD-10-CM | POA: Diagnosis not present

## 2023-05-07 DIAGNOSIS — Z7982 Long term (current) use of aspirin: Secondary | ICD-10-CM

## 2023-05-07 DIAGNOSIS — Z90710 Acquired absence of both cervix and uterus: Secondary | ICD-10-CM | POA: Diagnosis not present

## 2023-05-07 DIAGNOSIS — Z833 Family history of diabetes mellitus: Secondary | ICD-10-CM

## 2023-05-07 DIAGNOSIS — Z471 Aftercare following joint replacement surgery: Secondary | ICD-10-CM | POA: Diagnosis not present

## 2023-05-07 DIAGNOSIS — Z8041 Family history of malignant neoplasm of ovary: Secondary | ICD-10-CM

## 2023-05-07 DIAGNOSIS — K219 Gastro-esophageal reflux disease without esophagitis: Secondary | ICD-10-CM | POA: Diagnosis present

## 2023-05-07 DIAGNOSIS — Z96643 Presence of artificial hip joint, bilateral: Secondary | ICD-10-CM | POA: Diagnosis present

## 2023-05-07 DIAGNOSIS — Z803 Family history of malignant neoplasm of breast: Secondary | ICD-10-CM

## 2023-05-07 DIAGNOSIS — E860 Dehydration: Secondary | ICD-10-CM | POA: Diagnosis present

## 2023-05-07 DIAGNOSIS — Z79899 Other long term (current) drug therapy: Secondary | ICD-10-CM

## 2023-05-07 DIAGNOSIS — Z4682 Encounter for fitting and adjustment of non-vascular catheter: Secondary | ICD-10-CM | POA: Diagnosis not present

## 2023-05-07 DIAGNOSIS — Z887 Allergy status to serum and vaccine status: Secondary | ICD-10-CM | POA: Diagnosis not present

## 2023-05-07 DIAGNOSIS — K565 Intestinal adhesions [bands], unspecified as to partial versus complete obstruction: Principal | ICD-10-CM | POA: Diagnosis present

## 2023-05-07 DIAGNOSIS — K5669 Other partial intestinal obstruction: Secondary | ICD-10-CM | POA: Diagnosis not present

## 2023-05-07 DIAGNOSIS — I10 Essential (primary) hypertension: Secondary | ICD-10-CM | POA: Diagnosis present

## 2023-05-07 DIAGNOSIS — Z888 Allergy status to other drugs, medicaments and biological substances status: Secondary | ICD-10-CM | POA: Diagnosis not present

## 2023-05-07 DIAGNOSIS — E785 Hyperlipidemia, unspecified: Secondary | ICD-10-CM | POA: Diagnosis present

## 2023-05-07 DIAGNOSIS — Z818 Family history of other mental and behavioral disorders: Secondary | ICD-10-CM | POA: Diagnosis not present

## 2023-05-07 DIAGNOSIS — R110 Nausea: Secondary | ICD-10-CM | POA: Diagnosis present

## 2023-05-07 DIAGNOSIS — K56609 Unspecified intestinal obstruction, unspecified as to partial versus complete obstruction: Secondary | ICD-10-CM | POA: Diagnosis not present

## 2023-05-07 DIAGNOSIS — Z8249 Family history of ischemic heart disease and other diseases of the circulatory system: Secondary | ICD-10-CM

## 2023-05-07 DIAGNOSIS — K7689 Other specified diseases of liver: Secondary | ICD-10-CM | POA: Diagnosis not present

## 2023-05-07 DIAGNOSIS — K30 Functional dyspepsia: Secondary | ICD-10-CM | POA: Diagnosis not present

## 2023-05-07 DIAGNOSIS — K862 Cyst of pancreas: Secondary | ICD-10-CM | POA: Diagnosis not present

## 2023-05-08 ENCOUNTER — Inpatient Hospital Stay (HOSPITAL_COMMUNITY): Payer: Medicare Other

## 2023-05-08 ENCOUNTER — Encounter (HOSPITAL_COMMUNITY): Payer: Self-pay | Admitting: Emergency Medicine

## 2023-05-08 ENCOUNTER — Telehealth: Payer: Self-pay

## 2023-05-08 ENCOUNTER — Ambulatory Visit: Payer: Self-pay | Admitting: Internal Medicine

## 2023-05-08 ENCOUNTER — Inpatient Hospital Stay (HOSPITAL_COMMUNITY)
Admission: EM | Admit: 2023-05-08 | Discharge: 2023-05-11 | DRG: 390 | Disposition: A | Discharge status: Home / Self Care | Payer: Medicare Other | Attending: Family Medicine | Admitting: Family Medicine

## 2023-05-08 ENCOUNTER — Emergency Department (HOSPITAL_COMMUNITY): Payer: Medicare Other

## 2023-05-08 DIAGNOSIS — Z888 Allergy status to other drugs, medicaments and biological substances status: Secondary | ICD-10-CM | POA: Diagnosis not present

## 2023-05-08 DIAGNOSIS — Z8249 Family history of ischemic heart disease and other diseases of the circulatory system: Secondary | ICD-10-CM | POA: Diagnosis not present

## 2023-05-08 DIAGNOSIS — Z887 Allergy status to serum and vaccine status: Secondary | ICD-10-CM | POA: Diagnosis not present

## 2023-05-08 DIAGNOSIS — Z818 Family history of other mental and behavioral disorders: Secondary | ICD-10-CM | POA: Diagnosis not present

## 2023-05-08 DIAGNOSIS — K30 Functional dyspepsia: Secondary | ICD-10-CM | POA: Diagnosis not present

## 2023-05-08 DIAGNOSIS — K565 Intestinal adhesions [bands], unspecified as to partial versus complete obstruction: Secondary | ICD-10-CM | POA: Diagnosis present

## 2023-05-08 DIAGNOSIS — Z8261 Family history of arthritis: Secondary | ICD-10-CM | POA: Diagnosis not present

## 2023-05-08 DIAGNOSIS — K219 Gastro-esophageal reflux disease without esophagitis: Secondary | ICD-10-CM | POA: Diagnosis present

## 2023-05-08 DIAGNOSIS — K56609 Unspecified intestinal obstruction, unspecified as to partial versus complete obstruction: Secondary | ICD-10-CM | POA: Diagnosis not present

## 2023-05-08 DIAGNOSIS — Z471 Aftercare following joint replacement surgery: Secondary | ICD-10-CM | POA: Diagnosis not present

## 2023-05-08 DIAGNOSIS — Z800 Family history of malignant neoplasm of digestive organs: Secondary | ICD-10-CM | POA: Diagnosis not present

## 2023-05-08 DIAGNOSIS — Z7982 Long term (current) use of aspirin: Secondary | ICD-10-CM | POA: Diagnosis not present

## 2023-05-08 DIAGNOSIS — Z96643 Presence of artificial hip joint, bilateral: Secondary | ICD-10-CM | POA: Diagnosis present

## 2023-05-08 DIAGNOSIS — Z79899 Other long term (current) drug therapy: Secondary | ICD-10-CM | POA: Diagnosis not present

## 2023-05-08 DIAGNOSIS — Z90710 Acquired absence of both cervix and uterus: Secondary | ICD-10-CM | POA: Diagnosis not present

## 2023-05-08 DIAGNOSIS — R110 Nausea: Secondary | ICD-10-CM | POA: Diagnosis present

## 2023-05-08 DIAGNOSIS — Z8041 Family history of malignant neoplasm of ovary: Secondary | ICD-10-CM | POA: Diagnosis not present

## 2023-05-08 DIAGNOSIS — Z833 Family history of diabetes mellitus: Secondary | ICD-10-CM | POA: Diagnosis not present

## 2023-05-08 DIAGNOSIS — E785 Hyperlipidemia, unspecified: Secondary | ICD-10-CM | POA: Diagnosis present

## 2023-05-08 DIAGNOSIS — K5669 Other partial intestinal obstruction: Secondary | ICD-10-CM | POA: Diagnosis not present

## 2023-05-08 DIAGNOSIS — Z9049 Acquired absence of other specified parts of digestive tract: Secondary | ICD-10-CM | POA: Diagnosis not present

## 2023-05-08 DIAGNOSIS — I10 Essential (primary) hypertension: Secondary | ICD-10-CM | POA: Diagnosis present

## 2023-05-08 DIAGNOSIS — E860 Dehydration: Secondary | ICD-10-CM | POA: Diagnosis present

## 2023-05-08 DIAGNOSIS — K862 Cyst of pancreas: Secondary | ICD-10-CM | POA: Diagnosis not present

## 2023-05-08 DIAGNOSIS — K7689 Other specified diseases of liver: Secondary | ICD-10-CM | POA: Diagnosis not present

## 2023-05-08 DIAGNOSIS — Z4682 Encounter for fitting and adjustment of non-vascular catheter: Secondary | ICD-10-CM | POA: Diagnosis not present

## 2023-05-08 DIAGNOSIS — Z803 Family history of malignant neoplasm of breast: Secondary | ICD-10-CM | POA: Diagnosis not present

## 2023-05-08 LAB — URINALYSIS, ROUTINE W REFLEX MICROSCOPIC
Bilirubin Urine: NEGATIVE
Glucose, UA: NEGATIVE mg/dL
Hgb urine dipstick: NEGATIVE
Ketones, ur: 20 mg/dL — AB
Leukocytes,Ua: NEGATIVE
Nitrite: NEGATIVE
Protein, ur: NEGATIVE mg/dL
Specific Gravity, Urine: 1.012 (ref 1.005–1.030)
pH: 8 (ref 5.0–8.0)

## 2023-05-08 LAB — LIPASE, BLOOD: Lipase: 32 U/L (ref 11–51)

## 2023-05-08 LAB — COMPREHENSIVE METABOLIC PANEL
ALT: 17 U/L (ref 0–44)
AST: 27 U/L (ref 15–41)
Albumin: 4.5 g/dL (ref 3.5–5.0)
Alkaline Phosphatase: 102 U/L (ref 38–126)
Anion gap: 12 (ref 5–15)
BUN: 21 mg/dL (ref 8–23)
CO2: 26 mmol/L (ref 22–32)
Calcium: 9.7 mg/dL (ref 8.9–10.3)
Chloride: 101 mmol/L (ref 98–111)
Creatinine, Ser: 0.85 mg/dL (ref 0.44–1.00)
GFR, Estimated: >60 mL/min (ref >60)
Glucose, Bld: 170 mg/dL — ABNORMAL HIGH (ref 70–99)
Potassium: 4.5 mmol/L (ref 3.5–5.1)
Sodium: 139 mmol/L (ref 135–145)
Total Bilirubin: 0.9 mg/dL (ref 0.0–1.2)
Total Protein: 7.6 g/dL (ref 6.5–8.1)

## 2023-05-08 LAB — CBC
HCT: 47.2 % — ABNORMAL HIGH (ref 36.0–46.0)
Hemoglobin: 15.6 g/dL — ABNORMAL HIGH (ref 12.0–15.0)
MCH: 31.9 pg (ref 26.0–34.0)
MCHC: 33.1 g/dL (ref 30.0–36.0)
MCV: 96.5 fL (ref 80.0–100.0)
Platelets: 191 10*3/uL (ref 150–400)
RBC: 4.89 MIL/uL (ref 3.87–5.11)
RDW: 13.9 % (ref 11.5–15.5)
WBC: 9.8 10*3/uL (ref 4.0–10.5)
nRBC: 0 % (ref 0.0–0.2)

## 2023-05-08 LAB — LACTIC ACID, PLASMA: Lactic Acid, Venous: 1.6 mmol/L (ref 0.5–1.9)

## 2023-05-08 MED ORDER — ENOXAPARIN SODIUM 40 MG/0.4ML IJ SOSY
40.0000 mg | PREFILLED_SYRINGE | INTRAMUSCULAR | Status: DC
  Administered 2023-05-08 – 2023-05-10 (×3): 40 mg via SUBCUTANEOUS
  Filled 2023-05-08 (×3): qty 0.4

## 2023-05-08 MED ORDER — KCL IN DEXTROSE-NACL 20-5-0.45 MEQ/L-%-% IV SOLN
INTRAVENOUS | Status: AC
  Filled 2023-05-08 (×2): qty 1000

## 2023-05-08 MED ORDER — ONDANSETRON HCL 4 MG/2ML IJ SOLN
4.0000 mg | Freq: Four times a day (QID) | INTRAMUSCULAR | Status: DC | PRN
Start: 2023-05-08 — End: 2023-05-11
  Administered 2023-05-08: 4 mg via INTRAVENOUS
  Filled 2023-05-08: qty 2

## 2023-05-08 MED ORDER — ACETAMINOPHEN 325 MG PO TABS
650.0000 mg | ORAL_TABLET | Freq: Four times a day (QID) | ORAL | Status: DC | PRN
  Administered 2023-05-09: 650 mg via ORAL
  Filled 2023-05-08: qty 2

## 2023-05-08 MED ORDER — FENTANYL CITRATE PF 50 MCG/ML IJ SOSY: 12.5000 ug | PREFILLED_SYRINGE | INTRAMUSCULAR | Status: DC | PRN

## 2023-05-08 MED ORDER — ONDANSETRON HCL 4 MG PO TABS: 4.0000 mg | ORAL_TABLET | Freq: Four times a day (QID) | ORAL | Status: DC | PRN

## 2023-05-08 MED ORDER — PHENOL 1.4 % MT LIQD
1.0000 | OROMUCOSAL | Status: DC | PRN
  Administered 2023-05-09: 1 via OROMUCOSAL
  Filled 2023-05-08: qty 177

## 2023-05-08 MED ORDER — PANTOPRAZOLE SODIUM 40 MG IV SOLR
40.0000 mg | Freq: Two times a day (BID) | INTRAVENOUS | Status: DC
  Administered 2023-05-08 – 2023-05-11 (×6): 40 mg via INTRAVENOUS
  Filled 2023-05-08 (×6): qty 10

## 2023-05-08 MED ORDER — HYDRALAZINE HCL 20 MG/ML IJ SOLN: 5.0000 mg | Freq: Four times a day (QID) | INTRAMUSCULAR | Status: DC | PRN

## 2023-05-08 MED ORDER — ONDANSETRON 4 MG PO TBDP
4.0000 mg | ORAL_TABLET | Freq: Once | ORAL | Status: AC | PRN
  Administered 2023-05-08: 4 mg via ORAL
  Filled 2023-05-08: qty 1

## 2023-05-08 MED ORDER — ACETAMINOPHEN 650 MG RE SUPP: 650.0000 mg | Freq: Four times a day (QID) | RECTAL | Status: DC | PRN

## 2023-05-08 MED ORDER — LIDOCAINE VISCOUS HCL 2 % MT SOLN
15.0000 mL | OROMUCOSAL | Status: DC | PRN
  Administered 2023-05-08: 15 mL via OROMUCOSAL
  Filled 2023-05-08: qty 15

## 2023-05-08 MED ORDER — SODIUM CHLORIDE 0.9 % IV SOLN: INTRAVENOUS | Status: DC

## 2023-05-08 MED ORDER — DIATRIZOATE MEGLUMINE & SODIUM 66-10 % PO SOLN
90.0000 mL | Freq: Once | ORAL | Status: AC
  Administered 2023-05-08: 90 mL via NASOGASTRIC
  Filled 2023-05-08: qty 90

## 2023-05-08 MED ORDER — IOHEXOL 350 MG/ML SOLN
75.0000 mL | Freq: Once | INTRAVENOUS | Status: AC | PRN
  Administered 2023-05-08: 75 mL via INTRAVENOUS

## 2023-05-08 NOTE — Telephone Encounter (Signed)
 Have patient schedule a visit or virtual visit

## 2023-05-08 NOTE — ED Triage Notes (Signed)
 Pt has hx of perf and obstruction 11 years ago. Acute onset of nausea and belly cramping tonight after dinner.

## 2023-05-08 NOTE — ED Notes (Signed)
 Pt given emesis bag, pt was vomiting.

## 2023-05-08 NOTE — Consult Note (Signed)
 CC/Reason for consult: SBO Requesting physician: Dr. Roxie Bough  HPI: Alyssa Robinson is an 73 y.o. female with hx of vertigo, prior left colectomy/colostomy with Dr. Kimble 2014 (perforated sigmoid) followed by colostomy takedown later that year presented to ED with n/v around midnight today. She underwent workup and was found to have small bowel obstruction. We are now asked to see this evening  She reports not abdominal pain. Symptoms began last night 30-60 minutes after eating dinner. Does have some distention. She has had 2 episodes of emesis today. Denies flatus/BM since symptom onset. Denies history of similar episodes before  She is here with her husband. They lead active lives.  Past Medical History:  Diagnosis Date   Allergy 20-25 years ago   Anemia    hx   Arthritis 8-10 years ago   Blood transfusion without reported diagnosis 20 years ago   Bronchitis    Bronchitis    hx   Closed patellar sleeve fracture of left knee    Diverticulosis    GERD (gastroesophageal reflux disease)    occ   Headache(784.0)    migraines occ   Hypertension    no meds in over 44yrs   Pneumonia    Seasonal allergies     Past Surgical History:  Procedure Laterality Date   ABDOMINAL HYSTERECTOMY     73 y/o   APPENDECTOMY  03/17/2013   Procedure: INCIDENTAL APPENDECTOMY;  Surgeon: Lynwood MALVA Kimble, MD;  Location: [facility] OR;  Service: General;;   capsule endoscopy     COLON SURGERY     COLONOSCOPY     in 2012 -    COLOSTOMY CLOSURE  03/17/2013   COLOSTOMY REVERSAL  03/17/2013   Procedure: COLOSTOMY REVERSAL;  Surgeon: Lynwood MALVA Kimble, MD;  Location: [facility] OR;  Service: General;;   EUS N/A 04/30/2015   Procedure: UPPER ENDOSCOPIC ULTRASOUND (EUS) LINEAR;  Surgeon: Belvie Just, MD;  Location: [facility];  Service: Endoscopy;  Laterality: N/A;   HERNIA REPAIR     X2 2-4 y/o inguinal   JOINT REPLACEMENT  ORID Knee/right hip   2021/2022   LAPAROTOMY N/A 10/17/2012   Procedure: EXPLORATORY  LAPAROTOMY,  COLON RESECTION AND COLOSTOMY;  Surgeon: Lynwood MALVA Kimble, MD;  Location: [facility] OR;  Service: General;  Laterality: N/A;   LIPOMA EXCISION Left    left arm   LYSIS OF ADHESION  03/17/2013   Procedure: LYSIS OF ADHESION;  Surgeon: Lynwood MALVA Kimble, MD;  Location: [facility] OR;  Service: General;;   ORIF PATELLA Left 02/20/2020   Procedure: OPEN REDUCTION INTERNAL (ORIF) FIXATION LEFT PATELLA;  Surgeon: Jerri Kay HERO, MD;  Location: [facility];  Service: Orthopedics;  Laterality: Left;   SMALL INTESTINE SURGERY  Perforated colon   2014   TOTAL HIP ARTHROPLASTY Right 09/12/2021   Procedure: RIGHT TOTAL HIP ARTHROPLASTY ANTERIOR APPROACH;  Surgeon: Jerri Kay HERO, MD;  Location: [facility] OR;  Service: Orthopedics;  Laterality: Right;  3-C   TOTAL HIP ARTHROPLASTY Left 02/23/2023   Procedure: LEFT TOTAL HIP ARTHROPLASTY ANTERIOR APPROACH;  Surgeon: Jerri Kay HERO, MD;  Location: [facility] OR;  Service: Orthopedics;  Laterality: Left;  3-C   UPPER GASTROINTESTINAL ENDOSCOPY     2013 -    Family History  Problem Relation Age of Onset   Esophageal cancer Mother        and ovarian cancer   Alcohol abuse Mother    Arthritis Mother    Cancer Mother    Depression Mother  Heart disease Father    Alcohol abuse Father    Hypertension Father    Diabetes Father    Early death Father    Breast cancer Sister    Colon cancer Other    Pancreatic cancer Other    Cancer Maternal Aunt    Cancer Maternal Aunt    Stroke Neg Hx    Hyperlipidemia Neg Hx    Kidney disease Neg Hx    COPD Neg Hx     Social:  reports that she has never smoked. She has never used smokeless tobacco. She reports that she does not currently use alcohol after a past usage of about 2.0 standard drinks of alcohol per week. She reports that she does not use drugs.  Allergies:  Allergies  Allergen Reactions   Caffeine Nausea And Vomiting and Palpitations    Makes heart race    Fluad Quadrivalent [Influenza Vac A&B Sa Adj Quad]  Nausea And Vomiting    Pt allergic to the 65 and over version of the flu vaccine, tolerates regular flu vaccine    Sodium Lauryl Sulfate Rash    Swollen gums     Medications: I have reviewed the patient's current medications.  Results for orders placed or performed during the hospital encounter of 05/08/23 (from the past 48 hours)  Lipase, blood     Status: None   Collection Time: 05/08/23 12:37 AM  Result Value Ref Range   Lipase 32 11 - 51 U/L    Comment: Performed at [facility], [address] [place], [place] [address]  Comprehensive metabolic panel     Status: Abnormal   Collection Time: 05/08/23 12:37 AM  Result Value Ref Range   Sodium 139 135 - 145 mmol/L   Potassium 4.5 3.5 - 5.1 mmol/L   Chloride 101 98 - 111 mmol/L   CO2 26 22 - 32 mmol/L   Glucose, Bld 170 (H) 70 - 99 mg/dL    Comment: Glucose reference range applies only to samples taken after fasting for at least 8 hours.   BUN 21 8 - 23 mg/dL   Creatinine, Ser 9.14 0.44 - 1.00 mg/dL   Calcium  9.7 8.9 - 10.3 mg/dL   Total Protein 7.6 6.5 - 8.1 g/dL   Albumin  4.5 3.5 - 5.0 g/dL   AST 27 15 - 41 U/L   ALT 17 0 - 44 U/L   Alkaline Phosphatase 102 38 - 126 U/L   Total Bilirubin 0.9 0.0 - 1.2 mg/dL   GFR, Estimated >39 >39 mL/min    Comment: (NOTE) Calculated using the CKD-EPI Creatinine Equation (2021)    Anion gap 12 5 - 15    Comment: Performed at [facility], [address] [place], [place] [address]  CBC     Status: Abnormal   Collection Time: 05/08/23 12:37 AM  Result Value Ref Range   WBC 9.8 4.0 - 10.5 K/uL   RBC 4.89 3.87 - 5.11 MIL/uL   Hemoglobin 15.6 (H) 12.0 - 15.0 g/dL   HCT 52.7 (H) 63.9 - 53.9 %   MCV 96.5 80.0 - 100.0 fL   MCH 31.9 26.0 - 34.0 pg   MCHC 33.1 30.0 - 36.0 g/dL   RDW 86.0 88.4 - 84.4 %   Platelets 191 150 - 400 K/uL   nRBC 0.0 0.0 - 0.2 %    Comment: Performed at [facility], [address], [place], [place] [address]  Urinalysis, Routine w reflex  microscopic -Urine, Clean Catch     Status: Abnormal   Collection Time: 05/08/23 12:40 AM  Result Value Ref Range   Color, Urine YELLOW YELLOW   APPearance HAZY (A) CLEAR   Specific Gravity, Urine 1.012 1.005 - 1.030   pH 8.0 5.0 - 8.0   Glucose, UA NEGATIVE NEGATIVE mg/dL   Hgb urine dipstick NEGATIVE NEGATIVE   Bilirubin Urine NEGATIVE NEGATIVE   Ketones, ur 20 (A) NEGATIVE mg/dL   Protein, ur NEGATIVE NEGATIVE mg/dL   Nitrite NEGATIVE NEGATIVE   Leukocytes,Ua NEGATIVE NEGATIVE    Comment: Performed at [facility], [address] [place], [place] [address]    CT ABDOMEN PELVIS W CONTRAST Result Date: 05/08/2023 CLINICAL DATA:  Abdominal pain EXAM: CT ABDOMEN AND PELVIS WITH CONTRAST TECHNIQUE: Multidetector CT imaging of the abdomen and pelvis was performed using the standard protocol following bolus administration of intravenous contrast. RADIATION DOSE REDUCTION: This exam was performed according to the departmental dose-optimization program which includes automated exposure control, adjustment of the mA and/or kV according to patient size and/or use of iterative reconstruction technique. CONTRAST:  75mL OMNIPAQUE  IOHEXOL  350 MG/ML SOLN COMPARISON:  MRI abdomen dated 06/11/2022. CT abdomen/pelvis dated 03/16/2014. FINDINGS: Lower chest: Mild scarring/atelectasis in the bilateral lower lungs. Hepatobiliary: 13 mm cyst in the posterior right hepatic lobe (series 2/image 19), benign. Gallbladder is unremarkable. No intrahepatic or extrahepatic ductal dilatation. 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 Given long-term stability dating back to at least 2016, no follow-up is recommended. No pancreatic atrophy or ductal dilatation. Spleen: Within normal limits. Adrenals/Urinary Tract: Adrenal glands are within normal limits. Kidneys are within normal limits.  No hydronephrosis. Bladder is underdistended and poorly  evaluated due to streak artifact. Stomach/Bowel: Stomach is within normal limits. Dilated loops of small bowel in the right mid abdomen (series 2/image 35) with very mild central mesenteric swirling (series 2/image 42). This appearance favors mild small bowel obstruction, possibly on the basis of internal hernia, although equivocal. Appendix is not discretely visualized. Status post left hemicolectomy with suture line in the lower pelvis (series 2/image 5). No colonic wall thickening or inflammatory changes. Vascular/Lymphatic: No evidence of abdominal aortic aneurysm. No suspicious abdominopelvic lymphadenopathy. Reproductive: Suspected prior hysterectomy, although poorly evaluated due to streak artifact. No adnexal masses. Other: No abdominopelvic ascites.  No free air. Musculoskeletal: Degenerative changes of the visualized thoracolumbar spine. Bilateral hip arthroplasties. IMPRESSION: Dilated loops of small bowel in the right mid abdomen, favoring mild small-bowel obstruction, possibly on the basis of internal hernia, although equivocal. Status post left hemicolectomy. Additional ancillary findings as above. Electronically Signed   By: Pinkie Pebbles M.D.   On: 05/08/2023 03:11    ROS - all of the below systems have been reviewed with the patient and positives are indicated with bold text General: chills, fever or night sweats Eyes: blurry vision or double vision ENT: epistaxis or sore throat Allergy/Immunology: itchy/watery eyes or nasal congestion Hematologic/Lymphatic: bleeding problems, blood clots or swollen lymph nodes Endocrine: temperature intolerance or unexpected weight changes Breast: new or changing breast lumps or nipple discharge Resp: cough, shortness of breath, or wheezing CV: chest pain or dyspnea on exertion GI: as per HPI GU: dysuria, trouble voiding, or hematuria MSK: joint pain or joint stiffness Neuro: TIA or stroke symptoms Derm: pruritus and skin lesion changes Psych:  anxiety and depression  PE Blood pressure (!) 158/89, pulse 79, temperature 98 F (36.7  C), temperature source Oral, resp. rate 18, SpO2 100%. Constitutional: NAD; conversant; no deformities Eyes: Moist conjunctiva; no lid lag; anicteric; PERRL Neck: Trachea midline; no thyromegaly Lungs: Normal respiratory effort; no tactile fremitus CV: RRR; no palpable thrills; no pitting edema GI: Abd soft, nontender throughout; mildly to moderately distended; no palpable hepatosplenomegaly MSK: Normal range of motion of extremities Psychiatric: Appropriate affect; alert and oriented x3 Lymphatic: No palpable cervical or axillary lymphadenopathy  Results for orders placed or performed during the hospital encounter of 05/08/23 (from the past 48 hours)  Lipase, blood     Status: None   Collection Time: 05/08/23 12:37 AM  Result Value Ref Range   Lipase 32 11 - 51 U/L    Comment: Performed at [facility], [address], [place], [place] [address]  Comprehensive metabolic panel     Status: Abnormal   Collection Time: 05/08/23 12:37 AM  Result Value Ref Range   Sodium 139 135 - 145 mmol/L   Potassium 4.5 3.5 - 5.1 mmol/L   Chloride 101 98 - 111 mmol/L   CO2 26 22 - 32 mmol/L   Glucose, Bld 170 (H) 70 - 99 mg/dL    Comment: Glucose reference range applies only to samples taken after fasting for at least 8 hours.   BUN 21 8 - 23 mg/dL   Creatinine, Ser 9.14 0.44 - 1.00 mg/dL   Calcium  9.7 8.9 - 10.3 mg/dL   Total Protein 7.6 6.5 - 8.1 g/dL   Albumin  4.5 3.5 - 5.0 g/dL   AST 27 15 - 41 U/L   ALT 17 0 - 44 U/L   Alkaline Phosphatase 102 38 - 126 U/L   Total Bilirubin 0.9 0.0 - 1.2 mg/dL   GFR, Estimated >39 >39 mL/min    Comment: (NOTE) Calculated using the CKD-EPI Creatinine Equation (2021)    Anion gap 12 5 - 15    Comment: Performed at [facility], [address], [place], [place] [address]  CBC     Status: Abnormal   Collection Time: 05/08/23 12:37 AM  Result Value Ref  Range   WBC 9.8 4.0 - 10.5 K/uL   RBC 4.89 3.87 - 5.11 MIL/uL   Hemoglobin 15.6 (H) 12.0 - 15.0 g/dL   HCT 52.7 (H) 63.9 - 53.9 %   MCV 96.5 80.0 - 100.0 fL   MCH 31.9 26.0 - 34.0 pg   MCHC 33.1 30.0 - 36.0 g/dL   RDW 86.0 88.4 - 84.4 %   Platelets 191 150 - 400 K/uL   nRBC 0.0 0.0 - 0.2 %    Comment: Performed at [facility], [address] [place], [place] [address]  Urinalysis, Routine w reflex microscopic -Urine, Clean Catch     Status: Abnormal   Collection Time: 05/08/23 12:40 AM  Result Value Ref Range   Color, Urine YELLOW YELLOW   APPearance HAZY (A) CLEAR   Specific Gravity, Urine 1.012 1.005 - 1.030   pH 8.0 5.0 - 8.0   Glucose, UA NEGATIVE NEGATIVE mg/dL   Hgb urine dipstick NEGATIVE NEGATIVE   Bilirubin Urine NEGATIVE NEGATIVE   Ketones, ur 20 (A) NEGATIVE mg/dL   Protein, ur NEGATIVE NEGATIVE mg/dL   Nitrite NEGATIVE NEGATIVE   Leukocytes,Ua NEGATIVE NEGATIVE    Comment: Performed at [facility], [address] [place], [place] [address]    CT ABDOMEN PELVIS W CONTRAST Result Date: 05/08/2023 CLINICAL DATA:  Abdominal pain EXAM: CT ABDOMEN AND  PELVIS WITH CONTRAST TECHNIQUE: Multidetector CT imaging of the abdomen and pelvis was performed using the standard protocol following bolus administration of intravenous contrast. RADIATION DOSE REDUCTION: This exam was performed according to the departmental dose-optimization program which includes automated exposure control, adjustment of the mA and/or kV according to patient size and/or use of iterative reconstruction technique. CONTRAST:  75mL OMNIPAQUE  IOHEXOL  350 MG/ML SOLN COMPARISON:  MRI abdomen dated 06/11/2022. CT abdomen/pelvis dated 03/16/2014. FINDINGS: Lower chest: Mild scarring/atelectasis in the bilateral lower lungs. Hepatobiliary: 13 mm cyst in the posterior right hepatic lobe (series 2/image 19), benign. Gallbladder is unremarkable. No intrahepatic or extrahepatic ductal dilatation. 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 Given long-term stability dating back to at least 2016, no follow-up is recommended. No pancreatic atrophy or ductal dilatation. Spleen: Within normal limits. Adrenals/Urinary Tract: Adrenal glands are within normal limits. Kidneys are within normal limits.  No hydronephrosis. Bladder is underdistended and poorly evaluated due to streak artifact. Stomach/Bowel: Stomach is within normal limits. Dilated loops of small bowel in the right mid abdomen (series 2/image 35) with very mild central mesenteric swirling (series 2/image 42). This appearance favors mild small bowel obstruction, possibly on the basis of internal hernia, although equivocal. Appendix is not discretely visualized. Status post left hemicolectomy with suture line in the lower pelvis (series 2/image 5). No colonic wall thickening or inflammatory changes. Vascular/Lymphatic: No evidence of abdominal aortic aneurysm. No suspicious abdominopelvic lymphadenopathy. Reproductive: Suspected prior hysterectomy, although poorly evaluated due to streak artifact. No adnexal masses. Other: No abdominopelvic ascites.  No free air. Musculoskeletal: Degenerative changes of the visualized thoracolumbar spine. Bilateral hip arthroplasties. IMPRESSION: Dilated loops of small bowel in the right mid abdomen, favoring mild small-bowel obstruction, possibly on the basis of internal hernia, although equivocal. Status post left hemicolectomy. Additional ancillary findings as above. Electronically Signed   By: Pinkie Pebbles M.D.   On: 05/08/2023 03:11    A/P: Alyssa Robinson is an 73 y.o. female with small bowel obstruction  AF VS normal WBC 9.8 Completely benign abdominal exam  Reviewed CT scan - she does have hx of left colectomy and colostomy takedown - I see no bowel lateral to her mesocolon at least on the left site to suggest internal hernia. Transverse  colon does come down over much of her abdomen but bowel underneath transverse and not mesocolon.  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 We discussed general management strategies and plans. Reviewed scenarios where surgery may be necessary but at present, discussed NPO, MIVF, NG tube to low intermittent wall suction and gastrograffin protocol for SBO - We will follow with you   Lonni Pizza, MD [facility]

## 2023-05-08 NOTE — Telephone Encounter (Signed)
 Called patient. She is currently in the ED at [facility]. She has been there for many hours and still waiting to speak with a doctor about the CT findings. I gave her the name of GI physician that Dr.Xu recommended.

## 2023-05-08 NOTE — Telephone Encounter (Signed)
 Patient called wanting to know if Dr. Roda Shutters could recommend a doctor within [facility] that has good expertise in Obstruction.  Cb# [phone].  Please advise.  Thank you.

## 2023-05-08 NOTE — Telephone Encounter (Signed)
 Copied from CRM [id number]. Topic: Clinical - Red Word Triage >> May 08, 2023 11:56 AM Robinson DEL wrote: Red Word that prompted transfer to Nurse Triage: Patient states she's at the emergency room for severe stomach pain and still having pain and vomiting.   Chief Complaint: Requesting Advice Symptoms: abdominal pain  Additional Notes: Patient stated she is currently in the hospital due to abdominal pain. She mentioned that she had blood work and a CT scan so far. She stated that a staff member informed her that she may have some type of blockage. Patient does not know for sure if she is going to need surgery, but she is asking if Dr. Rollene can recommend someone if she does end up needing to have surgery. This RN advised patient to continue waiting at the hospital to receive a full evaluation and the staff there will let her know about the findings and her plan of care, and she can follow up with PCP after discharge. Patient would still like Dr. Rollene to be notified so that she can review her chart and her imaging results. Please let patient know if Dr. Rollene has any recommendations.  Reason for Disposition  Health Information question, no triage required and triager able to answer question  Answer Assessment - Initial Assessment Questions 1. REASON FOR CALL or QUESTION: What is your reason for calling today? or How can I best help you? or What question do you have that I can help answer?     Patient is currently in the hospital and requesting provider recommendation.  Protocols used: Information Only Call - No Triage-A-AH

## 2023-05-08 NOTE — ED Provider Notes (Signed)
 [place] [facility] Provider Note   CSN: [id number] Arrival date & time: 05/07/23  2148     History  Chief Complaint  Patient presents with   Abdominal Pain    Alyssa Robinson is a 73 y.o. female.  HPI   73 year old female presents emergency department with abdominal pain and concern for obstruction.  Back in 2014 patient had a ruptured colon with the surgery that involves colectomy by Dr. Kimble.  Since then she has had no abdominal surgeries and has been doing well.  Until Sunday night when she started feeling generally unwell with some mild abdominal pain.  Her last bowel movement was Monday morning and since then she has not been passing gas.  She is complaining of a bloating pain in her general abdomen as well as nausea with a couple episodes of vomiting.  Denies any fever but has endorsed chills.  Home Medications Prior to Admission medications   Medication Sig Start Date End Date Taking? Authorizing Provider  aspirin  EC 81 MG tablet Take 1 tablet (81 mg total) by mouth 2 (two) times daily. To be taken after surgery to prevent blood clots 02/13/23 02/13/24  Jule Ronal CROME, PA-C  loratadine (CLARITIN) 10 MG tablet Take 10 mg by mouth daily.    [provider]  meclizine (ANTIVERT) 12.5 MG tablet Take 12.5 mg by mouth 3 (three) times daily as needed for dizziness.    [provider]  Pediatric Multiple Vit-C-FA (PEDIATRIC MULTIVITAMIN) chewable tablet Chew 1 tablet by mouth daily. [place]    [provider]  Probiotic Product (ULTRAFLORA IMMUNE HEALTH PO) Take 1 capsule by mouth daily.    [provider]  sodium chloride  (OCEAN) 0.65 % SOLN nasal spray Place 1 spray into both nostrils as needed for congestion.    [provider]      Allergies    Caffeine, Fluad quadrivalent [influenza vac a&b sa adj quad], and Sodium lauryl sulfate    Review of Systems   Review of Systems  Constitutional:   Positive for chills and fatigue. Negative for fever.  Respiratory:  Negative for shortness of breath.   Cardiovascular:  Negative for chest pain.  Gastrointestinal:  Positive for abdominal distention, abdominal pain, nausea and vomiting. Negative for blood in stool and diarrhea.  Skin:  Negative for rash.  Neurological:  Negative for headaches.    Physical Exam Updated Vital Signs BP (!) 158/89   Pulse 79   Temp 98 F (36.7 C) (Oral)   Resp 18   SpO2 100%  Physical Exam Vitals and nursing note reviewed.  Constitutional:      General: She is not in acute distress.    Appearance: Normal appearance.  HENT:     Head: Normocephalic.     Mouth/Throat:     Mouth: Mucous membranes are moist.  Cardiovascular:     Rate and Rhythm: Normal rate.  Pulmonary:     Effort: Pulmonary effort is normal. No respiratory distress.  Abdominal:     General: Bowel sounds are decreased. There is distension.     Palpations: Abdomen is soft.     Tenderness: There is generalized abdominal tenderness. There is no guarding or rebound.     Hernia: No hernia is present.     Comments: Large midline abdominal scar  Skin:    General: Skin is warm.  Neurological:     Mental Status: She is alert and oriented to person, place, and time. Mental  status is at baseline.  Psychiatric:        Mood and Affect: Mood normal.     ED Results / Procedures / Treatments   Labs (all labs ordered are listed, but only abnormal results are displayed) Labs Reviewed  COMPREHENSIVE METABOLIC PANEL - Abnormal; Notable for the following components:      Result Value   Glucose, Bld 170 (*)    All other components within normal limits  CBC - Abnormal; Notable for the following components:   Hemoglobin 15.6 (*)    HCT 47.2 (*)    All other components within normal limits  URINALYSIS, ROUTINE W REFLEX MICROSCOPIC - Abnormal; Notable for the following components:   APPearance HAZY (*)    Ketones, ur 20 (*)    All other  components within normal limits  LIPASE, BLOOD  LACTIC ACID, PLASMA  LACTIC ACID, PLASMA    EKG None  Radiology CT ABDOMEN PELVIS W CONTRAST Result Date: 05/08/2023 CLINICAL DATA:  Abdominal pain EXAM: CT ABDOMEN AND PELVIS WITH CONTRAST TECHNIQUE: Multidetector CT imaging of the abdomen and pelvis was performed using the standard protocol following bolus administration of intravenous contrast. RADIATION DOSE REDUCTION: This exam was performed according to the departmental dose-optimization program which includes automated exposure control, adjustment of the mA and/or kV according to patient size and/or use of iterative reconstruction technique. CONTRAST:  75mL OMNIPAQUE  IOHEXOL  350 MG/ML SOLN COMPARISON:  MRI abdomen dated 06/11/2022. CT abdomen/pelvis dated 03/16/2014. FINDINGS: Lower chest: Mild scarring/atelectasis in the bilateral lower lungs. Hepatobiliary: 13 mm cyst in the posterior right hepatic lobe (series 2/image 19), benign. Gallbladder is unremarkable. No intrahepatic or extrahepatic ductal dilatation. 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 Given long-term stability dating back to at least 2016, no follow-up is recommended. No pancreatic atrophy or ductal dilatation. Spleen: Within normal limits. Adrenals/Urinary Tract: Adrenal glands are within normal limits. Kidneys are within normal limits.  No hydronephrosis. Bladder is underdistended and poorly evaluated due to streak artifact. Stomach/Bowel: Stomach is within normal limits. Dilated loops of small bowel in the right mid abdomen (series 2/image 35) with very mild central mesenteric swirling (series 2/image 42). This appearance favors mild small bowel obstruction, possibly on the basis of internal hernia, although equivocal. Appendix is not discretely visualized. Status post left hemicolectomy with suture line in the lower pelvis (series 2/image 5). No  colonic wall thickening or inflammatory changes. Vascular/Lymphatic: No evidence of abdominal aortic aneurysm. No suspicious abdominopelvic lymphadenopathy. Reproductive: Suspected prior hysterectomy, although poorly evaluated due to streak artifact. No adnexal masses. Other: No abdominopelvic ascites.  No free air. Musculoskeletal: Degenerative changes of the visualized thoracolumbar spine. Bilateral hip arthroplasties. IMPRESSION: Dilated loops of small bowel in the right mid abdomen, favoring mild small-bowel obstruction, possibly on the basis of internal hernia, although equivocal. Status post left hemicolectomy. Additional ancillary findings as above. Electronically Signed   By: Pinkie Pebbles M.D.   On: 05/08/2023 03:11    Procedures Procedures    Medications Ordered in ED Medications  0.9 %  sodium chloride  infusion (has no administration in time range)  ondansetron  (ZOFRAN -ODT) disintegrating tablet 4 mg (4 mg Oral Given 05/08/23 0040)  iohexol  (OMNIPAQUE ) 350 MG/ML injection 75 mL (75 mLs Intravenous Contrast Given 05/08/23 0214)    ED Course/ Medical Decision Making/ A&P  Medical Decision Making Amount and/or Complexity of Data Reviewed Labs: ordered.  Risk Prescription drug management.   73 year old female presents emergency department with abdominal bloating, decreased bowel movements, no longer passing gas.  Last bowel movement was Monday morning, prominent abdominal surgery history in 2014 after ruptured colon, with colectomy.  Vital signs are stable on arrival.  Blood work is reassuring, abdominal exam reveals a large midline scar, slight distention, general discomfort, but no peritonitis.  CT shows small bowel obstruction with concern for possible internal hernia.  Consulted with on-call general surgeon, Dr. Teresa.  We reviewed the imaging, low suspicion for internal hernia at this time, we believe the CT changes are from her previous  abdominal surgery.  Recommended conservative management, n.p.o. and NG tube.  Currently patient has mild nausea but is declining IV medicine.  IV fluids ordered.  Patients evaluation and results requires admission for further treatment and care.  Spoke with hospitalist, reviewed patient's ED course and they accept admission.  Patient agrees with admission plan, offers no new complaints and is stable/unchanged at time of admit.        Final Clinical Impression(s) / ED Diagnoses Final diagnoses:  None    Rx / DC Orders ED Discharge Orders     None         Bari Roxie HERO, DO 05/08/23 1655

## 2023-05-08 NOTE — Telephone Encounter (Signed)
 What does she mean by obstruction?  Obstetrics or constipation?  I have no recommendations for OB bu Erick Blinks for GI

## 2023-05-08 NOTE — H&P (Signed)
 History and Physical    Patient: Alyssa Robinson FMW:[id number] DOB: 1951/02/27 DOA: 05/08/2023 DOS: the patient was seen and examined on 05/08/2023 PCP: Rollene Almarie LABOR, MD  Patient coming from: Home  Chief Complaint:  Chief Complaint  Patient presents with   Abdominal Pain   HPI: Alyssa Robinson is a 73 y.o. female with medical history significant of vertigo, hypertension, GERD, left colectomy/colostomy by Dr. Kimble 2014 (for perforated sigmoid) followed by colostomy takedown later that year presents complaining of abdominal pain that started on Sunday.  She was doing fine on Sunday but  after diner she develops abdominal pain. She develops nausea, vomiting today.  Last bowel movement on Monday.  Has had 2 episode of vomiting, fluid content  the day of admission.   Evaluation in the ED: Sodium 139, potassium 4.5 glucose 170, liver function test normal, lactic acid 1.6, white blood cell 9.8, hemoglobin 15, platelets 191, UA negative.   -CT abdomen and pelvis: Dilated loops of small bowel in the right mid abdomen, favoring mild small-bowel obstruction, possibly on the basis of internal hernia, although equivocal. Status post left hemicolectomy.    Review of Systems: As mentioned in the history of present illness. All other systems reviewed and are negative. Past Medical History:  Diagnosis Date   Allergy 20-25 years ago   Anemia    hx   Arthritis 8-10 years ago   Blood transfusion without reported diagnosis 20 years ago   Bronchitis    Bronchitis    hx   Closed patellar sleeve fracture of left knee    Diverticulosis    GERD (gastroesophageal reflux disease)    occ   Headache(784.0)    migraines occ   Hypertension    no meds in over 6yrs   Pneumonia    Seasonal allergies    Past Surgical History:  Procedure Laterality Date   ABDOMINAL HYSTERECTOMY     73  y/o   APPENDECTOMY  03/17/2013   Procedure: INCIDENTAL APPENDECTOMY;  Surgeon: Lynwood MALVA Kimble, MD;  Location: [facility] OR;   Service: General;;   capsule endoscopy     COLON SURGERY     COLONOSCOPY     in 2012 -    COLOSTOMY CLOSURE  03/17/2013   COLOSTOMY REVERSAL  03/17/2013   Procedure: COLOSTOMY REVERSAL;  Surgeon: Lynwood MALVA Kimble, MD;  Location: [facility] OR;  Service: General;;   EUS N/A 04/30/2015   Procedure: UPPER ENDOSCOPIC ULTRASOUND (EUS) LINEAR;  Surgeon: Belvie Just, MD;  Location: [facility] END[facility]COPY;  Service: Endoscopy;  Laterality: N/A;   HERNIA REPAIR     X2 2-4 y/o inguinal   JOINT REPLACEMENT  ORID Knee/right hip   2021/2022   LAPAROTOMY N/A 10/17/2012   Procedure: EXPLORATORY LAPAROTOMY,  COLON RESECTION AND COLOSTOMY;  Surgeon: Lynwood MALVA Kimble, MD;  Location: [facility] OR;  Service: General;  Laterality: N/A;   LIPOMA EXCISION Left    left arm   LYSIS OF ADHESION  03/17/2013   Procedure: LYSIS OF ADHESION;  Surgeon: Lynwood MALVA Kimble, MD;  Location: [facility] OR;  Service: General;;   ORIF PATELLA Left 02/20/2020   Procedure: OPEN REDUCTION INTERNAL (ORIF) FIXATION LEFT PATELLA;  Surgeon: Jerri Kay HERO, MD;  Location: [facility];  Service: Orthopedics;  Laterality: Left;   SMALL INTESTINE SURGERY  Perforated colon   2014   TOTAL HIP ARTHROPLASTY Right 09/12/2021   Procedure: RIGHT TOTAL HIP ARTHROPLASTY ANTERIOR APPROACH;  Surgeon: Jerri Kay HERO, MD;  Location: [facility] OR;  Service:  Orthopedics;  Laterality: Right;  3-C   TOTAL HIP ARTHROPLASTY Left 02/23/2023   Procedure: LEFT TOTAL HIP ARTHROPLASTY ANTERIOR APPROACH;  Surgeon: Jerri Kay HERO, MD;  Location: [facility] OR;  Service: Orthopedics;  Laterality: Left;  3-C   UPPER GASTROINTESTINAL ENDOSCOPY     2013 -   Social History:  reports that she has never smoked. She has never used smokeless tobacco. She reports that she does not currently use alcohol after a past usage of about 2.0 standard drinks of alcohol per week. She reports that she does not use drugs.  Allergies  Allergen Reactions   Caffeine Nausea And Vomiting and Palpitations    Makes heart race     Fluad Quadrivalent [Influenza Vac A&B Sa Adj Quad] Nausea And Vomiting    Pt allergic to the 65 and over version of the flu vaccine, tolerates regular flu vaccine    Sodium Lauryl Sulfate Rash    Swollen gums     Family History  Problem Relation Age of Onset   Esophageal cancer Mother        and ovarian cancer   Alcohol abuse Mother    Arthritis Mother    Cancer Mother    Depression Mother    Heart disease Father    Alcohol abuse Father    Hypertension Father    Diabetes Father    Early death Father    Breast cancer Sister    Colon cancer Other    Pancreatic cancer Other    Cancer Maternal Aunt    Cancer Maternal Aunt    Stroke Neg Hx    Hyperlipidemia Neg Hx    Kidney disease Neg Hx    COPD Neg Hx     Prior to Admission medications   Medication Sig Start Date End Date Taking? Authorizing Provider  aspirin  EC 81 MG tablet Take 1 tablet (81 mg total) by mouth 2 (two) times daily. To be taken after surgery to prevent blood clots 02/13/23 02/13/24  Jule Ronal CROME, PA-C  loratadine (CLARITIN) 10 MG tablet Take 10 mg by mouth daily.    [provider]  meclizine (ANTIVERT) 12.5 MG tablet Take 12.5 mg by mouth 3 (three) times daily as needed for dizziness.    [provider]  Pediatric Multiple Vit-C-FA (PEDIATRIC MULTIVITAMIN) chewable tablet Chew 1 tablet by mouth daily. [place]    [provider]  Probiotic Product (ULTRAFLORA IMMUNE HEALTH PO) Take 1 capsule by mouth daily.    [provider]  sodium chloride  (OCEAN) 0.65 % SOLN nasal spray Place 1 spray into both nostrils as needed for congestion.    [provider]    Physical Exam: Vitals:   05/08/23 0459 05/08/23 0819 05/08/23 1154 05/08/23 1603  BP: (!) 157/83 (!) 145/62 (!) 147/60 (!) 158/89  Pulse: 78 83 83 79  Resp: 18 18 18 18   Temp: 98.1 F (36.7 C) 98.1 F (36.7 C) 97.9 F (36.6 C) 98 F (36.7 C)  TempSrc: Oral Oral Oral Oral  SpO2: 100% 99% 100% 100%    General; Pleasant in No acute distress.  CVS; S 1, S 2 R RR Lungs; Normal respiratory effort, CTA Abdomen: BS decreased, mild distended, mild tenderness, no rigidity.  Extremities;No edema Neuro: alert, conversant, follows command.   Data Reviewed:  Labs reviewed   Assessment and Plan: No notes have been filed under this hospital service. Service: Hospitalist  1-SBO;  -Presents with abdominal pain, nausea vomiting.  Last bowel movement Monday. -CT abdomen  and pelvis consistent with SBO -Continue with IV fluids, n.p.o. -General Surgery has been consulted and have started a small bowel protocol -IV Protonix , as needed Zofran . -Replete electrolytes.  Will check mag and Phos   2-Dehydration:  -In the setting of SBO, vomiting.  She has hemoconcentration -Continue with IV fluids  3-HTN:  -She used to have high blood pressure, related to stress from work.  After she retired she was able to stop Norvasc . -Monitor blood pressure, as needed hydralazine       Advance Care Planning:   Code Status: Prior Full Code, does not wants to be on life support for prolong period of time.   Consults: General Surgery   Family Communication: Care discussed with patient.   Severity of Illness: The appropriate patient status for this patient is INPATIENT. Inpatient status is judged to be reasonable and necessary in order to provide the required intensity of service to ensure the patient's safety. The patient's presenting symptoms, physical exam findings, and initial radiographic and laboratory data in the context of their chronic comorbidities is felt to place them at high risk for further clinical deterioration. Furthermore, it is not anticipated that the patient will be medically stable for discharge from the hospital within 2 midnights of admission.   * I certify that at the point of admission it is my clinical judgment that the patient will require inpatient hospital care spanning beyond 2  midnights from the point of admission due to high intensity of service, high risk for further deterioration and high frequency of surveillance required.*  Author: Owen DELENA Lore, MD 05/08/2023 5:31 PM  For on call review [email or web address].

## 2023-05-09 ENCOUNTER — Inpatient Hospital Stay (HOSPITAL_COMMUNITY): Payer: Medicare Other

## 2023-05-09 ENCOUNTER — Other Ambulatory Visit: Payer: Self-pay

## 2023-05-09 ENCOUNTER — Encounter (HOSPITAL_COMMUNITY): Payer: Self-pay | Admitting: Internal Medicine

## 2023-05-09 DIAGNOSIS — K56609 Unspecified intestinal obstruction, unspecified as to partial versus complete obstruction: Secondary | ICD-10-CM | POA: Diagnosis not present

## 2023-05-09 LAB — COMPREHENSIVE METABOLIC PANEL
ALT: 15 U/L (ref 0–44)
AST: 23 U/L (ref 15–41)
Albumin: 3.9 g/dL (ref 3.5–5.0)
Alkaline Phosphatase: 91 U/L (ref 38–126)
Anion gap: 8 (ref 5–15)
BUN: 15 mg/dL (ref 8–23)
CO2: 26 mmol/L (ref 22–32)
Calcium: 9.1 mg/dL (ref 8.9–10.3)
Chloride: 107 mmol/L (ref 98–111)
Creatinine, Ser: 0.81 mg/dL (ref 0.44–1.00)
GFR, Estimated: >60 mL/min (ref >60)
Glucose, Bld: 129 mg/dL — ABNORMAL HIGH (ref 70–99)
Potassium: 4.1 mmol/L (ref 3.5–5.1)
Sodium: 141 mmol/L (ref 135–145)
Total Bilirubin: 1.3 mg/dL — ABNORMAL HIGH (ref 0.0–1.2)
Total Protein: 6.8 g/dL (ref 6.5–8.1)

## 2023-05-09 LAB — CBC
HCT: 45.4 % (ref 36.0–46.0)
Hemoglobin: 14.8 g/dL (ref 12.0–15.0)
MCH: 31.4 pg (ref 26.0–34.0)
MCHC: 32.6 g/dL (ref 30.0–36.0)
MCV: 96.4 fL (ref 80.0–100.0)
Platelets: 166 10*3/uL (ref 150–400)
RBC: 4.71 MIL/uL (ref 3.87–5.11)
RDW: 14.4 % (ref 11.5–15.5)
WBC: 6.8 10*3/uL (ref 4.0–10.5)
nRBC: 0 % (ref 0.0–0.2)

## 2023-05-09 LAB — MAGNESIUM: Magnesium: 2.6 mg/dL — ABNORMAL HIGH (ref 1.7–2.4)

## 2023-05-09 LAB — LACTIC ACID, PLASMA: Lactic Acid, Venous: 1 mmol/L (ref 0.5–1.9)

## 2023-05-09 MED ORDER — ORAL CARE MOUTH RINSE: 15.0000 mL | OROMUCOSAL | Status: DC | PRN

## 2023-05-09 MED ORDER — CARMEX CLASSIC LIP BALM EX OINT
TOPICAL_OINTMENT | CUTANEOUS | Status: DC | PRN
  Filled 2023-05-09: qty 10

## 2023-05-09 NOTE — TOC Initial Note (Addendum)
 Transition of Care (TOC) - Initial/Assessment Note   Spoke to patient at bedside. Confirmed face sheet information .  Patient from home with husband. Had hip surgery in November 2023 has walker and cane but does not currently use.   Apartment is handicap.   PCP is [facility] address is [address] [place], [place] [address]   Had home health in November with [facility], currently not active with home health   Transition of Care Department [facility]) has reviewed patient and no TOC needs have been identified at this time. We will continue to monitor patient advancement through interdisciplinary progression rounds. If new patient transition needs arise, please place a TOC consult.   Patient Details  Name: Alyssa Robinson MRN: [id number] Date of Birth: 1950/07/29  Transition of Care [facility]) CM/SW Contact:    Terre Ferri, RN Phone Number: 05/09/2023, 11:38 AM  Clinical Narrative:                   Expected Discharge Plan: Home/Self Care Barriers to Discharge: Continued Medical Work up   Patient Goals and CMS Choice Patient states their goals for this hospitalization and ongoing recovery are:: to return to home   Choice offered to / list presented to : NA      Expected Discharge Plan and Services   Discharge Planning Services: CM Consult Post Acute Care Choice: NA Living arrangements for the past 2 months: Apartment                 DME Arranged: N/A DME Agency: NA       HH Arranged: NA HH Agency: NA        Prior Living Arrangements/Services Living arrangements for the past 2 months: Apartment Lives with:: Spouse Patient language and need for interpreter reviewed:: Yes Do you feel safe going back to the place where you live?: Yes      Need for Family Participation in Patient Care: Yes (Comment) Care giver support system in place?: Yes (comment) Current home services: DME Criminal Activity/Legal Involvement Pertinent to Current  Situation/Hospitalization: No - Comment as needed  Activities of Daily Living   ADL Screening (condition at time of admission) Independently performs ADLs?: Yes (appropriate for developmental age) Is the patient deaf or have difficulty hearing?: No Does the patient have difficulty seeing, even when wearing glasses/contacts?: No Does the patient have difficulty concentrating, remembering, or making decisions?: No  Permission Sought/Granted   Permission granted to share information with : No              Emotional Assessment Appearance:: Appears stated age Attitude/Demeanor/Rapport: Engaged Affect (typically observed): Accepting Orientation: : Oriented to Self, Oriented to Place, Oriented to  Time, Oriented to Situation Alcohol / Substance Use: Not Applicable Psych Involvement: No (comment)  Admission diagnosis:  Small bowel obstruction (HCC) [K56.609] SBO (small bowel obstruction) (HCC) [K56.609] Patient Active Problem List   Diagnosis Date Noted   SBO (small bowel obstruction) (HCC) 05/08/2023   Status post total replacement of left hip 02/23/2023   Bronchitis 09/13/2022   History of colonic polyps 02/03/2022   Family history of early CAD 07/02/2020   Lipoma of abdominal wall 08/04/2019   Osteopenia 01/28/2019   Allergic rhinitis 01/28/2019   Thrombocytopenia (HCC) 10/17/2017   Hyperlipidemia with target LDL less than 130 09/14/2015   Vaginal atrophy 06/01/2015   Cyst of pancreas 03/06/2015   Glaucoma suspect of both eyes 03/11/2014   Nuclear sclerosis of both eyes  03/11/2014   Routine general medical examination at a health care facility 12/11/2012   Essential hypertension, benign 12/11/2012   PCP:  Adelia Homestead, MD Pharmacy:   [facility] [place], [place] - [address] [place] [address] Phone: [phone] Fax: [phone]     Social Drivers of Health (SDOH) Social History: SDOH  Screenings   Food Insecurity: No Food Insecurity (05/09/2023)  Housing: Low Risk  (05/09/2023)  Transportation Needs: No Transportation Needs (05/09/2023)  Utilities: Not At Risk (05/09/2023)  Alcohol Screen: Low Risk  (07/05/2021)  Depression (PHQ2-9): Low Risk  (09/13/2022)  Financial Resource Strain: Low Risk  (04/11/2023)  Physical Activity: Sufficiently Active (04/11/2023)  Social Connections: Moderately Integrated (05/09/2023)  Stress: No Stress Concern Present (04/11/2023)  Tobacco Use: Low Risk  (05/09/2023)   SDOH Interventions:     Readmission Risk Interventions     No data to display

## 2023-05-09 NOTE — ED Notes (Signed)
 Intermittent NG suction restarted.

## 2023-05-09 NOTE — Hospital Course (Addendum)
Alyssa Robinson is a 73 y.o. female with a history of vertigo, hypertension, GERD, left colectomy/colostomy with colostomy takedown.  Patient presented secondary to abdominal pain, nausea, vomiting with evidence of small bowel obstruction on CT imaging.  Patient made NPO.  General surgery consulted.  NG tube inserted and intermittent suction started. Patient with progressive improvement in symptoms. Repeat abdominal x-ray revealed resolution of bowel obstruction. NG tube was removed and diet advanced successfully.

## 2023-05-09 NOTE — Progress Notes (Signed)
 PROGRESS NOTE    Alyssa Robinson  ZOX:[id number] DOB: Jul 14, 1950 DOA: 05/08/2023 PCP: Adelia Homestead, MD   Brief Narrative: Alyssa Robinson is a 73 y.o. female with a history of vertigo, hypertension, GERD, left colectomy/colostomy with colostomy takedown.  Patient presented secondary to abdominal pain, nausea, vomiting with evidence of small bowel obstruction on CT imaging.  Patient made NPO.  General surgery consulted.  NG tube inserted and intermittent suction started.   Assessment and Plan:  Small bowel obstruction Patient with abdominal pain, nausea, vomiting with CT evidence of SBO with possible internal hernia although equivocal.  Patient made n.p.o. and general surgery was consulted.  NG tube placed under intermittent suction. -General Surgery recommendations: Conservative management: Continue NG tube/intermittent suction, await for improved flatus/bowel movement -N.p.o. -Continue IV fluids while n.p.o.  Dehydration Secondary to vomiting in setting of small bowel obstruction.  Patient started on IV fluids.  Primary hypertension This appears to been a prior diagnosis.  Patient was previously managed on amlodipine  which she was able to discontinue.   DVT prophylaxis: Lovenox  Code Status:   Code Status: Full Code Family Communication: None at bedside Disposition Plan: Discharge home likely in 2 days pending continued general surgery recommendations   Consultants:  General surgery  Procedures:  NG tube placement  Antimicrobials: None    Subjective: Patient reports improvement in abdominal pain, although there is still some discomfort. Passing a little bit of gas. No bowel movement.  Objective: BP (!) 148/84 (BP Location: Right Arm)   Pulse 65   Temp 98.4 F (36.9 C)   Resp 18   Ht 4\' 11"  (1.499 m)   Wt 51.4 kg   SpO2 99%   BMI 22.89 kg/m   Examination:  General exam: Appears calm and comfortable Respiratory system: Clear to auscultation.  Respiratory effort normal. Cardiovascular system: S1 & S2 heard, RRR. No murmurs, rubs, gallops or clicks. Gastrointestinal system: Abdomen is nondistended, soft and nontender. Normal bowel sounds heard. Central nervous system: Alert and oriented. No focal neurological deficits. Musculoskeletal: No edema. No calf tenderness Skin: No cyanosis. No rashes Psychiatry: Judgement and insight appear normal. Mood & affect appropriate.    Data Reviewed: I have personally reviewed following labs and imaging studies . CBC Lab Results  Component Value Date   WBC 6.8 05/09/2023   RBC 4.71 05/09/2023   HGB 14.8 05/09/2023   HCT 45.4 05/09/2023   MCV 96.4 05/09/2023   MCH 31.4 05/09/2023   PLT 166 05/09/2023   MCHC 32.6 05/09/2023   RDW 14.4 05/09/2023   LYMPHSABS 1.2 09/05/2021   MONOABS 0.7 09/05/2021   EOSABS 0.1 09/05/2021   BASOSABS 0.1 09/05/2021     Last metabolic panel Lab Results  Component Value Date   NA 141 05/09/2023   K 4.1 05/09/2023   CL 107 05/09/2023   CO2 26 05/09/2023   BUN 15 05/09/2023   CREATININE 0.81 05/09/2023   GLUCOSE 129 (H) 05/09/2023   GFRNONAA >60 05/09/2023   GFRAA >90 03/22/2013   CALCIUM  9.1 05/09/2023   PROT 6.8 05/09/2023   ALBUMIN  3.9 05/09/2023   BILITOT 1.3 (H) 05/09/2023   ALKPHOS 91 05/09/2023   AST 23 05/09/2023   ALT 15 05/09/2023   ANIONGAP 8 05/09/2023    GFR: Estimated Creatinine Clearance: 41.6 mL/min (by C-G formula based on SCr of 0.81 mg/dL).  No results found for this or any previous visit (from the past 240 hours).    Radiology Studies: DG Abd Portable 1V-Small  Bowel Obstruction Protocol-initial, 8 hr delay Result Date: 05/09/2023 CLINICAL DATA:  73 year old female with suspected small-bowel obstruction. 8 hour delayed film. EXAM: PORTABLE ABDOMEN - 1 VIEW COMPARISON:  05/08/2023 at 7:58 p.m. FINDINGS: Single view of the abdomen demonstrates a nasogastric tube in position with tip in the mid body of the stomach. Oral  contrast material is noted lying dependently in the stomach. No definite oral contrast material is noted within the bowel. Multiple dilated loops of small bowel are noted throughout the central abdomen measuring up to approximately 4 cm. Gas and stool are also noted throughout the colon and rectum. No definite pneumoperitoneum. Status post bilateral hip arthroplasty. IMPRESSION: 1. Support apparatus, as above. 2. Bowel-gas pattern remains concerning for probable small-bowel obstruction, as above. Electronically Signed   By: Alexandria Angel M.D.   On: 05/09/2023 05:30   DG Abd Portable 1V-Small Bowel Protocol-Position Verification Result Date: 05/08/2023 CLINICAL DATA:  Nasogastric tube placement. EXAM: PORTABLE ABDOMEN - 1 VIEW COMPARISON:  CT earlier today FINDINGS: Tip and side port of the enteric tube in the left upper quadrant in the region of the stomach, the stomach is dilated on CT. Focal dilatation in the upper abdomen was characterized on CT. IMPRESSION: Tip and side port of the enteric tube below the diaphragm in the stomach. Electronically Signed   By: Chadwick Colonel M.D.   On: 05/08/2023 20:07   CT ABDOMEN PELVIS W CONTRAST Result Date: 05/08/2023 CLINICAL DATA:  Abdominal pain EXAM: CT ABDOMEN AND PELVIS WITH CONTRAST TECHNIQUE: Multidetector CT imaging of the abdomen and pelvis was performed using the standard protocol following bolus administration of intravenous contrast. RADIATION DOSE REDUCTION: This exam was performed according to the departmental dose-optimization program which includes automated exposure control, adjustment of the mA and/or kV according to patient size and/or use of iterative reconstruction technique. CONTRAST:  75mL OMNIPAQUE  IOHEXOL  350 MG/ML SOLN COMPARISON:  MRI abdomen dated 06/11/2022. CT abdomen/pelvis dated 03/16/2014. FINDINGS: Lower chest: Mild scarring/atelectasis in the bilateral lower lungs. Hepatobiliary: 13 mm cyst in the posterior right hepatic lobe  (series 2/image 19), benign. Gallbladder is unremarkable. No intrahepatic or extrahepatic ductal dilatation. 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 Given long-term stability dating back to at least 2016, no follow-up is recommended. No pancreatic atrophy or ductal dilatation. Spleen: Within normal limits. Adrenals/Urinary Tract: Adrenal glands are within normal limits. Kidneys are within normal limits.  No hydronephrosis. Bladder is underdistended and poorly evaluated due to streak artifact. Stomach/Bowel: Stomach is within normal limits. Dilated loops of small bowel in the right mid abdomen (series 2/image 35) with very mild central mesenteric swirling (series 2/image 42). This appearance favors mild small bowel obstruction, possibly on the basis of internal hernia, although equivocal. Appendix is not discretely visualized. Status post left hemicolectomy with suture line in the lower pelvis (series 2/image 5). No colonic wall thickening or inflammatory changes. Vascular/Lymphatic: No evidence of abdominal aortic aneurysm. No suspicious abdominopelvic lymphadenopathy. Reproductive: Suspected prior hysterectomy, although poorly evaluated due to streak artifact. No adnexal masses. Other: No abdominopelvic ascites.  No free air. Musculoskeletal: Degenerative changes of the visualized thoracolumbar spine. Bilateral hip arthroplasties. IMPRESSION: Dilated loops of small bowel in the right mid abdomen, favoring mild small-bowel obstruction, possibly on the basis of internal hernia, although equivocal. Status post left hemicolectomy. Additional ancillary findings as above. Electronically Signed   By: Zadie Herter M.D.   On: 05/08/2023 03:11  LOS: 1 day    Aneita Keens, MD [facility] 05/09/2023, 2:24 PM   If 7PM-7AM, please contact night-coverage [email or web address]

## 2023-05-09 NOTE — Telephone Encounter (Signed)
 No visit needed she is in hospital as per notes below

## 2023-05-09 NOTE — Progress Notes (Signed)
  Subjective No acute events.  Feeling reasonably well.  Denies any abdominal pain.  Does not report any flatus/BM yet.  NG in place.  Objective: Vital signs in last 24 hours: Temp:  [97.7 F (36.5 C)-99.5 F (37.5 C)] 98.1 F (36.7 C) (01/15 0800) Pulse Rate:  [69-89] 76 (01/15 0800) Resp:  [16-20] 16 (01/15 0700) BP: (147-180)/(60-93) 180/90 (01/15 0800) SpO2:  [98 %-100 %] 98 % (01/15 0800)    Intake/Output from previous day: 01/14 0701 - 01/15 0700 In: 208.3 [I.V.:208.3] Out: 500 [Emesis/NG output:500] Intake/Output this shift: No intake/output data recorded.  Gen: NAD, comfortable CV: RRR Pulm: Normal work of breathing Abd: Soft, mild to moderate distention.  No tenderness.  NG tube is thin gastric/saliva type output. Ext: SCDs in place  Lab Results: CBC  Recent Labs    05/08/23 0037  WBC 9.8  HGB 15.6*  HCT 47.2*  PLT 191   BMET Recent Labs    05/08/23 0037  NA 139  K 4.5  CL 101  CO2 26  GLUCOSE 170*  BUN 21  CREATININE 0.85  CALCIUM  9.7   PT/INR No results for input(s): "LABPROT", "INR" in the last 72 hours. ABG No results for input(s): "PHART", "HCO3" in the last 72 hours.  Invalid input(s): "PCO2", "PO2"  Studies/Results:  Anti-infectives: Anti-infectives (From admission, onward)    None        Assessment/Plan: Patient Active Problem List   Diagnosis Date Noted   SBO (small bowel obstruction) (HCC) 05/08/2023   Status post total replacement of left hip 02/23/2023   Bronchitis 09/13/2022   History of colonic polyps 02/03/2022   Family history of early CAD 07/02/2020   Lipoma of abdominal wall 08/04/2019   Osteopenia 01/28/2019   Allergic rhinitis 01/28/2019   Thrombocytopenia (HCC) 10/17/2017   Hyperlipidemia with target LDL less than 130 09/14/2015   Vaginal atrophy 06/01/2015   Cyst of pancreas 03/06/2015   Glaucoma suspect of both eyes 03/11/2014   Nuclear sclerosis of both eyes 03/11/2014   Routine general medical  examination at a health care facility 12/11/2012   Essential hypertension, benign 12/11/2012   SBO  -Doing clinically well.  Awaiting flatus/BM.  Continue NG tube today.  Will follow closely with you.  LOS: 1 day   I spent a total of 35 minutes in both face-to-face and non-face-to-face activities, excluding procedures performed, for this visit on the date of this encounter.   Beatris Lincoln, MD [facility], [facility]

## 2023-05-10 ENCOUNTER — Inpatient Hospital Stay (HOSPITAL_COMMUNITY): Payer: Medicare Other

## 2023-05-10 DIAGNOSIS — K56609 Unspecified intestinal obstruction, unspecified as to partial versus complete obstruction: Secondary | ICD-10-CM | POA: Diagnosis not present

## 2023-05-10 NOTE — Plan of Care (Signed)
  Problem: Education: Goal: Knowledge of General Education information will improve Description: Including pain rating scale, medication(s)/side effects and non-pharmacologic comfort measures Outcome: Progressing   Problem: Pain Management: Goal: General experience of comfort will improve Outcome: Progressing   Problem: Safety: Goal: Ability to remain free from injury will improve Outcome: Progressing

## 2023-05-10 NOTE — Progress Notes (Signed)
PROGRESS NOTE    Alyssa Robinson  ZOX:[id number] DOB: 09-12-1950 DOA: 05/08/2023 PCP: Myrlene Broker, MD   Brief Narrative: Alyssa Robinson is a 73 y.o. female with a history of vertigo, hypertension, GERD, left colectomy/colostomy with colostomy takedown.  Patient presented secondary to abdominal pain, nausea, vomiting with evidence of small bowel obstruction on CT imaging.  Patient made NPO.  General surgery consulted.  NG tube inserted and intermittent suction started.   Assessment and Plan:  Small bowel obstruction Patient with abdominal pain, nausea, vomiting with CT evidence of SBO with possible internal hernia although equivocal.  Patient made n.p.o. and general surgery was consulted.  NG tube placed under intermittent suction. Repeat X-ray today shows resolved bowel obstruction. -General Surgery recommendations: Conservative management: Continue NG tube/intermittent suction, await for improved flatus/bowel movement; Recommendations pending today. -N.p.o. pending general surgery recommendations -Continue IV fluids while n.p.o.  Dehydration Secondary to vomiting in setting of small bowel obstruction.  Patient started on IV fluids.  Primary hypertension This appears to been a prior diagnosis.  Patient was previously managed on amlodipine which she was able to discontinue.   DVT prophylaxis: Lovenox Code Status:   Code Status: Full Code Family Communication: None at bedside Disposition Plan: Discharge home likely in 1-2 days pending continued general surgery recommendations and ability to advance diet   Consultants:  General surgery  Procedures:  NG tube placement  Antimicrobials: None    Subjective: Continued improvement in abdominal pain. Passing gas. No bowel movements.  Objective: BP (!) 143/76 (BP Location: Right Arm)   Pulse 62   Temp 97.9 F (36.6 C)   Resp 18   Ht 4\' 11"  (1.499 m)   Wt 51.4 kg   SpO2 97%   BMI 22.89 kg/m    Examination:  General exam: Appears calm and comfortable Respiratory system: Clear to auscultation. Respiratory effort normal. Cardiovascular system: S1 & S2 heard, RRR. No murmurs. Gastrointestinal system: Abdomen is mildly distended, soft and mildly tender. Normal bowel sounds heard. Central nervous system: Alert and oriented. No focal neurological deficits. Musculoskeletal: No edema. No calf tenderness Psychiatry: Judgement and insight appear normal. Mood & affect appropriate.    Data Reviewed: I have personally reviewed following labs and imaging studies . CBC Lab Results  Component Value Date   WBC 6.8 05/09/2023   RBC 4.71 05/09/2023   HGB 14.8 05/09/2023   HCT 45.4 05/09/2023   MCV 96.4 05/09/2023   MCH 31.4 05/09/2023   PLT 166 05/09/2023   MCHC 32.6 05/09/2023   RDW 14.4 05/09/2023   LYMPHSABS 1.2 09/05/2021   MONOABS 0.7 09/05/2021   EOSABS 0.1 09/05/2021   BASOSABS 0.1 09/05/2021     Last metabolic panel Lab Results  Component Value Date   NA 141 05/09/2023   K 4.1 05/09/2023   CL 107 05/09/2023   CO2 26 05/09/2023   BUN 15 05/09/2023   CREATININE 0.81 05/09/2023   GLUCOSE 129 (H) 05/09/2023   GFRNONAA >60 05/09/2023   GFRAA >90 03/22/2013   CALCIUM 9.1 05/09/2023   PROT 6.8 05/09/2023   ALBUMIN 3.9 05/09/2023   BILITOT 1.3 (H) 05/09/2023   ALKPHOS 91 05/09/2023   AST 23 05/09/2023   ALT 15 05/09/2023   ANIONGAP 8 05/09/2023    GFR: Estimated Creatinine Clearance: 41.6 mL/min (by C-G formula based on SCr of 0.81 mg/dL).  No results found for this or any previous visit (from the past 240 hours).    Radiology Studies: DG Abd Portable  1V Result Date: 05/10/2023 CLINICAL DATA:  Small bowel obstruction. EXAM: PORTABLE ABDOMEN - 1 VIEW COMPARISON:  Abdominal x-ray from yesterday. FINDINGS: Unchanged enteric tube in the stomach. Oral contrast has reached the splenic flexure of the colon. Small bowel dilatation has resolved. No acute osseous  abnormality. IMPRESSION: 1. Resolved small bowel obstruction. Electronically Signed   By: Obie Dredge M.D.   On: 05/10/2023 08:46   DG Abd Portable 1V-Small Bowel Obstruction Protocol-initial, 8 hr delay Result Date: 05/09/2023 CLINICAL DATA:  73 year old female with suspected small-bowel obstruction. 8 hour delayed film. EXAM: PORTABLE ABDOMEN - 1 VIEW COMPARISON:  05/08/2023 at 7:58 p.m. FINDINGS: Single view of the abdomen demonstrates a nasogastric tube in position with tip in the mid body of the stomach. Oral contrast material is noted lying dependently in the stomach. No definite oral contrast material is noted within the bowel. Multiple dilated loops of small bowel are noted throughout the central abdomen measuring up to approximately 4 cm. Gas and stool are also noted throughout the colon and rectum. No definite pneumoperitoneum. Status post bilateral hip arthroplasty. IMPRESSION: 1. Support apparatus, as above. 2. Bowel-gas pattern remains concerning for probable small-bowel obstruction, as above. Electronically Signed   By: Trudie Reed M.D.   On: 05/09/2023 05:30   DG Abd Portable 1V-Small Bowel Protocol-Position Verification Result Date: 05/08/2023 CLINICAL DATA:  Nasogastric tube placement. EXAM: PORTABLE ABDOMEN - 1 VIEW COMPARISON:  CT earlier today FINDINGS: Tip and side port of the enteric tube in the left upper quadrant in the region of the stomach, the stomach is dilated on CT. Focal dilatation in the upper abdomen was characterized on CT. IMPRESSION: Tip and side port of the enteric tube below the diaphragm in the stomach. Electronically Signed   By: Narda Rutherford M.D.   On: 05/08/2023 20:07      LOS: 2 days    Alyssa Hawking, MD [facility] 05/10/2023, 10:21 AM   If 7PM-7AM, please contact night-coverage [email or web address]

## 2023-05-10 NOTE — Progress Notes (Signed)
  Subjective Passing flatus. No BM. No nausea. Still mildly bloated but improved  Husband at bedside  Objective: Vital signs in last 24 hours: Temp:  [97.9 F (36.6 C)-98.9 F (37.2 C)] 97.9 F (36.6 C) (01/16 0828) Pulse Rate:  [53-65] 62 (01/16 0828) Resp:  [18] 18 (01/16 0828) BP: (143-155)/(75-84) 143/76 (01/16 0828) SpO2:  [97 %-99 %] 97 % (01/16 0828) Last BM Date : 05/07/23  Intake/Output from previous day: 01/15 0701 - 01/16 0700 In: 1571.3 [I.V.:1571.3] Out: 300 [Urine:300] Intake/Output this shift: Total I/O In: 0  Out: 100 [Emesis/NG output:100]  Gen: NAD, comfortable Pulm: Normal work of breathing Abd: Soft, mild distention.  No tenderness.  NG tube is thin gastric/saliva type output. Ext: SCDs in place  Lab Results: CBC  Recent Labs    05/08/23 0037 05/09/23 0448  WBC 9.8 6.8  HGB 15.6* 14.8  HCT 47.2* 45.4  PLT 191 166   BMET Recent Labs    05/08/23 0037 05/09/23 0448  NA 139 141  K 4.5 4.1  CL 101 107  CO2 26 26  GLUCOSE 170* 129*  BUN 21 15  CREATININE 0.85 0.81  CALCIUM 9.7 9.1   PT/INR No results for input(s): "LABPROT", "INR" in the last 72 hours. ABG No results for input(s): "PHART", "HCO3" in the last 72 hours.  Invalid input(s): "PCO2", "PO2"  Studies/Results:  Anti-infectives: Anti-infectives (From admission, onward)    None        Assessment/Plan: Patient Active Problem List   Diagnosis Date Noted   SBO (small bowel obstruction) (HCC) 05/08/2023   Status post total replacement of left hip 02/23/2023   Bronchitis 09/13/2022   History of colonic polyps 02/03/2022   Family history of early CAD 07/02/2020   Lipoma of abdominal wall 08/04/2019   Osteopenia 01/28/2019   Allergic rhinitis 01/28/2019   Thrombocytopenia (HCC) 10/17/2017   Hyperlipidemia with target LDL less than 130 09/14/2015   Vaginal atrophy 06/01/2015   Cyst of pancreas 03/06/2015   Glaucoma suspect of both eyes 03/11/2014   Nuclear sclerosis  of both eyes 03/11/2014   Routine general medical examination at a health care facility 12/11/2012   Essential hypertension, benign 12/11/2012   SBO  - NGT Minimal output (100 ml/24h) - am xray today with resolved SBO - clamp NGT and start CLD. If tolerates then NGT out today  FEN: clamp NGT, CLD ID: none VTE: lovenox   LOS: 2 days   I reviewed hospitalist notes, last 24 h vitals and pain scores, last 48 h intake and output, last 24 h labs and trends, and last 24 h imaging results.    Eric Form, [facility] 05/10/2023, 9:03 AM Please see Amion for pager number during day hours 7:00am-4:30pm

## 2023-05-11 DIAGNOSIS — K56609 Unspecified intestinal obstruction, unspecified as to partial versus complete obstruction: Secondary | ICD-10-CM | POA: Diagnosis not present

## 2023-05-11 NOTE — Progress Notes (Signed)
Pt discharged home in stable condition. Discharge instructions given. No immediate questions or concerns at this time. Pt chose to ambulate off the department.

## 2023-05-11 NOTE — Discharge Summary (Signed)
Physician Discharge Summary   Patient: Alyssa Robinson MRN: [id number] DOB: August 05, 1950  Admit date:     05/08/2023  Discharge date: 05/11/23  Discharge Physician: Jacquelin Hawking, MD   PCP: Myrlene Broker, MD   Recommendations at discharge:  PCP visit for hospital follow-up General surgery visit as needed  Discharge Diagnoses: Principal Problem:   SBO (small bowel obstruction) (HCC) Active Problems:   Essential hypertension, benign   Hyperlipidemia with target LDL less than 130  Resolved Problems:   * No resolved hospital problems. [facility] Course: Alyssa Robinson is a 73 y.o. female with a history of vertigo, hypertension, GERD, left colectomy/colostomy with colostomy takedown.  Patient presented secondary to abdominal pain, nausea, vomiting with evidence of small bowel obstruction on CT imaging.  Patient made NPO.  General surgery consulted.  NG tube inserted and intermittent suction started. Patient with progressive improvement in symptoms. Repeat abdominal x-ray revealed resolution of bowel obstruction. NG tube was removed and diet advanced successfully.  Assessment and Plan:  Small bowel obstruction Patient with abdominal pain, nausea, vomiting with CT evidence of SBO with possible internal hernia although equivocal.  Patient made n.p.o. and general surgery was consulted.  NG tube placed under intermittent suction. Repeat X-ray revealed resolved bowel obstruction. NG tube was removed and diet advanced successfully. Patient produced a bowel movement prior to discharge.   Dehydration Secondary to vomiting in setting of small bowel obstruction.  Patient started on IV fluids.   Primary hypertension This appears to been a prior diagnosis.  Patient was previously managed on amlodipine which she was able to discontinue.   Consultants:  General surgery   Procedures:  NG tube placement  Disposition: Home Diet recommendation: Soft diet   DISCHARGE MEDICATION: Allergies  as of 05/11/2023       Reactions   Caffeine Nausea And Vomiting, Palpitations   Makes heart race   Fluad Quadrivalent [influenza Vac A&b Sa Adj Quad] Nausea And Vomiting   Pt allergic to the 65 and over version of the flu vaccine, tolerates regular flu vaccine    Sodium Lauryl Sulfate Rash   Swollen gums        Medication List     TAKE these medications    loratadine 10 MG tablet Commonly known as: CLARITIN Take 10 mg by mouth daily.   meclizine 12.5 MG tablet Commonly known as: ANTIVERT Take 12.5 mg by mouth 3 (three) times daily as needed for dizziness.   pediatric multivitamin chewable tablet Chew 1 tablet by mouth daily. [place]   sodium chloride 0.65 % Soln nasal spray Commonly known as: OCEAN Place 2-3 sprays into both nostrils 2 (two) times daily.   ULTRAFLORA IMMUNE HEALTH PO Take 1 capsule by mouth daily.        Discharge Exam: BP 117/68 (BP Location: Right Arm)   Pulse 67   Temp (!) 97.2 F (36.2 C) (Oral)   Resp 18   Ht 4\' 11"  (1.499 m)   Wt 51.4 kg   SpO2 100%   BMI 22.89 kg/m   General exam: Appears calm and comfortable Respiratory system:  Respiratory effort normal. Central nervous system: Alert and oriented Psychiatry: Judgement and insight appear normal. Mood & affect appropriate.   Condition at discharge: stable  The results of significant diagnostics from this hospitalization (including imaging, microbiology, ancillary and laboratory) are listed below for reference.   Imaging Studies: DG Abd Portable 1V Result Date: 05/10/2023 CLINICAL DATA:  Small bowel obstruction. EXAM: PORTABLE ABDOMEN -  1 VIEW COMPARISON:  Abdominal x-ray from yesterday. FINDINGS: Unchanged enteric tube in the stomach. Oral contrast has reached the splenic flexure of the colon. Small bowel dilatation has resolved. No acute osseous abnormality. IMPRESSION: 1. Resolved small bowel obstruction. Electronically Signed   By: Obie Dredge M.D.   On: 05/10/2023 08:46    DG Abd Portable 1V-Small Bowel Obstruction Protocol-initial, 8 hr delay Result Date: 05/09/2023 CLINICAL DATA:  73 year old female with suspected small-bowel obstruction. 8 hour delayed film. EXAM: PORTABLE ABDOMEN - 1 VIEW COMPARISON:  05/08/2023 at 7:58 p.m. FINDINGS: Single view of the abdomen demonstrates a nasogastric tube in position with tip in the mid body of the stomach. Oral contrast material is noted lying dependently in the stomach. No definite oral contrast material is noted within the bowel. Multiple dilated loops of small bowel are noted throughout the central abdomen measuring up to approximately 4 cm. Gas and stool are also noted throughout the colon and rectum. No definite pneumoperitoneum. Status post bilateral hip arthroplasty. IMPRESSION: 1. Support apparatus, as above. 2. Bowel-gas pattern remains concerning for probable small-bowel obstruction, as above. Electronically Signed   By: Trudie Reed M.D.   On: 05/09/2023 05:30   DG Abd Portable 1V-Small Bowel Protocol-Position Verification Result Date: 05/08/2023 CLINICAL DATA:  Nasogastric tube placement. EXAM: PORTABLE ABDOMEN - 1 VIEW COMPARISON:  CT earlier today FINDINGS: Tip and side port of the enteric tube in the left upper quadrant in the region of the stomach, the stomach is dilated on CT. Focal dilatation in the upper abdomen was characterized on CT. IMPRESSION: Tip and side port of the enteric tube below the diaphragm in the stomach. Electronically Signed   By: Narda Rutherford M.D.   On: 05/08/2023 20:07   CT ABDOMEN PELVIS W CONTRAST Result Date: 05/08/2023 CLINICAL DATA:  Abdominal pain EXAM: CT ABDOMEN AND PELVIS WITH CONTRAST TECHNIQUE: Multidetector CT imaging of the abdomen and pelvis was performed using the standard protocol following bolus administration of intravenous contrast. RADIATION DOSE REDUCTION: This exam was performed according to the departmental dose-optimization program which includes automated  exposure control, adjustment of the mA and/or kV according to patient size and/or use of iterative reconstruction technique. CONTRAST:  75mL OMNIPAQUE IOHEXOL 350 MG/ML SOLN COMPARISON:  MRI abdomen dated 06/11/2022. CT abdomen/pelvis dated 03/16/2014. FINDINGS: Lower chest: Mild scarring/atelectasis in the bilateral lower lungs. Hepatobiliary: 13 mm cyst in the posterior right hepatic lobe (series 2/image 19), benign. Gallbladder is unremarkable. No intrahepatic or extrahepatic ductal dilatation. 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 Given long-term stability dating back to at least 2016, no follow-up is recommended. No pancreatic atrophy or ductal dilatation. Spleen: Within normal limits. Adrenals/Urinary Tract: Adrenal glands are within normal limits. Kidneys are within normal limits.  No hydronephrosis. Bladder is underdistended and poorly evaluated due to streak artifact. Stomach/Bowel: Stomach is within normal limits. Dilated loops of small bowel in the right mid abdomen (series 2/image 35) with very mild central mesenteric swirling (series 2/image 42). This appearance favors mild small bowel obstruction, possibly on the basis of internal hernia, although equivocal. Appendix is not discretely visualized. Status post left hemicolectomy with suture line in the lower pelvis (series 2/image 5). No colonic wall thickening or inflammatory changes. Vascular/Lymphatic: No evidence of abdominal aortic aneurysm. No suspicious abdominopelvic lymphadenopathy. Reproductive: Suspected prior hysterectomy, although poorly evaluated due to streak artifact. No adnexal masses. Other: No abdominopelvic ascites.  No free air. Musculoskeletal: Degenerative  changes of the visualized thoracolumbar spine. Bilateral hip arthroplasties. IMPRESSION: Dilated loops of small bowel in the right mid abdomen, favoring mild small-bowel obstruction, possibly on the  basis of internal hernia, although equivocal. Status post left hemicolectomy. Additional ancillary findings as above. Electronically Signed   By: Charline Bills M.D.   On: 05/08/2023 03:11    Microbiology: Results for orders placed or performed during the hospital encounter of 02/15/23  Surgical pcr screen     Status: None   Collection Time: 02/15/23 11:26 AM   Specimen: Nasal Mucosa; Nasal Swab  Result Value Ref Range Status   MRSA, PCR NEGATIVE NEGATIVE Final   Staphylococcus aureus NEGATIVE NEGATIVE Final    Comment: (NOTE) The Xpert SA Assay (FDA approved for NASAL specimens in patients 33 years of age and older), is one component of a comprehensive surveillance program. It is not intended to diagnose infection nor to guide or monitor treatment. Performed at [facility], [address] [place], [place] [address]     Labs: CBC: Recent Labs  Lab 05/08/23 0037 05/09/23 0448  WBC 9.8 6.8  HGB 15.6* 14.8  HCT 47.2* 45.4  MCV 96.5 96.4  PLT 191 166   Basic Metabolic Panel: Recent Labs  Lab 05/08/23 0037 05/09/23 0448  NA 139 141  K 4.5 4.1  CL 101 107  CO2 26 26  GLUCOSE 170* 129*  BUN 21 15  CREATININE 0.85 0.81  CALCIUM 9.7 9.1  MG  --  2.6*   Liver Function Tests: Recent Labs  Lab 05/08/23 0037 05/09/23 0448  AST 27 23  ALT 17 15  ALKPHOS 102 91  BILITOT 0.9 1.3*  PROT 7.6 6.8  ALBUMIN 4.5 3.9    Discharge time spent: 35 minutes.  Signed: Jacquelin Hawking, MD [facility] 05/11/2023

## 2023-05-11 NOTE — Plan of Care (Signed)
  Problem: Clinical Measurements: Goal: Cardiovascular complication will be avoided Outcome: Progressing   Problem: Activity: Goal: Risk for activity intolerance will decrease Outcome: Progressing   Problem: Clinical Measurements: Goal: Will remain free from infection Outcome: Progressing   Problem: Clinical Measurements: Goal: Ability to maintain clinical measurements within normal limits will improve Outcome: Progressing

## 2023-05-11 NOTE — Progress Notes (Signed)
  Subjective Has had 2 bowel movements and passing flatus. Having very minimal low abdominal pain and distension but significantly improved. Tolerating soft without n/v/worsening abdominal pain. She has been ambulating  Husband at bedside  Objective: Vital signs in last 24 hours: Temp:  [97.2 F (36.2 C)-98.1 F (36.7 C)] 97.2 F (36.2 C) (01/17 0805) Pulse Rate:  [59-83] 67 (01/17 0805) Resp:  [16-18] 18 (01/17 0805) BP: (117-147)/(68-98) 117/68 (01/17 0805) SpO2:  [99 %-100 %] 100 % (01/17 0805) Last BM Date : 05/11/23  Intake/Output from previous day: 01/16 0701 - 01/17 0700 In: 120 [P.O.:120] Out: 100 [Emesis/NG output:100] Intake/Output this shift: No intake/output data recorded.  Gen: NAD, comfortable Pulm: Normal work of breathing Abd: Soft, mild distention.  No tenderness.  NG tube is thin gastric/saliva type output. Ext: SCDs in place  Lab Results: CBC  Recent Labs    05/09/23 0448  WBC 6.8  HGB 14.8  HCT 45.4  PLT 166   BMET Recent Labs    05/09/23 0448  NA 141  K 4.1  CL 107  CO2 26  GLUCOSE 129*  BUN 15  CREATININE 0.81  CALCIUM 9.1   PT/INR No results for input(s): "LABPROT", "INR" in the last 72 hours. ABG No results for input(s): "PHART", "HCO3" in the last 72 hours.  Invalid input(s): "PCO2", "PO2"  Studies/Results:  Anti-infectives: Anti-infectives (From admission, onward)    None        Assessment/Plan: Patient Active Problem List   Diagnosis Date Noted   SBO (small bowel obstruction) (HCC) 05/08/2023   Status post total replacement of left hip 02/23/2023   Bronchitis 09/13/2022   History of colonic polyps 02/03/2022   Family history of early CAD 07/02/2020   Lipoma of abdominal wall 08/04/2019   Osteopenia 01/28/2019   Allergic rhinitis 01/28/2019   Thrombocytopenia (HCC) 10/17/2017   Hyperlipidemia with target LDL less than 130 09/14/2015   Vaginal atrophy 06/01/2015   Cyst of pancreas 03/06/2015   Glaucoma  suspect of both eyes 03/11/2014   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 Tolerating diet Stable to dc from surgical standpoint  FEN: soft ID: none VTE: lovenox   LOS: 3 days   I reviewed hospitalist notes, last 24 h vitals and pain scores, last 48 h intake and output, last 24 h labs and trends, and last 24 h imaging results.    Eric Form, [facility] 05/11/2023, 9:54 AM Please see Amion for pager number during day hours 7:00am-4:30pm

## 2023-05-11 NOTE — Plan of Care (Signed)

## 2023-05-11 NOTE — Discharge Instructions (Addendum)
Alyssa Robinson,  You were in the hospital because of a bowel obstruction, likely related to your remote surgery. Thankfully, your obstruction has resolved with only the NG tube and you did not require surgical management. You have shown that you can tolerate a diet and are having bowel movements. Please follow-up with the surgeon as needed. Please follow-up with your PCP. It was a pleasure meeting you and taking care of you.

## 2023-05-11 NOTE — Care Management Important Message (Signed)
Important Message  Patient Details  Name: Alyssa Robinson MRN: [id number] Date of Birth: [birth year]   Important Message Given:  Yes - Medicare IM     Dorena Bodo 05/11/2023, 12:46 PM

## 2023-05-14 ENCOUNTER — Telehealth: Payer: Self-pay | Admitting: *Deleted

## 2023-05-14 NOTE — Transitions of Care (Post Inpatient/ED Visit) (Signed)
05/14/2023  Name: Alyssa Robinson MRN: [id number] DOB: Oct 17, 1950  Today's TOC FU Call Status: Today's TOC FU Call Status:: Successful TOC FU Call Completed TOC FU Call Complete Date: 05/14/23 Patient's Name and Date of Birth confirmed.  Transition Care Management Follow-up Telephone Call Date of Discharge: 05/11/23 Discharge Facility: [facility]) Type of Discharge: Inpatient Admission Primary Inpatient Discharge Diagnosis:: SBO without need for surgical intervention How have you been since you were released from the hospital?: Better ("I am fine; driving myself to the grocery store, not having any problems at all.  I am completely independent in taking care of myself") Any questions or concerns?: No  Items Reviewed: Did you receive and understand the discharge instructions provided?: Yes (thoroughly reviewed with patient who verbalizes good understanding of same) Medications obtained,verified, and reconciled?: Yes (Medications Reviewed) (Full medication reconciliation/ review completed; no concerns or discrepancies identified; confirmed no newly Rx'd medications post-hospital discharge on 05/11/23; self- manages medications and denies questions/ concerns around medications today) Any new allergies since your discharge?: No Dietary orders reviewed?: Yes Type of Diet Ordered:: "on the soft side; healthy as possible" Do you have support at home?: Yes People in Home: spouse Name of Support/Comfort Primary Source: Reports independent in self-care activities; supportive spouse assists as/ if needed/ indicated  Medications Reviewed Today: Medications Reviewed Today     Reviewed by Michaela Corner, RN (Registered Nurse) on 05/14/23 at 1148  Med List Status: <None>   Medication Order Taking? Sig Documenting Provider Last Dose Status Informant  loratadine (CLARITIN) 10 MG tablet [id number] Yes Take 10 mg by mouth daily. [provider] Taking Active Self  meclizine (ANTIVERT)  12.5 MG tablet [id number] No Take 12.5 mg by mouth 3 (three) times daily as needed for dizziness.  Patient not taking: Reported on 05/14/2023   [provider] Not Taking Active Self  Pediatric Multiple Vit-C-FA (PEDIATRIC MULTIVITAMIN) chewable tablet [id number] Yes Chew 1 tablet by mouth daily. [place]stone [provider] Taking Active Self  Probiotic Product [place] IMMUNE HEALTH PO) [id number] Yes Take 1 capsule by mouth daily. [provider] Taking Active Self  sodium chloride (OCEAN) 0.65 % SOLN nasal spray [id number] Yes Place 2-3 sprays into both nostrils 2 (two) times daily. [provider] Taking Active Self           Home Care and Equipment/Supplies: Were Home Health Services Ordered?: No Any new equipment or medical supplies ordered?: No  Functional Questionnaire: Do you need assistance with bathing/showering or dressing?: No Do you need assistance with meal preparation?: No Do you need assistance with eating?: No Do you have difficulty maintaining continence: No Do you need assistance with getting out of bed/getting out of a chair/moving?: No Do you have difficulty managing or taking your medications?: No  Follow up appointments reviewed: PCP Follow-up appointment confirmed?: Yes (care coordination outreach in real-time with scheduling care guide to successfully schedule hospital follow up PCP appointment 05/16/23) Date of PCP follow-up appointment?: 05/16/23 Follow-up Provider: PCP- [facility] Follow-up appointment confirmed?: Yes Date of Specialist follow-up appointment?: 05/18/23 Follow-Up Specialty Provider:: orthopedic provider Do you need transportation to your follow-up appointment?: No Do you understand care options if your condition(s) worsen?: Yes-patient verbalized understanding  SDOH Interventions Today    Flowsheet Row Most Recent Value  SDOH Interventions   Food Insecurity Interventions Intervention  Not Indicated  Housing Interventions Intervention Not Indicated  Transportation Interventions Intervention Not Indicated  [drives self]  Utilities Interventions Intervention Not Indicated  Interventions Today    Flowsheet Row Most Recent Value  Chronic Disease   Chronic disease during today's visit Other  [SBO without need for surgical intervention]  General Interventions   General Interventions Discussed/Reviewed General Interventions Discussed, Durable Medical Equipment (DME), Doctor Visits  Doctor Visits Discussed/Reviewed Doctor Visits Discussed, PCP, Specialist  Durable Medical Equipment (DME) Other  [confirmed not currently requiring/ using assistive devices for ambulation]  PCP/Specialist Visits Compliance with follow-up visit  Nutrition Interventions   Nutrition Discussed/Reviewed Nutrition Discussed  Pharmacy Interventions   Pharmacy Dicussed/Reviewed Pharmacy Topics Discussed  [Full medication review with updating medication list in EHR per patient report]       TOC Interventions Today    Flowsheet Row Most Recent Value  TOC Interventions   TOC Interventions Discussed/Reviewed TOC Interventions Discussed, Arranged PCP follow up within 7 days/Care Guide scheduled  [Patient declines need for ongoing/ further care management outreach,  declines enrollment in 30-day TOC program,  provided my direct contact information should questions/ concerns/ needs arise post-TOC call]      Caryl Pina, RN, BSN, CCRN Alumnus RN Care Manager  Transitions of Care  [facility] - Population Health  [place] [phone]: direct office

## 2023-05-16 ENCOUNTER — Encounter: Payer: Self-pay | Admitting: Internal Medicine

## 2023-05-16 ENCOUNTER — Ambulatory Visit: Payer: Medicare Other | Admitting: Internal Medicine

## 2023-05-16 VITALS — BP 136/88 | HR 71 | Temp 98.0°F | Ht 59.0 in | Wt 111.0 lb

## 2023-05-16 DIAGNOSIS — Z96642 Presence of left artificial hip joint: Secondary | ICD-10-CM

## 2023-05-16 DIAGNOSIS — K56609 Unspecified intestinal obstruction, unspecified as to partial versus complete obstruction: Secondary | ICD-10-CM

## 2023-05-16 NOTE — Patient Instructions (Signed)
You can try colace once a day in the evening

## 2023-05-16 NOTE — Assessment & Plan Note (Signed)
Doing well overall and we discussed if her immobility prior to surgery and while recovering mixed with medications after surgery could have impacted her recent SBO. It is possible but timed several months after the hip replacement makes it less likely.

## 2023-05-16 NOTE — Progress Notes (Signed)
   Subjective:   Patient ID: Alyssa Robinson, female    DOB: [birth year], 73 y.o.   MRN: [id number]  HPI The patient is a 73 YO female coming in for hospital follow up (in for SBO, conservative treatment with NG tube and suction had clinical improvement, likely related to adhesions from prior abdominal surgery). She also had hip replacement left hip Nov 2024. Doing well and gradually widening diet still mostly soft and low fiber. BM daily but not as often as prior to SBO yet. Some growling in stomach and mild nausea. Denies fevers or chills.  PMH, [facility], social history reviewed and updated  Medication reconciliation done during visit from d/c list.  Review of Systems  Constitutional: Negative.   HENT: Negative.    Eyes: Negative.   Respiratory:  Negative for cough, chest tightness and shortness of breath.   Cardiovascular:  Negative for chest pain, palpitations and leg swelling.  Gastrointestinal:  Negative for abdominal distention, abdominal pain, constipation, diarrhea, nausea and vomiting.  Musculoskeletal: Negative.   Skin: Negative.   Neurological: Negative.   Psychiatric/Behavioral: Negative.      Objective:  Physical Exam Constitutional:      Appearance: She is well-developed.  HENT:     Head: Normocephalic and atraumatic.  Cardiovascular:     Rate and Rhythm: Normal rate and regular rhythm.  Pulmonary:     Effort: Pulmonary effort is normal. No respiratory distress.     Breath sounds: Normal breath sounds. No wheezing or rales.  Abdominal:     General: Bowel sounds are normal. There is no distension.     Palpations: Abdomen is soft.     Tenderness: There is no abdominal tenderness. There is no rebound.  Musculoskeletal:     Cervical back: Normal range of motion.  Skin:    General: Skin is warm and dry.  Neurological:     Mental Status: She is alert and oriented to person, place, and time.     Coordination: Coordination normal.     Vitals:   05/16/23 0933  BP:  136/88  Pulse: 71  Temp: 98 F (36.7 C)  SpO2: 97%  Weight: 111 lb (50.3 kg)  Height: 4\' 11"  (1.499 m)    Assessment & Plan:  Visit time 20 minutes in face to face communication with patient and coordination of care, additional 10 minutes spent in record review, coordination or care, ordering tests, communicating/referring to other healthcare professionals, documenting in medical records all on the same day of the visit for total time 30 minutes spent on the visit.

## 2023-05-16 NOTE — Assessment & Plan Note (Signed)
Still resolved and counseled to reduce red meat lifelong. She will gradually add back fiber and fruits/veggies. She does have adhesions and this could happen again. She is instructed to start colace 1 pill daily until BM back to normal then as needed. Asked to monitor closely for change in bowels and restart colace for decreased bowels and if needed switch to soft/liquid for decrease/change in bowels.

## 2023-05-18 ENCOUNTER — Ambulatory Visit (INDEPENDENT_AMBULATORY_CARE_PROVIDER_SITE_OTHER): Payer: Medicare Other | Admitting: Orthopaedic Surgery

## 2023-05-18 DIAGNOSIS — Z96642 Presence of left artificial hip joint: Secondary | ICD-10-CM

## 2023-05-18 NOTE — Progress Notes (Signed)
Post-Op Visit Note   Patient: Alyssa Robinson           Date of Birth: [birth year]           MRN: [id number] Visit Date: 05/18/2023 PCP: Alyssa Broker, MD   Assessment & Plan:  Chief Complaint:  Chief Complaint  Patient presents with   Left Hip - Pain   Visit Diagnoses:  1. Status post total replacement of left hip     Plan: Alyssa Robinson is 41-month status post left total hip arthroplasty.  Feels some soreness at times.  Overall doing well has no complaints.  Unfortunately was recently hospitalized for a small bowel obstruction.  Examination left hip shows fluid painless range of motion.  Normal gait.  Fully healed surgical scar.  I am very happy with Alyssa Robinson's outcome.  She is released activity as tolerated.  I I feel that she can discontinue dental prophylaxis since she is low risk and she often gets yeast infections from the antibiotics.  Recheck in 3 months with repeat x-rays.  Follow-Up Instructions: Return in about 3 months (around 08/16/2023).   Orders:  No orders of the defined types were placed in this encounter.  No orders of the defined types were placed in this encounter.   Imaging: No results found.  PMFS History: Patient Active Problem List   Diagnosis Date Noted   SBO (small bowel obstruction) (HCC) 05/08/2023   Status post total replacement of left hip 02/23/2023   History of colonic polyps 02/03/2022   Family history of early CAD 07/02/2020   Lipoma of abdominal wall 08/04/2019   Osteopenia 01/28/2019   Allergic rhinitis 01/28/2019   Thrombocytopenia (HCC) 10/17/2017   Hyperlipidemia with target LDL less than 130 09/14/2015   Vaginal atrophy 06/01/2015   Cyst of pancreas 03/06/2015   Glaucoma suspect of both eyes 03/11/2014   Nuclear sclerosis of both eyes 03/11/2014   Routine general medical examination at a health care facility 12/11/2012   Essential hypertension, benign 12/11/2012   Past Medical History:  Diagnosis Date   Allergy 20-25  years ago   Anemia    hx   Arthritis 8-10 years ago   Blood transfusion without reported diagnosis 20 years ago   Bronchitis    Bronchitis    hx   Closed patellar sleeve fracture of left knee    Diverticulosis    GERD (gastroesophageal reflux disease)    occ   Headache(784.0)    migraines occ   Hypertension    no meds in over 51yrs   Pneumonia    Seasonal allergies     Family History  Problem Relation Age of Onset   Esophageal cancer Mother        and ovarian cancer   Alcohol abuse Mother    Arthritis Mother    Cancer Mother    Depression Mother    Heart disease Father    Alcohol abuse Father    Hypertension Father    Diabetes Father    Early death Father    Breast cancer Sister    Colon cancer Other    Pancreatic cancer Other    Cancer Maternal Aunt    Cancer Maternal Aunt    Stroke Neg Hx    Hyperlipidemia Neg Hx    Kidney disease Neg Hx    COPD Neg Hx     Past Surgical History:  Procedure Laterality Date   ABDOMINAL HYSTERECTOMY     73 y/o   APPENDECTOMY  03/17/2013  Procedure: INCIDENTAL APPENDECTOMY;  Surgeon: Cherylynn Ridges, MD;  Location: [facility] OR;  Service: General;;   capsule endoscopy     COLON SURGERY     COLONOSCOPY     in 2012 -    COLOSTOMY CLOSURE  03/17/2013   COLOSTOMY REVERSAL  03/17/2013   Procedure: COLOSTOMY REVERSAL;  Surgeon: Cherylynn Ridges, MD;  Location: [facility] OR;  Service: General;;   EUS N/A 04/30/2015   Procedure: UPPER ENDOSCOPIC ULTRASOUND (EUS) LINEAR;  Surgeon: Jeani Hawking, MD;  Location: [facility];  Service: Endoscopy;  Laterality: N/A;   HERNIA REPAIR     X2 2-4 y/o inguinal   JOINT REPLACEMENT  ORID Knee/right hip   2021/2022   LAPAROTOMY N/A 10/17/2012   Procedure: EXPLORATORY LAPAROTOMY,  COLON RESECTION AND COLOSTOMY;  Surgeon: Cherylynn Ridges, MD;  Location: [facility] OR;  Service: General;  Laterality: N/A;   LIPOMA EXCISION Left    left arm   LYSIS OF ADHESION  03/17/2013   Procedure: LYSIS OF ADHESION;  Surgeon: Cherylynn Ridges, MD;  Location: [facility] OR;  Service: General;;   ORIF PATELLA Left 02/20/2020   Procedure: OPEN REDUCTION INTERNAL (ORIF) FIXATION LEFT PATELLA;  Surgeon: Tarry Kos, MD;  Location: [facility];  Service: Orthopedics;  Laterality: Left;   SMALL INTESTINE SURGERY  Perforated colon   2014   TOTAL HIP ARTHROPLASTY Right 09/12/2021   Procedure: RIGHT TOTAL HIP ARTHROPLASTY ANTERIOR APPROACH;  Surgeon: Tarry Kos, MD;  Location: [facility] OR;  Service: Orthopedics;  Laterality: Right;  3-C   TOTAL HIP ARTHROPLASTY Left 02/23/2023   Procedure: LEFT TOTAL HIP ARTHROPLASTY ANTERIOR APPROACH;  Surgeon: Tarry Kos, MD;  Location: [facility];  Service: Orthopedics;  Laterality: Left;  3-C   UPPER GASTROINTESTINAL ENDOSCOPY     2013 -   Social History   Occupational History   Not on file  Tobacco Use   Smoking status: Never   Smokeless tobacco: Never   Tobacco comments:    Second hand smoke with mother/father smoking  Vaping Use   Vaping status: Never Used  Substance and Sexual Activity   Alcohol use: Not Currently    Alcohol/week: 2.0 standard drinks of alcohol    Types: 2 Glasses of wine per week   Drug use: No   Sexual activity: Yes    Birth control/protection: Post-menopausal

## 2023-05-28 ENCOUNTER — Ambulatory Visit: Payer: Self-pay | Admitting: Internal Medicine

## 2023-05-28 DIAGNOSIS — B379 Candidiasis, unspecified: Secondary | ICD-10-CM | POA: Diagnosis not present

## 2023-05-28 NOTE — Telephone Encounter (Signed)
  Chief Complaint: Rash Symptoms: itching, redness Frequency: began yesterday Pertinent Negatives: Patient denies fever, pain Disposition: [] ED /[] Urgent Care (no appt availability in office) / [x] Appointment(In office/virtual)/ []  [facility]/ [] Home Care/ [] Refused Recommended Disposition /[] [facility]/ []  Follow-up with PCP Additional Notes: Patient calls reporting rash that began around anus and is spreading on both buttocks, up to low back since yesterday. Patient states she has had this in the past and it was a fungal infection. States itching is 8/10. Protocol recommends eval within 24 hours. Scheduled for first available tomorrow morning with any provider at 0900 per patient request. Care advice reviewed, patient verbalized understanding. Patient states if symptoms worsen today she will go to urgent care for eval. Alerting PCP for review.   Reason for Disposition  [1] Looks infected (spreading redness, pus) AND [2] no fever  Answer Assessment - Initial Assessment Questions 1. APPEARANCE of RASH: "Describe the rash."      Red all over 2. LOCATION: "Where is the rash located?"      "Anal area" and both buttocks, moving up the middle towards low back 3. NUMBER: "How many spots are there?"      One big area vs a bunch of tiny spots 4. SIZE: "How big are the spots?" (Inches, centimeters or compare to size of a coin)      Covers both buttocks, moving up to the low back 5. ONSET: "When did the rash start?"      Yesterday 6. ITCHING: "Does the rash itch?" If Yes, ask: "How bad is the itch?"  (Scale 0-10; or none, mild, moderate, severe)     8/10 7. PAIN: "Does the rash hurt?" If Yes, ask: "How bad is the pain?"  (Scale 0-10; or none, mild, moderate, severe)    - NONE (0): no pain    - MILD (1-3): doesn't interfere with normal activities     - MODERATE (4-7): interferes with normal activities or awakens from sleep     - SEVERE (8-10): excruciating pain, unable to  do any normal activities     Denies pain 8. OTHER SYMPTOMS: "Do you have any other symptoms?" (e.g., fever)     Denies  Protocols used: Rash or Redness - Localized-A-AH

## 2023-05-29 ENCOUNTER — Encounter: Payer: Self-pay | Admitting: Family Medicine

## 2023-05-29 ENCOUNTER — Ambulatory Visit (INDEPENDENT_AMBULATORY_CARE_PROVIDER_SITE_OTHER): Payer: Medicare Other | Admitting: Family Medicine

## 2023-05-29 VITALS — BP 120/88 | HR 68 | Temp 97.6°F | Ht 59.0 in | Wt 108.8 lb

## 2023-05-29 DIAGNOSIS — B372 Candidiasis of skin and nail: Secondary | ICD-10-CM | POA: Diagnosis not present

## 2023-05-29 NOTE — Patient Instructions (Signed)
Continue current medication regimen with barrier cream and fluconazole.  Follow up with me as scheduled, sooner if needed.

## 2023-05-29 NOTE — Progress Notes (Signed)
   Acute Office Visit  Subjective:     Patient ID: Alyssa Robinson, female    DOB: 1950-05-03, 73 y.o.   MRN: [id number]  Chief Complaint  Patient presents with   Rash    Worsening rectal rash starting Sunday. Patient did visit urgent care facility yesterday which has since provided her with a Baza Antifungal topical cream and fluconazole.    HPI 73 year old female presents for evaluation of rash to her buttocks.  Reports that she has had a small bowel obstruction, and then now she has a rash to her bottom that is very red, itchy. Reports that she was seen at urgent care yesterday and was given antifungal barrier cream along with oral fluconazole. Reports that this is working very well.  She is much more comfortable. Denies any discharge or bleeding from the area. Denies other concerns today.    ROS Per HPI      Objective:    BP 120/88   Pulse 68   Temp 97.6 F (36.4 C)   Ht 4' 11 (1.499 m)   Wt 108 lb 12.8 oz (49.4 kg)   BMI 21.97 kg/m    Physical Exam Vitals and nursing note reviewed.  Constitutional:      General: She is not in acute distress.    Appearance: Normal appearance. She is normal weight.  HENT:     Head: Normocephalic and atraumatic.     Nose: Nose normal.  Eyes:     Extraocular Movements: Extraocular movements intact.  Pulmonary:     Effort: Pulmonary effort is normal.  Musculoskeletal:        General: Normal range of motion.     Cervical back: Normal range of motion.  Skin:    Findings: Rash present.          Comments: Area of erythematous, shiny rash with satellite lesions consistent with Candida.  No discharge, no bleeding, mildly tender  Neurological:     General: No focal deficit present.     Mental Status: She is alert and oriented to person, place, and time.  Psychiatric:        Mood and Affect: Mood normal.        Thought Content: Thought content normal.    No results found for any visits on 05/29/23.      Assessment & Plan:   1. Yeast dermatitis (Primary) - Continue barrier cream to the area - Take the second fluconazole tablet on Thursday - Follow up as scheduled, sooner if needed   No orders of the defined types were placed in this encounter.   Return for as scheduled.  Corean Ku, FNP

## 2023-06-02 ENCOUNTER — Emergency Department (HOSPITAL_COMMUNITY): Payer: Medicare Other

## 2023-06-02 ENCOUNTER — Other Ambulatory Visit: Payer: Self-pay

## 2023-06-02 ENCOUNTER — Inpatient Hospital Stay (HOSPITAL_COMMUNITY): Payer: Medicare Other

## 2023-06-02 ENCOUNTER — Encounter (HOSPITAL_COMMUNITY): Payer: Self-pay | Admitting: Emergency Medicine

## 2023-06-02 ENCOUNTER — Inpatient Hospital Stay (HOSPITAL_COMMUNITY)
Admission: EM | Admit: 2023-06-02 | Discharge: 2023-06-06 | DRG: 388 | Disposition: A | Discharge status: Home / Self Care | Payer: Medicare Other | Attending: Internal Medicine | Admitting: Internal Medicine

## 2023-06-02 DIAGNOSIS — D751 Secondary polycythemia: Secondary | ICD-10-CM | POA: Diagnosis present

## 2023-06-02 DIAGNOSIS — Z803 Family history of malignant neoplasm of breast: Secondary | ICD-10-CM

## 2023-06-02 DIAGNOSIS — Z79899 Other long term (current) drug therapy: Secondary | ICD-10-CM

## 2023-06-02 DIAGNOSIS — K7689 Other specified diseases of liver: Secondary | ICD-10-CM | POA: Diagnosis not present

## 2023-06-02 DIAGNOSIS — E785 Hyperlipidemia, unspecified: Secondary | ICD-10-CM | POA: Diagnosis present

## 2023-06-02 DIAGNOSIS — Z96643 Presence of artificial hip joint, bilateral: Secondary | ICD-10-CM | POA: Diagnosis not present

## 2023-06-02 DIAGNOSIS — E876 Hypokalemia: Secondary | ICD-10-CM | POA: Diagnosis present

## 2023-06-02 DIAGNOSIS — D696 Thrombocytopenia, unspecified: Secondary | ICD-10-CM | POA: Diagnosis present

## 2023-06-02 DIAGNOSIS — Z8261 Family history of arthritis: Secondary | ICD-10-CM

## 2023-06-02 DIAGNOSIS — Z811 Family history of alcohol abuse and dependence: Secondary | ICD-10-CM

## 2023-06-02 DIAGNOSIS — J309 Allergic rhinitis, unspecified: Secondary | ICD-10-CM | POA: Diagnosis present

## 2023-06-02 DIAGNOSIS — Z9102 Food additives allergy status: Secondary | ICD-10-CM | POA: Diagnosis not present

## 2023-06-02 DIAGNOSIS — Z833 Family history of diabetes mellitus: Secondary | ICD-10-CM

## 2023-06-02 DIAGNOSIS — H409 Unspecified glaucoma: Secondary | ICD-10-CM | POA: Diagnosis present

## 2023-06-02 DIAGNOSIS — Z887 Allergy status to serum and vaccine status: Secondary | ICD-10-CM

## 2023-06-02 DIAGNOSIS — R739 Hyperglycemia, unspecified: Secondary | ICD-10-CM | POA: Diagnosis not present

## 2023-06-02 DIAGNOSIS — Z8041 Family history of malignant neoplasm of ovary: Secondary | ICD-10-CM | POA: Diagnosis not present

## 2023-06-02 DIAGNOSIS — K219 Gastro-esophageal reflux disease without esophagitis: Secondary | ICD-10-CM | POA: Diagnosis present

## 2023-06-02 DIAGNOSIS — Z96651 Presence of right artificial knee joint: Secondary | ICD-10-CM | POA: Diagnosis present

## 2023-06-02 DIAGNOSIS — K56609 Unspecified intestinal obstruction, unspecified as to partial versus complete obstruction: Principal | ICD-10-CM

## 2023-06-02 DIAGNOSIS — E43 Unspecified severe protein-calorie malnutrition: Secondary | ICD-10-CM | POA: Diagnosis present

## 2023-06-02 DIAGNOSIS — E441 Mild protein-calorie malnutrition: Secondary | ICD-10-CM | POA: Insufficient documentation

## 2023-06-02 DIAGNOSIS — Z4682 Encounter for fitting and adjustment of non-vascular catheter: Secondary | ICD-10-CM | POA: Diagnosis not present

## 2023-06-02 DIAGNOSIS — B3749 Other urogenital candidiasis: Secondary | ICD-10-CM | POA: Diagnosis present

## 2023-06-02 DIAGNOSIS — Z800 Family history of malignant neoplasm of digestive organs: Secondary | ICD-10-CM | POA: Diagnosis not present

## 2023-06-02 DIAGNOSIS — Z9049 Acquired absence of other specified parts of digestive tract: Secondary | ICD-10-CM

## 2023-06-02 DIAGNOSIS — I10 Essential (primary) hypertension: Secondary | ICD-10-CM | POA: Diagnosis present

## 2023-06-02 DIAGNOSIS — Z8249 Family history of ischemic heart disease and other diseases of the circulatory system: Secondary | ICD-10-CM

## 2023-06-02 DIAGNOSIS — Z818 Family history of other mental and behavioral disorders: Secondary | ICD-10-CM

## 2023-06-02 DIAGNOSIS — Z888 Allergy status to other drugs, medicaments and biological substances status: Secondary | ICD-10-CM

## 2023-06-02 DIAGNOSIS — K5669 Other partial intestinal obstruction: Secondary | ICD-10-CM | POA: Diagnosis not present

## 2023-06-02 DIAGNOSIS — Z6820 Body mass index (BMI) 20.0-20.9, adult: Secondary | ICD-10-CM | POA: Diagnosis not present

## 2023-06-02 DIAGNOSIS — B3731 Acute candidiasis of vulva and vagina: Secondary | ICD-10-CM | POA: Insufficient documentation

## 2023-06-02 DIAGNOSIS — Z809 Family history of malignant neoplasm, unspecified: Secondary | ICD-10-CM

## 2023-06-02 LAB — CBC
HCT: 50.7 % — ABNORMAL HIGH (ref 36.0–46.0)
Hemoglobin: 16.5 g/dL — ABNORMAL HIGH (ref 12.0–15.0)
MCH: 31.5 pg (ref 26.0–34.0)
MCHC: 32.5 g/dL (ref 30.0–36.0)
MCV: 96.9 fL (ref 80.0–100.0)
Platelets: 152 10*3/uL (ref 150–400)
RBC: 5.23 MIL/uL — ABNORMAL HIGH (ref 3.87–5.11)
RDW: 13.2 % (ref 11.5–15.5)
WBC: 9.5 10*3/uL (ref 4.0–10.5)
nRBC: 0 % (ref 0.0–0.2)

## 2023-06-02 LAB — URINALYSIS, ROUTINE W REFLEX MICROSCOPIC
Bilirubin Urine: NEGATIVE
Glucose, UA: NEGATIVE mg/dL
Ketones, ur: 20 mg/dL — AB
Leukocytes,Ua: NEGATIVE
Nitrite: NEGATIVE
Protein, ur: 100 mg/dL — AB
Specific Gravity, Urine: 1.024 (ref 1.005–1.030)
pH: 5 (ref 5.0–8.0)

## 2023-06-02 LAB — COMPREHENSIVE METABOLIC PANEL WITH GFR
ALT: 16 U/L (ref 0–44)
AST: 22 U/L (ref 15–41)
Albumin: 4.7 g/dL (ref 3.5–5.0)
Alkaline Phosphatase: 95 U/L (ref 38–126)
Anion gap: 10 (ref 5–15)
BUN: 14 mg/dL (ref 8–23)
CO2: 25 mmol/L (ref 22–32)
Calcium: 9.9 mg/dL (ref 8.9–10.3)
Chloride: 102 mmol/L (ref 98–111)
Creatinine, Ser: 0.82 mg/dL (ref 0.44–1.00)
GFR, Estimated: >60 mL/min
Glucose, Bld: 189 mg/dL — ABNORMAL HIGH (ref 70–99)
Potassium: 4.5 mmol/L (ref 3.5–5.1)
Sodium: 137 mmol/L (ref 135–145)
Total Bilirubin: 0.8 mg/dL (ref 0.0–1.2)
Total Protein: 7.7 g/dL (ref 6.5–8.1)

## 2023-06-02 LAB — LIPASE, BLOOD: Lipase: 25 U/L (ref 11–51)

## 2023-06-02 MED ORDER — SODIUM CHLORIDE 0.9 % IV BOLUS
1000.0000 mL | Freq: Once | INTRAVENOUS | Status: AC
  Administered 2023-06-02: 1000 mL via INTRAVENOUS

## 2023-06-02 MED ORDER — ACETAMINOPHEN 325 MG PO TABS: 650.0000 mg | ORAL_TABLET | Freq: Four times a day (QID) | ORAL | Status: DC | PRN

## 2023-06-02 MED ORDER — ONDANSETRON HCL 4 MG PO TABS: 4.0000 mg | ORAL_TABLET | Freq: Four times a day (QID) | ORAL | Status: DC | PRN

## 2023-06-02 MED ORDER — IOHEXOL 300 MG/ML  SOLN
100.0000 mL | Freq: Once | INTRAMUSCULAR | Status: AC | PRN
  Administered 2023-06-02: 100 mL via INTRAVENOUS

## 2023-06-02 MED ORDER — ONDANSETRON HCL 4 MG/2ML IJ SOLN
4.0000 mg | Freq: Once | INTRAMUSCULAR | Status: AC
  Administered 2023-06-02: 4 mg via INTRAVENOUS
  Filled 2023-06-02: qty 2

## 2023-06-02 MED ORDER — ONDANSETRON 4 MG PO TBDP
ORAL_TABLET | ORAL | Status: AC
  Administered 2023-06-02: 4 mg via ORAL
  Filled 2023-06-02: qty 1

## 2023-06-02 MED ORDER — DIATRIZOATE MEGLUMINE & SODIUM 66-10 % PO SOLN
90.0000 mL | Freq: Once | ORAL | Status: AC
  Administered 2023-06-02: 90 mL via NASOGASTRIC
  Filled 2023-06-02: qty 90

## 2023-06-02 MED ORDER — SODIUM CHLORIDE 0.9 % IV SOLN: INTRAVENOUS | Status: AC

## 2023-06-02 MED ORDER — ONDANSETRON HCL 4 MG/2ML IJ SOLN
4.0000 mg | Freq: Four times a day (QID) | INTRAMUSCULAR | Status: DC | PRN
  Administered 2023-06-02 – 2023-06-03 (×2): 4 mg via INTRAVENOUS
  Filled 2023-06-02 (×2): qty 2

## 2023-06-02 MED ORDER — ACETAMINOPHEN 10 MG/ML IV SOLN
1000.0000 mg | Freq: Four times a day (QID) | INTRAVENOUS | Status: AC
  Administered 2023-06-02 – 2023-06-03 (×3): 1000 mg via INTRAVENOUS
  Filled 2023-06-02 (×4): qty 100

## 2023-06-02 MED ORDER — ACETAMINOPHEN 650 MG RE SUPP: 650.0000 mg | Freq: Four times a day (QID) | RECTAL | Status: DC | PRN

## 2023-06-02 MED ORDER — LABETALOL HCL 5 MG/ML IV SOLN: 10.0000 mg | INTRAVENOUS | Status: DC | PRN

## 2023-06-02 MED ORDER — PANTOPRAZOLE SODIUM 40 MG IV SOLR
40.0000 mg | Freq: Once | INTRAVENOUS | Status: AC
  Administered 2023-06-02: 40 mg via INTRAVENOUS
  Filled 2023-06-02: qty 10

## 2023-06-02 MED ORDER — ONDANSETRON 4 MG PO TBDP: 4.0000 mg | ORAL_TABLET | Freq: Once | ORAL | Status: AC | PRN

## 2023-06-02 NOTE — ED Triage Notes (Signed)
 Pt in with abdominal pain since 8pm last night. Pt states she had drank some tart cherry juice for sleep, and began to have abdominal pain soon after, then recurrently vomiting since midnight. Emesis is red in color. Pt was recently hospitalized for SBO from 1/14-1/17, required NGT. Since home, pt has advanced to soft foods and has had no issues until last night. Reports 2 BM's yesterday but is unable to pass gas now.

## 2023-06-02 NOTE — H&P (Signed)
 History and Physical    Patient: Alyssa Robinson FMW:[id number] DOB: May 17, 1950 DOA: 06/02/2023 DOS: the patient was seen and examined on 06/02/2023 PCP: Rollene Almarie LABOR, MD  Patient coming from: Home  Chief Complaint:  Chief Complaint  Patient presents with   Abdominal Pain   Emesis   HPI: Alyssa Robinson is a 73 y.o. female with medical history significant of seasonal allergies, anemia, sertraline, bronchitis, close bilateral sleep fracture of the left knee, diverticulosis, GERD, migraine headaches, hypertension, pneumonia, history of small bowel obstruction who presented to the emergency department with complaints of abdominal pain, nausea and emesis similar to the symptoms she experienced with a recent small bowel obstruction. No diarrhea, constipation, melena or hematochezia. No flank pain, dysuria, frequency or hematuria. She denied fever, chills, rhinorrhea, sore throat, wheezing or hemoptysis. No chest pain, palpitations, diaphoresis, PND, orthopnea or pitting edema of the lower extremities. No polyuria, polydipsia, polyphagia or blurred vision.   Lab work: Urinalysis shows small hemoglobin, ketones of 20 and protein of 100 mg/deciliter.  There was rare bacteria microscopic examination.  CBC showed a white count of 9.5, hemoglobin 16.5 g/dL platelets 847.  Lipase was normal.  CMP showed a glucose of 189 mg/dL, but was otherwise unremarkable.  Imaging: CT abdomen and/pelvis with contrast showing a distal SBO, with transition point in the right lower quadrant likely due to ideation.  No mass or inflammatory process identified.   ED course: Initial vital signs were temperature 97.5 F, pulse 72, respirations 16, BP 151/100 mmHg O2 sat 100% on room air.  The patient received 4 mg ondansetron  ODT, ondansetron  4 mg IVP x 1 and 1000 mL of normal saline bolus.  Review of Systems: As mentioned in the history of present illness. All other systems reviewed and are negative. Past Medical History:   Diagnosis Date   Allergy 20-25 years ago   Anemia    hx   Arthritis 8-10 years ago   Blood transfusion without reported diagnosis 20 years ago   Bronchitis    Bronchitis    hx   Closed patellar sleeve fracture of left knee    Diverticulosis    GERD (gastroesophageal reflux disease)    occ   Headache(784.0)    migraines occ   Hypertension    no meds in over 25yrs   Pneumonia    Seasonal allergies    Past Surgical History:  Procedure Laterality Date   ABDOMINAL HYSTERECTOMY     73 y/o   APPENDECTOMY  03/17/2013   Procedure: INCIDENTAL APPENDECTOMY;  Surgeon: Lynwood MALVA Pina, MD;  Location: [facility] OR;  Service: General;;   capsule endoscopy     COLON SURGERY     COLONOSCOPY     in 2012 -    COLOSTOMY CLOSURE  03/17/2013   COLOSTOMY REVERSAL  03/17/2013   Procedure: COLOSTOMY REVERSAL;  Surgeon: Lynwood MALVA Pina, MD;  Location: [facility] OR;  Service: General;;   EUS N/A 04/30/2015   Procedure: UPPER ENDOSCOPIC ULTRASOUND (EUS) LINEAR;  Surgeon: Belvie Just, MD;  Location: [facility];  Service: Endoscopy;  Laterality: N/A;   HERNIA REPAIR     X2 2-4 y/o inguinal   JOINT REPLACEMENT  ORID Knee/right hip   2021/2022   LAPAROTOMY N/A 10/17/2012   Procedure: EXPLORATORY LAPAROTOMY,  COLON RESECTION AND COLOSTOMY;  Surgeon: Lynwood MALVA Pina, MD;  Location: [facility] OR;  Service: General;  Laterality: N/A;   LIPOMA EXCISION Left    left arm   LYSIS OF ADHESION  03/17/2013  Procedure: LYSIS OF ADHESION;  Surgeon: Lynwood MALVA Pina, MD;  [facility] OR;  Service: General;;   ORIF PATELLA Left 02/20/2020   Procedure: OPEN REDUCTION INTERNAL (ORIF) FIXATION LEFT PATELLA;  Surgeon: Jerri Kay HERO, MD;  Location: [facility];  Service: Orthopedics;  Laterality: Left;   SMALL INTESTINE SURGERY  Perforated colon   2014   TOTAL HIP ARTHROPLASTY Right 09/12/2021   Procedure: RIGHT TOTAL HIP ARTHROPLASTY ANTERIOR APPROACH;  Surgeon: Jerri Kay HERO, MD;  Location: [facility] OR;  Service: Orthopedics;   Laterality: Right;  3-C   TOTAL HIP ARTHROPLASTY Left 02/23/2023   Procedure: LEFT TOTAL HIP ARTHROPLASTY ANTERIOR APPROACH;  Surgeon: Jerri Kay HERO, MD;  Location: [facility];  Service: Orthopedics;  Laterality: Left;  3-C   UPPER GASTROINTESTINAL ENDOSCOPY     2013 -   Social History:  reports that she has never smoked. She has never used smokeless tobacco. She reports that she does not currently use alcohol after a past usage of about 2.0 standard drinks of alcohol per week. She reports that she does not use drugs.  Allergies  Allergen Reactions   Caffeine Nausea And Vomiting and Palpitations    Makes heart race    Fluad Quadrivalent [Influenza Vac A&B Sa Adj Quad] Nausea And Vomiting    Pt allergic to the 65 and over version of the flu vaccine, tolerates regular flu vaccine    Sodium Lauryl Sulfate Rash    Swollen gums     Family History  Problem Relation Age of Onset   Esophageal cancer Mother        and ovarian cancer   Alcohol abuse Mother    Arthritis Mother    Cancer Mother    Depression Mother    Heart disease Father    Alcohol abuse Father    Hypertension Father    Diabetes Father    Early death Father    Breast cancer Sister    Colon cancer Other    Pancreatic cancer Other    Cancer Maternal Aunt    Cancer Maternal Aunt    Stroke Neg Hx    Hyperlipidemia Neg Hx    Kidney disease Neg Hx    COPD Neg Hx     Prior to Admission medications   Medication Sig Start Date End Date Taking? Authorizing Provider  barrier cream (NON-SPECIFIED) CREA Apply 1 Application topically 2 (two) times daily as needed.    [provider]  fluconazole (DIFLUCAN) 150 MG tablet Take 150 mg by mouth daily.    [provider]  loratadine (CLARITIN) 10 MG tablet Take 10 mg by mouth daily.    [provider]  Pediatric Multiple Vit-C-FA (PEDIATRIC MULTIVITAMIN) chewable tablet Chew 1 tablet by mouth daily. [place]    [provider]  Probiotic Product  (ULTRAFLORA IMMUNE HEALTH PO) Take 1 capsule by mouth daily.    [provider]  sodium chloride  (OCEAN) 0.65 % SOLN nasal spray Place 2-3 sprays into both nostrils 2 (two) times daily.    [provider]    Physical Exam: Vitals:   06/02/23 0609 06/02/23 0616 06/02/23 1037  BP: (!) 151/100  (!) 150/71  Pulse: 72  82  Resp: 16  16  Temp: (!) 97.5 F (36.4 C)  98.4 F (36.9 C)  TempSrc: Oral    SpO2: 100%  100%  Weight:  49.4 kg    Physical Exam Vitals and nursing note reviewed.  Constitutional:  General: She is not in acute distress.    Appearance: She is well-developed. She is ill-appearing.     Comments: NGT in place.  HENT:     Head: Normocephalic.     Nose: No rhinorrhea.     Mouth/Throat:     Mouth: Mucous membranes are dry.  Eyes:     General: No scleral icterus.    Pupils: Pupils are equal, round, and reactive to light.  Neck:     Vascular: No JVD.  Cardiovascular:     Rate and Rhythm: Normal rate and regular rhythm.     Heart sounds: S1 normal and S2 normal.  Pulmonary:     Effort: Pulmonary effort is normal.     Breath sounds: No wheezing, rhonchi or rales.  Abdominal:     General: Bowel sounds are normal.     Palpations: Abdomen is soft.     Tenderness: There is abdominal tenderness. There is no right CVA tenderness, left CVA tenderness, guarding or rebound.  Musculoskeletal:     Cervical back: Neck supple.     Right lower leg: No edema.     Left lower leg: No edema.  Skin:    General: Skin is warm and dry.  Neurological:     General: No focal deficit present.     Mental Status: She is alert and oriented to person, place, and time.  Psychiatric:        Mood and Affect: Mood normal.        Behavior: Behavior normal. Behavior is cooperative.     Data Reviewed:  Results are pending, will review when available.  Assessment and Plan: Principal Problem:   SBO (small bowel obstruction) (HCC) Inpatient/telemetry. Keep  NPO. Continue NTG at LIS. Continue IV fluids. Analgesics as needed. Antiemetics as needed. Pantoprazole  40 mg IVP every 24 hours. Keep electrolytes optimized. Follow-up CBC and CMP in AM. Follow-up imaging in the morning. General surgery input appreciated.  Active Problems:   Hyperglycemia Check fasting glucose in AM. Further workup depending on results.    Essential hypertension, benign Currently NPO. IV labetalol  as needed.    Erythrocytosis Followed H&H in AM.    Hyperlipidemia with target LDL less than 130 Currently not on medical therapy. Follow-up with primary care provider.    Allergic rhinitis Resume loratadine once cleared for oral intake.      Advance Care Planning:   Code Status: Full Code   Consults: [facility].  Family Communication:   Severity of Illness: The appropriate patient status for this patient is INPATIENT. Inpatient status is judged to be reasonable and necessary in order to provide the required intensity of service to ensure the patient's safety. The patient's presenting symptoms, physical exam findings, and initial radiographic and laboratory data in the context of their chronic comorbidities is felt to place them at high risk for further clinical deterioration. Furthermore, it is not anticipated that the patient will be medically stable for discharge from the hospital within 2 midnights of admission.   * I certify that at the point of admission it is my clinical judgment that the patient will require inpatient hospital care spanning beyond 2 midnights from the point of admission due to high intensity of service, high risk for further deterioration and high frequency of surveillance required.*  Author: Alm Dorn Castor, MD 06/02/2023 11:31 AM  For on call review [email or web address].   This document was prepared using Dragon voice recognition software and may contain some unintended transcription errors.

## 2023-06-02 NOTE — Consult Note (Signed)
 CC: SBO  Requesting physician: Mliss Boyers MD .   HPI: Alyssa Robinson is an 73 y.o. female with hx of vertigo, HTN, HLD, glaucoma, prior left colectomy/colostomy with Dr. Kimble 2014 (perforated sigmoid) followed by colostomy takedown later that year that has re-presented to ED today with crampy abdominal pain and similar symptoms to a small bowel obstruction that she has recently experienced.  The pain began last night around 8 PM.  She does report that she had some cherry juice and attempt to help her sleep but began having some abdominal pain shortly after.  Has developed nausea and vomiting that occurred starting around midnight.  She was recently admitted to the hospital about a month ago and I actually seen her in consultation at that time on her initial presentation 05/08/23.  She had a small bowel obstruction.  By our documentation, she rapidly improved and was subsequently discharged home 05/11/2023.  Currently, reports significant improvement in discomfort following NG tube placement.  Denies any abdominal pain at present.  Her husband is present at bedside.  Past Medical History:  Diagnosis Date   Allergy 20-25 years ago   Anemia    hx   Arthritis 8-10 years ago   Blood transfusion without reported diagnosis 20 years ago   Bronchitis    Bronchitis    hx   Closed patellar sleeve fracture of left knee    Diverticulosis    GERD (gastroesophageal reflux disease)    occ   Headache(784.0)    migraines occ   Hypertension    no meds in over 35yrs   Pneumonia    Seasonal allergies     Past Surgical History:  Procedure Laterality Date   ABDOMINAL HYSTERECTOMY     73 y/o   APPENDECTOMY  03/17/2013   Procedure: INCIDENTAL APPENDECTOMY;  Surgeon: Lynwood MALVA Kimble, MD;  Location: [facility] OR;  Service: General;;   capsule endoscopy     COLON SURGERY     COLONOSCOPY     in 2012 -    COLOSTOMY CLOSURE  03/17/2013   COLOSTOMY REVERSAL  03/17/2013   Procedure: COLOSTOMY REVERSAL;   Surgeon: Lynwood MALVA Kimble, MD;  Location: [facility] OR;  Service: General;;   EUS N/A 04/30/2015   Procedure: UPPER ENDOSCOPIC ULTRASOUND (EUS) LINEAR;  Surgeon: Belvie Just, MD;  Location: [facility];  Service: Endoscopy;  Laterality: N/A;   HERNIA REPAIR     X2 2-4 y/o inguinal   JOINT REPLACEMENT  ORID Knee/right hip   2021/2022   LAPAROTOMY N/A 10/17/2012   Procedure: EXPLORATORY LAPAROTOMY,  COLON RESECTION AND COLOSTOMY;  Surgeon: Lynwood MALVA Kimble, MD;  Location: [facility] OR;  Service: General;  Laterality: N/A;   LIPOMA EXCISION Left    left arm   LYSIS OF ADHESION  03/17/2013   Procedure: LYSIS OF ADHESION;  Surgeon: Lynwood MALVA Kimble, MD;  Location: [facility] OR;  Service: General;;   ORIF PATELLA Left 02/20/2020   Procedure: OPEN REDUCTION INTERNAL (ORIF) FIXATION LEFT PATELLA;  Surgeon: Jerri Kay HERO, MD;  Location: [facility];  Service: Orthopedics;  Laterality: Left;   SMALL INTESTINE SURGERY  Perforated colon   2014   TOTAL HIP ARTHROPLASTY Right 09/12/2021   Procedure: RIGHT TOTAL HIP ARTHROPLASTY ANTERIOR APPROACH;  Surgeon: Jerri Kay HERO, MD;  Location: [facility] OR;  Service: Orthopedics;  Laterality: Right;  3-C   TOTAL HIP ARTHROPLASTY Left 02/23/2023   Procedure: LEFT TOTAL HIP ARTHROPLASTY ANTERIOR APPROACH;  Surgeon: Jerri Kay HERO, MD;  Location:  [facility] OR;  Service: Orthopedics;  Laterality: Left;  3-C   UPPER GASTROINTESTINAL ENDOSCOPY     2013 -    Family History  Problem Relation Age of Onset   Esophageal cancer Mother        and ovarian cancer   Alcohol abuse Mother    Arthritis Mother    Cancer Mother    Depression Mother    Heart disease Father    Alcohol abuse Father    Hypertension Father    Diabetes Father    Early death Father    Breast cancer Sister    Colon cancer Other    Pancreatic cancer Other    Cancer Maternal Aunt    Cancer Maternal Aunt    Stroke Neg Hx    Hyperlipidemia Neg Hx    Kidney disease Neg Hx    COPD Neg Hx     Social:  reports that she  has never smoked. She has never used smokeless tobacco. She reports that she does not currently use alcohol after a past usage of about 2.0 standard drinks of alcohol per week. She reports that she does not use drugs.  Allergies:  Allergies  Allergen Reactions   Caffeine Nausea And Vomiting and Palpitations    Makes heart race    Fluad Quadrivalent [Influenza Vac A&B Sa Adj Quad] Nausea And Vomiting    Pt allergic to the 65 and over version of the flu vaccine, tolerates regular flu vaccine    Sodium Lauryl Sulfate Rash    Swollen gums     Medications: I have reviewed the patient's current medications.  Results for orders placed or performed during the hospital encounter of 06/02/23 (from the past 48 hours)  Lipase, blood     Status: None   Collection Time: 06/02/23  6:27 AM  Result Value Ref Range   Lipase 25 11 - 51 U/L    Comment: Performed at [facility], [address], [place], [place] [address]  Comprehensive metabolic panel     Status: Abnormal   Collection Time: 06/02/23  6:27 AM  Result Value Ref Range   Sodium 137 135 - 145 mmol/L   Potassium 4.5 3.5 - 5.1 mmol/L   Chloride 102 98 - 111 mmol/L   CO2 25 22 - 32 mmol/L   Glucose, Bld 189 (H) 70 - 99 mg/dL    Comment: Glucose reference range applies only to samples taken after fasting for at least 8 hours.   BUN 14 8 - 23 mg/dL   Creatinine, Ser 9.17 0.44 - 1.00 mg/dL   Calcium  9.9 8.9 - 10.3 mg/dL   Total Protein 7.7 6.5 - 8.1 g/dL   Albumin  4.7 3.5 - 5.0 g/dL   AST 22 15 - 41 U/L   ALT 16 0 - 44 U/L   Alkaline Phosphatase 95 38 - 126 U/L   Total Bilirubin 0.8 0.0 - 1.2 mg/dL   GFR, Estimated >39 >39 mL/min    Comment: (NOTE) Calculated using the CKD-EPI Creatinine Equation (2021)    Anion gap 10 5 - 15    Comment: Performed at [facility], [address], [place], [place] [address]  CBC     Status: Abnormal   Collection Time: 06/02/23  6:27 AM  Result Value Ref Range    WBC 9.5 4.0 - 10.5 K/uL   RBC 5.23 (H) 3.87 - 5.11 MIL/uL   Hemoglobin 16.5 (H) 12.0 - 15.0 g/dL   HCT 49.2 (H)  36.0 - 46.0 %   MCV 96.9 80.0 - 100.0 fL   MCH 31.5 26.0 - 34.0 pg   MCHC 32.5 30.0 - 36.0 g/dL   RDW 86.7 88.4 - 84.4 %   Platelets 152 150 - 400 K/uL    Comment: SPECIMEN CHECKED FOR CLOTS REPEATED TO VERIFY    nRBC 0.0 0.0 - 0.2 %    Comment: Performed at [facility], [address] [place], [place] [address]  Urinalysis, Routine w reflex microscopic -Urine, Clean Catch     Status: Abnormal   Collection Time: 06/02/23  7:47 AM  Result Value Ref Range   Color, Urine AMBER (A) YELLOW    Comment: BIOCHEMICALS MAY BE AFFECTED BY COLOR   APPearance HAZY (A) CLEAR   Specific Gravity, Urine 1.024 1.005 - 1.030   pH 5.0 5.0 - 8.0   Glucose, UA NEGATIVE NEGATIVE mg/dL   Hgb urine dipstick SMALL (A) NEGATIVE   Bilirubin Urine NEGATIVE NEGATIVE   Ketones, ur 20 (A) NEGATIVE mg/dL   Protein, ur 899 (A) NEGATIVE mg/dL   Nitrite NEGATIVE NEGATIVE   Leukocytes,Ua NEGATIVE NEGATIVE   RBC / HPF 11-20 0 - 5 RBC/hpf   WBC, UA 0-5 0 - 5 WBC/hpf   Bacteria, UA RARE (A) NONE SEEN   Squamous Epithelial / HPF 0-5 0 - 5 /HPF   Mucus PRESENT    Hyaline Casts, UA PRESENT     Comment: Performed at [facility], [address] [place], [place] [address]    CT ABDOMEN PELVIS W CONTRAST Result Date: 06/02/2023 CLINICAL DATA:  Acute abdominal pain beginning last night. Vomiting. EXAM: CT ABDOMEN AND PELVIS WITH CONTRAST TECHNIQUE: Multidetector CT imaging of the abdomen and pelvis was performed using the standard protocol following bolus administration of intravenous contrast. RADIATION DOSE REDUCTION: This exam was performed according to the departmental dose-optimization program which includes automated exposure control, adjustment of the mA and/or kV according to patient size and/or use of iterative reconstruction technique. CONTRAST:  OMNIPAQUE   IOHEXOL  300 MG/ML  SOLN COMPARISON:  05/08/2023 FINDINGS: Lower Chest: No acute findings.  Pectus excavatum again noted. Hepatobiliary: No suspicious hepatic masses identified. Stable small right hepatic lobe cyst. Gallbladder is unremarkable. No evidence of biliary ductal dilatation. Pancreas:  No mass or inflammatory changes. Spleen: Within normal limits in size and appearance. Adrenals/Urinary Tract: No suspicious masses identified. No evidence of ureteral calculi or hydronephrosis. Stomach/Bowel: Surgical anastomosis again seen in rectosigmoid colon. Moderately dilated small bowel loops are again seen with air-fluid levels mildly increased since previous study. Transition point to nondilated small bowel loops seen in the right lower quadrant, consistent with distal small-bowel obstruction and likely due to adhesion. No mass, inflammatory process, abscess identified. Vascular/Lymphatic: No pathologically enlarged lymph nodes. No acute vascular findings. Reproductive: Poorly visualized due to severe artifact from bilateral hip prostheses. Other:  None. Musculoskeletal:  No suspicious bone lesions identified. IMPRESSION: Distal small-bowel obstruction, with transition point in right lower quadrant likely due to adhesion. No mass or inflammatory process identified. Electronically Signed   By: Norleen DELENA Kil M.D.   On: 06/02/2023 09:55    ROS - all of the below systems have been reviewed with the patient and positives are indicated with bold text General: chills, fever or night sweats Eyes: blurry vision or double vision ENT: epistaxis or sore throat Allergy/Immunology: itchy/watery eyes or nasal congestion Hematologic/Lymphatic: bleeding problems, blood clots or swollen lymph nodes Endocrine: temperature intolerance or unexpected weight changes Breast:  new or changing breast lumps or nipple discharge Resp: cough, shortness of breath, or wheezing CV: chest pain or dyspnea on exertion GI: as per HPI GU:  dysuria, trouble voiding, or hematuria MSK: joint pain or joint stiffness Neuro: TIA or stroke symptoms Derm: pruritus and skin lesion changes Psych: anxiety and depression  PE Blood pressure (!) 150/71, pulse 82, temperature 98.4 F (36.9 C), resp. rate 16, weight 49.4 kg, SpO2 100%.  Constitutional: NAD; conversant Eyes: Moist conjunctiva Lungs: Normal respiratory effort CV: RRR GI: Abd soft, nontender throughout, mildly distended; no rebound nor guarding Psychiatric: Appropriate affect  Results for orders placed or performed during the hospital encounter of 06/02/23 (from the past 48 hours)  Lipase, blood     Status: None   Collection Time: 06/02/23  6:27 AM  Result Value Ref Range   Lipase 25 11 - 51 U/L    Comment: Performed at [facility], [address], [place], [place] [address]  Comprehensive metabolic panel     Status: Abnormal   Collection Time: 06/02/23  6:27 AM  Result Value Ref Range   Sodium 137 135 - 145 mmol/L   Potassium 4.5 3.5 - 5.1 mmol/L   Chloride 102 98 - 111 mmol/L   CO2 25 22 - 32 mmol/L   Glucose, Bld 189 (H) 70 - 99 mg/dL    Comment: Glucose reference range applies only to samples taken after fasting for at least 8 hours.   BUN 14 8 - 23 mg/dL   Creatinine, Ser 9.17 0.44 - 1.00 mg/dL   Calcium  9.9 8.9 - 10.3 mg/dL   Total Protein 7.7 6.5 - 8.1 g/dL   Albumin  4.7 3.5 - 5.0 g/dL   AST 22 15 - 41 U/L   ALT 16 0 - 44 U/L   Alkaline Phosphatase 95 38 - 126 U/L   Total Bilirubin 0.8 0.0 - 1.2 mg/dL   GFR, Estimated >39 >39 mL/min    Comment: (NOTE) Calculated using the CKD-EPI Creatinine Equation (2021)    Anion gap 10 5 - 15    Comment: Performed at [facility], [address] [place], [place] [address]  CBC     Status: Abnormal   Collection Time: 06/02/23  6:27 AM  Result Value Ref Range   WBC 9.5 4.0 - 10.5 K/uL   RBC 5.23 (H) 3.87 - 5.11 MIL/uL   Hemoglobin 16.5 (H) 12.0 - 15.0 g/dL   HCT 49.2 (H)  63.9 - 46.0 %   MCV 96.9 80.0 - 100.0 fL   MCH 31.5 26.0 - 34.0 pg   MCHC 32.5 30.0 - 36.0 g/dL   RDW 86.7 88.4 - 84.4 %   Platelets 152 150 - 400 K/uL    Comment: SPECIMEN CHECKED FOR CLOTS REPEATED TO VERIFY    nRBC 0.0 0.0 - 0.2 %    Comment: Performed at [facility], [address], [place], [place] [address]  Urinalysis, Routine w reflex microscopic -Urine, Clean Catch     Status: Abnormal   Collection Time: 06/02/23  7:47 AM  Result Value Ref Range   Color, Urine AMBER (A) YELLOW    Comment: BIOCHEMICALS MAY BE AFFECTED BY COLOR   APPearance HAZY (A) CLEAR   Specific Gravity, Urine 1.024 1.005 - 1.030   pH 5.0 5.0 - 8.0   Glucose, UA NEGATIVE NEGATIVE mg/dL   Hgb urine dipstick SMALL (A) NEGATIVE   Bilirubin Urine NEGATIVE NEGATIVE   Ketones, ur 20 (A)  NEGATIVE mg/dL   Protein, ur 899 (A) NEGATIVE mg/dL   Nitrite NEGATIVE NEGATIVE   Leukocytes,Ua NEGATIVE NEGATIVE   RBC / HPF 11-20 0 - 5 RBC/hpf   WBC, UA 0-5 0 - 5 WBC/hpf   Bacteria, UA RARE (A) NONE SEEN   Squamous Epithelial / HPF 0-5 0 - 5 /HPF   Mucus PRESENT    Hyaline Casts, UA PRESENT     Comment: Performed at [facility], [address] [place], [place] [address]    CT ABDOMEN PELVIS W CONTRAST Result Date: 06/02/2023 CLINICAL DATA:  Acute abdominal pain beginning last night. Vomiting. EXAM: CT ABDOMEN AND PELVIS WITH CONTRAST TECHNIQUE: Multidetector CT imaging of the abdomen and pelvis was performed using the standard protocol following bolus administration of intravenous contrast. RADIATION DOSE REDUCTION: This exam was performed according to the departmental dose-optimization program which includes automated exposure control, adjustment of the mA and/or kV according to patient size and/or use of iterative reconstruction technique. CONTRAST:  OMNIPAQUE  IOHEXOL  300 MG/ML  SOLN COMPARISON:  05/08/2023 FINDINGS: Lower Chest: No acute findings.  Pectus excavatum again  noted. Hepatobiliary: No suspicious hepatic masses identified. Stable small right hepatic lobe cyst. Gallbladder is unremarkable. No evidence of biliary ductal dilatation. Pancreas:  No mass or inflammatory changes. Spleen: Within normal limits in size and appearance. Adrenals/Urinary Tract: No suspicious masses identified. No evidence of ureteral calculi or hydronephrosis. Stomach/Bowel: Surgical anastomosis again seen in rectosigmoid colon. Moderately dilated small bowel loops are again seen with air-fluid levels mildly increased since previous study. Transition point to nondilated small bowel loops seen in the right lower quadrant, consistent with distal small-bowel obstruction and likely due to adhesion. No mass, inflammatory process, abscess identified. Vascular/Lymphatic: No pathologically enlarged lymph nodes. No acute vascular findings. Reproductive: Poorly visualized due to severe artifact from bilateral hip prostheses. Other:  None. Musculoskeletal:  No suspicious bone lesions identified. IMPRESSION: Distal small-bowel obstruction, with transition point in right lower quadrant likely due to adhesion. No mass or inflammatory process identified. Electronically Signed   By: Norleen DELENA Kil M.D.   On: 06/02/2023 09:55    A/P: NEYLAN KOROMA is an 73 y.o. female with recurrent SBO  Prior exlap/[place] for diverticulitis 09/2012 (Dr. Kimble) Ostomy reversal, lysis of adhesions, enterorrhaphy, appendectomy 02/2013 (Dr. Kimble)  SBO 04/2023 Now recurrent SBO  - NPO; NG tube to low intermittent wall suction, SBO protocol has been ordered - Maintenance IV fluid as per primary - Home medications as per primary - We will follow with you  I spent a total of 65 minutes in both face-to-face and non-face-to-face activities, excluding procedures performed, for this visit on the date of this encounter.  Lonni Pizza, MD [facility], [facility]

## 2023-06-02 NOTE — ED Provider Notes (Signed)
 [place] EMERGENCY DEPARTMENT AT [facility] Provider Note   CSN: [id number] Arrival date & time: 06/02/23  9446     History  Chief Complaint  Patient presents with   Abdominal Pain   Emesis    Alyssa Robinson is a 73 y.o. female with history of hypertension, pancreatic cyst, hyperlipidemia, thrombocytopenia, glaucoma, diverticulosis, GERD, prior small bowel obstruction, who presents emergency department complaining of abdominal pain since 8 PM last night.  She states that she drank some cherry juice to help her sleep, and started having abdominal pain soon after.  She has been vomiting since midnight.  She describes her emesis is red in color.  She states that she was recently hospitalized for small bowel obstruction in January and required an NG tube.  Since being home she was able to advance to soft foods and has been doing well, up until last night.  She was able to have 2 bowel movements yesterday, but is unable to pass gas now.   Abdominal Pain Associated symptoms: nausea and vomiting   Emesis Associated symptoms: abdominal pain        Home Medications Prior to Admission medications   Medication Sig Start Date End Date Taking? Authorizing Provider  barrier cream (NON-SPECIFIED) CREA Apply 1 Application topically 2 (two) times daily as needed.    [provider]  fluconazole (DIFLUCAN) 150 MG tablet Take 150 mg by mouth daily.    [provider]  loratadine (CLARITIN) 10 MG tablet Take 10 mg by mouth daily.    [provider]  Pediatric Multiple Vit-C-FA (PEDIATRIC MULTIVITAMIN) chewable tablet Chew 1 tablet by mouth daily. [place]    [provider]  Probiotic Product (ULTRAFLORA IMMUNE HEALTH PO) Take 1 capsule by mouth daily.    [provider]  sodium chloride  (OCEAN) 0.65 % SOLN nasal spray Place 2-3 sprays into both nostrils 2 (two) times daily.    [provider]      Allergies    Caffeine, Fluad  quadrivalent [influenza vac a&b sa adj quad], and Sodium lauryl sulfate    Review of Systems   Review of Systems  Gastrointestinal:  Positive for abdominal pain, nausea and vomiting.       Obstipation  All other systems reviewed and are negative.   Physical Exam Updated Vital Signs BP (!) 150/71 (BP Location: Right Arm)   Pulse 82   Temp 98.4 F (36.9 C)   Resp 16   Wt 49.4 kg   SpO2 100%   BMI 21.97 kg/m  Physical Exam Vitals and nursing note reviewed.  Constitutional:      Appearance: Normal appearance.  HENT:     Head: Normocephalic and atraumatic.  Eyes:     Conjunctiva/sclera: Conjunctivae normal.  Cardiovascular:     Rate and Rhythm: Normal rate and regular rhythm.  Pulmonary:     Effort: Pulmonary effort is normal. No respiratory distress.     Breath sounds: Normal breath sounds.  Abdominal:     General: There is no distension.     Palpations: Abdomen is soft.     Tenderness: There is generalized abdominal tenderness.  Skin:    General: Skin is warm and dry.  Neurological:     General: No focal deficit present.     Mental Status: She is alert.     ED Results / Procedures / Treatments   Labs (all labs ordered are listed, but only abnormal results are displayed) Labs Reviewed  COMPREHENSIVE METABOLIC PANEL -  Abnormal; Notable for the following components:      Result Value   Glucose, Bld 189 (*)    All other components within normal limits  CBC - Abnormal; Notable for the following components:   RBC 5.23 (*)    Hemoglobin 16.5 (*)    HCT 50.7 (*)    All other components within normal limits  URINALYSIS, ROUTINE W REFLEX MICROSCOPIC - Abnormal; Notable for the following components:   Color, Urine AMBER (*)    APPearance HAZY (*)    Hgb urine dipstick SMALL (*)    Ketones, ur 20 (*)    Protein, ur 100 (*)    Bacteria, UA RARE (*)    All other components within normal limits  LIPASE, BLOOD    EKG None  Radiology CT ABDOMEN PELVIS W  CONTRAST Result Date: 06/02/2023 CLINICAL DATA:  Acute abdominal pain beginning last night. Vomiting. EXAM: CT ABDOMEN AND PELVIS WITH CONTRAST TECHNIQUE: Multidetector CT imaging of the abdomen and pelvis was performed using the standard protocol following bolus administration of intravenous contrast. RADIATION DOSE REDUCTION: This exam was performed according to the departmental dose-optimization program which includes automated exposure control, adjustment of the mA and/or kV according to patient size and/or use of iterative reconstruction technique. CONTRAST:  OMNIPAQUE  IOHEXOL  300 MG/ML  SOLN COMPARISON:  05/08/2023 FINDINGS: Lower Chest: No acute findings.  Pectus excavatum again noted. Hepatobiliary: No suspicious hepatic masses identified. Stable small right hepatic lobe cyst. Gallbladder is unremarkable. No evidence of biliary ductal dilatation. Pancreas:  No mass or inflammatory changes. Spleen: Within normal limits in size and appearance. Adrenals/Urinary Tract: No suspicious masses identified. No evidence of ureteral calculi or hydronephrosis. Stomach/Bowel: Surgical anastomosis again seen in rectosigmoid colon. Moderately dilated small bowel loops are again seen with air-fluid levels mildly increased since previous study. Transition point to nondilated small bowel loops seen in the right lower quadrant, consistent with distal small-bowel obstruction and likely due to adhesion. No mass, inflammatory process, abscess identified. Vascular/Lymphatic: No pathologically enlarged lymph nodes. No acute vascular findings. Reproductive: Poorly visualized due to severe artifact from bilateral hip prostheses. Other:  None. Musculoskeletal:  No suspicious bone lesions identified. IMPRESSION: Distal small-bowel obstruction, with transition point in right lower quadrant likely due to adhesion. No mass or inflammatory process identified. Electronically Signed   By: Norleen DELENA Kil M.D.   On: 06/02/2023 09:55     Procedures Procedures    Medications Ordered in ED Medications  acetaminophen  (OFIRMEV ) IV 1,000 mg (1,000 mg Intravenous New Bag/Given 06/02/23 1105)  sodium chloride  0.9 % bolus 1,000 mL (has no administration in time range)  diatrizoate  meglumine -sodium (GASTROGRAFIN ) 66-10 % solution 90 mL (has no administration in time range)  ondansetron  (ZOFRAN -ODT) disintegrating tablet 4 mg (4 mg Oral Given 06/02/23 0620)  iohexol  (OMNIPAQUE ) 300 MG/ML solution 100 mL (100 mLs Intravenous Contrast Given 06/02/23 0920)  ondansetron  (ZOFRAN ) injection 4 mg (4 mg Intravenous Given 06/02/23 1100)    ED Course/ Medical Decision Making/ A&P                                 Medical Decision Making Amount and/or Complexity of Data Reviewed Labs: ordered. Radiology: ordered.  Risk Prescription drug management.   This patient is a 73 y.o. female  who presents to the ED for concern of abdominal pain since last night.   Differential diagnoses prior to evaluation: The emergent differential diagnosis includes, but is  not limited to,  recurrent SBO, diverticulitis, gastroenteritis, kidney stone, pancreatitis, biliary colic. This is not an exhaustive differential.   Past Medical History / Co-morbidities / Social History: hypertension, pancreatic cyst, hyperlipidemia, thrombocytopenia, glaucoma, diverticulosis, GERD, prior small bowel obstruction; hx appendectomy in 2014 with lysis of adhesions and colostomy closure, abdominal hysterectomy  Additional history: Chart reviewed. Pertinent results include: Reviewed ED and admission records from January 2025 for SBO, did not require surgical intervention, resolved with NG tube.   Physical Exam: Physical exam performed. The pertinent findings include: Hypertensive, otherwise normal vitals. Generalized abdominal tenderness.   Lab Tests/Imaging studies: I personally interpreted labs/imaging and the pertinent results include: Hemoglobin elevated, likely  hemoconcentrated due to dehydration, CMP normal.  UA with small hemoglobin, negative for infection.  Normal lipase.  CT abdomen pelvis shows distal small bowel obstruction with transition point in right lower quadrant. I agree with the radiologist interpretation.  Medications: Zofran  ordered in triage.  I ordered medication including IV tylenol , zofran , IVF. Pt refused any stronger pain medication. I have reviewed the patients home medicines and have made adjustments as needed.  Consultations obtained: I consulted with general surgeon Dr Teresa who recommended: medical admission, NG tube, NPO, surgery will consult. He anticipates she will need surgical intervention but will evaluate patient and discuss options.   I consulted with hospitalist Dr Celinda who will admit.   Disposition: After consideration of the diagnostic results and the patients response to treatment, I feel that patient is requiring admission for management of small bowel obstruction. NG tube placed, pt to be NPO.   Final Clinical Impression(s) / ED Diagnoses Final diagnoses:  Small bowel obstruction (HCC)    Rx / DC Orders ED Discharge Orders     None      Portions of this report may have been transcribed using voice recognition software. Every effort was made to ensure accuracy; however, inadvertent computerized transcription errors may be present.    Dell Briner T, PA-C 06/02/23 1118    Dean Clarity, MD 06/02/23 1212

## 2023-06-02 NOTE — ED Notes (Signed)
 Patient is aware that she needs a urine sample. Patient asked to hold off on getting a sample due to the fact that she used the restroom right before her arrival.

## 2023-06-03 DIAGNOSIS — K56609 Unspecified intestinal obstruction, unspecified as to partial versus complete obstruction: Secondary | ICD-10-CM | POA: Diagnosis not present

## 2023-06-03 DIAGNOSIS — R739 Hyperglycemia, unspecified: Secondary | ICD-10-CM | POA: Diagnosis not present

## 2023-06-03 DIAGNOSIS — B3731 Acute candidiasis of vulva and vagina: Secondary | ICD-10-CM | POA: Diagnosis not present

## 2023-06-03 DIAGNOSIS — I10 Essential (primary) hypertension: Secondary | ICD-10-CM | POA: Diagnosis not present

## 2023-06-03 LAB — COMPREHENSIVE METABOLIC PANEL
ALT: 13 U/L (ref 0–44)
AST: 19 U/L (ref 15–41)
Albumin: 3.9 g/dL (ref 3.5–5.0)
Alkaline Phosphatase: 75 U/L (ref 38–126)
Anion gap: 9 (ref 5–15)
BUN: 16 mg/dL (ref 8–23)
CO2: 24 mmol/L (ref 22–32)
Calcium: 8.9 mg/dL (ref 8.9–10.3)
Chloride: 112 mmol/L — ABNORMAL HIGH (ref 98–111)
Creatinine, Ser: 0.65 mg/dL (ref 0.44–1.00)
GFR, Estimated: >60 mL/min (ref >60)
Glucose, Bld: 116 mg/dL — ABNORMAL HIGH (ref 70–99)
Potassium: 3.8 mmol/L (ref 3.5–5.1)
Sodium: 145 mmol/L (ref 135–145)
Total Bilirubin: 0.9 mg/dL (ref 0.0–1.2)
Total Protein: 6.3 g/dL — ABNORMAL LOW (ref 6.5–8.1)

## 2023-06-03 LAB — CBC
HCT: 43.6 % (ref 36.0–46.0)
Hemoglobin: 13.8 g/dL (ref 12.0–15.0)
MCH: 30.9 pg (ref 26.0–34.0)
MCHC: 31.7 g/dL (ref 30.0–36.0)
MCV: 97.8 fL (ref 80.0–100.0)
Platelets: 121 10*3/uL — ABNORMAL LOW (ref 150–400)
RBC: 4.46 MIL/uL (ref 3.87–5.11)
RDW: 13.7 % (ref 11.5–15.5)
WBC: 8.4 10*3/uL (ref 4.0–10.5)
nRBC: 0 % (ref 0.0–0.2)

## 2023-06-03 LAB — MAGNESIUM: Magnesium: 2.4 mg/dL (ref 1.7–2.4)

## 2023-06-03 MED ORDER — ZINC OXIDE 20 % EX OINT
TOPICAL_OINTMENT | Freq: Two times a day (BID) | CUTANEOUS | Status: DC
  Administered 2023-06-04: 1 via TOPICAL
  Filled 2023-06-03: qty 28.35

## 2023-06-03 MED ORDER — KCL-LACTATED RINGERS-D5W 20 MEQ/L IV SOLN
INTRAVENOUS | Status: AC
  Filled 2023-06-03 (×2): qty 1000

## 2023-06-03 MED ORDER — MORPHINE SULFATE (PF) 2 MG/ML IV SOLN: 1.0000 mg | INTRAVENOUS | Status: DC | PRN

## 2023-06-03 MED ORDER — MORPHINE SULFATE (PF) 2 MG/ML IV SOLN: 2.0000 mg | INTRAVENOUS | Status: DC | PRN

## 2023-06-03 MED ORDER — MICONAZOLE NITRATE 2 % EX CREA
TOPICAL_CREAM | Freq: Two times a day (BID) | CUTANEOUS | Status: DC
  Administered 2023-06-04: 1 via TOPICAL
  Filled 2023-06-03: qty 28

## 2023-06-03 NOTE — Plan of Care (Signed)

## 2023-06-03 NOTE — Plan of Care (Signed)
   Problem: Coping: Goal: Level of anxiety will decrease Outcome: Progressing   Problem: Pain Managment: Goal: General experience of comfort will improve and/or be controlled Outcome: Progressing   Problem: Safety: Goal: Ability to remain free from injury will improve Outcome: Progressing

## 2023-06-03 NOTE — Progress Notes (Signed)
 PROGRESS NOTE   Alyssa Robinson  FMW:[id number] DOB: 11-Apr-1951 DOA: 06/02/2023 PCP: Rollene Almarie LABOR, MD   Date of Service: the patient was seen and examined on 06/03/2023  Brief Narrative:   73 y.o. female with medical history significant of anemia,  GERD,  hypertension, history of small bowel obstruction and history of prior abdominal surgeries including prior exploratory laparotomy/Hartman's for diverticulitis in 2014 as well as an ostomy reversal, lysis of adhesions and appendectomy also in 2014 both performed by Dr. Kimble, presenting with nausea vomiting abdominal pain was a long hospital emergency department.  Upon evaluation in the emergency department CT imaging the abdomen pelvis revealed distal small bowel obstruction with transition point in the right lower quadrant.  The hospitalist was called to assess the patient for admission to the hospital.  Dr. Teresa with general surgery was consulted.  Patient was made NPO.  NG tube placed and patient was placed on intravenous fluids.    Assessment & Plan SBO (small bowel obstruction) [facility]) General Surgery consulted and following daily, their input is appreciated. N.p.o. NG tube continues to be in place to low intermittent suction Hydrating with intravenous isotonic fluids As needed opiate-based analgesics for associated pain Of note, patient was just recently hospitalized for small bowel obstruction in January.  General surgery is considering definitive surgical intervention during this hospitalization as a result.  Will await their recommendations. Hyperglycemia Will obtain hemoglobin A1c Essential hypertension, benign As needed intravenous antihypertensives for markedly elevated blood pressure. Anogenital candidiasis in female Recent diagnosis in the past several days perianal candidal infection.  Patient was placed on combination of oral fluconazole and miconazole  by her outpatient provider. Will continue topical miconazole   during this hospitalization and additionally add zinc  oxide    Subjective:  Patient complains of intermittent abdominal pain, mild to moderate intensity.  Patient is no longer experiencing any associated vomiting.  Physical Exam:  Vitals:   06/02/23 1625 06/02/23 2054 06/03/23 0110 06/03/23 0449  BP: (!) 147/66 (!) 146/76 (!) 145/70 (!) 140/75  Pulse: 84 84 71 64  Resp: 16 18 16 18   Temp: 98.3 F (36.8 C) 98.5 F (36.9 C) 98.4 F (36.9 C) 98.2 F (36.8 C)  TempSrc: Oral Oral Oral Oral  SpO2: 99% 99% 99% 99%  Weight: 45.4 kg     Height:        Constitutional: Awake alert and oriented x3, no associated distress.   Skin: no rashes, no lesions, good skin turgor noted. Eyes: Pupils are equally reactive to light.  No evidence of scleral icterus or conjunctival pallor.  ENMT: NG tube in place.  Bilious drainage noted.  Moist mucous membranes noted.  Posterior pharynx clear of any exudate or lesions.   Respiratory: clear to auscultation bilaterally, no wheezing, no crackles. Normal respiratory effort. No accessory muscle use.  Cardiovascular: Regular rate and rhythm, no murmurs / rubs / gallops. No extremity edema. 2+ pedal pulses. No carotid bruits.  Abdomen: Abdomen is soft with mild tenderness on exam.  Hypoactive bowel sounds.  No evidence of intra-abdominal masses.    Musculoskeletal: No joint deformity upper and lower extremities. Good ROM, no contractures. Normal muscle tone.    Data Reviewed:  I have personally reviewed and interpreted labs, imaging.  Significant findings are   CBC: Recent Labs  Lab 06/02/23 0627 06/03/23 0348  WBC 9.5 8.4  HGB 16.5* 13.8  HCT 50.7* 43.6  MCV 96.9 97.8  PLT 152 121*   Basic Metabolic Panel: Recent Labs  Lab  06/02/23 0627 06/03/23 0348  NA 137 145  K 4.5 3.8  CL 102 112*  CO2 25 24  GLUCOSE 189* 116*  BUN 14 16  CREATININE 0.82 0.65  CALCIUM  9.9 8.9  MG  --  2.4   GFR: Estimated Creatinine Clearance: 42.1 mL/min (by  C-G formula based on SCr of 0.65 mg/dL). Liver Function Tests: Recent Labs  Lab 06/02/23 0627 06/03/23 0348  AST 22 19  ALT 16 13  ALKPHOS 95 75  BILITOT 0.8 0.9  PROT 7.7 6.3*  ALBUMIN  4.7 3.9     Code Status:  Full code.  Code status decision has been confirmed with: patient Family Communication: Husband is at the bedside who has been updated on plan of care.   Severity of Illness:  The appropriate patient status for this patient is INPATIENT. Inpatient status is judged to be reasonable and necessary in order to provide the required intensity of service to ensure the patient's safety. The patient's presenting symptoms, physical exam findings, and initial radiographic and laboratory data in the context of their chronic comorbidities is felt to place them at high risk for further clinical deterioration. Furthermore, it is not anticipated that the patient will be medically stable for discharge from the hospital within 2 midnights of admission.   * I certify that at the point of admission it is my clinical judgment that the patient will require inpatient hospital care spanning beyond 2 midnights from the point of admission due to high intensity of service, high risk for further deterioration and high frequency of surveillance required.*  Time spent:  45 minutes  Author:  Zachary JINNY Ba MD  06/03/2023 7:49 AM

## 2023-06-03 NOTE — Progress Notes (Signed)
 Mobility Specialist - Progress Note   06/03/23 1129  Mobility  Activity Ambulated with assistance in hallway  Level of Assistance Independent  Assistive Device Other (Comment) (IV Pole)  Distance Ambulated (ft) 700 ft  Activity Response Tolerated well  Mobility Referral Yes  Mobility visit 1 Mobility  Mobility Specialist Start Time (ACUTE ONLY) 1113  Mobility Specialist Stop Time (ACUTE ONLY) 1128  Mobility Specialist Time Calculation (min) (ACUTE ONLY) 15 min   Pt received in bed and agreeable to mobility. No complaints during session. Pt to recliner after session with all needs met.    [facility]

## 2023-06-03 NOTE — Hospital Course (Addendum)
 Brief Narrative:  73 y.o. female with medical history significant of anemia,  GERD,  hypertension, history of small bowel obstruction and history of prior abdominal surgeries including prior exploratory laparotomy/Hartman's for diverticulitis in 2014 as well as an ostomy reversal, lysis of adhesions and appendectomy also in 2014 both performed by Dr. Kimble, presenting with nausea vomiting abdominal pain was a long hospital emergency department.  Upon evaluation in the emergency department CT imaging the abdomen pelvis revealed distal small bowel obstruction with transition point in the right lower quadrant.  The hospitalist was called to assess the patient for admission to the hospital. General surgery was consulted, patient was conservatively managed and slowly started improving.  Today she is doing significantly better and stable for discharge.  Assessment & Plan:  Principal Problem:   SBO (small bowel obstruction) (HCC) Active Problems:   Hyperglycemia   Essential hypertension, benign   Anogenital candidiasis in female    SBO (small bowel obstruction) (HCC) Ongoing conservative management with NG tube.  General surgery considering surgical intervention.  Hyperglycemia A1c 5.5  Essential hypertension, benign IV as needed  Anogenital candidiasis in female Recent diagnosis.  Was on fluconazole.  Continuing topical miconazole  and zinc  oxide  Hypokalemia/hypophosphatemia - As needed repletion  Thrombocytopenia - In the setting of acute illness.  Slowly improving today   DVT prophylaxis: SCDs Start: 06/02/23 1130    Code Status: Full Code Family Communication:   Status is: Inpatient Remains inpatient appropriate because: Hopefully discharge today    Subjective: Doing well relating.  Wishes to go home today.  Examination:  General exam: Appears calm and comfortable  Respiratory system: Clear to auscultation. Respiratory effort normal. Cardiovascular system: S1 & S2 heard,  RRR. No JVD, murmurs, rubs, gallops or clicks. No pedal edema. Gastrointestinal system: Abdomen is nondistended, soft and nontender. No organomegaly or masses felt. Normal bowel sounds heard. Central nervous system: Alert and oriented. No focal neurological deficits. Extremities: Symmetric 5 x 5 power. Skin: No rashes, lesions or ulcers Psychiatry: Judgement and insight appear normal. Mood & affect appropriate.

## 2023-06-03 NOTE — Progress Notes (Signed)
  Subjective No acute events. Passing flatus. No BM yet. Denies abdominal pain. Denies n/v with NG in place. Up walking halls this morning with Alyssa Robinson  Objective: Vital signs in last 24 hours: Temp:  [98.2 F (36.8 C)-98.5 F (36.9 C)] 98.2 F (36.8 C) (02/09 0449) Pulse Rate:  [64-86] 64 (02/09 0449) Resp:  [14-18] 18 (02/09 0449) BP: (140-150)/(66-76) 140/75 (02/09 0449) SpO2:  [99 %-100 %] 99 % (02/09 0449) Weight:  [45.4 kg-49.3 kg] 45.4 kg (02/08 1625) Last BM Date : 06/01/23  Intake/Output from previous day: 02/08 0701 - 02/09 0700 In: 2150 [I.V.:790; NG/GT:60; IV Piggyback:1300] Out: 800 [Emesis/NG output:800] Intake/Output this shift: No intake/output data recorded.  Gen: NAD, comfortable CV: RRR Pulm: Normal work of breathing Abd: Soft, nontender, mildly distended Ext: SCDs in place  Lab Results: CBC  Recent Labs    06/02/23 0627 06/03/23 0348  WBC 9.5 8.4  HGB 16.5* 13.8  HCT 50.7* 43.6  PLT 152 121*   BMET Recent Labs    06/02/23 0627 06/03/23 0348  NA 137 145  K 4.5 3.8  CL 102 112*  CO2 25 24  GLUCOSE 189* 116*  BUN 14 16  CREATININE 0.82 0.65  CALCIUM  9.9 8.9   PT/INR No results for input(s): LABPROT, INR in the last 72 hours. ABG No results for input(s): PHART, HCO3 in the last 72 hours.  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   History of colonic polyps 02/03/2022   Family history of early CAD 07/02/2020   Lipoma of abdominal wall 08/04/2019   Osteopenia 01/28/2019   Allergic rhinitis 01/28/2019   Thrombocytopenia (HCC) 10/17/2017   Hyperlipidemia with target LDL less than 130 09/14/2015   Vaginal atrophy 06/01/2015   Cyst of pancreas 03/06/2015    Erythrocytosis 05/13/2014   Glaucoma suspect of both eyes 03/11/2014   Nuclear sclerosis of both eyes 03/11/2014   Routine general medical examination at a health care facility 12/11/2012   Hyperglycemia 12/11/2012   Essential hypertension, benign 12/11/2012   Alyssa Robinson is an 73 y.o. female with recurrent SBO   Prior exlap/Hartmanns for diverticulitis 09/2012 (Dr. Kimble) Ostomy reversal, lysis of adhesions, enterorrhaphy, appendectomy 02/2013 (Dr. Kimble)   SBO 04/2023 Now recurrent SBO  - SBO protocol-8-hour delay showed contrast in stomach and proximal small bowel; repeat abdominal x-ray later today - NPO, NG tube to LIWS - MIVF - Given short interval recurrence and CT showing potentially similar location for Alyssa obstruction being in the right central abdomen, may be reasonable to consider surgery this admission.  We did discuss that this would largely be at the discretion of my partner who will be seeing Alyssa this week, Dr. Lyndel, for further discussions and decision making - We will continue to follow closely with you   LOS: 1 day   I spent a total of 35 minutes in both face-to-face and non-face-to-face activities, excluding procedures performed, for this visit on the date of this encounter.  Lonni Pizza, MD [facility]

## 2023-06-04 ENCOUNTER — Inpatient Hospital Stay (HOSPITAL_COMMUNITY): Payer: Medicare Other

## 2023-06-04 DIAGNOSIS — K56609 Unspecified intestinal obstruction, unspecified as to partial versus complete obstruction: Secondary | ICD-10-CM | POA: Diagnosis not present

## 2023-06-04 DIAGNOSIS — B3731 Acute candidiasis of vulva and vagina: Secondary | ICD-10-CM | POA: Insufficient documentation

## 2023-06-04 LAB — CBC WITH DIFFERENTIAL/PLATELET
Abs Immature Granulocytes: 0.01 10*3/uL (ref 0.00–0.07)
Basophils Absolute: 0 10*3/uL (ref 0.0–0.1)
Basophils Relative: 0 %
Eosinophils Absolute: 0.1 10*3/uL (ref 0.0–0.5)
Eosinophils Relative: 1 %
HCT: 40.2 % (ref 36.0–46.0)
Hemoglobin: 12.7 g/dL (ref 12.0–15.0)
Immature Granulocytes: 0 %
Lymphocytes Relative: 25 %
Lymphs Abs: 1.2 10*3/uL (ref 0.7–4.0)
MCH: 31.3 pg (ref 26.0–34.0)
MCHC: 31.6 g/dL (ref 30.0–36.0)
MCV: 99 fL (ref 80.0–100.0)
Monocytes Absolute: 1.1 10*3/uL — ABNORMAL HIGH (ref 0.1–1.0)
Monocytes Relative: 22 %
Neutro Abs: 2.6 10*3/uL (ref 1.7–7.7)
Neutrophils Relative %: 52 %
Platelets: 103 10*3/uL — ABNORMAL LOW (ref 150–400)
RBC: 4.06 MIL/uL (ref 3.87–5.11)
RDW: 13.7 % (ref 11.5–15.5)
WBC: 5 10*3/uL (ref 4.0–10.5)
nRBC: 0 % (ref 0.0–0.2)

## 2023-06-04 LAB — COMPREHENSIVE METABOLIC PANEL
ALT: 13 U/L (ref 0–44)
AST: 19 U/L (ref 15–41)
Albumin: 3.4 g/dL — ABNORMAL LOW (ref 3.5–5.0)
Alkaline Phosphatase: 59 U/L (ref 38–126)
Anion gap: 6 (ref 5–15)
BUN: 13 mg/dL (ref 8–23)
CO2: 24 mmol/L (ref 22–32)
Calcium: 8.7 mg/dL — ABNORMAL LOW (ref 8.9–10.3)
Chloride: 113 mmol/L — ABNORMAL HIGH (ref 98–111)
Creatinine, Ser: 0.5 mg/dL (ref 0.44–1.00)
GFR, Estimated: >60 mL/min (ref >60)
Glucose, Bld: 118 mg/dL — ABNORMAL HIGH (ref 70–99)
Potassium: 3.7 mmol/L (ref 3.5–5.1)
Sodium: 143 mmol/L (ref 135–145)
Total Bilirubin: 0.7 mg/dL (ref 0.0–1.2)
Total Protein: 5.9 g/dL — ABNORMAL LOW (ref 6.5–8.1)

## 2023-06-04 LAB — MAGNESIUM: Magnesium: 2.5 mg/dL — ABNORMAL HIGH (ref 1.7–2.4)

## 2023-06-04 LAB — HEMOGLOBIN A1C
Hgb A1c MFr Bld: 5.5 % (ref 4.8–5.6)
Mean Plasma Glucose: 111.15 mg/dL

## 2023-06-04 MED ORDER — IPRATROPIUM-ALBUTEROL 0.5-2.5 (3) MG/3ML IN SOLN: 3.0000 mL | RESPIRATORY_TRACT | Status: DC | PRN

## 2023-06-04 MED ORDER — SENNOSIDES-DOCUSATE SODIUM 8.6-50 MG PO TABS: 1.0000 | ORAL_TABLET | Freq: Every evening | ORAL | Status: DC | PRN

## 2023-06-04 MED ORDER — METOPROLOL TARTRATE 5 MG/5ML IV SOLN: 5.0000 mg | INTRAVENOUS | Status: DC | PRN

## 2023-06-04 MED ORDER — GUAIFENESIN 100 MG/5ML PO LIQD: 5.0000 mL | ORAL | Status: DC | PRN

## 2023-06-04 MED ORDER — TRAZODONE HCL 50 MG PO TABS: 50.0000 mg | ORAL_TABLET | Freq: Every evening | ORAL | Status: DC | PRN

## 2023-06-04 MED ORDER — HYDRALAZINE HCL 20 MG/ML IJ SOLN: 10.0000 mg | Freq: Four times a day (QID) | INTRAMUSCULAR | Status: DC | PRN

## 2023-06-04 MED ORDER — DEXTROSE-SODIUM CHLORIDE 5-0.45 % IV SOLN: INTRAVENOUS | Status: AC

## 2023-06-04 NOTE — Assessment & Plan Note (Signed)
 As needed intravenous antihypertensives for markedly elevated blood pressure.

## 2023-06-04 NOTE — Plan of Care (Signed)
 ?  Problem: Clinical Measurements: ?Goal: Ability to maintain clinical measurements within normal limits will improve ?Outcome: Progressing ?Goal: Will remain free from infection ?Outcome: Progressing ?Goal: Diagnostic test results will improve ?Outcome: Progressing ?  ?

## 2023-06-04 NOTE — TOC CM/SW Note (Signed)
 Transition of Care [facility]) - Inpatient Brief Assessment   Patient Details  Name: Alyssa Robinson MRN: [id number] Date of Birth: 1950-08-12  Transition of [facility] CM/SW Contact:    Bari Leys, RN Phone Number: 06/04/2023, 11:27 AM   Clinical Narrative: Met with pt at bedside to introduce role of TOC/NCM and review for dc planning, Pt reports she has an established PCP and pharmacy, no current home care services or home DME,  reports she resides with her spouse and feels safe returning home. TOC will continue to follow.     Transition of Care Asessment: Insurance and Status: Insurance coverage has been reviewed Patient has primary care physician: Yes Home environment has been reviewed: resides in private residence with spouse Prior level of function:: Independent Prior/Current Home Services: No current home services Social Drivers of Health Review: SDOH reviewed no interventions necessary Readmission risk has been reviewed: Yes Transition of care needs: no transition of care needs at this time

## 2023-06-04 NOTE — Progress Notes (Signed)
 PROGRESS NOTE    Alyssa Robinson  QMV:[id number] DOB: 05-27-1950 DOA: 06/02/2023 PCP: Adelia Homestead, MD    Brief Narrative:  73 y.o. female with medical history significant of anemia,  GERD,  hypertension, history of small bowel obstruction and history of prior abdominal surgeries including prior exploratory laparotomy/Hartman's for diverticulitis in 2014 as well as an ostomy reversal, lysis of adhesions and appendectomy also in 2014 both performed by Dr. Mammie Sears, presenting with nausea vomiting abdominal pain was a long hospital emergency department.   Upon evaluation in the emergency department CT imaging the abdomen pelvis revealed distal small bowel obstruction with transition point in the right lower quadrant.  The hospitalist was called to assess the patient for admission to the hospital.   Dr. Camilo Cella with general surgery was consulted.  Patient was made NPO.  NG tube placed and patient was placed on intravenous fluids.   Assessment & Plan:  Principal Problem:   SBO (small bowel obstruction) (HCC) Active Problems:   Hyperglycemia   Essential hypertension, benign   Anogenital candidiasis in female    SBO (small bowel obstruction) (HCC) Ongoing conservative management with NG tube.  General surgery considering surgical intervention.  Hyperglycemia A1c 5.5  Essential hypertension, benign IV as needed  Anogenital candidiasis in female Recent diagnosis.  Was on fluconazole.  Continuing topical miconazole  and zinc  oxide   DVT prophylaxis: SCDs Start: 06/02/23 1130    Code Status: Full Code Family Communication:   Status is: Inpatient Remains inpatient appropriate because: Hopefully discharge in 24 hours    Subjective: Seen at bedside no complaints.  Tells me she is passing gas.   Examination:  General exam: Appears calm and comfortable  Respiratory system: Clear to auscultation. Respiratory effort normal. Cardiovascular system: S1 & S2 heard, RRR. No JVD,  murmurs, rubs, gallops or clicks. No pedal edema. Gastrointestinal system: Abdomen is nondistended, soft and nontender. No organomegaly or masses felt. Normal bowel sounds heard. Central nervous system: Alert and oriented. No focal neurological deficits. Extremities: Symmetric 5 x 5 power. Skin: No rashes, lesions or ulcers Psychiatry: Judgement and insight appear normal. Mood & affect appropriate.                Diet Orders (From admission, onward)     Start     Ordered   06/04/23 1046  Diet NPO time specified Except for: Ice Chips, Other (See Comments)  Diet effective now       Comments: Okay for clears from the floor. NPO midnight.  Question Answer Comment  Except for Ice Chips   Except for Other (See Comments)      06/04/23 1046            Objective: Vitals:   06/03/23 1327 06/03/23 2014 06/04/23 0502 06/04/23 1305  BP: (!) 150/69 (!) 141/68 (!) 149/72 (!) 152/69  Pulse: 66 68 (!) 59 (!) 57  Resp: 16 18 18 16   Temp: 98.1 F (36.7 C) 97.7 F (36.5 C) 98.2 F (36.8 C) 97.9 F (36.6 C)  TempSrc:  Oral Oral   SpO2: 100% 99% 99% 99%  Weight:      Height:        Intake/Output Summary (Last 24 hours) at 06/04/2023 1315 Last data filed at 06/04/2023 0859 Gross per 24 hour  Intake 1525.1 ml  Output 650 ml  Net 875.1 ml   Filed Weights   06/02/23 0616 06/02/23 1624 06/02/23 1625  Weight: 49.4 kg 49.3 kg 45.4 kg    Scheduled  Meds:  miconazole    Topical BID   zinc  oxide   Topical BID   Continuous Infusions:  dextrose  5 % and 0.45 % NaCl 75 mL/hr at 06/04/23 0916    Nutritional status     Body mass index is 20.22 kg/m.  Data Reviewed:   CBC: Recent Labs  Lab 06/02/23 0627 06/03/23 0348 06/04/23 0438  WBC 9.5 8.4 5.0  NEUTROABS  --   --  2.6  HGB 16.5* 13.8 12.7  HCT 50.7* 43.6 40.2  MCV 96.9 97.8 99.0  PLT 152 121* 103*   Basic Metabolic Panel: Recent Labs  Lab 06/02/23 0627 06/03/23 0348 06/04/23 0438  NA 137 145 143  K 4.5  3.8 3.7  CL 102 112* 113*  CO2 25 24 24   GLUCOSE 189* 116* 118*  BUN 14 16 13   CREATININE 0.82 0.65 0.50  CALCIUM  9.9 8.9 8.7*  MG  --  2.4 2.5*   GFR: Estimated Creatinine Clearance: 42.1 mL/min (by C-G formula based on SCr of 0.5 mg/dL). Liver Function Tests: Recent Labs  Lab 06/02/23 0627 06/03/23 0348 06/04/23 0438  AST 22 19 19   ALT 16 13 13   ALKPHOS 95 75 59  BILITOT 0.8 0.9 0.7  PROT 7.7 6.3* 5.9*  ALBUMIN  4.7 3.9 3.4*   Recent Labs  Lab 06/02/23 0627  LIPASE 25   No results for input(s): "AMMONIA" in the last 168 hours. Coagulation Profile: No results for input(s): "INR", "PROTIME" in the last 168 hours. Cardiac Enzymes: No results for input(s): "CKTOTAL", "CKMB", "CKMBINDEX", "TROPONINI" in the last 168 hours. BNP (last 3 results) No results for input(s): "PROBNP" in the last 8760 hours. HbA1C: Recent Labs    06/04/23 0438  HGBA1C 5.5   CBG: No results for input(s): "GLUCAP" in the last 168 hours. Lipid Profile: No results for input(s): "CHOL", "HDL", "LDLCALC", "TRIG", "CHOLHDL", "LDLDIRECT" in the last 72 hours. Thyroid  Function Tests: No results for input(s): "TSH", "T4TOTAL", "FREET4", "T3FREE", "THYROIDAB" in the last 72 hours. Anemia Panel: No results for input(s): "VITAMINB12", "FOLATE", "FERRITIN", "TIBC", "IRON", "RETICCTPCT" in the last 72 hours. Sepsis Labs: No results for input(s): "PROCALCITON", "LATICACIDVEN" in the last 168 hours.  No results found for this or any previous visit (from the past 240 hours).       Radiology Studies: DG Abd Portable 1V-Small Bowel Obstruction Protocol-initial, 8 hr delay Result Date: 06/02/2023 CLINICAL DATA:  Small bowel obstruction, 8 hour delay EXAM: PORTABLE ABDOMEN - 1 VIEW COMPARISON:  CT earlier today. FINDINGS: NG tube is in the stomach. Contrast material predominantly within the fundus of the stomach. A small amount of contrast noted within the proximal small bowel loops. Continued small bowel  dilatation. Gas seen within the colon. No organomegaly or free air. IMPRESSION: Most of the contrast is in the stomach with a small amount contrast in the proximal small bowel. Continued small bowel obstruction pattern. Electronically Signed   By: Janeece Mechanic M.D.   On: 06/02/2023 22:09           LOS: 2 days   Time spent= 35 mins    Maggie Schooner, MD [facility]  If 7PM-7AM, please contact night-coverage  06/04/2023, 1:15 PM

## 2023-06-04 NOTE — Assessment & Plan Note (Signed)
 General Surgery consulted and following daily, their input is appreciated. N.p.o. NG tube continues to be in place to low intermittent suction Hydrating with intravenous isotonic fluids As needed opiate-based analgesics for associated pain Of note, patient was just recently hospitalized for small bowel obstruction in January.  General surgery is considering definitive surgical intervention during this hospitalization as a result.  Will await their recommendations.

## 2023-06-04 NOTE — Plan of Care (Signed)

## 2023-06-04 NOTE — Assessment & Plan Note (Signed)
Will obtain hemoglobin A1c 

## 2023-06-04 NOTE — Progress Notes (Signed)
 Subjective: CC: Feeling better. No abdominal pain at rest. Feels less bloated. No nausea. NGT w/ 650cc/24 hours. Passing flatus. No BM. Xray this am pending - on my read there is contrast in the colon.   Objective: Vital signs in last 24 hours: Temp:  [97.7 F (36.5 C)-98.2 F (36.8 C)] 98.2 F (36.8 C) (02/10 0502) Pulse Rate:  [59-68] 59 (02/10 0502) Resp:  [16-18] 18 (02/10 0502) BP: (141-150)/(68-72) 149/72 (02/10 0502) SpO2:  [99 %-100 %] 99 % (02/10 0502) Last BM Date : 06/01/23  Intake/Output from previous day: 02/09 0701 - 02/10 0700 In: 1585.1 [I.V.:1405.1; NG/GT:180] Out: 650 [Emesis/NG output:650] Intake/Output this shift: Total I/O In: 0  Out: 100 [Emesis/NG output:100]  PE: Gen:  Alert, NAD, pleasant Abd: Soft, mild distension, mild lower abdominal ttp without rigidity or guarding, +BS. NGT w/ thin output. Prior midline, pfannenstiel and ostomy scar well healed.   Lab Results:  Recent Labs    06/03/23 0348 06/04/23 0438  WBC 8.4 5.0  HGB 13.8 12.7  HCT 43.6 40.2  PLT 121* 103*   BMET Recent Labs    06/03/23 0348 06/04/23 0438  NA 145 143  K 3.8 3.7  CL 112* 113*  CO2 24 24  GLUCOSE 116* 118*  BUN 16 13  CREATININE 0.65 0.50  CALCIUM  8.9 8.7*   PT/INR No results for input(s): "LABPROT", "INR" in the last 72 hours. CMP     Component Value Date/Time   NA 143 06/04/2023 0438   K 3.7 06/04/2023 0438   CL 113 (H) 06/04/2023 0438   CO2 24 06/04/2023 0438   GLUCOSE 118 (H) 06/04/2023 0438   BUN 13 06/04/2023 0438   CREATININE 0.50 06/04/2023 0438   CALCIUM  8.7 (L) 06/04/2023 0438   PROT 5.9 (L) 06/04/2023 0438   ALBUMIN  3.4 (L) 06/04/2023 0438   AST 19 06/04/2023 0438   ALT 13 06/04/2023 0438   ALKPHOS 59 06/04/2023 0438   BILITOT 0.7 06/04/2023 0438   GFRNONAA >60 06/04/2023 0438   GFRAA >90 03/22/2013 0410   Lipase     Component Value Date/Time   LIPASE 25 06/02/2023 0627    Studies/Results: DG Abd Portable 1V-Small  Bowel Obstruction Protocol-initial, 8 hr delay Result Date: 06/02/2023 CLINICAL DATA:  Small bowel obstruction, 8 hour delay EXAM: PORTABLE ABDOMEN - 1 VIEW COMPARISON:  CT earlier today. FINDINGS: NG tube is in the stomach. Contrast material predominantly within the fundus of the stomach. A small amount of contrast noted within the proximal small bowel loops. Continued small bowel dilatation. Gas seen within the colon. No organomegaly or free air. IMPRESSION: Most of the contrast is in the stomach with a small amount contrast in the proximal small bowel. Continued small bowel obstruction pattern. Electronically Signed   By: Janeece Mechanic M.D.   On: 06/02/2023 22:09   DG Abd Portable 1V-Small Bowel Protocol-Position Verification Result Date: 06/02/2023 CLINICAL DATA:  [id number] Encounter for imaging study to confirm nasogastric (NG) tube placement [id number] EXAM: PORTABLE ABDOMEN - 1 VIEW COMPARISON:  CT abdomen/pelvis from earlier today FINDINGS: Enteric tube terminates in the mid body of the stomach. Clear lung bases. Excreted contrast noted in the nondilated renal collecting systems. Moderate colonic stool and gas. No dilated small bowel loops. No evidence of pneumatosis or pneumoperitoneum. IMPRESSION: Enteric tube terminates in the mid body of the stomach. Electronically Signed   By: Levell Reach M.D.   On: 06/02/2023 12:01    Anti-infectives: Anti-infectives (From  admission, onward)    None        Assessment/Plan Recurrent SBO  - She has a hx of prior hernia repair x 2 (as a child); abdominal hysterectomy; exlap/Hartmanns for diverticulitis 09/2012 (Dr. Mammie Sears); and Ostomy reversal, lysis of adhesions, enterorrhaphy, appendectomy 02/2013 (Dr. Mammie Sears) - HDS without fever, tachycardia or hypotension. No peritonitis on exam. WBC wnl. No current indication for emergency surgery - Keep K > 4, Mg > 2 and mobilize as able for bowel function - Given short interval recurrence and CT showing potentially  similar location for her obstruction being in the right central abdomen offered her surgery during admission.  The planned procedure and material risks were discussed with the patient. We also discussed typical post-operative care. She is unsure if she would like to have surgery during admission if she was to improve.  - As she is now passing flatus and has contrast in colon on my read of pending xray will clamp NGT and allow CLD. Return to [facility] and make NPO if patient develops increased abdominal pain, distension, n/v.  - Make NPO and return NGT to [facility] at midnight. Will follow up in AM to see if patient would like to proceed w/ surgery or continue with conservative management if she was to continue to improve. If patient was to fail to improve w/ conservative management would recommend OR.   FEN - Clamp NGT, Clears from the floor, IVF per primary  VTE - SCDs, okay for chem ppx from a general surgery standpoint ID - None  I reviewed nursing notes, last 24 h vitals and pain scores, last 48 h intake and output, last 24 h labs and trends, and last 24 h imaging results.   LOS: 2 days    Delton Filbert , [facility] 06/04/2023, 10:45 AM Please see Amion for pager number during day hours 7:00am-4:30pm

## 2023-06-04 NOTE — Assessment & Plan Note (Signed)
 Recent diagnosis in the past several days perianal candidal infection.  Patient was placed on combination of oral fluconazole and miconazole  by her outpatient provider. Will continue topical miconazole  during this hospitalization and additionally add zinc  oxide

## 2023-06-05 DIAGNOSIS — E43 Unspecified severe protein-calorie malnutrition: Secondary | ICD-10-CM | POA: Insufficient documentation

## 2023-06-05 DIAGNOSIS — E441 Mild protein-calorie malnutrition: Secondary | ICD-10-CM | POA: Insufficient documentation

## 2023-06-05 DIAGNOSIS — K56609 Unspecified intestinal obstruction, unspecified as to partial versus complete obstruction: Secondary | ICD-10-CM | POA: Diagnosis not present

## 2023-06-05 LAB — MAGNESIUM: Magnesium: 2.1 mg/dL (ref 1.7–2.4)

## 2023-06-05 LAB — CBC
HCT: 39.6 % (ref 36.0–46.0)
Hemoglobin: 12.5 g/dL (ref 12.0–15.0)
MCH: 30.9 pg (ref 26.0–34.0)
MCHC: 31.6 g/dL (ref 30.0–36.0)
MCV: 98 fL (ref 80.0–100.0)
Platelets: 80 10*3/uL — ABNORMAL LOW (ref 150–400)
RBC: 4.04 MIL/uL (ref 3.87–5.11)
RDW: 13.4 % (ref 11.5–15.5)
WBC: 5.2 10*3/uL (ref 4.0–10.5)
nRBC: 0 % (ref 0.0–0.2)

## 2023-06-05 LAB — BASIC METABOLIC PANEL
Anion gap: 7 (ref 5–15)
BUN: 8 mg/dL (ref 8–23)
CO2: 22 mmol/L (ref 22–32)
Calcium: 8.2 mg/dL — ABNORMAL LOW (ref 8.9–10.3)
Chloride: 109 mmol/L (ref 98–111)
Creatinine, Ser: 0.42 mg/dL — ABNORMAL LOW (ref 0.44–1.00)
GFR, Estimated: >60 mL/min (ref >60)
Glucose, Bld: 97 mg/dL (ref 70–99)
Potassium: 3 mmol/L — ABNORMAL LOW (ref 3.5–5.1)
Sodium: 138 mmol/L (ref 135–145)

## 2023-06-05 LAB — PHOSPHORUS: Phosphorus: 2.1 mg/dL — ABNORMAL LOW (ref 2.5–4.6)

## 2023-06-05 MED ORDER — POTASSIUM CHLORIDE 10 MEQ/100ML IV SOLN
10.0000 meq | INTRAVENOUS | Status: AC
  Administered 2023-06-05 (×4): 10 meq via INTRAVENOUS
  Filled 2023-06-05 (×4): qty 100

## 2023-06-05 MED ORDER — ENSURE ENLIVE PO LIQD
237.0000 mL | Freq: Two times a day (BID) | ORAL | Status: DC
  Administered 2023-06-05: 237 mL via ORAL

## 2023-06-05 MED ORDER — POTASSIUM CHLORIDE 10 MEQ/100ML IV SOLN
10.0000 meq | INTRAVENOUS | Status: DC
  Administered 2023-06-05 (×2): 10 meq via INTRAVENOUS
  Filled 2023-06-05 (×2): qty 100

## 2023-06-05 MED ORDER — DEXTROSE 5 % IV SOLN: 30.0000 mmol | Freq: Once | INTRAVENOUS | Status: DC

## 2023-06-05 MED ORDER — POTASSIUM PHOSPHATES 15 MMOLE/5ML IV SOLN
30.0000 mmol | Freq: Once | INTRAVENOUS | Status: AC
  Administered 2023-06-05: 30 mmol via INTRAVENOUS
  Filled 2023-06-05: qty 10

## 2023-06-05 NOTE — Progress Notes (Signed)
PROGRESS NOTE    Alyssa Robinson  UJW:[id number] DOB: 12-24-1950 DOA: 06/02/2023 PCP: Myrlene Broker, MD    Brief Narrative:  73 y.o. female with medical history significant of anemia,  GERD,  hypertension, history of small bowel obstruction and history of prior abdominal surgeries including prior exploratory laparotomy/Hartman's for diverticulitis in 2014 as well as an ostomy reversal, lysis of adhesions and appendectomy also in 2014 both performed by Dr. Lindie Spruce, presenting with nausea vomiting abdominal pain was a long hospital emergency department.   Upon evaluation in the emergency department CT imaging the abdomen pelvis revealed distal small bowel obstruction with transition point in the right lower quadrant.  The hospitalist was called to assess the patient for admission to the hospital.   Dr. Cliffton Asters with general surgery was consulted.  Patient was made NPO.  NG tube placed and patient was placed on intravenous fluids.   Assessment & Plan:  Principal Problem:   SBO (small bowel obstruction) (HCC) Active Problems:   Hyperglycemia   Essential hypertension, benign   Anogenital candidiasis in female    SBO (small bowel obstruction) (HCC) Ongoing conservative management with NG tube.  General surgery considering surgical intervention.  Hyperglycemia A1c 5.5  Essential hypertension, benign IV as needed  Anogenital candidiasis in female Recent diagnosis.  Was on fluconazole.  Continuing topical miconazole and zinc oxide  Hypokalemia/hypophosphatemia - As needed repletion   DVT prophylaxis: SCDs Start: 06/02/23 1130    Code Status: Full Code Family Communication:   Status is: Inpatient Remains inpatient appropriate because: Hopefully discharge in 24 hours    Subjective: Seen at bedside, ambulating better.  Has some dietary questions and would like to speak with a dietitian.  Examination:  General exam: Appears calm and comfortable  Respiratory system: Clear to  auscultation. Respiratory effort normal. Cardiovascular system: S1 & S2 heard, RRR. No JVD, murmurs, rubs, gallops or clicks. No pedal edema. Gastrointestinal system: Abdomen is nondistended, soft and nontender. No organomegaly or masses felt. Normal bowel sounds heard. Central nervous system: Alert and oriented. No focal neurological deficits. Extremities: Symmetric 5 x 5 power. Skin: No rashes, lesions or ulcers Psychiatry: Judgement and insight appear normal. Mood & affect appropriate.                Diet Orders (From admission, onward)     Start     Ordered   06/05/23 1235  DIET SOFT Room service appropriate? Yes; Fluid consistency: Thin  Diet effective now       Question Answer Comment  Room service appropriate? Yes   Fluid consistency: Thin      06/05/23 1234            Objective: Vitals:   06/04/23 0502 06/04/23 1305 06/04/23 2158 06/05/23 0634  BP: (!) 149/72 (!) 152/69 125/65 126/74  Pulse: (!) 59 (!) 57 66 (!) 59  Resp: 18 16 18 18   Temp: 98.2 F (36.8 C) 97.9 F (36.6 C) 97.9 F (36.6 C) (!) 97.4 F (36.3 C)  TempSrc: Oral  Oral Oral  SpO2: 99% 99% 98% 100%  Weight:      Height:        Intake/Output Summary (Last 24 hours) at 06/05/2023 1242 Last data filed at 06/05/2023 1019 Gross per 24 hour  Intake 1997.04 ml  Output 0 ml  Net 1997.04 ml   Filed Weights   06/02/23 0616 06/02/23 1624 06/02/23 1625  Weight: 49.4 kg 49.3 kg 45.4 kg    Scheduled Meds:  feeding  supplement  237 mL Oral BID BM   miconazole   Topical BID   zinc oxide   Topical BID   Continuous Infusions:  potassium chloride 10 mEq (06/05/23 1135)   potassium PHOSPHATE 30 mmol in dextrose 5 % 250 mL infusion      Nutritional status     Body mass index is 20.22 kg/m.  Data Reviewed:   CBC: Recent Labs  Lab 06/02/23 0627 06/03/23 0348 06/04/23 0438 06/05/23 0328  WBC 9.5 8.4 5.0 5.2  NEUTROABS  --   --  2.6  --   HGB 16.5* 13.8 12.7 12.5  HCT 50.7* 43.6  40.2 39.6  MCV 96.9 97.8 99.0 98.0  PLT 152 121* 103* 80*   Basic Metabolic Panel: Recent Labs  Lab 06/02/23 0627 06/03/23 0348 06/04/23 0438 06/05/23 0328  NA 137 145 143 138  K 4.5 3.8 3.7 3.0*  CL 102 112* 113* 109  CO2 25 24 24 22   GLUCOSE 189* 116* 118* 97  BUN 14 16 13 8   CREATININE 0.82 0.65 0.50 0.42*  CALCIUM 9.9 8.9 8.7* 8.2*  MG  --  2.4 2.5* 2.1  PHOS  --   --   --  2.1*   GFR: Estimated Creatinine Clearance: 42.1 mL/min (A) (by C-G formula based on SCr of 0.42 mg/dL (L)). Liver Function Tests: Recent Labs  Lab 06/02/23 0627 06/03/23 0348 06/04/23 0438  AST 22 19 19   ALT 16 13 13   ALKPHOS 95 75 59  BILITOT 0.8 0.9 0.7  PROT 7.7 6.3* 5.9*  ALBUMIN 4.7 3.9 3.4*   Recent Labs  Lab 06/02/23 0627  LIPASE 25   No results for input(s): "AMMONIA" in the last 168 hours. Coagulation Profile: No results for input(s): "INR", "PROTIME" in the last 168 hours. Cardiac Enzymes: No results for input(s): "CKTOTAL", "CKMB", "CKMBINDEX", "TROPONINI" in the last 168 hours. BNP (last 3 results) No results for input(s): "PROBNP" in the last 8760 hours. HbA1C: Recent Labs    06/04/23 0438  HGBA1C 5.5   CBG: No results for input(s): "GLUCAP" in the last 168 hours. Lipid Profile: No results for input(s): "CHOL", "HDL", "LDLCALC", "TRIG", "CHOLHDL", "LDLDIRECT" in the last 72 hours. Thyroid Function Tests: No results for input(s): "TSH", "T4TOTAL", "FREET4", "T3FREE", "THYROIDAB" in the last 72 hours. Anemia Panel: No results for input(s): "VITAMINB12", "FOLATE", "FERRITIN", "TIBC", "IRON", "RETICCTPCT" in the last 72 hours. Sepsis Labs: No results for input(s): "PROCALCITON", "LATICACIDVEN" in the last 168 hours.  No results found for this or any previous visit (from the past 240 hours).       Radiology Studies: DG Abd Portable 1V Result Date: 06/04/2023 CLINICAL DATA:  Small bowel obstruction. EXAM: PORTABLE ABDOMEN - 1 VIEW COMPARISON:  Radiograph  06/02/2023 FINDINGS: Enteric contrast is seen in the ascending, transverse, and proximal descending colon. Diminished small bowel distension from prior exam. Enteric tube remains in place. IMPRESSION: Enteric contrast in the colon. Diminished small bowel distension from prior exam. Findings consistent with partial or resolving small bowel obstruction. Electronically Signed   By: Narda Rutherford M.D.   On: 06/04/2023 13:45           LOS: 3 days   Time spent= 35 mins    Miguel Rota, MD [facility]  If 7PM-7AM, please contact night-coverage  06/05/2023, 12:42 PM

## 2023-06-05 NOTE — Plan of Care (Signed)
  Problem: Education: Goal: Knowledge of General Education information will improve Description: Including pain rating scale, medication(s)/side effects and non-pharmacologic comfort measures Outcome: Progressing   Problem: Health Behavior/Discharge Planning: Goal: Ability to manage health-related needs will improve Outcome: Progressing   Problem: Activity: Goal: Risk for activity intolerance will decrease Outcome: Progressing   Problem: Nutrition: Goal: Adequate nutrition will be maintained Outcome: Progressing   Problem: Elimination: Goal: Will not experience complications related to urinary retention Outcome: Progressing   Problem: Pain Managment: Goal: General experience of comfort will improve and/or be controlled Outcome: Progressing   Problem: Safety: Goal: Ability to remain free from injury will improve Outcome: Progressing   Problem: Skin Integrity: Goal: Risk for impaired skin integrity will decrease Outcome: Progressing

## 2023-06-05 NOTE — Progress Notes (Signed)
Initial Nutrition Assessment  DOCUMENTATION CODES:   Severe malnutrition in context of acute illness/injury  INTERVENTION:  - Soft diet per MD.  - Ensure Plus High Protein po BID, each supplement provides 350 kcal and 20 grams of protein. - Diet education provided with handout per patient request. - Monitor weight trends.   NUTRITION DIAGNOSIS:   Severe Malnutrition related to acute illness (recurrent small bowel obstruction) as evidenced by energy intake < or equal to 50% for > or equal to 5 days, percent weight loss, mild fat depletion, mild muscle depletion (11.5% in less than 1 month).  GOAL:   Patient will meet greater than or equal to 90% of their needs  MONITOR:   PO intake, Supplement acceptance, Diet advancement, Weight trends  REASON FOR ASSESSMENT:   Consult Assessment of nutrition requirement/status, Diet education  ASSESSMENT:   73 y.o. female with PMH significant of GERD, HTN, history of small bowel obstruction (Jan 2025), and history of prior abdominal surgeries including prior ex-lap/Hartman's for diverticulitis in 2014 as well as an ostomy reversal, lysis of adhesions and appendectomy also in 2014 who presented with N/V and abdominal pain. Admitted for recurrent SBO.  2/8 Admit; NGT for suction 2/10 NGT removed 2/11 CLD then FLD  Patient in bedside chair at time of visit. Reports UBW of 110-112# and that she has likely lost weight since initial SBO in January.  Per EMR, patient weighed at 113# on initial admit for SBO on 1/15. She is now weighed at 100#. This is a 13# or 11.5% weight loss in <1 month, which is severe and significant for the time frame.   She reports previously eating very well at home, prior to initial SBO last month. Typically eats a large breakfast consisting of a boiled egg, cereal with fruit and almond milk, crackers with almond butter, and orange juice.  Usually only has a small snack for lunch, sometimes vegetables and hummus. Then has  a larger dinner consisting of chicken or salmon with vegetables and a baked potato . Will often have yogurt and cherry juice for a snack before bedtime. Also takes a [place] vitamin at home.   Over the past 1 month, she has been eating less as she was told during last admission to slow down her eating and really chew her foods. Notes this made her be less hungry.  She has not had anything to eat since admit until this AM, when she was advanced to clear liquids. At time of visit, she had had an [place] ice and some orange juice, which she reports tolerating well.  Patient with questions about what best to eat going forward to hopefully help her not develop another SBO.  Discussed for the time being, can trial a low fiber soft diet for easier digestion. Provided a handout on lower fiber tips and foods recommended. Discussed choosing tender lean meats, soft cooked vegetables, and fruit without skins/seeds. Also discussed continuing to ensure foods are well chewed before swallowing and trying to consume small and frequent meals as opposed to large meals. Patient plans to get a Nutri-Bullet at home and try and blend more fruits/vegetables. Reminded patient to also ensure she is drinking adequate fluid throughout the day and walking around as tolerated to keep things moving through her bowels.  Patient very appreciative of information. Discussed trying these tips and can slowly adjust back to other diet as tolerated.   Patient is hopeful to get to a soft diet this afternoon (now ordered). She is  agreeable to try an Ensure during admission. She has tried Boost before and enjoyed but only likes vanilla so will trial vanilla Ensure (no vanilla Boost in house). Encouraged patient to consider consuming after discharge as tolerated.    Medications reviewed and include: -  Labs reviewed:  K+ 3.0 Phosphorus 2.1   NUTRITION - FOCUSED PHYSICAL EXAM:  Flowsheet Row Most Recent Value  Orbital Region No  depletion  Upper Arm Region Mild depletion  Thoracic and Lumbar Region Mild depletion  Buccal Region No depletion  Temple Region No depletion  Clavicle Bone Region Mild depletion  Clavicle and Acromion Bone Region Mild depletion  Scapular Bone Region Unable to assess  Dorsal Hand No depletion  Patellar Region No depletion  Anterior Thigh Region No depletion  Posterior Calf Region No depletion  Edema (RD Assessment) None  Hair Reviewed  Eyes Reviewed  Mouth Reviewed  Skin Reviewed  Nails Reviewed       Diet Order:   Diet Order             DIET SOFT Room service appropriate? Yes; Fluid consistency: Thin  Diet effective now                   EDUCATION NEEDS:  Education needs have been addressed  Skin:  Skin Assessment: Reviewed RN Assessment  Last BM:  2/7  Height:  Ht Readings from Last 1 Encounters:  06/02/23 4\' 11"  (1.499 m)   Weight:  Wt Readings from Last 1 Encounters:  06/02/23 45.4 kg    BMI:  Body mass index is 20.22 kg/m.  Estimated Nutritional Needs:  Kcal:  1500-1650 kcals Protein:  70-80 grams Fluid:  >/= 1.5L    Shelle Iron RD, LDN Contact via Secure Chat.

## 2023-06-05 NOTE — Progress Notes (Signed)
Subjective: CC: NGT out. Tolerating cld without n/v. Mild bloating, no abdominal pain. Passing flatus. No BM. Mobilizing.   Afebrile. No tachycardia or hypotension. WBC wnl. K 3.0. Mg 2.1. Phos 2.1.   Objective: Vital signs in last 24 hours: Temp:  [97.4 F (36.3 C)-97.9 F (36.6 C)] 97.4 F (36.3 C) (02/11 0634) Pulse Rate:  [57-66] 59 (02/11 0634) Resp:  [16-18] 18 (02/11 0634) BP: (125-152)/(65-74) 126/74 (02/11 0634) SpO2:  [98 %-100 %] 100 % (02/11 0634) Last BM Date : 06/01/23  Intake/Output from previous day: 02/10 0701 - 02/11 0700 In: 1877 [P.O.:640; I.V.:1236.3; IV Piggyback:0.8] Out: 100 [Emesis/NG output:100] Intake/Output this shift: No intake/output data recorded.  PE: Gen:  Alert, NAD, pleasant Abd: Soft, improved mild distension, NT, +BS.   Lab Results:  Recent Labs    06/04/23 0438 06/05/23 0328  WBC 5.0 5.2  HGB 12.7 12.5  HCT 40.2 39.6  PLT 103* 80*   BMET Recent Labs    06/04/23 0438 06/05/23 0328  NA 143 138  K 3.7 3.0*  CL 113* 109  CO2 24 22  GLUCOSE 118* 97  BUN 13 8  CREATININE 0.50 0.42*  CALCIUM 8.7* 8.2*   PT/INR No results for input(s): "LABPROT", "INR" in the last 72 hours. CMP     Component Value Date/Time   NA 138 06/05/2023 0328   K 3.0 (L) 06/05/2023 0328   CL 109 06/05/2023 0328   CO2 22 06/05/2023 0328   GLUCOSE 97 06/05/2023 0328   BUN 8 06/05/2023 0328   CREATININE 0.42 (L) 06/05/2023 0328   CALCIUM 8.2 (L) 06/05/2023 0328   PROT 5.9 (L) 06/04/2023 0438   ALBUMIN 3.4 (L) 06/04/2023 0438   AST 19 06/04/2023 0438   ALT 13 06/04/2023 0438   ALKPHOS 59 06/04/2023 0438   BILITOT 0.7 06/04/2023 0438   GFRNONAA >60 06/05/2023 0328   GFRAA >90 03/22/2013 0410   Lipase     Component Value Date/Time   LIPASE 25 06/02/2023 0627    Studies/Results: DG Abd Portable 1V Result Date: 06/04/2023 CLINICAL DATA:  Small bowel obstruction. EXAM: PORTABLE ABDOMEN - 1 VIEW COMPARISON:  Radiograph 06/02/2023  FINDINGS: Enteric contrast is seen in the ascending, transverse, and proximal descending colon. Diminished small bowel distension from prior exam. Enteric tube remains in place. IMPRESSION: Enteric contrast in the colon. Diminished small bowel distension from prior exam. Findings consistent with partial or resolving small bowel obstruction. Electronically Signed   By: Narda Rutherford M.D.   On: 06/04/2023 13:45    Anti-infectives: Anti-infectives (From admission, onward)    None        Assessment/Plan Recurrent SBO  - She has a hx of prior hernia repair x 2 (as a child); abdominal hysterectomy; exlap/Hartmanns for diverticulitis 09/2012 (Dr. Lindie Spruce); and Ostomy reversal, lysis of adhesions, enterorrhaphy, appendectomy 02/2013 (Dr. Lindie Spruce) - HDS without fever, tachycardia or hypotension. No peritonitis on exam. WBC wnl. Contrast in colon on xray. Symptoms improved and having some bowel function. No current indication for emergency surgery - Keep K >=4, Phos >= 3, Mg >= 2 and mobilize for bowel function  - She is now passing flatus and has contrast in colon xray. Tolerating cld. Will adv to FLD. May be able to have soft diet later today if tolerates. Would like to ensure she tolerates diet advancement and has a bm before d/c.   - Given short interval recurrence and CT showing potentially similar location for her obstruction being  in the right central abdomen we offered her surgery during admission.  The planned procedure and material risks were discussed with the patient. We also discussed typical post-operative care. She is would like to hold off on surgery during admission. She can f/u with [place] to discuss this on an elective basis if she changes her mind.   FEN - FLD. IVF per primary. Replace K and Phos VTE - SCDs, okay for chem ppx from a general surgery standpoint ID - None  I reviewed nursing notes, last 24 h vitals and pain scores, last 48 h intake and output, last 24 h labs and trends,  and last 24 h imaging results.   LOS: 3 days    Jacinto Halim , [facility] 06/05/2023, 9:12 AM Please see Amion for pager number during day hours 7:00am-4:30pm

## 2023-06-05 NOTE — Plan of Care (Signed)
  Problem: Clinical Measurements: Goal: Ability to maintain clinical measurements within normal limits will improve Outcome: Progressing   Problem: Activity: Goal: Risk for activity intolerance will decrease Outcome: Progressing   Problem: Elimination: Goal: Will not experience complications related to bowel motility Outcome: Progressing   Problem: Pain Managment: Goal: General experience of comfort will improve and/or be controlled Outcome: Progressing   Problem: Safety: Goal: Ability to remain free from injury will improve Outcome: Progressing

## 2023-06-06 ENCOUNTER — Other Ambulatory Visit (HOSPITAL_COMMUNITY): Payer: Self-pay

## 2023-06-06 DIAGNOSIS — K56609 Unspecified intestinal obstruction, unspecified as to partial versus complete obstruction: Secondary | ICD-10-CM | POA: Diagnosis not present

## 2023-06-06 LAB — CBC
HCT: 39.6 % (ref 36.0–46.0)
Hemoglobin: 12.6 g/dL (ref 12.0–15.0)
MCH: 31.7 pg (ref 26.0–34.0)
MCHC: 31.8 g/dL (ref 30.0–36.0)
MCV: 99.7 fL (ref 80.0–100.0)
Platelets: 96 10*3/uL — ABNORMAL LOW (ref 150–400)
RBC: 3.97 MIL/uL (ref 3.87–5.11)
RDW: 13.2 % (ref 11.5–15.5)
WBC: 5.1 10*3/uL (ref 4.0–10.5)
nRBC: 0 % (ref 0.0–0.2)

## 2023-06-06 LAB — BASIC METABOLIC PANEL
Anion gap: 8 (ref 5–15)
BUN: 6 mg/dL — ABNORMAL LOW (ref 8–23)
CO2: 22 mmol/L (ref 22–32)
Calcium: 8.2 mg/dL — ABNORMAL LOW (ref 8.9–10.3)
Chloride: 104 mmol/L (ref 98–111)
Creatinine, Ser: 0.47 mg/dL (ref 0.44–1.00)
GFR, Estimated: >60 mL/min (ref >60)
Glucose, Bld: 90 mg/dL (ref 70–99)
Potassium: 3.7 mmol/L (ref 3.5–5.1)
Sodium: 134 mmol/L — ABNORMAL LOW (ref 135–145)

## 2023-06-06 LAB — MAGNESIUM: Magnesium: 2 mg/dL (ref 1.7–2.4)

## 2023-06-06 MED ORDER — ONDANSETRON 4 MG PO TBDP
4.0000 mg | ORAL_TABLET | Freq: Three times a day (TID) | ORAL | 0 refills | Status: DC | PRN
  Filled 2023-06-06: qty 30, 10d supply, fill #0

## 2023-06-06 NOTE — Discharge Summary (Signed)
Physician Discharge Summary  AAVYA SHAFER WUJ:[id number] DOB: 14-Oct-1950 DOA: 06/02/2023  PCP: Myrlene Broker, MD  Admit date: 06/02/2023 Discharge date: 06/06/2023  Admitted From: Home Disposition: Home  Recommendations for Outpatient Follow-up:  Follow up with PCP in 1-2 weeks Please obtain BMP/CBC in one week your next doctors visit.  As needed antiemetics   Discharge Condition: Stable CODE STATUS: Full code Diet recommendation: Regular  Brief/Interim Summary: Brief Narrative:  73 y.o. female with medical history significant of anemia,  GERD,  hypertension, history of small bowel obstruction and history of prior abdominal surgeries including prior exploratory laparotomy/Hartman's for diverticulitis in 2014 as well as an ostomy reversal, lysis of adhesions and appendectomy also in 2014 both performed by Dr. Lindie Spruce, presenting with nausea vomiting abdominal pain was a long hospital emergency department.  Upon evaluation in the emergency department CT imaging the abdomen pelvis revealed distal small bowel obstruction with transition point in the right lower quadrant.  The hospitalist was called to assess the patient for admission to the hospital. General surgery was consulted, patient was conservatively managed and slowly started improving.  Today she is doing significantly better and stable for discharge.  Assessment & Plan:  Principal Problem:   SBO (small bowel obstruction) (HCC) Active Problems:   Hyperglycemia   Essential hypertension, benign   Anogenital candidiasis in female    SBO (small bowel obstruction) (HCC) Ongoing conservative management with NG tube.  General surgery considering surgical intervention.  Hyperglycemia A1c 5.5  Essential hypertension, benign IV as needed  Anogenital candidiasis in female Recent diagnosis.  Was on fluconazole.  Continuing topical miconazole and zinc oxide  Hypokalemia/hypophosphatemia - As needed  repletion  Thrombocytopenia - In the setting of acute illness.  Slowly improving today   DVT prophylaxis: SCDs Start: 06/02/23 1130    Code Status: Full Code Family Communication:   Status is: Inpatient Remains inpatient appropriate because: Hopefully discharge today    Subjective: Doing well relating.  Wishes to go home today.  Examination:  General exam: Appears calm and comfortable  Respiratory system: Clear to auscultation. Respiratory effort normal. Cardiovascular system: S1 & S2 heard, RRR. No JVD, murmurs, rubs, gallops or clicks. No pedal edema. Gastrointestinal system: Abdomen is nondistended, soft and nontender. No organomegaly or masses felt. Normal bowel sounds heard. Central nervous system: Alert and oriented. No focal neurological deficits. Extremities: Symmetric 5 x 5 power. Skin: No rashes, lesions or ulcers Psychiatry: Judgement and insight appear normal. Mood & affect appropriate.    Discharge Diagnoses:  Principal Problem:   SBO (small bowel obstruction) (HCC) Active Problems:   Hyperglycemia   Essential hypertension, benign   Anogenital candidiasis in female   Protein-calorie malnutrition, severe      Discharge Exam: Vitals:   06/05/23 2126 06/06/23 0517  BP: (!) 139/59 (!) 152/74  Pulse: 66 64  Resp: 18 18  Temp: 98.1 F (36.7 C) 98.4 F (36.9 C)  SpO2: 92% 96%   Vitals:   06/05/23 0634 06/05/23 1322 06/05/23 2126 06/06/23 0517  BP: 126/74 (!) 127/55 (!) 139/59 (!) 152/74  Pulse: (!) 59 (!) 55 66 64  Resp: 18 18 18 18   Temp: (!) 97.4 F (36.3 C) 97.7 F (36.5 C) 98.1 F (36.7 C) 98.4 F (36.9 C)  TempSrc: Oral   Oral  SpO2: 100% 100% 92% 96%  Weight:      Height:          Discharge Instructions  Discharge Instructions     meds to beds  pharmacy consult ([facility] ONLY)   Complete by: As directed    meds to beds pharmacy consult ([facility] ONLY)   Complete by: As directed       Allergies as of 06/06/2023        Reactions   Caffeine Nausea And Vomiting, Palpitations   Makes heart race   Fluad Quadrivalent [influenza Vac A&b Sa Adj Quad] Nausea And Vomiting   Pt allergic to the 65 and over version of the flu vaccine, tolerates regular flu vaccine    Sodium Lauryl Sulfate Rash   Swollen gums        Medication List     STOP taking these medications    fluconazole 150 MG tablet Commonly known as: DIFLUCAN       TAKE these medications    barrier cream Crea Commonly known as: non-specified Apply 1 Application topically 2 (two) times daily as needed.   loratadine 10 MG tablet Commonly known as: CLARITIN Take 10 mg by mouth daily.   ondansetron 4 MG disintegrating tablet Commonly known as: ZOFRAN-ODT Take 1 tablet (4 mg total) by mouth every 8 (eight) hours as needed for nausea or vomiting.   pediatric multivitamin chewable tablet Chew 1 tablet by mouth daily. [place]   sodium chloride 0.65 % Soln nasal spray Commonly known as: OCEAN Place 2-3 sprays into both nostrils 2 (two) times daily.   ULTRAFLORA IMMUNE HEALTH PO Take 1 capsule by mouth daily.        Follow-up Information     Myrlene Broker, MD Follow up in 1 week(s).   Specialty: Internal Medicine Contact information: [address] [place] [address] [phone]                Allergies  Allergen Reactions   Caffeine Nausea And Vomiting and Palpitations    Makes heart race    Fluad Quadrivalent [Influenza Vac A&B Sa Adj Quad] Nausea And Vomiting    Pt allergic to the 65 and over version of the flu vaccine, tolerates regular flu vaccine    Sodium Lauryl Sulfate Rash    Swollen gums     You were cared for by a hospitalist during your hospital stay. If you have any questions about your discharge medications or the care you received while you were in the hospital after you are discharged, you can call the unit and asked to speak with the hospitalist on call if the hospitalist that  took care of you is not available. Once you are discharged, your primary care physician will handle any further medical issues. Please note that no refills for any discharge medications will be authorized once you are discharged, as it is imperative that you return to your primary care physician (or establish a relationship with a primary care physician if you do not have one) for your aftercare needs so that they can reassess your need for medications and monitor your lab values.  You were cared for by a hospitalist during your hospital stay. If you have any questions about your discharge medications or the care you received while you were in the hospital after you are discharged, you can call the unit and asked to speak with the hospitalist on call if the hospitalist that took care of you is not available. Once you are discharged, your primary care physician will handle any further medical issues. Please note that NO REFILLS for any discharge medications will be authorized once you are discharged, as it is imperative that you  return to your primary care physician (or establish a relationship with a primary care physician if you do not have one) for your aftercare needs so that they can reassess your need for medications and monitor your lab values.  Please request your Prim.MD to go over all Hospital Tests and Procedure/Radiological results at the follow up, please get all Hospital records sent to your Prim MD by signing hospital release before you go home.  Get CBC, CMP, 2 view Chest X ray checked  by Primary MD during your next visit or SNF MD in 5-7 days ( we routinely change or add medications that can affect your baseline labs and fluid status, therefore we recommend that you get the mentioned basic workup next visit with your PCP, your PCP may decide not to get them or add new tests based on their clinical decision)  On your next visit with your primary care physician please Get Medicines reviewed  and adjusted.  If you experience worsening of your admission symptoms, develop shortness of breath, life threatening emergency, suicidal or homicidal thoughts you must seek medical attention immediately by calling 911 or calling your MD immediately  if symptoms less severe.  You Must read complete instructions/literature along with all the possible adverse reactions/side effects for all the Medicines you take and that have been prescribed to you. Take any new Medicines after you have completely understood and accpet all the possible adverse reactions/side effects.   Do not drive, operate heavy machinery, perform activities at heights, swimming or participation in water activities or provide baby sitting services if your were admitted for syncope or siezures until you have seen by Primary MD or a Neurologist and advised to do so again.  Do not drive when taking Pain medications.   Procedures/Studies: DG Abd Portable 1V Result Date: 06/04/2023 CLINICAL DATA:  Small bowel obstruction. EXAM: PORTABLE ABDOMEN - 1 VIEW COMPARISON:  Radiograph 06/02/2023 FINDINGS: Enteric contrast is seen in the ascending, transverse, and proximal descending colon. Diminished small bowel distension from prior exam. Enteric tube remains in place. IMPRESSION: Enteric contrast in the colon. Diminished small bowel distension from prior exam. Findings consistent with partial or resolving small bowel obstruction. Electronically Signed   By: Narda Rutherford M.D.   On: 06/04/2023 13:45   DG Abd Portable 1V-Small Bowel Obstruction Protocol-initial, 8 hr delay Result Date: 06/02/2023 CLINICAL DATA:  Small bowel obstruction, 8 hour delay EXAM: PORTABLE ABDOMEN - 1 VIEW COMPARISON:  CT earlier today. FINDINGS: NG tube is in the stomach. Contrast material predominantly within the fundus of the stomach. A small amount of contrast noted within the proximal small bowel loops. Continued small bowel dilatation. Gas seen within the colon. No  organomegaly or free air. IMPRESSION: Most of the contrast is in the stomach with a small amount contrast in the proximal small bowel. Continued small bowel obstruction pattern. Electronically Signed   By: Charlett Nose M.D.   On: 06/02/2023 22:09   DG Abd Portable 1V-Small Bowel Protocol-Position Verification Result Date: 06/02/2023 CLINICAL DATA:  [id number] Encounter for imaging study to confirm nasogastric (NG) tube placement [id number] EXAM: PORTABLE ABDOMEN - 1 VIEW COMPARISON:  CT abdomen/pelvis from earlier today FINDINGS: Enteric tube terminates in the mid body of the stomach. Clear lung bases. Excreted contrast noted in the nondilated renal collecting systems. Moderate colonic stool and gas. No dilated small bowel loops. No evidence of pneumatosis or pneumoperitoneum. IMPRESSION: Enteric tube terminates in the mid body of the stomach. Electronically Signed   By:  Delbert Phenix M.D.   On: 06/02/2023 12:01   CT ABDOMEN PELVIS W CONTRAST Result Date: 06/02/2023 CLINICAL DATA:  Acute abdominal pain beginning last night. Vomiting. EXAM: CT ABDOMEN AND PELVIS WITH CONTRAST TECHNIQUE: Multidetector CT imaging of the abdomen and pelvis was performed using the standard protocol following bolus administration of intravenous contrast. RADIATION DOSE REDUCTION: This exam was performed according to the departmental dose-optimization program which includes automated exposure control, adjustment of the mA and/or kV according to patient size and/or use of iterative reconstruction technique. CONTRAST:  OMNIPAQUE IOHEXOL 300 MG/ML  SOLN COMPARISON:  05/08/2023 FINDINGS: Lower Chest: No acute findings.  Pectus excavatum again noted. Hepatobiliary: No suspicious hepatic masses identified. Stable small right hepatic lobe cyst. Gallbladder is unremarkable. No evidence of biliary ductal dilatation. Pancreas:  No mass or inflammatory changes. Spleen: Within normal limits in size and appearance. Adrenals/Urinary Tract: No  suspicious masses identified. No evidence of ureteral calculi or hydronephrosis. Stomach/Bowel: Surgical anastomosis again seen in rectosigmoid colon. Moderately dilated small bowel loops are again seen with air-fluid levels mildly increased since previous study. Transition point to nondilated small bowel loops seen in the right lower quadrant, consistent with distal small-bowel obstruction and likely due to adhesion. No mass, inflammatory process, abscess identified. Vascular/Lymphatic: No pathologically enlarged lymph nodes. No acute vascular findings. Reproductive: Poorly visualized due to severe artifact from bilateral hip prostheses. Other:  None. Musculoskeletal:  No suspicious bone lesions identified. IMPRESSION: Distal small-bowel obstruction, with transition point in right lower quadrant likely due to adhesion. No mass or inflammatory process identified. Electronically Signed   By: Danae [name] M.D.   On: 06/02/2023 09:55   DG Abd Portable 1V Result Date: 05/10/2023 CLINICAL DATA:  Small bowel obstruction. EXAM: PORTABLE ABDOMEN - 1 VIEW COMPARISON:  Abdominal x-ray from yesterday. FINDINGS: Unchanged enteric tube in the stomach. Oral contrast has reached the splenic flexure of the colon. Small bowel dilatation has resolved. No acute osseous abnormality. IMPRESSION: 1. Resolved small bowel obstruction. Electronically Signed   By: Obie Dredge M.D.   On: 05/10/2023 08:46   DG Abd Portable 1V-Small Bowel Obstruction Protocol-initial, 8 hr delay Result Date: 05/09/2023 CLINICAL DATA:  73 year old female with suspected small-bowel obstruction. 8 hour delayed film. EXAM: PORTABLE ABDOMEN - 1 VIEW COMPARISON:  05/08/2023 at 7:58 p.m. FINDINGS: Single view of the abdomen demonstrates a nasogastric tube in position with tip in the mid body of the stomach. Oral contrast material is noted lying dependently in the stomach. No definite oral contrast material is noted within the bowel. Multiple dilated loops of  small bowel are noted throughout the central abdomen measuring up to approximately 4 cm. Gas and stool are also noted throughout the colon and rectum. No definite pneumoperitoneum. Status post bilateral hip arthroplasty. IMPRESSION: 1. Support apparatus, as above. 2. Bowel-gas pattern remains concerning for probable small-bowel obstruction, as above. Electronically Signed   By: Trudie Reed M.D.   On: 05/09/2023 05:30   DG Abd Portable 1V-Small Bowel Protocol-Position Verification Result Date: 05/08/2023 CLINICAL DATA:  Nasogastric tube placement. EXAM: PORTABLE ABDOMEN - 1 VIEW COMPARISON:  CT earlier today FINDINGS: Tip and side port of the enteric tube in the left upper quadrant in the region of the stomach, the stomach is dilated on CT. Focal dilatation in the upper abdomen was characterized on CT. IMPRESSION: Tip and side port of the enteric tube below the diaphragm in the stomach. Electronically Signed   By: Narda Rutherford M.D.   On: 05/08/2023 20:07  CT ABDOMEN PELVIS W CONTRAST Result Date: 05/08/2023 CLINICAL DATA:  Abdominal pain EXAM: CT ABDOMEN AND PELVIS WITH CONTRAST TECHNIQUE: Multidetector CT imaging of the abdomen and pelvis was performed using the standard protocol following bolus administration of intravenous contrast. RADIATION DOSE REDUCTION: This exam was performed according to the departmental dose-optimization program which includes automated exposure control, adjustment of the mA and/or kV according to patient size and/or use of iterative reconstruction technique. CONTRAST:  75mL OMNIPAQUE IOHEXOL 350 MG/ML SOLN COMPARISON:  MRI abdomen dated 06/11/2022. CT abdomen/pelvis dated 03/16/2014. FINDINGS: Lower chest: Mild scarring/atelectasis in the bilateral lower lungs. Hepatobiliary: 13 mm cyst in the posterior right hepatic lobe (series 2/image 19), benign. Gallbladder is unremarkable. No intrahepatic or extrahepatic ductal dilatation. 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 Given long-term stability dating back to at least 2016, no follow-up is recommended. No pancreatic atrophy or ductal dilatation. Spleen: Within normal limits. Adrenals/Urinary Tract: Adrenal glands are within normal limits. Kidneys are within normal limits.  No hydronephrosis. Bladder is underdistended and poorly evaluated due to streak artifact. Stomach/Bowel: Stomach is within normal limits. Dilated loops of small bowel in the right mid abdomen (series 2/image 35) with very mild central mesenteric swirling (series 2/image 42). This appearance favors mild small bowel obstruction, possibly on the basis of internal hernia, although equivocal. Appendix is not discretely visualized. Status post left hemicolectomy with suture line in the lower pelvis (series 2/image 5). No colonic wall thickening or inflammatory changes. Vascular/Lymphatic: No evidence of abdominal aortic aneurysm. No suspicious abdominopelvic lymphadenopathy. Reproductive: Suspected prior hysterectomy, although poorly evaluated due to streak artifact. No adnexal masses. Other: No abdominopelvic ascites.  No free air. Musculoskeletal: Degenerative changes of the visualized thoracolumbar spine. Bilateral hip arthroplasties. IMPRESSION: Dilated loops of small bowel in the right mid abdomen, favoring mild small-bowel obstruction, possibly on the basis of internal hernia, although equivocal. Status post left hemicolectomy. Additional ancillary findings as above. Electronically Signed   By: Charline Bills M.D.   On: 05/08/2023 03:11     The results of significant diagnostics from this hospitalization (including imaging, microbiology, ancillary and laboratory) are listed below for reference.     Microbiology: No results found for this or any previous visit (from the past 240 hours).   Labs: BNP (last 3 results) No results for input(s): "BNP" in the last 8760  hours. Basic Metabolic Panel: Recent Labs  Lab 06/02/23 0627 06/03/23 0348 06/04/23 0438 06/05/23 0328 06/06/23 0338  NA 137 145 143 138 134*  K 4.5 3.8 3.7 3.0* 3.7  CL 102 112* 113* 109 104  CO2 25 24 24 22 22   GLUCOSE 189* 116* 118* 97 90  BUN 14 16 13 8  6*  CREATININE 0.82 0.65 0.50 0.42* 0.47  CALCIUM 9.9 8.9 8.7* 8.2* 8.2*  MG  --  2.4 2.5* 2.1 2.0  PHOS  --   --   --  2.1*  --    Liver Function Tests: Recent Labs  Lab 06/02/23 0627 06/03/23 0348 06/04/23 0438  AST 22 19 19   ALT 16 13 13   ALKPHOS 95 75 59  BILITOT 0.8 0.9 0.7  PROT 7.7 6.3* 5.9*  ALBUMIN 4.7 3.9 3.4*   Recent Labs  Lab 06/02/23 0627  LIPASE 25   No results for input(s): "AMMONIA" in the last 168 hours. CBC: Recent Labs  Lab 06/02/23 0627 06/03/23 0348 06/04/23 0438 06/05/23 0328 06/06/23 0338  WBC 9.5 8.4  5.0 5.2 5.1  NEUTROABS  --   --  2.6  --   --   HGB 16.5* 13.8 12.7 12.5 12.6  HCT 50.7* 43.6 40.2 39.6 39.6  MCV 96.9 97.8 99.0 98.0 99.7  PLT 152 121* 103* 80* 96*   Cardiac Enzymes: No results for input(s): "CKTOTAL", "CKMB", "CKMBINDEX", "TROPONINI" in the last 168 hours. BNP: Invalid input(s): "POCBNP" CBG: No results for input(s): "GLUCAP" in the last 168 hours. D-Dimer No results for input(s): "DDIMER" in the last 72 hours. Hgb A1c Recent Labs    06/04/23 0438  HGBA1C 5.5   Lipid Profile No results for input(s): "CHOL", "HDL", "LDLCALC", "TRIG", "CHOLHDL", "LDLDIRECT" in the last 72 hours. Thyroid function studies No results for input(s): "TSH", "T4TOTAL", "T3FREE", "THYROIDAB" in the last 72 hours.  Invalid input(s): "FREET3" Anemia work up No results for input(s): "VITAMINB12", "FOLATE", "FERRITIN", "TIBC", "IRON", "RETICCTPCT" in the last 72 hours. Urinalysis    Component Value Date/Time   COLORURINE AMBER (A) 06/02/2023 0747   APPEARANCEUR HAZY (A) 06/02/2023 0747   LABSPEC 1.024 06/02/2023 0747   PHURINE 5.0 06/02/2023 0747   GLUCOSEU NEGATIVE  06/02/2023 0747   GLUCOSEU NEGATIVE 09/25/2017 1057   HGBUR SMALL (A) 06/02/2023 0747   BILIRUBINUR NEGATIVE 06/02/2023 0747   BILIRUBINUR negative 07/05/2021 0936   KETONESUR 20 (A) 06/02/2023 0747   PROTEINUR 100 (A) 06/02/2023 0747   UROBILINOGEN negative (A) 07/05/2021 0936   UROBILINOGEN 0.2 09/25/2017 1057   NITRITE NEGATIVE 06/02/2023 0747   LEUKOCYTESUR NEGATIVE 06/02/2023 0747   Sepsis Labs Recent Labs  Lab 06/03/23 0348 06/04/23 0438 06/05/23 0328 06/06/23 0338  WBC 8.4 5.0 5.2 5.1   Microbiology No results found for this or any previous visit (from the past 240 hours).   Time coordinating discharge:  I have spent 35 minutes face to face with the patient and on the ward discussing the patients care, assessment, plan and disposition with other care givers. >50% of the time was devoted counseling the patient about the risks and benefits of treatment/Discharge disposition and coordinating care.   SIGNED:   Miguel Rota, MD  [facility] 06/06/2023, 12:10 PM   If 7PM-7AM, please contact night-coverage

## 2023-06-06 NOTE — Progress Notes (Signed)
Medications delivered from [facility] outpatient pharmacy

## 2023-06-06 NOTE — Plan of Care (Signed)

## 2023-06-06 NOTE — Progress Notes (Signed)
Subjective: CC: Patient reports no abdominal pain.  Tolerating soft diet without nausea or vomiting.  Her abdomen no longer feels tight.  Passing flatus.  2-3 BMs in the last 24 hours.  Afebrile. No tachycardia or hypotension. WBC wnl  Objective: Vital signs in last 24 hours: Temp:  [97.7 F (36.5 C)-98.4 F (36.9 C)] 98.4 F (36.9 C) (02/12 0517) Pulse Rate:  [55-66] 64 (02/12 0517) Resp:  [18] 18 (02/12 0517) BP: (127-152)/(55-74) 152/74 (02/12 0517) SpO2:  [92 %-100 %] 96 % (02/12 0517) Last BM Date : 06/06/23  Intake/Output from previous day: 02/11 0701 - 02/12 0700 In: 660 [P.O.:400; IV Piggyback:260] Out: -  Intake/Output this shift: No intake/output data recorded.  PE: Gen:  Alert, NAD, pleasant Abd: Soft, improved mild distension, NT, +BS.   Lab Results:  Recent Labs    06/05/23 0328 06/06/23 0338  WBC 5.2 5.1  HGB 12.5 12.6  HCT 39.6 39.6  PLT 80* 96*   BMET Recent Labs    06/05/23 0328 06/06/23 0338  NA 138 134*  K 3.0* 3.7  CL 109 104  CO2 22 22  GLUCOSE 97 90  BUN 8 6*  CREATININE 0.42* 0.47  CALCIUM 8.2* 8.2*   PT/INR No results for input(s): "LABPROT", "INR" in the last 72 hours. CMP     Component Value Date/Time   NA 134 (L) 06/06/2023 0338   K 3.7 06/06/2023 0338   CL 104 06/06/2023 0338   CO2 22 06/06/2023 0338   GLUCOSE 90 06/06/2023 0338   BUN 6 (L) 06/06/2023 0338   CREATININE 0.47 06/06/2023 0338   CALCIUM 8.2 (L) 06/06/2023 0338   PROT 5.9 (L) 06/04/2023 0438   ALBUMIN 3.4 (L) 06/04/2023 0438   AST 19 06/04/2023 0438   ALT 13 06/04/2023 0438   ALKPHOS 59 06/04/2023 0438   BILITOT 0.7 06/04/2023 0438   GFRNONAA >60 06/06/2023 0338   GFRAA >90 03/22/2013 0410   Lipase     Component Value Date/Time   LIPASE 25 06/02/2023 0627    Studies/Results: DG Abd Portable 1V Result Date: 06/04/2023 CLINICAL DATA:  Small bowel obstruction. EXAM: PORTABLE ABDOMEN - 1 VIEW COMPARISON:  Radiograph 06/02/2023 FINDINGS:  Enteric contrast is seen in the ascending, transverse, and proximal descending colon. Diminished small bowel distension from prior exam. Enteric tube remains in place. IMPRESSION: Enteric contrast in the colon. Diminished small bowel distension from prior exam. Findings consistent with partial or resolving small bowel obstruction. Electronically Signed   By: Narda Rutherford M.D.   On: 06/04/2023 13:45    Anti-infectives: Anti-infectives (From admission, onward)    None        Assessment/Plan Recurrent SBO  - Given short interval recurrence and CT showing potentially similar location for her obstruction being in the right central abdomen we offered her surgery during admission.  The planned procedure and material risks were discussed with the patient. We also discussed typical post-operative care. She is would like to hold off on surgery during admission. She can f/u with [place] to discuss this on an elective basis if she changes her mind.   -Clinically and radiographically resolving.  She had contrast in her colon on 2/10 abdominal film.  Now tolerating soft diet without abdominal pain, nausea or vomiting and is having bowel function.  Okay for discharge from our standpoint.  I reviewed nursing notes, last 24 h vitals and pain scores, last 48 h intake and output, last 24 h labs and  trends, and last 24 h imaging results.   LOS: 4 days    Jacinto Halim , [facility] 06/06/2023, 9:06 AM Please see Amion for pager number during day hours 7:00am-4:30pm

## 2023-06-07 ENCOUNTER — Telehealth: Payer: Self-pay

## 2023-06-07 NOTE — Transitions of Care (Post Inpatient/ED Visit) (Signed)
   06/07/2023  Name: Alyssa Robinson MRN: [id number] DOB: [birth year]  Today's TOC FU Call Status: Today's TOC FU Call Status:: Unsuccessful Call (1st Attempt) Unsuccessful Call (1st Attempt) Date: 06/07/23  Attempted to reach the patient regarding the most recent Inpatient/ED visit.   No answer. Unable to leave a voice mail   Follow Up Plan: Additional outreach attempts will be made to reach the patient to complete the Transitions of Care (Post Inpatient/ED visit) call.    Gabriel Cirri MSN, RN RN Case Sales executive Health  [facility]Population [facility] Office Hours Wed/Thur  8:00 am-6:00 pm Direct Dial: [phone] Main Phone [phone]  Fax: [phone] [email or web address]

## 2023-06-11 ENCOUNTER — Telehealth: Payer: Self-pay | Admitting: *Deleted

## 2023-06-11 ENCOUNTER — Other Ambulatory Visit (HOSPITAL_COMMUNITY): Payer: Self-pay

## 2023-06-11 NOTE — Transitions of Care (Post Inpatient/ED Visit) (Signed)
06/11/2023  Name: Alyssa Robinson MRN: [id number] DOB: 1950-07-21  Today's TOC FU Call Status: Today's TOC FU Call Status:: Successful TOC FU Call Completed TOC FU Call Complete Date: 06/11/23 Patient's Name and Date of Birth confirmed.  Transition Care Management Follow-up Telephone Call Date of Discharge: 06/06/23 Discharge Facility: [facility]) Type of Discharge: Inpatient Admission Primary Inpatient Discharge Diagnosis:: Recurrent small bowel obstruction: patient declined surgery How have you been since you were released from the hospital?: Better ("I am much better- already out and about, driving, shopping.  I am still completely independent; I appreciate you getting me this appointment with Dr. Okey Dupre.  I will call you if anything comes up after this hospital visit.  I don't want weekly calls") Any questions or concerns?: No  Items Reviewed: Did you receive and understand the discharge instructions provided?: Yes (thoroughly reviewed with patient who verbalizes good understanding of same) Medications obtained,verified, and reconciled?: Yes (Medications Reviewed) (Full medication reconciliation/ review completed; no concerns or discrepancies identified; confirmed patient obtained/ is taking all newly Rx'd medications as instructed; self-manages medications and denies questions/ concerns around medications today) Any new allergies since your discharge?: No Dietary orders reviewed?: Yes Type of Diet Ordered:: soft diet Do you have support at home?: Yes People in Home: spouse Name of Support/Comfort Primary Source: Reports independent in self-care activities; supportive spouse assists as/ if needed/ indicated  Medications Reviewed Today: Medications Reviewed Today     Reviewed by Michaela Corner, RN (Registered Nurse) on 06/11/23 at 1507  Med List Status: <None>   Medication Order Taking? Sig Documenting Provider Last Dose Status Informant  barrier cream (NON-SPECIFIED) CREA  [id number] Yes Apply 1 Application topically 2 (two) times daily as needed. [provider] Taking Active Self, Pharmacy Records  loratadine (CLARITIN) 10 MG tablet [id number] Yes Take 10 mg by mouth daily. [provider] Taking Active Self, Pharmacy Records  ondansetron (ZOFRAN-ODT) 4 MG disintegrating tablet [id number] Yes Take 1 tablet (4 mg total) by mouth every 8 (eight) hours as needed for nausea or vomiting. Miguel Rota, MD Taking Active   Pediatric Multiple Vit-C-FA (PEDIATRIC MULTIVITAMIN) chewable tablet [id number] No Chew 1 tablet by mouth daily. [place]  Patient not taking: Reported on 06/11/2023   [provider] Not Taking Active Self, Pharmacy Records  Probiotic Product [facility] PO) [id number] Yes Take 1 capsule by mouth daily. [provider] Taking Active Self, Pharmacy Records  sodium chloride (OCEAN) 0.65 % SOLN nasal spray [id number] Yes Place 2-3 sprays into both nostrils 2 (two) times daily. [provider] Taking Active Self, Pharmacy Records           Home Care and Equipment/Supplies: Were Home Health Services Ordered?: No Any new equipment or medical supplies ordered?: No  Functional Questionnaire: Do you need assistance with bathing/showering or dressing?: No Do you need assistance with meal preparation?: No Do you need assistance with eating?: No Do you have difficulty maintaining continence: No Do you need assistance with getting out of bed/getting out of a chair/moving?: No Do you have difficulty managing or taking your medications?: No  Follow up appointments reviewed: PCP Follow-up appointment confirmed?: Yes (care coordination outreach in real-time with scheduling care guide to successfully schedule hospital follow up PCP appointment 06/14/23) Date of PCP follow-up appointment?: 06/14/23 Follow-up Provider: PCP- Dr. Okey Dupre Specialist [facility] Follow-up appointment confirmed?: NA (verified not  indicated per hospital discharging provider discharge notes) Do you need transportation to your follow-up appointment?: No Do you  understand care options if your condition(s) worsen?: Yes-patient verbalized understanding  SDOH Interventions Today    Flowsheet Row Most Recent Value  SDOH Interventions   Food Insecurity Interventions Intervention Not Indicated  Housing Interventions Intervention Not Indicated  Transportation Interventions Intervention Not Indicated  [continues to drive self]  Utilities Interventions Intervention Not Indicated      Interventions Today    Flowsheet Row Most Recent Value  Chronic Disease   Chronic disease during today's visit Other  [recurrent small bowel obstruction without need for surgery]  General Interventions   General Interventions Discussed/Reviewed General Interventions Discussed, Durable Medical Equipment (DME), Doctor Visits  Doctor Visits Discussed/Reviewed Doctor Visits Discussed, PCP  Durable Medical Equipment (DME) Other  [confirmed not currently requiring/ using assistive devices for ambulation]  PCP/Specialist Visits Compliance with follow-up visit  Nutrition Interventions   Nutrition Discussed/Reviewed Nutrition Discussed  Pharmacy Interventions   Pharmacy Dicussed/Reviewed Pharmacy Topics Discussed  [Full medication review with updating medication list in EHR per patient report]      TOC Interventions Today    Flowsheet Row Most Recent Value  TOC Interventions   TOC Interventions Discussed/Reviewed TOC Interventions Discussed, Arranged PCP follow up less than 12 days/Care Guide scheduled  [Patient declines need for ongoing/ further care management outreach,  declines enrollment in 30-day TOC program,  provided my direct contact information should questions/ concerns/ needs arise post-TOC call]      Total time spent from review to signing of note/ including any care coordination interventions:  26 minutes  Caryl Pina,  RN, BSN, Media planner  Transitions of Care  [facility] - Population Health  [place] [phone]: direct office

## 2023-06-14 ENCOUNTER — Inpatient Hospital Stay: Payer: BLUE CROSS/BLUE SHIELD | Admitting: Internal Medicine

## 2023-06-15 ENCOUNTER — Encounter: Payer: Self-pay | Admitting: Internal Medicine

## 2023-06-15 ENCOUNTER — Inpatient Hospital Stay: Payer: BLUE CROSS/BLUE SHIELD | Admitting: Family Medicine

## 2023-06-15 ENCOUNTER — Ambulatory Visit (INDEPENDENT_AMBULATORY_CARE_PROVIDER_SITE_OTHER): Payer: Medicare Other | Admitting: Internal Medicine

## 2023-06-15 VITALS — BP 138/80 | HR 76 | Temp 97.6°F | Ht 59.0 in | Wt 105.0 lb

## 2023-06-15 DIAGNOSIS — E441 Mild protein-calorie malnutrition: Secondary | ICD-10-CM | POA: Diagnosis not present

## 2023-06-15 DIAGNOSIS — K56609 Unspecified intestinal obstruction, unspecified as to partial versus complete obstruction: Secondary | ICD-10-CM | POA: Diagnosis not present

## 2023-06-15 MED ORDER — MICONAZOLE NITRATE 2 % EX CREA: 1.0000 | TOPICAL_CREAM | Freq: Two times a day (BID) | CUTANEOUS | 0 refills | Status: DC

## 2023-06-15 MED ORDER — ZINC OXIDE 20 % EX OINT: 1.0000 | TOPICAL_OINTMENT | CUTANEOUS | 0 refills | Status: DC | PRN

## 2023-06-15 NOTE — Progress Notes (Signed)
   Subjective:   Patient ID: Alyssa Robinson, female    DOB: June 23, 1950, 73 y.o.   MRN: [id number]  HPI The patient is a 73 YO female coming in for hospital follow up (admitted with recurrence SBO, treated conservatively). She is doing well and still on liquid diet. She is scared of another SBO. Mild nausea intermittent she does not take anything. Having regular BM and taking colace regularly. Denies constipation. Denies other new concerns.  PMH, [facility], social history reviewed and updated  Review of Systems  Constitutional: Negative.   HENT: Negative.    Eyes: Negative.   Respiratory:  Negative for cough, chest tightness and shortness of breath.   Cardiovascular:  Negative for chest pain, palpitations and leg swelling.  Gastrointestinal:  Positive for nausea. Negative for abdominal distention, abdominal pain, constipation, diarrhea and vomiting.  Musculoskeletal: Negative.   Skin: Negative.   Neurological: Negative.   Psychiatric/Behavioral: Negative.      Objective:  Physical Exam Constitutional:      Appearance: She is well-developed.  HENT:     Head: Normocephalic and atraumatic.  Cardiovascular:     Rate and Rhythm: Normal rate and regular rhythm.  Pulmonary:     Effort: Pulmonary effort is normal. No respiratory distress.     Breath sounds: Normal breath sounds. No wheezing or rales.  Abdominal:     General: Bowel sounds are normal. There is no distension.     Palpations: Abdomen is soft.     Tenderness: There is no abdominal tenderness. There is no rebound.  Musculoskeletal:     Cervical back: Normal range of motion.  Skin:    General: Skin is warm and dry.  Neurological:     Mental Status: She is alert and oriented to person, place, and time.     Coordination: Coordination normal.     Vitals:   06/15/23 0908  BP: 138/80  Pulse: 76  Temp: 97.6 F (36.4 C)  TempSrc: Oral  SpO2: 96%  Weight: 105 lb (47.6 kg)  Height: 4\' 11"  (1.499 m)    Assessment & Plan:

## 2023-06-15 NOTE — Telephone Encounter (Signed)
Copied from CRM [id number]. Topic: Referral - Question >> Jun 15, 2023 11:28 AM Truddie Crumble wrote: Reason for CRM: patient called stating the doctor wanted the name of the place she wanted to be referred to. The name of the place is [facility]  There number is [phone]

## 2023-06-15 NOTE — Assessment & Plan Note (Signed)
Weight has declined since SBO. I have recommend we continue liquid/puree for 2-4 weeks after SBO and she wants to talk to nutrition to help her ensure adequate intake. She will then plan to add soft foods very gradually if no recurrence SBO at 1 month. We will follow up with her in 1-2 months to guide diet. Referral to nutrition.

## 2023-06-15 NOTE — Patient Instructions (Addendum)
I would keep to same diet for about 4 weeks from the obstruction then try to gradually increase the diet.  We will get you in with nutritionist.

## 2023-06-18 DIAGNOSIS — R92333 Mammographic heterogeneous density, bilateral breasts: Secondary | ICD-10-CM | POA: Diagnosis not present

## 2023-06-18 DIAGNOSIS — Z1231 Encounter for screening mammogram for malignant neoplasm of breast: Secondary | ICD-10-CM | POA: Diagnosis not present

## 2023-06-18 LAB — HM MAMMOGRAPHY

## 2023-06-20 ENCOUNTER — Encounter: Payer: Self-pay | Admitting: Internal Medicine

## 2023-06-21 ENCOUNTER — Encounter: Payer: Self-pay | Admitting: Physical Therapy

## 2023-06-21 ENCOUNTER — Other Ambulatory Visit: Payer: Self-pay

## 2023-06-21 ENCOUNTER — Ambulatory Visit: Payer: Medicare Other | Attending: Internal Medicine | Admitting: Physical Therapy

## 2023-06-21 DIAGNOSIS — R279 Unspecified lack of coordination: Secondary | ICD-10-CM | POA: Insufficient documentation

## 2023-06-21 DIAGNOSIS — K56609 Unspecified intestinal obstruction, unspecified as to partial versus complete obstruction: Secondary | ICD-10-CM | POA: Insufficient documentation

## 2023-06-21 DIAGNOSIS — R293 Abnormal posture: Secondary | ICD-10-CM | POA: Diagnosis not present

## 2023-06-21 DIAGNOSIS — M6281 Muscle weakness (generalized): Secondary | ICD-10-CM | POA: Insufficient documentation

## 2023-06-21 NOTE — Therapy (Signed)
 OUTPATIENT PHYSICAL THERAPY FEMALE PELVIC EVALUATION   Patient Name: Alyssa Robinson MRN: [id number] DOB:1950/10/01, 73 y.o., female Today's Date: 06/21/2023  END OF SESSION:  PT End of Session - 06/21/23 0803     Visit Number 1    Date for PT Re-Evaluation 08/19/23    Authorization Type MCR    PT Start Time 0800    PT Stop Time 0842    PT Time Calculation (min) 42 min    Activity Tolerance Patient tolerated treatment well    Behavior During Therapy WFL for tasks assessed/performed             Past Medical History:  Diagnosis Date   Allergy 20-25 years ago   Anemia    hx   Arthritis 8-10 years ago   Blood transfusion without reported diagnosis 20 years ago   Bronchitis    Bronchitis    hx   Closed patellar sleeve fracture of left knee    Diverticulosis    GERD (gastroesophageal reflux disease)    occ   Headache(784.0)    migraines occ   Hypertension    no meds in over 48yrs   Pneumonia    Seasonal allergies    Past Surgical History:  Procedure Laterality Date   ABDOMINAL HYSTERECTOMY     73 y/o   APPENDECTOMY  03/17/2013   Procedure: INCIDENTAL APPENDECTOMY;  Surgeon: Cherylynn Ridges, MD;  Location: [facility] OR;  Service: General;;   capsule endoscopy     COLON SURGERY     COLONOSCOPY     in 2012 -    COLOSTOMY CLOSURE  03/17/2013   COLOSTOMY REVERSAL  03/17/2013   Procedure: COLOSTOMY REVERSAL;  Surgeon: Cherylynn Ridges, MD;  Location: [facility] OR;  Service: General;;   EUS N/A 04/30/2015   Procedure: UPPER ENDOSCOPIC ULTRASOUND (EUS) LINEAR;  Surgeon: Jeani Hawking, MD;  Location: [facility];  Service: Endoscopy;  Laterality: N/A;   HERNIA REPAIR     X2 2-4 y/o inguinal   JOINT REPLACEMENT  ORID Knee/right hip   2021/2022   LAPAROTOMY N/A 10/17/2012   Procedure: EXPLORATORY LAPAROTOMY,  COLON RESECTION AND COLOSTOMY;  Surgeon: Cherylynn Ridges, MD;  Location: [facility] OR;  Service: General;  Laterality: N/A;   LIPOMA EXCISION Left    left arm   LYSIS OF ADHESION   03/17/2013   Procedure: LYSIS OF ADHESION;  Surgeon: Cherylynn Ridges, MD;  Location: [facility] OR;  Service: General;;   ORIF PATELLA Left 02/20/2020   Procedure: OPEN REDUCTION INTERNAL (ORIF) FIXATION LEFT PATELLA;  Surgeon: Tarry Kos, MD;  Location: [facility];  Service: Orthopedics;  Laterality: Left;   SMALL INTESTINE SURGERY  Perforated colon   2014   TOTAL HIP ARTHROPLASTY Right 09/12/2021   Procedure: RIGHT TOTAL HIP ARTHROPLASTY ANTERIOR APPROACH;  Surgeon: Tarry Kos, MD;  Location: [facility] OR;  Service: Orthopedics;  Laterality: Right;  3-C   TOTAL HIP ARTHROPLASTY Left 02/23/2023   Procedure: LEFT TOTAL HIP ARTHROPLASTY ANTERIOR APPROACH;  Surgeon: Tarry Kos, MD;  Location: [facility] OR;  Service: Orthopedics;  Laterality: Left;  3-C   UPPER GASTROINTESTINAL ENDOSCOPY     2013 -   Patient Active Problem List   Diagnosis Date Noted   Mild protein-calorie malnutrition (HCC) 06/05/2023   Anogenital candidiasis in female 06/04/2023   SBO (small bowel obstruction) (HCC) 05/08/2023   Status post total replacement of left hip 02/23/2023   History of colonic polyps 02/03/2022   Family history of  early CAD 07/02/2020   Lipoma of abdominal wall 08/04/2019   Osteopenia 01/28/2019   Thrombocytopenia (HCC) 10/17/2017   Vaginal atrophy 06/01/2015   Cyst of pancreas 03/06/2015   Glaucoma suspect of both eyes 03/11/2014   Nuclear sclerosis of both eyes 03/11/2014   Routine general medical examination at a health care facility 12/11/2012   Hyperglycemia 12/11/2012   Essential hypertension, benign 12/11/2012    PCP: Myrlene Broker, MD   REFERRING PROVIDER: Myrlene Broker, MD   REFERRING DIAG: [phone] (ICD-10-CM) - SBO (small bowel obstruction) (HCC)  THERAPY DIAG:  Muscle weakness (generalized)  Abnormal posture  Unspecified lack of coordination  Rationale for Evaluation and Treatment: Rehabilitation  ONSET DATE: chronic  SUBJECTIVE:                                                                                                                                                                                            SUBJECTIVE STATEMENT: Has had chronic abdominal problems. Had two hernias by age 17, partial hysterectomy at 73 yo, had perforated colon 11 years ago with ostomy that has sense been reversed, LT hip replacement 2024, had a small bowel obstruction 04/2023 did thin and soft food diet then had a second SBO 3 weeks later and continues to have soft diet and fearful of another SBO. Has lost a little weight from diet changes in the last couple weeks and per MD wanting her on it for 2 more weeks.   Fluid intake: 12oz water in am x2, boost, orange juice, 2 more glasses of water in afternoon, and one at dinner - is eating a lot of broths as well  PAIN:  Are you having pain? No   PRECAUTIONS: None  RED FLAGS: None   WEIGHT BEARING RESTRICTIONS: No  FALLS:  Has patient fallen in last 6 months? 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  ACTIVITY LEVEL : walking every day 5000-8000 steps daily; and active with friends   PLOF: Independent  PATIENT GOALS: to have more regular bowels  PERTINENT HISTORY:  HYSTERECTOMY, COLOSTOMY REVERSAL, HERNIA REPAIR x2 at 73 yo, Rt hip replacement Sexual abuse: No  BOWEL MOVEMENT: Pain with bowel movement: Yes sometimes Type of bowel movement:Type (Bristol Stool Scale) 1, Frequency usually once in the morning, and Strain yes  Fully empty rectum: No Leakage: No Pads: No Fiber supplement/laxative colace (2x daily)  URINATION: Pain with urination: Yes not often but sometimes pressure/tightness at abdomen Fully empty bladder: No Stream: Strong and Weak Urgency: No Frequency: no Leakage: Laughing Pads: No  INTERCOURSE:  Ability to have vaginal  penetration Yes  Pain with intercourse: Initial Penetration DrynessYes  Climax: hard to achieve  Marinoff Scale:  0/3  PREGNANCY: Vaginal deliveries 0 Tearing No Episiotomy No C-section deliveries 0 Currently pregnant No  PROLAPSE: None   OBJECTIVE:  Note: Objective measures were completed at Evaluation unless otherwise noted.  DIAGNOSTIC FINDINGS:    COGNITION: Overall cognitive status: Within functional limits for tasks assessed     SENSATION: Light touch: Appears intact  GAIT: WFL  POSTURE: rounded shoulders and posterior pelvic tilt   LUMBARAROM/PROM:  A/PROM A/PROM  eval  Flexion WFL  Extension WFL  Right lateral flexion Limited by 25%  Left lateral flexion Limited by 25%  Right rotation Limited by 25%  Left rotation Limited by 25%   (Blank rows = not tested)  LOWER EXTREMITY ROM:  WFL   LOWER EXTREMITY MMT:  WFL  PALPATION:   General: tightness in bil piriformis and lumbar paraspinals   Pelvic Alignment: WFL  Abdominal: several scars throughout abdominal quadrants with restrictions in all directions but no pain reports feeling tight                External Perineal Exam: no TTP                              Internal Pelvic Floor: no TTP, tightness throughout  Patient confirms identification and approves PT to assess internal pelvic floor and treatment Yes No emotional/communication barriers or cognitive limitation. Patient is motivated to learn. Patient understands and agrees with treatment goals and plan. PT explains patient will be examined in standing, sitting, and lying down to see how their muscles and joints work. When they are ready, they will be asked to remove their underwear so PT can examine their perineum. The patient is also given the option of providing their own chaperone as one is not provided in our facility. The patient also has the right and is explained the right to defer or refuse any part of the evaluation or treatment including the internal exam. With the patient's consent, PT will use one gloved finger to gently assess the muscles of the  pelvic floor, seeing how well it contracts and relaxes and if there is muscle symmetry. After, the patient will get dressed and PT and patient will discuss exam findings and plan of care. PT and patient discuss plan of care, schedule, attendance policy and HEP activities.   PELVIC MMT:   MMT eval  Vaginal   Internal Anal Sphincter 3/5  External Anal Sphincter 4/5, 3s (3/5 additional 20s), 7 reps  Puborectalis   Diastasis Recti   (Blank rows = not tested)        TONE: Slightly decreased   PROLAPSE: Not seen in rectal assessment   TODAY'S TREATMENT:  DATE:   06/21/23 EVAL Examination completed, findings reviewed, pt educated on POC, voiding mechanics. Pt motivated to participate in PT and agreeable to attempt recommendations.     PATIENT EDUCATION:  Education details: abdominal massage, voiding mechanics Person educated: Patient Education method: Explanation, Demonstration, Tactile cues, Verbal cues, and Handouts Education comprehension: verbalized understanding, returned demonstration, verbal cues required, tactile cues required, and needs further education  HOME EXERCISE PROGRAM: TBD  ASSESSMENT:  CLINICAL IMPRESSION: Patient is a 73 y.o. female  who was seen today for physical therapy evaluation and treatment for decreased bowel regulatory and type one stools, sometimes has a little pain with bowel movement but not often. Pt found to have decreased spinal and hip flexibility and areas of tension and restrictions in all abdominal quadrants. Did demonstrate decreased strength and endurance of posterior pelvic floor with rectal pelvic floor assessment. Pt tolerated session well, due to medical history of several bowel diagnoses would benefit from additional PT to address deficits.        OBJECTIVE IMPAIRMENTS: decreased activity tolerance, decreased  coordination, decreased endurance, decreased mobility, decreased strength, increased fascial restrictions, increased muscle spasms, impaired flexibility, postural dysfunction, and pain.   ACTIVITY LIMITATIONS: continence  PARTICIPATION LIMITATIONS: interpersonal relationship and community activity  PERSONAL FACTORS: Time since onset of injury/illness/exacerbation and 1 comorbidity: medical history  are also affecting patient's functional outcome.   REHAB POTENTIAL: Good  CLINICAL DECISION MAKING: Stable/uncomplicated  EVALUATION COMPLEXITY: Low   GOALS: Goals reviewed with patient? Yes  SHORT TERM GOALS: Target date: 07/19/23  Pt to be I with HEP.  Baseline: Goal status: INITIAL  2.  Pt to be I with abdominal massage for improved peristalsis for  bowel regularity.  Baseline:  Goal status: INITIAL  3.  Pt to be I with voiding mechanics for improved bowel emptying without straining to decreased stress at pelvic floor. Baseline:  Goal status: INITIAL   LONG TERM GOALS: Target date: 08/19/23  Pt to be I with advanced HEP.  Baseline:  Goal status: INITIAL  2.  Pt to demonstrate minimal to no restrictions in abdominal quadrants due to improved mobility and decreased tension at bowels.  Baseline:  Goal status: INITIAL  3.  Pt to demonstrate full range of mobility at trunk in all directions for decreased strain at pelvic floor.  Baseline:  Goal status: INITIAL  4.  Pt will report her BMs are complete due to improved bowel habits and evacuation techniques at least 75% of the time.  Baseline:  Goal status: INITIAL  5.  Pt to demonstrate at least 4/5 pelvic floor strength and isometric for at least 45s rectally for improved pelvic stability and decreased strain at pelvic floor.  Baseline:  Goal status: INITIAL   PLAN:  PT FREQUENCY: 1-2x/week  PT DURATION:  10 sessions  PLANNED INTERVENTIONS: 97110-Therapeutic exercises, 97530- Therapeutic activity, [id number]  Neuromuscular re-education, 97535- Self Care, and 40981- Manual therapy  PLAN FOR NEXT SESSION: internal if needed and pt consents, manual at abdomen, breathing and voiding mechanics, spine/hip/pelvic floor stretching  Otelia Sergeant, PT, DPT 06/20/2510:03 PM

## 2023-06-21 NOTE — Patient Instructions (Signed)
  Complete for 5 mins lying down first thing in the morning and evening in gentle circles with pressure downward. Shouldn't be painful.

## 2023-06-26 ENCOUNTER — Ambulatory Visit: Payer: Medicare Other | Attending: Internal Medicine | Admitting: Physical Therapy

## 2023-06-26 DIAGNOSIS — R293 Abnormal posture: Secondary | ICD-10-CM | POA: Diagnosis not present

## 2023-06-26 DIAGNOSIS — R279 Unspecified lack of coordination: Secondary | ICD-10-CM | POA: Diagnosis not present

## 2023-06-26 DIAGNOSIS — M6281 Muscle weakness (generalized): Secondary | ICD-10-CM | POA: Diagnosis not present

## 2023-06-26 NOTE — Therapy (Signed)
 OUTPATIENT PHYSICAL THERAPY FEMALE PELVIC TREATMENT   Patient Name: MODEAN MCCULLUM MRN: [id number] DOB:1950/12/09, 73 y.o., female Today's Date: 06/26/2023  END OF SESSION:  PT End of Session - 06/26/23 0936     Visit Number 2    Date for PT Re-Evaluation 08/19/23    Authorization Type MCR    PT Start Time 0933    PT Stop Time 1015    PT Time Calculation (min) 42 min    Activity Tolerance Patient tolerated treatment well    Behavior During Therapy WFL for tasks assessed/performed             Past Medical History:  Diagnosis Date   Allergy 20-25 years ago   Anemia    hx   Arthritis 8-10 years ago   Blood transfusion without reported diagnosis 20 years ago   Bronchitis    Bronchitis    hx   Closed patellar sleeve fracture of left knee    Diverticulosis    GERD (gastroesophageal reflux disease)    occ   Headache(784.0)    migraines occ   Hypertension    no meds in over 57yrs   Pneumonia    Seasonal allergies    Past Surgical History:  Procedure Laterality Date   ABDOMINAL HYSTERECTOMY     73 y/o   APPENDECTOMY  03/17/2013   Procedure: INCIDENTAL APPENDECTOMY;  Surgeon: Cherylynn Ridges, MD;  Location: [facility] OR;  Service: General;;   capsule endoscopy     COLON SURGERY     COLONOSCOPY     in 2012 -    COLOSTOMY CLOSURE  03/17/2013   COLOSTOMY REVERSAL  03/17/2013   Procedure: COLOSTOMY REVERSAL;  Surgeon: Cherylynn Ridges, MD;  Location: [facility] OR;  Service: General;;   EUS N/A 04/30/2015   Procedure: UPPER ENDOSCOPIC ULTRASOUND (EUS) LINEAR;  Surgeon: Jeani Hawking, MD;  Location: [facility];  Service: Endoscopy;  Laterality: N/A;   HERNIA REPAIR     X2 2-4 y/o inguinal   JOINT REPLACEMENT  ORID Knee/right hip   2021/2022   LAPAROTOMY N/A 10/17/2012   Procedure: EXPLORATORY LAPAROTOMY,  COLON RESECTION AND COLOSTOMY;  Surgeon: Cherylynn Ridges, MD;  Location: [facility] OR;  Service: General;  Laterality: N/A;   LIPOMA EXCISION Left    left arm   LYSIS OF ADHESION  03/17/2013    Procedure: LYSIS OF ADHESION;  Surgeon: Cherylynn Ridges, MD;  Location: [facility] OR;  Service: General;;   ORIF PATELLA Left 02/20/2020   Procedure: OPEN REDUCTION INTERNAL (ORIF) FIXATION LEFT PATELLA;  Surgeon: Tarry Kos, MD;  Location: [facility];  Service: Orthopedics;  Laterality: Left;   SMALL INTESTINE SURGERY  Perforated colon   2014   TOTAL HIP ARTHROPLASTY Right 09/12/2021   Procedure: RIGHT TOTAL HIP ARTHROPLASTY ANTERIOR APPROACH;  Surgeon: Tarry Kos, MD;  Location: [facility] OR;  Service: Orthopedics;  Laterality: Right;  3-C   TOTAL HIP ARTHROPLASTY Left 02/23/2023   Procedure: LEFT TOTAL HIP ARTHROPLASTY ANTERIOR APPROACH;  Surgeon: Tarry Kos, MD;  Location: [facility] OR;  Service: Orthopedics;  Laterality: Left;  3-C   UPPER GASTROINTESTINAL ENDOSCOPY     2013 -   Patient Active Problem List   Diagnosis Date Noted   Mild protein-calorie malnutrition (HCC) 06/05/2023   Anogenital candidiasis in female 06/04/2023   SBO (small bowel obstruction) (HCC) 05/08/2023   Status post total replacement of left hip 02/23/2023   History of colonic polyps 02/03/2022   Family history of  early CAD 07/02/2020   Lipoma of abdominal wall 08/04/2019   Osteopenia 01/28/2019   Thrombocytopenia (HCC) 10/17/2017   Vaginal atrophy 06/01/2015   Cyst of pancreas 03/06/2015   Glaucoma suspect of both eyes 03/11/2014   Nuclear sclerosis of both eyes 03/11/2014   Routine general medical examination at a health care facility 12/11/2012   Hyperglycemia 12/11/2012   Essential hypertension, benign 12/11/2012    PCP: Myrlene Broker, MD   REFERRING PROVIDER: Myrlene Broker, MD   REFERRING DIAG: [phone] (ICD-10-CM) - SBO (small bowel obstruction) (HCC)  THERAPY DIAG:  Muscle weakness (generalized)  Abnormal posture  Unspecified lack of coordination  Rationale for Evaluation and Treatment: Rehabilitation  ONSET DATE: chronic  SUBJECTIVE:                                                                                                                                                                                            SUBJECTIVE STATEMENT: Has been doing abdominal massage daily and this has been helping. Had a bowel movement this morning without straining but still having type 1-2 stools   Fluid intake: 12oz water in am x2, boost, orange juice, 2 more glasses of water in afternoon, and one at dinner - is eating a lot of broths as well  PAIN:  Are you having pain? No   PRECAUTIONS: None  RED FLAGS: None   WEIGHT BEARING RESTRICTIONS: No  FALLS:  Has patient fallen in last 6 months? 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  ACTIVITY LEVEL : walking every day 5000-8000 steps daily; and active with friends   PLOF: Independent  PATIENT GOALS: to have more regular bowels  PERTINENT HISTORY:  HYSTERECTOMY, COLOSTOMY REVERSAL, HERNIA REPAIR x2 at 73 yo, Rt hip replacement Sexual abuse: No  BOWEL MOVEMENT: Pain with bowel movement: Yes sometimes Type of bowel movement:Type (Bristol Stool Scale) 1, Frequency usually once in the morning, and Strain yes  Fully empty rectum: No Leakage: No Pads: No Fiber supplement/laxative colace (2x daily)  URINATION: Pain with urination: Yes not often but sometimes pressure/tightness at abdomen Fully empty bladder: No Stream: Strong and Weak Urgency: No Frequency: no Leakage: Laughing Pads: No  INTERCOURSE:  Ability to have vaginal penetration Yes  Pain with intercourse: Initial Penetration DrynessYes  Climax: hard to achieve  Marinoff Scale: 0/3  PREGNANCY: Vaginal deliveries 0 Tearing No Episiotomy No C-section deliveries 0 Currently pregnant No  PROLAPSE: None   OBJECTIVE:  Note: Objective measures were completed at Evaluation unless otherwise noted.  DIAGNOSTIC FINDINGS:    COGNITION: Overall cognitive status: Within functional limits  for  tasks assessed     SENSATION: Light touch: Appears intact  GAIT: WFL  POSTURE: rounded shoulders and posterior pelvic tilt   LUMBARAROM/PROM:  A/PROM A/PROM  eval  Flexion WFL  Extension WFL  Right lateral flexion Limited by 25%  Left lateral flexion Limited by 25%  Right rotation Limited by 25%  Left rotation Limited by 25%   (Blank rows = not tested)  LOWER EXTREMITY ROM:  WFL   LOWER EXTREMITY MMT:  WFL  PALPATION:   General: tightness in bil piriformis and lumbar paraspinals   Pelvic Alignment: WFL  Abdominal: several scars throughout abdominal quadrants with restrictions in all directions but no pain reports feeling tight                External Perineal Exam: no TTP                              Internal Pelvic Floor: no TTP, tightness throughout  Patient confirms identification and approves PT to assess internal pelvic floor and treatment Yes No emotional/communication barriers or cognitive limitation. Patient is motivated to learn. Patient understands and agrees with treatment goals and plan. PT explains patient will be examined in standing, sitting, and lying down to see how their muscles and joints work. When they are ready, they will be asked to remove their underwear so PT can examine their perineum. The patient is also given the option of providing their own chaperone as one is not provided in our facility. The patient also has the right and is explained the right to defer or refuse any part of the evaluation or treatment including the internal exam. With the patient's consent, PT will use one gloved finger to gently assess the muscles of the pelvic floor, seeing how well it contracts and relaxes and if there is muscle symmetry. After, the patient will get dressed and PT and patient will discuss exam findings and plan of care. PT and patient discuss plan of care, schedule, attendance policy and HEP activities.   PELVIC MMT:   MMT eval  Vaginal   Internal  Anal Sphincter 3/5  External Anal Sphincter 4/5, 3s (3/5 additional 20s), 7 reps  Puborectalis   Diastasis Recti   (Blank rows = not tested)        TONE: Slightly decreased   PROLAPSE: Not seen in rectal assessment   TODAY'S TREATMENT:                                                                                                                              DATE:   06/21/23 EVAL Examination completed, findings reviewed, pt educated on POC, voiding mechanics. Pt motivated to participate in PT and agreeable to attempt recommendations.    06/26/23  Abdominal manual fascial direct release techniques and scar massage within pt tolerance, she denied pain throughout. Tension noted throughout but worse around stoma scarring  and cystectomy meeting mid abdominal scar at midline however manual completed throughout for improved mobility for bowels and improved bowel habits.  X10 open books bil 2x10 diaphragmatic breathing with foam roller vertical back  PATIENT EDUCATION:  Education details: abdominal massage, voiding mechanics Person educated: Patient Education method: Explanation, Demonstration, Tactile cues, Verbal cues, and Handouts Education comprehension: verbalized understanding, returned demonstration, verbal cues required, tactile cues required, and needs further education  HOME EXERCISE PROGRAM: TBD  ASSESSMENT:  CLINICAL IMPRESSION: Patient is a 73 y.o. female  who was seen today for physical therapy evaluation and treatment for decreased bowel regulatory and type one stools, sometimes has a little pain with bowel movement but not often. Pt tolerated session well denied pain throughout and reports she could feel great improvement with no longer having tension in abdomen and "feeling looser". Due to medical history of several bowel diagnoses would benefit from additional PT to address deficits.        OBJECTIVE IMPAIRMENTS: decreased activity tolerance, decreased coordination,  decreased endurance, decreased mobility, decreased strength, increased fascial restrictions, increased muscle spasms, impaired flexibility, postural dysfunction, and pain.   ACTIVITY LIMITATIONS: continence  PARTICIPATION LIMITATIONS: interpersonal relationship and community activity  PERSONAL FACTORS: Time since onset of injury/illness/exacerbation and 1 comorbidity: medical history  are also affecting patient's functional outcome.   REHAB POTENTIAL: Good  CLINICAL DECISION MAKING: Stable/uncomplicated  EVALUATION COMPLEXITY: Low   GOALS: Goals reviewed with patient? Yes  SHORT TERM GOALS: Target date: 07/19/23  Pt to be I with HEP.  Baseline: Goal status: INITIAL  2.  Pt to be I with abdominal massage for improved peristalsis for  bowel regularity.  Baseline:  Goal status: INITIAL  3.  Pt to be I with voiding mechanics for improved bowel emptying without straining to decreased stress at pelvic floor. Baseline:  Goal status: INITIAL   LONG TERM GOALS: Target date: 08/19/23  Pt to be I with advanced HEP.  Baseline:  Goal status: INITIAL  2.  Pt to demonstrate minimal to no restrictions in abdominal quadrants due to improved mobility and decreased tension at bowels.  Baseline:  Goal status: INITIAL  3.  Pt to demonstrate full range of mobility at trunk in all directions for decreased strain at pelvic floor.  Baseline:  Goal status: INITIAL  4.  Pt will report her BMs are complete due to improved bowel habits and evacuation techniques at least 75% of the time.  Baseline:  Goal status: INITIAL  5.  Pt to demonstrate at least 4/5 pelvic floor strength and isometric for at least 45s rectally for improved pelvic stability and decreased strain at pelvic floor.  Baseline:  Goal status: INITIAL   PLAN:  PT FREQUENCY: 1-2x/week  PT DURATION:  10 sessions  PLANNED INTERVENTIONS: 97110-Therapeutic exercises, 97530- Therapeutic activity, [id number]- Neuromuscular  re-education, 97535- Self Care, and 16109- Manual therapy  PLAN FOR NEXT SESSION: internal if needed and pt consents, manual at abdomen, breathing and voiding mechanics, spine/hip/pelvic floor stretching  Otelia Sergeant, PT, DPT 06/25/2508:24 AM

## 2023-07-02 ENCOUNTER — Ambulatory Visit: Payer: Self-pay | Admitting: Physical Therapy

## 2023-07-02 DIAGNOSIS — R279 Unspecified lack of coordination: Secondary | ICD-10-CM

## 2023-07-02 DIAGNOSIS — R293 Abnormal posture: Secondary | ICD-10-CM | POA: Diagnosis not present

## 2023-07-02 DIAGNOSIS — M6281 Muscle weakness (generalized): Secondary | ICD-10-CM | POA: Diagnosis not present

## 2023-07-02 NOTE — Therapy (Signed)
 OUTPATIENT PHYSICAL THERAPY FEMALE PELVIC TREATMENT   Patient Name: Alyssa Robinson MRN: [id number] DOB:1951-03-07, 73 y.o., female Today's Date: 07/02/2023  END OF SESSION:  PT End of Session - 07/02/23 1618     Visit Number 3    Date for PT Re-Evaluation 08/19/23    Authorization Type MCR    PT Start Time 1615    PT Stop Time 1655    PT Time Calculation (min) 40 min    Activity Tolerance Patient tolerated treatment well    Behavior During Therapy WFL for tasks assessed/performed             Past Medical History:  Diagnosis Date   Allergy 20-25 years ago   Anemia    hx   Arthritis 8-10 years ago   Blood transfusion without reported diagnosis 20 years ago   Bronchitis    Bronchitis    hx   Closed patellar sleeve fracture of left knee    Diverticulosis    GERD (gastroesophageal reflux disease)    occ   Headache(784.0)    migraines occ   Hypertension    no meds in over 63yrs   Pneumonia    Seasonal allergies    Past Surgical History:  Procedure Laterality Date   ABDOMINAL HYSTERECTOMY     73 y/o   APPENDECTOMY  03/17/2013   Procedure: INCIDENTAL APPENDECTOMY;  Surgeon: Cherylynn Ridges, MD;  Location: [facility] OR;  Service: General;;   capsule endoscopy     COLON SURGERY     COLONOSCOPY     in 2012 -    COLOSTOMY CLOSURE  03/17/2013   COLOSTOMY REVERSAL  03/17/2013   Procedure: COLOSTOMY REVERSAL;  Surgeon: Cherylynn Ridges, MD;  Location: [facility] OR;  Service: General;;   EUS N/A 04/30/2015   Procedure: UPPER ENDOSCOPIC ULTRASOUND (EUS) LINEAR;  Surgeon: Jeani Hawking, MD;  Location: [facility];  Service: Endoscopy;  Laterality: N/A;   HERNIA REPAIR     X2 2-4 y/o inguinal   JOINT REPLACEMENT  ORID Knee/right hip   2021/2022   LAPAROTOMY N/A 10/17/2012   Procedure: EXPLORATORY LAPAROTOMY,  COLON RESECTION AND COLOSTOMY;  Surgeon: Cherylynn Ridges, MD;  Location: [facility] OR;  Service: General;  Laterality: N/A;   LIPOMA EXCISION Left    left arm   LYSIS OF ADHESION   03/17/2013   Procedure: LYSIS OF ADHESION;  Surgeon: Cherylynn Ridges, MD;  Location: [facility] OR;  Service: General;;   ORIF PATELLA Left 02/20/2020   Procedure: OPEN REDUCTION INTERNAL (ORIF) FIXATION LEFT PATELLA;  Surgeon: Tarry Kos, MD;  Location: [facility];  Service: Orthopedics;  Laterality: Left;   SMALL INTESTINE SURGERY  Perforated colon   2014   TOTAL HIP ARTHROPLASTY Right 09/12/2021   Procedure: RIGHT TOTAL HIP ARTHROPLASTY ANTERIOR APPROACH;  Surgeon: Tarry Kos, MD;  Location: [facility] OR;  Service: Orthopedics;  Laterality: Right;  3-C   TOTAL HIP ARTHROPLASTY Left 02/23/2023   Procedure: LEFT TOTAL HIP ARTHROPLASTY ANTERIOR APPROACH;  Surgeon: Tarry Kos, MD;  Location: [facility] OR;  Service: Orthopedics;  Laterality: Left;  3-C   UPPER GASTROINTESTINAL ENDOSCOPY     2013 -   Patient Active Problem List   Diagnosis Date Noted   Mild protein-calorie malnutrition (HCC) 06/05/2023   Anogenital candidiasis in female 06/04/2023   SBO (small bowel obstruction) (HCC) 05/08/2023   Status post total replacement of left hip 02/23/2023   History of colonic polyps 02/03/2022   Family history of  early CAD 07/02/2020   Lipoma of abdominal wall 08/04/2019   Osteopenia 01/28/2019   Thrombocytopenia (HCC) 10/17/2017   Vaginal atrophy 06/01/2015   Cyst of pancreas 03/06/2015   Glaucoma suspect of both eyes 03/11/2014   Nuclear sclerosis of both eyes 03/11/2014   Routine general medical examination at a health care facility 12/11/2012   Hyperglycemia 12/11/2012   Essential hypertension, benign 12/11/2012    PCP: Myrlene Broker, MD   REFERRING PROVIDER: Myrlene Broker, MD   REFERRING DIAG: [phone] (ICD-10-CM) - SBO (small bowel obstruction) (HCC)  THERAPY DIAG:  Muscle weakness (generalized)  Abnormal posture  Unspecified lack of coordination  Rationale for Evaluation and Treatment: Rehabilitation  ONSET DATE: chronic  SUBJECTIVE:                                                                                                                                                                                            SUBJECTIVE STATEMENT: Pt reports she is still having type 1-2 stools and 1-3x daily.   Fluid intake: 12oz water in am x2, boost, orange juice, 2 more glasses of water in afternoon, and one at dinner - is eating a lot of broths as well  PAIN:  Are you having pain? No   PRECAUTIONS: None  RED FLAGS: None   WEIGHT BEARING RESTRICTIONS: No  FALLS:  Has patient fallen in last 6 months? 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  ACTIVITY LEVEL : walking every day 5000-8000 steps daily; and active with friends   PLOF: Independent  PATIENT GOALS: to have more regular bowels  PERTINENT HISTORY:  HYSTERECTOMY, COLOSTOMY REVERSAL, HERNIA REPAIR x2 at 73 yo, Rt hip replacement Sexual abuse: No  BOWEL MOVEMENT: Pain with bowel movement: Yes sometimes Type of bowel movement:Type (Bristol Stool Scale) 1, Frequency usually once in the morning, and Strain yes  Fully empty rectum: No Leakage: No Pads: No Fiber supplement/laxative colace (2x daily)  URINATION: Pain with urination: Yes not often but sometimes pressure/tightness at abdomen Fully empty bladder: No Stream: Strong and Weak Urgency: No Frequency: no Leakage: Laughing Pads: No  INTERCOURSE:  Ability to have vaginal penetration Yes  Pain with intercourse: Initial Penetration DrynessYes  Climax: hard to achieve  Marinoff Scale: 0/3  PREGNANCY: Vaginal deliveries 0 Tearing No Episiotomy No C-section deliveries 0 Currently pregnant No  PROLAPSE: None   OBJECTIVE:  Note: Objective measures were completed at Evaluation unless otherwise noted.  DIAGNOSTIC FINDINGS:    COGNITION: Overall cognitive status: Within functional limits for tasks assessed     SENSATION: Light touch: Appears  intact  GAIT: WFL  POSTURE: rounded shoulders and posterior pelvic tilt   LUMBARAROM/PROM:  A/PROM A/PROM  eval  Flexion WFL  Extension WFL  Right lateral flexion Limited by 25%  Left lateral flexion Limited by 25%  Right rotation Limited by 25%  Left rotation Limited by 25%   (Blank rows = not tested)  LOWER EXTREMITY ROM:  WFL   LOWER EXTREMITY MMT:  WFL  PALPATION:   General: tightness in bil piriformis and lumbar paraspinals   Pelvic Alignment: WFL  Abdominal: several scars throughout abdominal quadrants with restrictions in all directions but no pain reports feeling tight                External Perineal Exam: no TTP                              Internal Pelvic Floor: no TTP, tightness throughout  Patient confirms identification and approves PT to assess internal pelvic floor and treatment Yes No emotional/communication barriers or cognitive limitation. Patient is motivated to learn. Patient understands and agrees with treatment goals and plan. PT explains patient will be examined in standing, sitting, and lying down to see how their muscles and joints work. When they are ready, they will be asked to remove their underwear so PT can examine their perineum. The patient is also given the option of providing their own chaperone as one is not provided in our facility. The patient also has the right and is explained the right to defer or refuse any part of the evaluation or treatment including the internal exam. With the patient's consent, PT will use one gloved finger to gently assess the muscles of the pelvic floor, seeing how well it contracts and relaxes and if there is muscle symmetry. After, the patient will get dressed and PT and patient will discuss exam findings and plan of care. PT and patient discuss plan of care, schedule, attendance policy and HEP activities.   PELVIC MMT:   MMT eval  Vaginal   Internal Anal Sphincter 3/5  External Anal Sphincter 4/5, 3s  (3/5 additional 20s), 7 reps  Puborectalis   Diastasis Recti   (Blank rows = not tested)        TONE: Slightly decreased   PROLAPSE: Not seen in rectal assessment   TODAY'S TREATMENT:                                                                                                                              DATE:   06/21/23 EVAL Examination completed, findings reviewed, pt educated on POC, voiding mechanics. Pt motivated to participate in PT and agreeable to attempt recommendations.    06/26/23  Abdominal manual fascial direct release techniques and scar massage within pt tolerance, she denied pain throughout. Tension noted throughout but worse around stoma scarring and cystectomy meeting mid abdominal scar at midline however manual completed throughout for improved  mobility for bowels and improved bowel habits.  X10 open books bil 2x10 diaphragmatic breathing with foam roller vertical back  07/02/23: Patient consented to internal pelvic floor assessment rectally this date and found to have continued tension at superficial and deep pelvic floor layers but not painful. Pt reports she does feel the tightness though. Manual completed bil with good effect with gentle stretching tolerated well.  Childs pose 3x30s with pillow block X10 cat/cow 2x30s happy baby in sitting   PATIENT EDUCATION:  Education details: abdominal massage, voiding mechanics, [id number] Person educated: Patient Education method: Explanation, Demonstration, Tactile cues, Verbal cues, and Handouts Education comprehension: verbalized understanding, returned demonstration, verbal cues required, tactile cues required, and needs further education  HOME EXERCISE PROGRAM: [id number]  ASSESSMENT:  CLINICAL IMPRESSION: Patient is a 73 y.o. female  who was seen today for physical therapy evaluation and treatment for decreased bowel regulatory and type one stools, sometimes has a little pain with bowel movement but not often.  Pt tolerated session well denied pain throughout and reports feeling less tight at rectal area and benefited from cues for techniques for relaxation and mechanics for stretching. Hep given and reviewed today. Due to medical history of several bowel diagnoses would benefit from additional PT to address deficits.        OBJECTIVE IMPAIRMENTS: decreased activity tolerance, decreased coordination, decreased endurance, decreased mobility, decreased strength, increased fascial restrictions, increased muscle spasms, impaired flexibility, postural dysfunction, and pain.   ACTIVITY LIMITATIONS: continence  PARTICIPATION LIMITATIONS: interpersonal relationship and community activity  PERSONAL FACTORS: Time since onset of injury/illness/exacerbation and 1 comorbidity: medical history  are also affecting patient's functional outcome.   REHAB POTENTIAL: Good  CLINICAL DECISION MAKING: Stable/uncomplicated  EVALUATION COMPLEXITY: Low   GOALS: Goals reviewed with patient? Yes  SHORT TERM GOALS: Target date: 07/19/23  Pt to be I with HEP.  Baseline: Goal status: INITIAL  2.  Pt to be I with abdominal massage for improved peristalsis for  bowel regularity.  Baseline:  Goal status: INITIAL  3.  Pt to be I with voiding mechanics for improved bowel emptying without straining to decreased stress at pelvic floor. Baseline:  Goal status: INITIAL   LONG TERM GOALS: Target date: 08/19/23  Pt to be I with advanced HEP.  Baseline:  Goal status: INITIAL  2.  Pt to demonstrate minimal to no restrictions in abdominal quadrants due to improved mobility and decreased tension at bowels.  Baseline:  Goal status: INITIAL  3.  Pt to demonstrate full range of mobility at trunk in all directions for decreased strain at pelvic floor.  Baseline:  Goal status: INITIAL  4.  Pt will report her BMs are complete due to improved bowel habits and evacuation techniques at least 75% of the time.  Baseline:  Goal  status: INITIAL  5.  Pt to demonstrate at least 4/5 pelvic floor strength and isometric for at least 45s rectally for improved pelvic stability and decreased strain at pelvic floor.  Baseline:  Goal status: INITIAL   PLAN:  PT FREQUENCY: 1-2x/week  PT DURATION:  10 sessions  PLANNED INTERVENTIONS: 97110-Therapeutic exercises, 97530- Therapeutic activity, [id number]- Neuromuscular re-education, 97535- Self Care, and 16109- Manual therapy  PLAN FOR NEXT SESSION: internal if needed and pt consents, manual at abdomen, breathing and voiding mechanics, spine/hip/pelvic floor stretching  Otelia Sergeant, PT, DPT 03/10/255:04 PM

## 2023-07-05 DIAGNOSIS — R634 Abnormal weight loss: Secondary | ICD-10-CM | POA: Diagnosis not present

## 2023-07-05 DIAGNOSIS — K862 Cyst of pancreas: Secondary | ICD-10-CM | POA: Diagnosis not present

## 2023-07-05 DIAGNOSIS — K5904 Chronic idiopathic constipation: Secondary | ICD-10-CM | POA: Diagnosis not present

## 2023-07-05 DIAGNOSIS — Z860100 Personal history of colon polyps, unspecified: Secondary | ICD-10-CM | POA: Diagnosis not present

## 2023-07-05 DIAGNOSIS — K625 Hemorrhage of anus and rectum: Secondary | ICD-10-CM | POA: Diagnosis not present

## 2023-07-09 ENCOUNTER — Ambulatory Visit: Payer: Self-pay | Admitting: Physical Therapy

## 2023-07-09 DIAGNOSIS — R293 Abnormal posture: Secondary | ICD-10-CM

## 2023-07-09 DIAGNOSIS — M6281 Muscle weakness (generalized): Secondary | ICD-10-CM

## 2023-07-09 DIAGNOSIS — R279 Unspecified lack of coordination: Secondary | ICD-10-CM | POA: Diagnosis not present

## 2023-07-09 NOTE — Therapy (Signed)
 OUTPATIENT PHYSICAL THERAPY FEMALE PELVIC TREATMENT   Patient Name: Alyssa Robinson MRN: [id number] DOB:[birth year], 73 y.o., female Today's Date: 07/09/2023  END OF SESSION:  PT End of Session - 07/09/23 1237     Visit Number 4    Date for PT Re-Evaluation 08/19/23    Authorization Type MCR    PT Start Time 1233    PT Stop Time 1312    PT Time Calculation (min) 39 min    Activity Tolerance Patient tolerated treatment well    Behavior During Therapy WFL for tasks assessed/performed             Past Medical History:  Diagnosis Date   Allergy 20-25 years ago   Anemia    hx   Arthritis 8-10 years ago   Blood transfusion without reported diagnosis 20 years ago   Bronchitis    Bronchitis    hx   Closed patellar sleeve fracture of left knee    Diverticulosis    GERD (gastroesophageal reflux disease)    occ   Headache(784.0)    migraines occ   Hypertension    no meds in over 15yrs   Pneumonia    Seasonal allergies    Past Surgical History:  Procedure Laterality Date   ABDOMINAL HYSTERECTOMY     73 y/o   APPENDECTOMY  03/17/2013   Procedure: INCIDENTAL APPENDECTOMY;  Surgeon: Cherylynn Ridges, MD;  Location: [facility] OR;  Service: General;;   capsule endoscopy     COLON SURGERY     COLONOSCOPY     in 2012 -    COLOSTOMY CLOSURE  03/17/2013   COLOSTOMY REVERSAL  03/17/2013   Procedure: COLOSTOMY REVERSAL;  Surgeon: Cherylynn Ridges, MD;  Location: [facility] OR;  Service: General;;   EUS N/A 04/30/2015   Procedure: UPPER ENDOSCOPIC ULTRASOUND (EUS) LINEAR;  Surgeon: Jeani Hawking, MD;  Location: [facility];  Service: Endoscopy;  Laterality: N/A;   HERNIA REPAIR     X2 2-4 y/o inguinal   JOINT REPLACEMENT  ORID Knee/right hip   2021/2022   LAPAROTOMY N/A 10/17/2012   Procedure: EXPLORATORY LAPAROTOMY,  COLON RESECTION AND COLOSTOMY;  Surgeon: Cherylynn Ridges, MD;  Location: [facility] OR;  Service: General;  Laterality: N/A;   LIPOMA EXCISION Left    left arm   LYSIS OF ADHESION   03/17/2013   Procedure: LYSIS OF ADHESION;  Surgeon: Cherylynn Ridges, MD;  Location: [facility] OR;  Service: General;;   ORIF PATELLA Left 02/20/2020   Procedure: OPEN REDUCTION INTERNAL (ORIF) FIXATION LEFT PATELLA;  Surgeon: Tarry Kos, MD;  Location: [facility];  Service: Orthopedics;  Laterality: Left;   SMALL INTESTINE SURGERY  Perforated colon   2014   TOTAL HIP ARTHROPLASTY Right 09/12/2021   Procedure: RIGHT TOTAL HIP ARTHROPLASTY ANTERIOR APPROACH;  Surgeon: Tarry Kos, MD;  Location: [facility] OR;  Service: Orthopedics;  Laterality: Right;  3-C   TOTAL HIP ARTHROPLASTY Left 02/23/2023   Procedure: LEFT TOTAL HIP ARTHROPLASTY ANTERIOR APPROACH;  Surgeon: Tarry Kos, MD;  Location: [facility] OR;  Service: Orthopedics;  Laterality: Left;  3-C   UPPER GASTROINTESTINAL ENDOSCOPY     2013 -   Patient Active Problem List   Diagnosis Date Noted   Mild protein-calorie malnutrition (HCC) 06/05/2023   Anogenital candidiasis in female 06/04/2023   SBO (small bowel obstruction) (HCC) 05/08/2023   Status post total replacement of left hip 02/23/2023   History of colonic polyps 02/03/2022   Family history of  early CAD 07/02/2020   Lipoma of abdominal wall 08/04/2019   Osteopenia 01/28/2019   Thrombocytopenia (HCC) 10/17/2017   Vaginal atrophy 06/01/2015   Cyst of pancreas 03/06/2015   Glaucoma suspect of both eyes 03/11/2014   Nuclear sclerosis of both eyes 03/11/2014   Routine general medical examination at a health care facility 12/11/2012   Hyperglycemia 12/11/2012   Essential hypertension, benign 12/11/2012    PCP: Myrlene Broker, MD   REFERRING PROVIDER: Myrlene Broker, MD   REFERRING DIAG: [phone] (ICD-10-CM) - SBO (small bowel obstruction) (HCC)  THERAPY DIAG:  Muscle weakness (generalized)  Abnormal posture  Unspecified lack of coordination  Rationale for Evaluation and Treatment: Rehabilitation  ONSET DATE: chronic  SUBJECTIVE:                                                                                                                                                                                            SUBJECTIVE STATEMENT: Pt reports she saw GI and able to start soluble fiber foods and she recommended pt to start increasing colace and use of miralax.   Fluid intake: 12oz water in am x2, boost, orange juice, 2 more glasses of water in afternoon, and one at dinner - is eating a lot of broths as well  PAIN:  Are you having pain? No   PRECAUTIONS: None  RED FLAGS: None   WEIGHT BEARING RESTRICTIONS: No  FALLS:  Has patient fallen in last 6 months? 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  ACTIVITY LEVEL : walking every day 5000-8000 steps daily; and active with friends   PLOF: Independent  PATIENT GOALS: to have more regular bowels  PERTINENT HISTORY:  HYSTERECTOMY, COLOSTOMY REVERSAL, HERNIA REPAIR x2 at 73 yo, Rt hip replacement Sexual abuse: No  BOWEL MOVEMENT: Pain with bowel movement: Yes sometimes Type of bowel movement:Type (Bristol Stool Scale) 1, Frequency usually once in the morning, and Strain yes  Fully empty rectum: No Leakage: No Pads: No Fiber supplement/laxative colace (2x daily)  URINATION: Pain with urination: Yes not often but sometimes pressure/tightness at abdomen Fully empty bladder: No Stream: Strong and Weak Urgency: No Frequency: no Leakage: Laughing Pads: No  INTERCOURSE:  Ability to have vaginal penetration Yes  Pain with intercourse: Initial Penetration DrynessYes  Climax: hard to achieve  Marinoff Scale: 0/3  PREGNANCY: Vaginal deliveries 0 Tearing No Episiotomy No C-section deliveries 0 Currently pregnant No  PROLAPSE: None   OBJECTIVE:  Note: Objective measures were completed at Evaluation unless otherwise noted.  DIAGNOSTIC FINDINGS:    COGNITION: Overall cognitive status: Within functional limits for  tasks  assessed     SENSATION: Light touch: Appears intact  GAIT: WFL  POSTURE: rounded shoulders and posterior pelvic tilt   LUMBARAROM/PROM:  A/PROM A/PROM  eval  Flexion WFL  Extension WFL  Right lateral flexion Limited by 25%  Left lateral flexion Limited by 25%  Right rotation Limited by 25%  Left rotation Limited by 25%   (Blank rows = not tested)  LOWER EXTREMITY ROM:  WFL   LOWER EXTREMITY MMT:  WFL  PALPATION:   General: tightness in bil piriformis and lumbar paraspinals   Pelvic Alignment: WFL  Abdominal: several scars throughout abdominal quadrants with restrictions in all directions but no pain reports feeling tight                External Perineal Exam: no TTP                              Internal Pelvic Floor: no TTP, tightness throughout  Patient confirms identification and approves PT to assess internal pelvic floor and treatment Yes No emotional/communication barriers or cognitive limitation. Patient is motivated to learn. Patient understands and agrees with treatment goals and plan. PT explains patient will be examined in standing, sitting, and lying down to see how their muscles and joints work. When they are ready, they will be asked to remove their underwear so PT can examine their perineum. The patient is also given the option of providing their own chaperone as one is not provided in our facility. The patient also has the right and is explained the right to defer or refuse any part of the evaluation or treatment including the internal exam. With the patient's consent, PT will use one gloved finger to gently assess the muscles of the pelvic floor, seeing how well it contracts and relaxes and if there is muscle symmetry. After, the patient will get dressed and PT and patient will discuss exam findings and plan of care. PT and patient discuss plan of care, schedule, attendance policy and HEP activities.   PELVIC MMT:   MMT eval  Vaginal   Internal Anal  Sphincter 3/5  External Anal Sphincter 4/5, 3s (3/5 additional 20s), 7 reps  Puborectalis   Diastasis Recti   (Blank rows = not tested)        TONE: Slightly decreased   PROLAPSE: Not seen in rectal assessment   TODAY'S TREATMENT:                                                                                                                              DATE:   06/26/23  Abdominal manual fascial direct release techniques and scar massage within pt tolerance, she denied pain throughout. Tension noted throughout but worse around stoma scarring and cystectomy meeting mid abdominal scar at midline however manual completed throughout for improved mobility for bowels and improved bowel habits.  X10 open books bil 2x10  diaphragmatic breathing with foam roller vertical back  07/02/23: Patient consented to internal pelvic floor assessment rectally this date and found to have continued tension at superficial and deep pelvic floor layers but not painful. Pt reports she does feel the tightness though. Manual completed bil with good effect with gentle stretching tolerated well.  Marjo Bicker pose 3x30s with pillow block X10 cat/cow 2x30s happy baby in sitting   07/09/23: Patient consented to internal pelvic floor treatment rectally this date and found to have continued tension at superficial and deep pelvic floor layers but not painful. Pt reports she does feel the tightness.. Manual completed bil with good effect with gentle stretching tolerated well.  Pt reports feeling more relaxed afterward. Pt had questions regarding GI appointment recently with now able to eat soluble foods, pt did receive handout from GI discussing this and PT encouraged pt to follow those recommendations as MD set them. Pt agreed.   PATIENT EDUCATION:  Education details: abdominal massage, voiding mechanics, [id number] Person educated: Patient Education method: Explanation, Demonstration, Tactile cues, Verbal cues, and  Handouts Education comprehension: verbalized understanding, returned demonstration, verbal cues required, tactile cues required, and needs further education  HOME EXERCISE PROGRAM: [id number]  ASSESSMENT:  CLINICAL IMPRESSION: Patient is a 73 y.o. female  who was seen today for physical therapy evaluation and treatment for decreased bowel regulatory and type one stools, sometimes has a little pain with bowel movement but not often. Pt tolerated session well denied pain throughout and reports feeling less tight at rectal area and benefited from cues for techniques for relaxation and mechanics for stretching. Hep given and reviewed today. Due to medical history of several bowel diagnoses would benefit from additional PT to address deficits.        OBJECTIVE IMPAIRMENTS: decreased activity tolerance, decreased coordination, decreased endurance, decreased mobility, decreased strength, increased fascial restrictions, increased muscle spasms, impaired flexibility, postural dysfunction, and pain.   ACTIVITY LIMITATIONS: continence  PARTICIPATION LIMITATIONS: interpersonal relationship and community activity  PERSONAL FACTORS: Time since onset of injury/illness/exacerbation and 1 comorbidity: medical history  are also affecting patient's functional outcome.   REHAB POTENTIAL: Good  CLINICAL DECISION MAKING: Stable/uncomplicated  EVALUATION COMPLEXITY: Low   GOALS: Goals reviewed with patient? Yes  SHORT TERM GOALS: Target date: 07/19/23  Pt to be I with HEP.  Baseline: Goal status: INITIAL  2.  Pt to be I with abdominal massage for improved peristalsis for  bowel regularity.  Baseline:  Goal status: INITIAL  3.  Pt to be I with voiding mechanics for improved bowel emptying without straining to decreased stress at pelvic floor. Baseline:  Goal status: INITIAL   LONG TERM GOALS: Target date: 08/19/23  Pt to be I with advanced HEP.  Baseline:  Goal status: INITIAL  2.  Pt to  demonstrate minimal to no restrictions in abdominal quadrants due to improved mobility and decreased tension at bowels.  Baseline:  Goal status: INITIAL  3.  Pt to demonstrate full range of mobility at trunk in all directions for decreased strain at pelvic floor.  Baseline:  Goal status: INITIAL  4.  Pt will report her BMs are complete due to improved bowel habits and evacuation techniques at least 75% of the time.  Baseline:  Goal status: INITIAL  5.  Pt to demonstrate at least 4/5 pelvic floor strength and isometric for at least 45s rectally for improved pelvic stability and decreased strain at pelvic floor.  Baseline:  Goal status: INITIAL   PLAN:  PT FREQUENCY: 1-2x/week  PT DURATION:  10 sessions  PLANNED INTERVENTIONS: 97110-Therapeutic exercises, 97530- Therapeutic activity, [id number]- Neuromuscular re-education, 97535- Self Care, and [id number]- Manual therapy  PLAN FOR NEXT SESSION: internal if needed and pt consents, manual at abdomen, breathing and voiding mechanics, spine/hip/pelvic floor stretching  Otelia Sergeant, PT, DPT 03/17/252:07 PM

## 2023-07-10 ENCOUNTER — Ambulatory Visit (INDEPENDENT_AMBULATORY_CARE_PROVIDER_SITE_OTHER): Payer: BLUE CROSS/BLUE SHIELD

## 2023-07-10 ENCOUNTER — Other Ambulatory Visit (HOSPITAL_COMMUNITY): Payer: Self-pay

## 2023-07-10 VITALS — BP 112/78 | HR 67 | Ht 62.0 in | Wt 104.8 lb

## 2023-07-10 DIAGNOSIS — Z0000 Encounter for general adult medical examination without abnormal findings: Secondary | ICD-10-CM | POA: Diagnosis not present

## 2023-07-10 NOTE — Patient Instructions (Addendum)
 Alyssa Robinson , Thank you for taking time to come for your Medicare Wellness Visit. I appreciate your ongoing commitment to your health goals. Please review the following plan we discussed and let me know if I can assist you in the future.   Referrals/Orders/Follow-Ups/Clinician Recommendations: Aim for 30 minutes of exercise or brisk walking, 6-8 glasses of water, and 5 servings of fruits and vegetables each day.   This is a list of the screening recommended for you and due dates:  Health Maintenance  Topic Date Due   COVID-19 Vaccine (4 - 2024-25 season) 12/24/2022   Flu Shot  07/23/2023*   Medicare Annual Wellness Visit  07/09/2024   Mammogram  06/17/2025   DTaP/Tdap/Td vaccine (3 - Td or Tdap) 09/13/2025   Colon Cancer Screening  12/10/2028   Pneumonia Vaccine  Completed   DEXA scan (bone density measurement)  Completed   Hepatitis C Screening  Completed   Zoster (Shingles) Vaccine  Completed   HPV Vaccine  Aged Out  *Topic was postponed. The date shown is not the original due date.    Advanced directives: (In Chart) A copy of your advanced directives are scanned into your chart should your provider ever need it.  Next Medicare Annual Wellness Visit scheduled for next year: Yes

## 2023-07-10 NOTE — Progress Notes (Addendum)
 Subjective:   Alyssa Robinson is a 73 y.o. who presents for a Medicare Wellness preventive visit.  Visit Complete: In person  Persons Participating in Visit: Patient.  AWV Questionnaire: Yes: Patient Medicare AWV questionnaire was completed by the patient on 07/07/2023; I have confirmed that all information answered by patient is correct and no changes since this date.  Cardiac Risk Factors include: advanced age (>1men, >18 women);hypertension     Objective:    Today's Vitals   07/10/23 1013  BP: 112/78  Pulse: 67  SpO2: 97%  Weight: 104 lb 12.8 oz (47.5 kg)  Height: 5\' 2"  (1.575 m)   Body mass index is 19.17 kg/m.     07/10/2023   10:12 AM 06/21/2023    8:20 AM 06/02/2023    6:00 PM 06/02/2023    5:16 PM 06/02/2023    6:17 AM 05/09/2023   10:11 AM 02/15/2023   11:16 AM  Advanced Directives  Does Patient Have a Medical Advance Directive? Yes Yes  Yes No No Yes  Type of Estate agent of [place];Living will  Healthcare Power of [place];Living will    Healthcare Power of [place];Living will  Does patient want to make changes to medical advance directive? No - Patient declined No - Patient declined No - Patient declined    No - Patient declined  Copy of Healthcare Power of Attorney in Chart? Yes - validated most recent copy scanned in chart (See row information)      Yes - validated most recent copy scanned in chart (See row information)  Would patient like information on creating a medical advance directive?    No - Patient declined No - Patient declined No - Patient declined     Current Medications (verified) Outpatient Encounter Medications as of 07/10/2023  Medication Sig   loratadine (CLARITIN) 10 MG tablet Take 10 mg by mouth daily.   Probiotic Product (ULTRAFLORA IMMUNE HEALTH PO) Take 1 capsule by mouth daily.   sodium chloride (OCEAN) 0.65 % SOLN nasal spray Place 2-3 sprays into both nostrils 2 (two) times daily.   [DISCONTINUED] barrier cream  (NON-SPECIFIED) CREA Apply 1 Application topically 2 (two) times daily as needed.   [DISCONTINUED] miconazole (MICOTIN) 2 % cream Apply 1 Application topically 2 (two) times daily.   [DISCONTINUED] ondansetron (ZOFRAN-ODT) 4 MG disintegrating tablet Take 1 tablet (4 mg total) by mouth every 8 (eight) hours as needed for nausea or vomiting. (Patient not taking: Reported on 06/15/2023)   [DISCONTINUED] Pediatric Multiple Vit-C-FA (PEDIATRIC MULTIVITAMIN) chewable tablet Chew 1 tablet by mouth daily. [place] (Patient not taking: Reported on 06/11/2023)   [DISCONTINUED] zinc oxide (MEIJER ZINC OXIDE) 20 % ointment Apply 1 Application topically as needed for irritation.   No facility-administered encounter medications on file as of 07/10/2023.    Allergies (verified) Caffeine, Fluad quadrivalent [influenza vac a&b sa adj quad], and Sodium lauryl sulfate   History: Past Medical History:  Diagnosis Date   Allergy 20-25 years ago   Anemia    hx   Arthritis 8-10 years ago   Blood transfusion without reported diagnosis 20 years ago   Bronchitis    Bronchitis    hx   Closed patellar sleeve fracture of left knee    Diverticulosis    GERD (gastroesophageal reflux disease)    occ   Headache(784.0)    migraines occ   Hypertension    no meds in over 65yrs   Pneumonia    Seasonal allergies  Past Surgical History:  Procedure Laterality Date   ABDOMINAL HYSTERECTOMY     73 y/o   APPENDECTOMY  03/17/2013   Procedure: INCIDENTAL APPENDECTOMY;  Surgeon: Cherylynn Ridges, MD;  Location[facility] OR;  Service: General;;   capsule endoscopy     COLON SURGERY     COLONOSCOPY     in 2012 -    COLOSTOMY CLOSURE  03/17/2013   COLOSTOMY REVERSAL  03/17/2013   Procedure: COLOSTOMY REVERSAL;  Surgeon: Cherylynn Ridges, MD;  Location: [facility] OR;  Service: General;;   EUS N/A 04/30/2015   Procedure: UPPER ENDOSCOPIC ULTRASOUND (EUS) LINEAR;  Surgeon: Jeani Hawking, MD;  Location: [facility] ENDOSCOPY;  Service: Endoscopy;   Laterality: N/A;   HERNIA REPAIR     X2 2-4 y/o inguinal   JOINT REPLACEMENT  ORID Knee/right hip   2021/2022   LAPAROTOMY N/A 10/17/2012   Procedure: EXPLORATORY LAPAROTOMY,  COLON RESECTION AND COLOSTOMY;  Surgeon: Cherylynn Ridges, MD;  Location: [facility] OR;  Service: General;  Laterality: N/A;   LIPOMA EXCISION Left    left arm   LYSIS OF ADHESION  03/17/2013   Procedure: LYSIS OF ADHESION;  Surgeon: Cherylynn Ridges, MD;  Location: [facility] OR;  Service: General;;   ORIF PATELLA Left 02/20/2020   Procedure: OPEN REDUCTION INTERNAL (ORIF) FIXATION LEFT PATELLA;  Surgeon: Tarry Kos, MD;  Location:  SURGERY CENTER;  Service: Orthopedics;  Laterality: Left;   SMALL INTESTINE SURGERY  Perforated colon   2014   TOTAL HIP ARTHROPLASTY Right 09/12/2021   Procedure: RIGHT TOTAL HIP ARTHROPLASTY ANTERIOR APPROACH;  Surgeon: Tarry Kos, MD;  Location: [facility] OR;  Service: Orthopedics;  Laterality: Right;  3-C   TOTAL HIP ARTHROPLASTY Left 02/23/2023   Procedure: LEFT TOTAL HIP ARTHROPLASTY ANTERIOR APPROACH;  Surgeon: Tarry Kos, MD;  Location: [facility] OR;  Service: Orthopedics;  Laterality: Left;  3-C   UPPER GASTROINTESTINAL ENDOSCOPY     2013 -   Family History  Problem Relation Age of Onset   Esophageal cancer Mother        and ovarian cancer   Alcohol abuse Mother    Arthritis Mother    Cancer Mother    Depression Mother    Heart disease Father    Alcohol abuse Father    Hypertension Father    Diabetes Father    Early death Father    Breast cancer Sister    Colon cancer Other    Pancreatic cancer Other    Cancer Maternal Aunt    Cancer Maternal Aunt    Stroke Neg Hx    Hyperlipidemia Neg Hx    Kidney disease Neg Hx    COPD Neg Hx    Social History   Socioeconomic History   Marital status: Married    Spouse name: Not on file   Number of children: Not on file   Years of education: Not on file   Highest education level: Bachelor's degree (e.g., BA, AB, BS)  Occupational  History   Not on file  Tobacco Use   Smoking status: Never    Passive exposure: Never   Smokeless tobacco: Never   Tobacco comments:    Second hand smoke with mother/father smoking  Vaping Use   Vaping status: Never Used  Substance and Sexual Activity   Alcohol use: Not Currently    Alcohol/week: 2.0 standard drinks of alcohol   Drug use: No   Sexual activity: Yes    Birth control/protection: Post-menopausal  Other Topics Concern   Not on file  Social History Narrative   Not on file   Social Drivers of Health   Financial Resource Strain: Low Risk  (07/10/2023)   Overall Financial Resource Strain (CARDIA)    Difficulty of Paying Living Expenses: Not hard at all  Food Insecurity: No Food Insecurity (07/10/2023)   Hunger Vital Sign    Worried About Running Out of Food in the Last Year: Never true    Ran Out of Food in the Last Year: Never true  Transportation Needs: No Transportation Needs (07/10/2023)   PRAPARE - Administrator, Civil Service (Medical): No    Lack of Transportation (Non-Medical): No  Physical Activity: Sufficiently Active (07/10/2023)   Exercise Vital Sign    Days of Exercise per Week: 5 days    Minutes of Exercise per Session: 60 min  Stress: No Stress Concern Present (07/10/2023)   [employer] of Occupational Health - Occupational Stress Questionnaire    Feeling of Stress : Not at all  Social Connections: Socially Integrated (07/10/2023)   Social Connection and Isolation Panel [NHANES]    Frequency of Communication with Friends and Family: More than three times a week    Frequency of Social Gatherings with Friends and Family: More than three times a week    Attends Religious Services: More than 4 times per year    Active Member of [employer] or Organizations: Yes    Attends Banker Meetings: 1 to 4 times per year    Marital Status: Married    Tobacco Counseling Counseling given: Not Answered Tobacco comments: Second hand smoke  with mother/father smoking    Clinical Intake:  Pre-visit preparation completed: Yes  Pain : No/denies pain     BMI - recorded: 19.17 Nutritional Status: BMI <19  Underweight Nutritional Risks: None Diabetes: No  How often do you need to have someone help you when you read instructions, pamphlets, or other written materials from your doctor or pharmacy?: 1 - Never  Interpreter Needed?: No  Information entered by :: Hassell Halim, CMA   Activities of Daily Living     07/10/2023   10:18 AM 07/07/2023   12:17 PM  In your present state of health, do you have any difficulty performing the following activities:  Hearing? 0 0  Vision? 0 0  Difficulty concentrating or making decisions? 0 0  Walking or climbing stairs? 0 0  Dressing or bathing? 0 0  Doing errands, shopping? 0 0  Preparing Food and eating ? N N  Using the Toilet? N N  In the past six months, have you accidently leaked urine? Y Y  Comment no Urology referral needed per pt   Do you have problems with loss of bowel control? N N  Managing your Medications? N N  Managing your Finances? N N  Housekeeping or managing your Housekeeping? N N    Patient Care Team: Myrlene Broker, MD as PCP - General (Internal Medicine)  Indicate any recent Medical Services you may have received from other than [facility] providers in the past year (date may be approximate).     Assessment:   This is a routine wellness examination for [place].  Hearing/Vision screen Hearing Screening - Comments:: Denies hearing difficulties   Vision Screening - Comments:: Wears rx glasses - up to date with routine eye exams with Dr Leveda Anna   Goals Addressed  This Visit's Progress     Patient Stated (pt-stated)        Patient stated she plans to get back to being healthy due to having surgery (left hip/bowel obstruction)       Depression Screen     07/10/2023   10:20 AM 05/16/2023    9:38 AM 09/13/2022   10:26 AM  07/07/2022    8:10 AM 02/03/2022   11:29 AM 07/05/2021    8:40 AM 07/01/2020    8:42 AM  PHQ 2/9 Scores  PHQ - 2 Score 0 0 0 0 0 0 0  PHQ- 9 Score 0          Fall Risk     07/10/2023   10:18 AM 07/07/2023   12:17 PM 06/15/2023    9:17 AM 05/16/2023    9:38 AM 09/13/2022   10:26 AM  Fall Risk   Falls in the past year? 0 0 0 0 0  Number falls in past yr: 0 0 0 0 0  Injury with Fall? 0 0 0 0 0  Risk for fall due to : No Fall Risks   No Fall Risks No Fall Risks  Follow up Falls evaluation completed;Falls prevention discussed  Falls evaluation completed Falls evaluation completed Falls evaluation completed    MEDICARE RISK AT HOME:  Medicare Risk at Home Any stairs in or around the home?: No (none inside) If so, are there any without handrails?: No Home free of loose throw rugs in walkways, pet beds, electrical cords, etc?: Yes Adequate lighting in your home to reduce risk of falls?: Yes Life alert?: No Use of a cane, walker or w/c?: No Grab bars in the bathroom?: Yes Shower chair or bench in shower?: Yes Elevated toilet seat or a handicapped toilet?: Yes  TIMED UP AND GO:  Was the test performed?  No  Cognitive Function: 6CIT completed        07/10/2023   10:22 AM  6CIT Screen  What Year? 0 points  What month? 0 points  What time? 0 points  Count back from 20 0 points  Months in reverse 0 points  Repeat phrase 0 points  Total Score 0 points    Immunizations Immunization History  Administered Date(s) Administered   Fluad Quad(high Dose 65+) 06/08/2022   Influenza, High Dose Seasonal PF 12/16/2018   Influenza, Quadrivalent, Recombinant, Inj, Pf 01/30/2018, 01/16/2021   Influenza,inj,Quad PF,6+ Mos 03/25/2020   PFIZER(Purple Top)SARS-COV-2 Vaccination 05/31/2019, 06/25/2019, 04/29/2020   Pneumococcal Conjugate-13 05/13/2014   Pneumococcal Polysaccharide-23 09/14/2015   Rabies, IM 12/07/2011, 12/10/2011, 12/14/2011, 12/21/2011   Rabies, intradermal 12/07/2011    Tdap 11/23/2005, 09/14/2015   Zoster Recombinant(Shingrix) 02/18/2019, 04/30/2019   Zoster, Live 11/11/2009    Screening Tests Health Maintenance  Topic Date Due   COVID-19 Vaccine (4 - 2024-25 season) 12/24/2022   INFLUENZA VACCINE  07/23/2023 (Originally 11/23/2022)   Medicare Annual Wellness (AWV)  07/09/2024   MAMMOGRAM  06/17/2025   DTaP/Tdap/Td (3 - Td or Tdap) 09/13/2025   Colonoscopy  12/10/2028   Pneumonia Vaccine 53+ Years old  Completed   DEXA SCAN  Completed   Hepatitis C Screening  Completed   Zoster Vaccines- Shingrix  Completed   HPV VACCINES  Aged Out    Health Maintenance  Health Maintenance Due  Topic Date Due   COVID-19 Vaccine (4 - 2024-25 season) 12/24/2022   Health Maintenance Items Addressed: 07/10/2023  Mammogram status: Completed in 2024.  Pt stated had a Mammogram in 05/2023 w/[facility] (asked pt to obtain a report)  Additional Screening:  Vision Screening: Recommended annual ophthalmology exams for early detection of glaucoma and other disorders of the eye.  Dental Screening: Recommended annual dental exams for proper oral hygiene  Community Resource Referral / Chronic Care Management: CRR required this visit?  No   CCM required this visit?  No     Plan:     I have personally reviewed and noted the following in the patient's chart:   Medical and social history Use of alcohol, tobacco or illicit drugs  Current medications and supplements including opioid prescriptions. Patient is not currently taking opioid prescriptions. Functional ability and status Nutritional status Physical activity Advanced directives List of other physicians Hospitalizations, surgeries, and ER visits in previous 12 months Vitals Screenings to include cognitive, depression, and falls Referrals and appointments  In addition, I have reviewed and discussed with patient certain preventive protocols, quality metrics, and best practice  recommendations. A written personalized care plan for preventive services as well as general preventive health recommendations were provided to patient.     Darreld Mclean, CMA   07/10/2023   After Visit Summary: (MyChart) Due to this being a telephonic visit, the after visit summary with patients personalized plan was offered to patient via MyChart   Notes: Nothing significant to report at this time.

## 2023-07-19 ENCOUNTER — Other Ambulatory Visit: Payer: Self-pay

## 2023-07-19 ENCOUNTER — Ambulatory Visit: Payer: Self-pay | Admitting: Physical Therapy

## 2023-07-19 DIAGNOSIS — R293 Abnormal posture: Secondary | ICD-10-CM

## 2023-07-19 DIAGNOSIS — M6281 Muscle weakness (generalized): Secondary | ICD-10-CM

## 2023-07-19 DIAGNOSIS — R279 Unspecified lack of coordination: Secondary | ICD-10-CM

## 2023-07-19 NOTE — Therapy (Signed)
 OUTPATIENT PHYSICAL THERAPY FEMALE PELVIC TREATMENT   Patient Name: Alyssa Robinson MRN: [id number] DOB:[birth year], 73 y.o., female Today's Date: 07/19/2023  END OF SESSION:  PT End of Session - 07/19/23 1227     Visit Number 5    Date for PT Re-Evaluation 08/19/23    Authorization Type MCR    PT Start Time 1228    PT Stop Time 1308    PT Time Calculation (min) 40 min    Activity Tolerance Patient tolerated treatment well    Behavior During Therapy WFL for tasks assessed/performed             Past Medical History:  Diagnosis Date   Allergy 20-25 years ago   Anemia    hx   Arthritis 8-10 years ago   Blood transfusion without reported diagnosis 20 years ago   Bronchitis    Bronchitis    hx   Closed patellar sleeve fracture of left knee    Diverticulosis    GERD (gastroesophageal reflux disease)    occ   Headache(784.0)    migraines occ   Hypertension    no meds in over 67yrs   Pneumonia    Seasonal allergies    Past Surgical History:  Procedure Laterality Date   ABDOMINAL HYSTERECTOMY     73 y/o   APPENDECTOMY  03/17/2013   Procedure: INCIDENTAL APPENDECTOMY;  Surgeon: Cherylynn Ridges, MD;  Location: [facility] OR;  Service: General;;   capsule endoscopy     COLON SURGERY     COLONOSCOPY     in 2012 -    COLOSTOMY CLOSURE  03/17/2013   COLOSTOMY REVERSAL  03/17/2013   Procedure: COLOSTOMY REVERSAL;  Surgeon: Cherylynn Ridges, MD;  Location: [facility] OR;  Service: General;;   EUS N/A 04/30/2015   Procedure: UPPER ENDOSCOPIC ULTRASOUND (EUS) LINEAR;  Surgeon: Jeani Hawking, MD;  Location: [facility];  Service: Endoscopy;  Laterality: N/A;   HERNIA REPAIR     X2 2-4 y/o inguinal   JOINT REPLACEMENT  ORID Knee/right hip   2021/2022   LAPAROTOMY N/A 10/17/2012   Procedure: EXPLORATORY LAPAROTOMY,  COLON RESECTION AND COLOSTOMY;  Surgeon: Cherylynn Ridges, MD;  Location: [facility] OR;  Service: General;  Laterality: N/A;   LIPOMA EXCISION Left    left arm   LYSIS OF ADHESION   03/17/2013   Procedure: LYSIS OF ADHESION;  Surgeon: Cherylynn Ridges, MD;  Location: [facility] OR;  Service: General;;   ORIF PATELLA Left 02/20/2020   Procedure: OPEN REDUCTION INTERNAL (ORIF) FIXATION LEFT PATELLA;  Surgeon: Tarry Kos, MD;  Location: [facility];  Service: Orthopedics;  Laterality: Left;   SMALL INTESTINE SURGERY  Perforated colon   2014   TOTAL HIP ARTHROPLASTY Right 09/12/2021   Procedure: RIGHT TOTAL HIP ARTHROPLASTY ANTERIOR APPROACH;  Surgeon: Tarry Kos, MD;  Location: [facility] OR;  Service: Orthopedics;  Laterality: Right;  3-C   TOTAL HIP ARTHROPLASTY Left 02/23/2023   Procedure: LEFT TOTAL HIP ARTHROPLASTY ANTERIOR APPROACH;  Surgeon: Tarry Kos, MD;  Location: [facility] OR;  Service: Orthopedics;  Laterality: Left;  3-C   UPPER GASTROINTESTINAL ENDOSCOPY     2013 -   Patient Active Problem List   Diagnosis Date Noted   Mild protein-calorie malnutrition (HCC) 06/05/2023   Anogenital candidiasis in female 06/04/2023   SBO (small bowel obstruction) (HCC) 05/08/2023   Status post total replacement of left hip 02/23/2023   History of colonic polyps 02/03/2022   Family history of  early CAD 07/02/2020   Lipoma of abdominal wall 08/04/2019   Osteopenia 01/28/2019   Thrombocytopenia (HCC) 10/17/2017   Vaginal atrophy 06/01/2015   Cyst of pancreas 03/06/2015   Glaucoma suspect of both eyes 03/11/2014   Nuclear sclerosis of both eyes 03/11/2014   Routine general medical examination at a health care facility 12/11/2012   Hyperglycemia 12/11/2012   Essential hypertension, benign 12/11/2012    PCP: Myrlene Broker, MD   REFERRING PROVIDER: Myrlene Broker, MD   REFERRING DIAG: [phone] (ICD-10-CM) - SBO (small bowel obstruction) (HCC)  THERAPY DIAG:  Muscle weakness (generalized)  Abnormal posture  Unspecified lack of coordination  Rationale for Evaluation and Treatment: Rehabilitation  ONSET DATE: chronic  SUBJECTIVE:                                                                                                                                                                                            SUBJECTIVE STATEMENT: Has been keeping bowel diary and has been having improvement with regularity and type 3 -4 stools. No straining and having larger amounts emptying now. And no longer having pencil width stools but wider and no pain  Fluid intake: 12oz water in am x2, boost, orange juice, 2 more glasses of water in afternoon, and one at dinner - is eating a lot of broths as well  PAIN:  Are you having pain? No   PRECAUTIONS: None  RED FLAGS: None   WEIGHT BEARING RESTRICTIONS: No  FALLS:  Has patient fallen in last 6 months? 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  ACTIVITY LEVEL : walking every day 5000-8000 steps daily; and active with friends   PLOF: Independent  PATIENT GOALS: to have more regular bowels  PERTINENT HISTORY:  HYSTERECTOMY, COLOSTOMY REVERSAL, HERNIA REPAIR x2 at 73 yo, Rt hip replacement Sexual abuse: No  BOWEL MOVEMENT: Pain with bowel movement: Yes sometimes Type of bowel movement:Type (Bristol Stool Scale) 1, Frequency usually once in the morning, and Strain yes  Fully empty rectum: No Leakage: No Pads: No Fiber supplement/laxative colace (2x daily)  URINATION: Pain with urination: Yes not often but sometimes pressure/tightness at abdomen Fully empty bladder: No Stream: Strong and Weak Urgency: No Frequency: no Leakage: Laughing Pads: No  INTERCOURSE:  Ability to have vaginal penetration Yes  Pain with intercourse: Initial Penetration DrynessYes  Climax: hard to achieve  Marinoff Scale: 0/3  PREGNANCY: Vaginal deliveries 0 Tearing No Episiotomy No C-section deliveries 0 Currently pregnant No  PROLAPSE: None   OBJECTIVE:  Note: Objective measures were completed at Evaluation unless otherwise noted.  DIAGNOSTIC  FINDINGS:     COGNITION: Overall cognitive status: Within functional limits for tasks assessed     SENSATION: Light touch: Appears intact  GAIT: WFL  POSTURE: rounded shoulders and posterior pelvic tilt   LUMBARAROM/PROM:  A/PROM A/PROM  eval  Flexion WFL  Extension WFL  Right lateral flexion Limited by 25%  Left lateral flexion Limited by 25%  Right rotation Limited by 25%  Left rotation Limited by 25%   (Blank rows = not tested)  LOWER EXTREMITY ROM:  WFL   LOWER EXTREMITY MMT:  WFL  PALPATION:   General: tightness in bil piriformis and lumbar paraspinals   Pelvic Alignment: WFL  Abdominal: several scars throughout abdominal quadrants with restrictions in all directions but no pain reports feeling tight                External Perineal Exam: no TTP                              Internal Pelvic Floor: no TTP, tightness throughout  Patient confirms identification and approves PT to assess internal pelvic floor and treatment Yes No emotional/communication barriers or cognitive limitation. Patient is motivated to learn. Patient understands and agrees with treatment goals and plan. PT explains patient will be examined in standing, sitting, and lying down to see how their muscles and joints work. When they are ready, they will be asked to remove their underwear so PT can examine their perineum. The patient is also given the option of providing their own chaperone as one is not provided in our facility. The patient also has the right and is explained the right to defer or refuse any part of the evaluation or treatment including the internal exam. With the patient's consent, PT will use one gloved finger to gently assess the muscles of the pelvic floor, seeing how well it contracts and relaxes and if there is muscle symmetry. After, the patient will get dressed and PT and patient will discuss exam findings and plan of care. PT and patient discuss plan of care, schedule, attendance policy  and HEP activities.   PELVIC MMT:   MMT eval  Vaginal   Internal Anal Sphincter 3/5  External Anal Sphincter 4/5, 3s (3/5 additional 20s), 7 reps  Puborectalis   Diastasis Recti   (Blank rows = not tested)        TONE: Slightly decreased   PROLAPSE: Not seen in rectal assessment   TODAY'S TREATMENT:                                                                                                                              DATE:   07/02/23: Patient consented to internal pelvic floor assessment rectally this date and found to have continued tension at superficial and deep pelvic floor layers but not painful. Pt reports she does feel the tightness though. Manual completed bil with good effect  with gentle stretching tolerated well.  Marjo Bicker pose 3x30s with pillow block X10 cat/cow 2x30s happy baby in sitting   07/09/23: Patient consented to internal pelvic floor treatment rectally this date and found to have continued tension at superficial and deep pelvic floor layers but not painful. Pt reports she does feel the tightness.. Manual completed bil with good effect with gentle stretching tolerated well.  Pt reports feeling more relaxed afterward. Pt had questions regarding GI appointment recently with now able to eat soluble foods, pt did receive handout from GI discussing this and PT encouraged pt to follow those recommendations as MD set them. Pt agreed.   07/19/23:  Vibration plate [id number] sitting and 2x30s standing low setting for tissue mobility and relaxation Single knee chest 3x30s >double knee to chest 2x30s Piriformis stretch 3x30s Butterfly stretch 3x30s Lumbar rotation stretching x10 5s each Seated pelvic tilts x10   PATIENT EDUCATION:  Education details: abdominal massage, voiding mechanics, [id number] Person educated: Patient Education method: Explanation, Demonstration, Tactile cues, Verbal cues, and Handouts Education comprehension: verbalized understanding, returned  demonstration, verbal cues required, tactile cues required, and needs further education  HOME EXERCISE PROGRAM: [id number]  ASSESSMENT:  CLINICAL IMPRESSION: Patient is a 73 y.o. female  who was seen today for physical therapy evaluation and treatment for decreased bowel regularity however today pt states she has been having great improvement. Has had type 3-4 stools, no straining or pain and movements daily. Pt tolerated session well denied pain throughout and reports feeling less tight at rectal area and abdomen and benefited from cues for techniques for relaxation and mechanics for stretching. Felt a lot of release with vibration plate today. Due to medical history of several bowel diagnoses would benefit from additional PT to address deficits.        OBJECTIVE IMPAIRMENTS: decreased activity tolerance, decreased coordination, decreased endurance, decreased mobility, decreased strength, increased fascial restrictions, increased muscle spasms, impaired flexibility, postural dysfunction, and pain.   ACTIVITY LIMITATIONS: continence  PARTICIPATION LIMITATIONS: interpersonal relationship and community activity  PERSONAL FACTORS: Time since onset of injury/illness/exacerbation and 1 comorbidity: medical history  are also affecting patient's functional outcome.   REHAB POTENTIAL: Good  CLINICAL DECISION MAKING: Stable/uncomplicated  EVALUATION COMPLEXITY: Low   GOALS: Goals reviewed with patient? Yes  SHORT TERM GOALS: Target date: 07/19/23 *all STG met 07/19/23  Pt to be I with HEP.  Baseline: Goal status: MET  2.  Pt to be I with abdominal massage for improved peristalsis for  bowel regularity.  Baseline:  Goal status: MET  3.  Pt to be I with voiding mechanics for improved bowel emptying without straining to decreased stress at pelvic floor. Baseline:  Goal status: MET   LONG TERM GOALS: Target date: 08/19/23  Pt to be I with advanced HEP.  Baseline:  Goal status: on  going  2.  Pt to demonstrate minimal to no restrictions in abdominal quadrants due to improved mobility and decreased tension at bowels.  Baseline:  Goal status: on going  3.  Pt to demonstrate full range of mobility at trunk in all directions for decreased strain at pelvic floor.  Baseline:  Goal status: on going  4.  Pt will report her BMs are complete due to improved bowel habits and evacuation techniques at least 75% of the time.  Baseline:  Goal status: on going  5.  Pt to demonstrate at least 4/5 pelvic floor strength and isometric for at least 45s rectally for improved pelvic stability and decreased strain at  pelvic floor.  Baseline:  Goal status: on going   PLAN:  PT FREQUENCY: 1-2x/week  PT DURATION:  10 sessions  PLANNED INTERVENTIONS: 97110-Therapeutic exercises, 97530- Therapeutic activity, [id number]- Neuromuscular re-education, 97535- Self Care, and [id number]- Manual therapy  PLAN FOR NEXT SESSION: internal if needed and pt consents, manual at abdomen, breathing and voiding mechanics, spine/hip/pelvic floor stretching  Otelia Sergeant, PT, DPT 03/27/254:23 PM

## 2023-07-23 ENCOUNTER — Ambulatory Visit: Payer: Self-pay | Admitting: Physical Therapy

## 2023-07-23 DIAGNOSIS — R279 Unspecified lack of coordination: Secondary | ICD-10-CM | POA: Diagnosis not present

## 2023-07-23 DIAGNOSIS — M6281 Muscle weakness (generalized): Secondary | ICD-10-CM | POA: Diagnosis not present

## 2023-07-23 DIAGNOSIS — R293 Abnormal posture: Secondary | ICD-10-CM

## 2023-07-23 NOTE — Therapy (Signed)
 OUTPATIENT PHYSICAL THERAPY FEMALE PELVIC TREATMENT   Patient Name: Alyssa Robinson MRN: [id number] DOB:[birth year], 73 y.o., female Today's Date: 07/23/2023  END OF SESSION:  PT End of Session - 07/23/23 0848     Visit Number 6    Date for PT Re-Evaluation 08/19/23    Authorization Type MCR    PT Start Time 0845    PT Stop Time 0927    PT Time Calculation (min) 42 min    Activity Tolerance Patient tolerated treatment well    Behavior During Therapy WFL for tasks assessed/performed              Past Medical History:  Diagnosis Date   Allergy 20-25 years ago   Anemia    hx   Arthritis 8-10 years ago   Blood transfusion without reported diagnosis 20 years ago   Bronchitis    Bronchitis    hx   Closed patellar sleeve fracture of left knee    Diverticulosis    GERD (gastroesophageal reflux disease)    occ   Headache(784.0)    migraines occ   Hypertension    no meds in over 22yrs   Pneumonia    Seasonal allergies    Past Surgical History:  Procedure Laterality Date   ABDOMINAL HYSTERECTOMY     73 y/o   APPENDECTOMY  03/17/2013   Procedure: INCIDENTAL APPENDECTOMY;  Surgeon: Cherylynn Ridges, MD;  Location: [facility] OR;  Service: General;;   capsule endoscopy     COLON SURGERY     COLONOSCOPY     in 2012 -    COLOSTOMY CLOSURE  03/17/2013   COLOSTOMY REVERSAL  03/17/2013   Procedure: COLOSTOMY REVERSAL;  Surgeon: Cherylynn Ridges, MD;  Location: [facility] OR;  Service: General;;   EUS N/A 04/30/2015   Procedure: UPPER ENDOSCOPIC ULTRASOUND (EUS) LINEAR;  Surgeon: Jeani Hawking, MD;  Location: [facility];  Service: Endoscopy;  Laterality: N/A;   HERNIA REPAIR     X2 2-4 y/o inguinal   JOINT REPLACEMENT  ORID Knee/right hip   2021/2022   LAPAROTOMY N/A 10/17/2012   Procedure: EXPLORATORY LAPAROTOMY,  COLON RESECTION AND COLOSTOMY;  Surgeon: Cherylynn Ridges, MD;  Location: [facility] OR;  Service: General;  Laterality: N/A;   LIPOMA EXCISION Left    left arm   LYSIS OF ADHESION   03/17/2013   Procedure: LYSIS OF ADHESION;  Surgeon: Cherylynn Ridges, MD;  Location: [facility] OR;  Service: General;;   ORIF PATELLA Left 02/20/2020   Procedure: OPEN REDUCTION INTERNAL (ORIF) FIXATION LEFT PATELLA;  Surgeon: Tarry Kos, MD;  Location: [facility];  Service: Orthopedics;  Laterality: Left;   SMALL INTESTINE SURGERY  Perforated colon   2014   TOTAL HIP ARTHROPLASTY Right 09/12/2021   Procedure: RIGHT TOTAL HIP ARTHROPLASTY ANTERIOR APPROACH;  Surgeon: Tarry Kos, MD;  Location: [facility] OR;  Service: Orthopedics;  Laterality: Right;  3-C   TOTAL HIP ARTHROPLASTY Left 02/23/2023   Procedure: LEFT TOTAL HIP ARTHROPLASTY ANTERIOR APPROACH;  Surgeon: Tarry Kos, MD;  Location: [facility] OR;  Service: Orthopedics;  Laterality: Left;  3-C   UPPER GASTROINTESTINAL ENDOSCOPY     2013 -   Patient Active Problem List   Diagnosis Date Noted   Mild protein-calorie malnutrition (HCC) 06/05/2023   Anogenital candidiasis in female 06/04/2023   SBO (small bowel obstruction) (HCC) 05/08/2023   Status post total replacement of left hip 02/23/2023   History of colonic polyps 02/03/2022   Family history  of early CAD 07/02/2020   Lipoma of abdominal wall 08/04/2019   Osteopenia 01/28/2019   Thrombocytopenia (HCC) 10/17/2017   Vaginal atrophy 06/01/2015   Cyst of pancreas 03/06/2015   Glaucoma suspect of both eyes 03/11/2014   Nuclear sclerosis of both eyes 03/11/2014   Routine general medical examination at a health care facility 12/11/2012   Hyperglycemia 12/11/2012   Essential hypertension, benign 12/11/2012    PCP: Myrlene Broker, MD   REFERRING PROVIDER: Myrlene Broker, MD   REFERRING DIAG: [phone] (ICD-10-CM) - SBO (small bowel obstruction) (HCC)  THERAPY DIAG:  Muscle weakness (generalized)  Unspecified lack of coordination  Abnormal posture  Rationale for Evaluation and Treatment: Rehabilitation  ONSET DATE: chronic  SUBJECTIVE:                                                                                                                                                                                            SUBJECTIVE STATEMENT: Has been going every morning now and a few times has had larger full stools; and a few days goes every couple of hours during the day to fully empty but usually type 3-4 and now straining.  Has been increasing diet without changes to bowel movements.   Fluid intake: 12oz water in am x2, boost, orange juice, 2 more glasses of water in afternoon, and one at dinner - is eating a lot of broths as well  PAIN:  Are you having pain? No   PRECAUTIONS: None  RED FLAGS: None   WEIGHT BEARING RESTRICTIONS: No  FALLS:  Has patient fallen in last 6 months? 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  ACTIVITY LEVEL : walking every day 5000-8000 steps daily; and active with friends   PLOF: Independent  PATIENT GOALS: to have more regular bowels  PERTINENT HISTORY:  HYSTERECTOMY, COLOSTOMY REVERSAL, HERNIA REPAIR x2 at 73 yo, Rt hip replacement Sexual abuse: No  BOWEL MOVEMENT: Pain with bowel movement: Yes sometimes Type of bowel movement:Type (Bristol Stool Scale) 1, Frequency usually once in the morning, and Strain yes  Fully empty rectum: No Leakage: No Pads: No Fiber supplement/laxative colace (2x daily)  URINATION: Pain with urination: Yes not often but sometimes pressure/tightness at abdomen Fully empty bladder: No Stream: Strong and Weak Urgency: No Frequency: no Leakage: Laughing Pads: No  INTERCOURSE:  Ability to have vaginal penetration Yes  Pain with intercourse: Initial Penetration DrynessYes  Climax: hard to achieve  Marinoff Scale: 0/3  PREGNANCY: Vaginal deliveries 0 Tearing No Episiotomy No C-section deliveries 0 Currently pregnant No  PROLAPSE: None   OBJECTIVE:  Note: Objective measures were completed at Evaluation unless  otherwise noted.  DIAGNOSTIC FINDINGS:    COGNITION: Overall cognitive status: Within functional limits for tasks assessed     SENSATION: Light touch: Appears intact  GAIT: WFL  POSTURE: rounded shoulders and posterior pelvic tilt   LUMBARAROM/PROM:  A/PROM A/PROM  eval  Flexion WFL  Extension WFL  Right lateral flexion Limited by 25%  Left lateral flexion Limited by 25%  Right rotation Limited by 25%  Left rotation Limited by 25%   (Blank rows = not tested)  LOWER EXTREMITY ROM:  WFL   LOWER EXTREMITY MMT:  WFL  PALPATION:   General: tightness in bil piriformis and lumbar paraspinals   Pelvic Alignment: WFL  Abdominal: several scars throughout abdominal quadrants with restrictions in all directions but no pain reports feeling tight                External Perineal Exam: no TTP                              Internal Pelvic Floor: no TTP, tightness throughout  Patient confirms identification and approves PT to assess internal pelvic floor and treatment Yes No emotional/communication barriers or cognitive limitation. Patient is motivated to learn. Patient understands and agrees with treatment goals and plan. PT explains patient will be examined in standing, sitting, and lying down to see how their muscles and joints work. When they are ready, they will be asked to remove their underwear so PT can examine their perineum. The patient is also given the option of providing their own chaperone as one is not provided in our facility. The patient also has the right and is explained the right to defer or refuse any part of the evaluation or treatment including the internal exam. With the patient's consent, PT will use one gloved finger to gently assess the muscles of the pelvic floor, seeing how well it contracts and relaxes and if there is muscle symmetry. After, the patient will get dressed and PT and patient will discuss exam findings and plan of care. PT and patient discuss  plan of care, schedule, attendance policy and HEP activities.   PELVIC MMT:   MMT eval  Vaginal   Internal Anal Sphincter 3/5  External Anal Sphincter 4/5, 3s (3/5 additional 20s), 7 reps  Puborectalis   Diastasis Recti   (Blank rows = not tested)        TONE: Slightly decreased   PROLAPSE: Not seen in rectal assessment   TODAY'S TREATMENT:                                                                                                                              DATE:   07/02/23: Patient consented to internal pelvic floor assessment rectally this date and found to have continued tension at superficial and deep pelvic floor layers but not painful. Pt reports  she does feel the tightness though. Manual completed bil with good effect with gentle stretching tolerated well.  Marjo Bicker pose 3x30s with pillow block X10 cat/cow 2x30s happy baby in sitting   07/09/23: Patient consented to internal pelvic floor treatment rectally this date and found to have continued tension at superficial and deep pelvic floor layers but not painful. Pt reports she does feel the tightness.. Manual completed bil with good effect with gentle stretching tolerated well.  Pt reports feeling more relaxed afterward. Pt had questions regarding GI appointment recently with now able to eat soluble foods, pt did receive handout from GI discussing this and PT encouraged pt to follow those recommendations as MD set them. Pt agreed.   07/19/23:  Vibration plate [id number] sitting and 2x30s standing low setting for tissue mobility and relaxation Single knee chest 3x30s >double knee to chest 2x30s Piriformis stretch 3x30s Butterfly stretch 3x30s Lumbar rotation stretching x10 5s each Seated pelvic tilts x10  07/23/23: Vibration plate [id number] standing, 2x33min sitting low setting for tissue mobility and relaxation for improved bowel evacuation Manual at abdomen for scar massage and colon abdominal massage with fascial release  direct techniques, ILU, "m" techniques, and CCW/CW with noted improvement with mobility at end of session   PATIENT EDUCATION:  Education details: abdominal massage, voiding mechanics, [id number] Person educated: Patient Education method: Explanation, Demonstration, Tactile cues, Verbal cues, and Handouts Education comprehension: verbalized understanding, returned demonstration, verbal cues required, tactile cues required, and needs further education  HOME EXERCISE PROGRAM: [facility]  ASSESSMENT:  CLINICAL IMPRESSION: Patient is a 73 y.o. female  who was seen today for physical therapy evaluation and treatment for decreased bowel regularity however today pt states she has been having great improvement. Has had type 3-4 stools, no straining or pain and movements daily. Pt tolerated session well denied pain throughout and reports feeling less tight at rectal area and abdomen.. Felt a lot of release with vibration plate again today. Demonstrated improvement with tissue mobility at abdomen today and states she feels much better at end of session.  Due to medical history of several bowel diagnoses would benefit from additional PT to address deficits.        OBJECTIVE IMPAIRMENTS: decreased activity tolerance, decreased coordination, decreased endurance, decreased mobility, decreased strength, increased fascial restrictions, increased muscle spasms, impaired flexibility, postural dysfunction, and pain.   ACTIVITY LIMITATIONS: continence  PARTICIPATION LIMITATIONS: interpersonal relationship and community activity  PERSONAL FACTORS: Time since onset of injury/illness/exacerbation and 1 comorbidity: medical history  are also affecting patient's functional outcome.   REHAB POTENTIAL: Good  CLINICAL DECISION MAKING: Stable/uncomplicated  EVALUATION COMPLEXITY: Low   GOALS: Goals reviewed with patient? Yes  SHORT TERM GOALS: Target date: 07/19/23 *all STG met 07/19/23  Pt to be I with HEP.   Baseline: Goal status: MET  2.  Pt to be I with abdominal massage for improved peristalsis for  bowel regularity.  Baseline:  Goal status: MET  3.  Pt to be I with voiding mechanics for improved bowel emptying without straining to decreased stress at pelvic floor. Baseline:  Goal status: MET   LONG TERM GOALS: Target date: 08/19/23  Pt to be I with advanced HEP.  Baseline:  Goal status: on going  2.  Pt to demonstrate minimal to no restrictions in abdominal quadrants due to improved mobility and decreased tension at bowels.  Baseline:  Goal status: on going  3.  Pt to demonstrate full range of mobility at trunk in all directions for decreased strain  at pelvic floor.  Baseline:  Goal status: on going  4.  Pt will report her BMs are complete due to improved bowel habits and evacuation techniques at least 75% of the time.  Baseline:  Goal status: on going  5.  Pt to demonstrate at least 4/5 pelvic floor strength and isometric for at least 45s rectally for improved pelvic stability and decreased strain at pelvic floor.  Baseline:  Goal status: on going   PLAN:  PT FREQUENCY: 1-2x/week  PT DURATION:  10 sessions  PLANNED INTERVENTIONS: 97110-Therapeutic exercises, 97530- Therapeutic activity, [id number]- Neuromuscular re-education, 97535- Self Care, and 96045- Manual therapy  PLAN FOR NEXT SESSION: internal if needed and pt consents, manual at abdomen, breathing and voiding mechanics, spine/hip/pelvic floor stretching  Otelia Sergeant, PT, DPT 03/31/259:48 AM

## 2023-07-31 ENCOUNTER — Encounter: Payer: BLUE CROSS/BLUE SHIELD | Attending: Internal Medicine | Admitting: Skilled Nursing Facility1

## 2023-07-31 ENCOUNTER — Ambulatory Visit: Payer: Self-pay | Attending: Internal Medicine | Admitting: Physical Therapy

## 2023-07-31 ENCOUNTER — Encounter: Payer: Self-pay | Admitting: Skilled Nursing Facility1

## 2023-07-31 VITALS — Wt 105.6 lb

## 2023-07-31 DIAGNOSIS — E441 Mild protein-calorie malnutrition: Secondary | ICD-10-CM | POA: Insufficient documentation

## 2023-07-31 DIAGNOSIS — Z681 Body mass index (BMI) 19 or less, adult: Secondary | ICD-10-CM | POA: Insufficient documentation

## 2023-07-31 DIAGNOSIS — R293 Abnormal posture: Secondary | ICD-10-CM | POA: Diagnosis not present

## 2023-07-31 DIAGNOSIS — M6281 Muscle weakness (generalized): Secondary | ICD-10-CM | POA: Insufficient documentation

## 2023-07-31 DIAGNOSIS — Z713 Dietary counseling and surveillance: Secondary | ICD-10-CM | POA: Diagnosis not present

## 2023-07-31 DIAGNOSIS — R279 Unspecified lack of coordination: Secondary | ICD-10-CM | POA: Insufficient documentation

## 2023-07-31 NOTE — Progress Notes (Signed)
 Medical Nutrition Therapy  Appointment Start time:  8:30  Appointment End time:  9:48  Primary concerns today: Eating with previous SBO  Referral diagnosis: e44.1   NUTRITION ASSESSMENT    Clinical Medical Hx: colostomy that has been reversed, SBO 2 times,  Medications: colace daily, mirilax daily,  Labs:  Notable Signs/Symptoms: none reported   Lifestyle & Dietary Hx  November left hip replaced, January SBO then another one in February. Pt states she started adding potatoes and cottage cheese.  Pt states she has been doing visceral PT and has been doing her exercises at home which has really helped.  Pt is very active and has a great attitude about her GI disturbances.   Pt goal is to get to 115 pounds, with addition of fats discussed and fishes this is expected within the next 4-6 weeks.   Estimated daily fluid intake:  oz Supplements:  Sleep:  Stress / self-care: very low stress Current average weekly physical activity: walking 5000-10000 steps per day, 15 minute of weight exercises and stretching, Golfs   24-Hr Dietary Recall: 2 boosts a day First Meal:  Snack: frozen yogurt Second Meal: yogurt, baby food, appelsauce  Snack: half avocado, 4 ounce cottage cheese, pudding  Third Meal: 2 scrambled egg + butter/olive oil Snack:  Beverages: 16 ounce water glass with probiotic pill   NUTRITION INTERVENTION  Nutrition education (E-1) on the following topics:  Educated pt on insoluble verses soluble fiber Educated pt on meal creation for weight gain without increase risks of a SBO event  PES: Imbalanced nutrition: less than body requirements related to small bowel obstruction as evidenced by unintentional weight loss, decreased appetite, and reduced food intake.  Learning Style & Readiness for Change Teaching method utilized: Visual & Auditory  Demonstrated degree of understanding via: Teach Back  Barriers to learning/adherence to lifestyle change: none   Goals  Established by Pt Ingredients 1 cup organic blueberries or just blueberry syrup 3 cups of coconut cream, divided 1 tablespoon vanilla extract 1 tablespoon sugar  cup organic raspberries or just syrup Instructions Get 6 popsicle molds from dollar store and fill the bottom with some of the blueberrie syrup, dividing them evenly amongst the 6. In a food processor blend 2 cups of room temperature coconut cream with the vanilla and the [place]. Process until smooth, then pour into the molds, just enough to cover the blueberries by about  inch. Set aside the rest at room temperature so it does not separate. If it starts separating, just warm up briefly and blend again. Stick the popsicle sticks in to the molds and place the molds in the freezer until the mixture is almost solid, about 1 hour. Mix the remaining cup of coconut cream and the raspberries in the food processor to create a smooth cream. Pour about  inch of red mixture in the molds and freeze again for an hour. Next pour another layer of white cream and freeze. You can continue with alternate red and white stripes until the top of the mold. Now freeze until solid, for at least 3 hours. When ready to eat take out of the freezer and run some hot water over the molds for about 20 to 45 seconds, until you are able to free the popsicle! Start adding in solid foods again continue to cook soft and chew well things like banana, salmon, soft cooked veggies, canned peaches/pears, full fat yogurt, white rice Try cream cheese in your eggs  After a month of adding  solid foods without any symptoms try a couple blueberries  Try Just Egg if you get bored with eggs   MONITORING & EVALUATION Dietary intake, weekly physical activity  Next Steps  Patient is to call or email with any future questions or concerns.

## 2023-07-31 NOTE — Therapy (Signed)
 OUTPATIENT PHYSICAL THERAPY FEMALE PELVIC TREATMENT   Patient Name: Alyssa Robinson MRN: [id number] DOB:1950-08-12, 73 y.o., female Today's Date: 07/31/2023  END OF SESSION:  PT End of Session - 07/31/23 1403     Visit Number 7    Date for PT Re-Evaluation 08/19/23    Authorization Type MCR    PT Start Time 1400    PT Stop Time 1441    PT Time Calculation (min) 41 min    Activity Tolerance Patient tolerated treatment well    Behavior During Therapy WFL for tasks assessed/performed              Past Medical History:  Diagnosis Date   Allergy 20-25 years ago   Anemia    hx   Arthritis 8-10 years ago   Blood transfusion without reported diagnosis 20 years ago   Bronchitis    Bronchitis    hx   Closed patellar sleeve fracture of left knee    Diverticulosis    GERD (gastroesophageal reflux disease)    occ   Headache(784.0)    migraines occ   Hypertension    no meds in over 70yrs   Pneumonia    Seasonal allergies    Past Surgical History:  Procedure Laterality Date   ABDOMINAL HYSTERECTOMY     73 y/o   APPENDECTOMY  03/17/2013   Procedure: INCIDENTAL APPENDECTOMY;  Surgeon: Cherylynn Ridges, MD;  Location: [facility] OR;  Service: General;;   capsule endoscopy     COLON SURGERY     COLONOSCOPY     in 2012 -    COLOSTOMY CLOSURE  03/17/2013   COLOSTOMY REVERSAL  03/17/2013   Procedure: COLOSTOMY REVERSAL;  Surgeon: Cherylynn Ridges, MD;  Location: [facility] OR;  Service: General;;   EUS N/A 04/30/2015   Procedure: UPPER ENDOSCOPIC ULTRASOUND (EUS) LINEAR;  Surgeon: Jeani Hawking, MD;  Location: [facility];  Service: Endoscopy;  Laterality: N/A;   HERNIA REPAIR     X2 2-4 y/o inguinal   JOINT REPLACEMENT  ORID Knee/right hip   2021/2022   LAPAROTOMY N/A 10/17/2012   Procedure: EXPLORATORY LAPAROTOMY,  COLON RESECTION AND COLOSTOMY;  Surgeon: Cherylynn Ridges, MD;  Location: [facility] OR;  Service: General;  Laterality: N/A;   LIPOMA EXCISION Left    left arm   LYSIS OF ADHESION   03/17/2013   Procedure: LYSIS OF ADHESION;  Surgeon: Cherylynn Ridges, MD;  Location: [facility] OR;  Service: General;;   ORIF PATELLA Left 02/20/2020   Procedure: OPEN REDUCTION INTERNAL (ORIF) FIXATION LEFT PATELLA;  Surgeon: Tarry Kos, MD;  Location: [facility];  Service: Orthopedics;  Laterality: Left;   SMALL INTESTINE SURGERY  Perforated colon   2014   TOTAL HIP ARTHROPLASTY Right 09/12/2021   Procedure: RIGHT TOTAL HIP ARTHROPLASTY ANTERIOR APPROACH;  Surgeon: Tarry Kos, MD;  Location: [facility] OR;  Service: Orthopedics;  Laterality: Right;  3-C   TOTAL HIP ARTHROPLASTY Left 02/23/2023   Procedure: LEFT TOTAL HIP ARTHROPLASTY ANTERIOR APPROACH;  Surgeon: Tarry Kos, MD;  Location: [facility] OR;  Service: Orthopedics;  Laterality: Left;  3-C   UPPER GASTROINTESTINAL ENDOSCOPY     2013 -   Patient Active Problem List   Diagnosis Date Noted   Mild protein-calorie malnutrition (HCC) 06/05/2023   Anogenital candidiasis in female 06/04/2023   SBO (small bowel obstruction) (HCC) 05/08/2023   Status post total replacement of left hip 02/23/2023   History of colonic polyps 02/03/2022   Family history  of early CAD 07/02/2020   Lipoma of abdominal wall 08/04/2019   Osteopenia 01/28/2019   Thrombocytopenia (HCC) 10/17/2017   Vaginal atrophy 06/01/2015   Cyst of pancreas 03/06/2015   Glaucoma suspect of both eyes 03/11/2014   Nuclear sclerosis of both eyes 03/11/2014   Routine general medical examination at a health care facility 12/11/2012   Hyperglycemia 12/11/2012   Essential hypertension, benign 12/11/2012    PCP: Myrlene Broker, MD   REFERRING PROVIDER: Myrlene Broker, MD   REFERRING DIAG: [phone] (ICD-10-CM) - SBO (small bowel obstruction) (HCC)  THERAPY DIAG:  Muscle weakness (generalized)  Unspecified lack of coordination  Rationale for Evaluation and Treatment: Rehabilitation  ONSET DATE: chronic  SUBJECTIVE:                                                                                                                                                                                            SUBJECTIVE STATEMENT: Has been doing abdominal massage and thinks this is helping bowel movement be more regular and is now going fully in the morning (first stool is "best") and then has 2 more usually by the end of the day. Doesn't need to strain. Type 4 and larger now.   Did see nutritionist today and they had positive response to pt progress.   Fluid intake: 12oz water in am x2, boost, orange juice, 2 more glasses of water in afternoon, and one at dinner - is eating a lot of broths as well  PAIN:  Are you having pain? No   PRECAUTIONS: None  RED FLAGS: None   WEIGHT BEARING RESTRICTIONS: No  FALLS:  Has patient fallen in last 6 months? 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  ACTIVITY LEVEL : walking every day 5000-8000 steps daily; and active with friends   PLOF: Independent  PATIENT GOALS: to have more regular bowels  PERTINENT HISTORY:  HYSTERECTOMY, COLOSTOMY REVERSAL, HERNIA REPAIR x2 at 73 yo, Rt hip replacement Sexual abuse: No  BOWEL MOVEMENT: Pain with bowel movement: Yes sometimes Type of bowel movement:Type (Bristol Stool Scale) 1, Frequency usually once in the morning, and Strain yes  Fully empty rectum: No Leakage: No Pads: No Fiber supplement/laxative colace (2x daily)  URINATION: Pain with urination: Yes not often but sometimes pressure/tightness at abdomen Fully empty bladder: No Stream: Strong and Weak Urgency: No Frequency: no Leakage: Laughing Pads: No  INTERCOURSE:  Ability to have vaginal penetration Yes  Pain with intercourse: Initial Penetration DrynessYes  Climax: hard to achieve  Marinoff Scale: 0/3  PREGNANCY: Vaginal deliveries 0 Tearing No Episiotomy No C-section  deliveries 0 Currently pregnant No  PROLAPSE: None   OBJECTIVE:  Note:  Objective measures were completed at Evaluation unless otherwise noted.  DIAGNOSTIC FINDINGS:    COGNITION: Overall cognitive status: Within functional limits for tasks assessed     SENSATION: Light touch: Appears intact  GAIT: WFL  POSTURE: rounded shoulders and posterior pelvic tilt   LUMBARAROM/PROM:  A/PROM A/PROM  eval  Flexion WFL  Extension WFL  Right lateral flexion Limited by 25%  Left lateral flexion Limited by 25%  Right rotation Limited by 25%  Left rotation Limited by 25%   (Blank rows = not tested)  LOWER EXTREMITY ROM:  WFL   LOWER EXTREMITY MMT:  WFL  PALPATION:   General: tightness in bil piriformis and lumbar paraspinals   Pelvic Alignment: WFL  Abdominal: several scars throughout abdominal quadrants with restrictions in all directions but no pain reports feeling tight                External Perineal Exam: no TTP                              Internal Pelvic Floor: no TTP, tightness throughout  Patient confirms identification and approves PT to assess internal pelvic floor and treatment Yes No emotional/communication barriers or cognitive limitation. Patient is motivated to learn. Patient understands and agrees with treatment goals and plan. PT explains patient will be examined in standing, sitting, and lying down to see how their muscles and joints work. When they are ready, they will be asked to remove their underwear so PT can examine their perineum. The patient is also given the option of providing their own chaperone as one is not provided in our facility. The patient also has the right and is explained the right to defer or refuse any part of the evaluation or treatment including the internal exam. With the patient's consent, PT will use one gloved finger to gently assess the muscles of the pelvic floor, seeing how well it contracts and relaxes and if there is muscle symmetry. After, the patient will get dressed and PT and patient will discuss  exam findings and plan of care. PT and patient discuss plan of care, schedule, attendance policy and HEP activities.   PELVIC MMT:   MMT eval  Vaginal   Internal Anal Sphincter 3/5  External Anal Sphincter 4/5, 3s (3/5 additional 20s), 7 reps  Puborectalis   Diastasis Recti   (Blank rows = not tested)        TONE: Slightly decreased   PROLAPSE: Not seen in rectal assessment   TODAY'S TREATMENT:                                                                                                                              DATE:   07/09/23: Patient consented to internal pelvic floor treatment rectally this date and found to have continued tension  at superficial and deep pelvic floor layers but not painful. Pt reports she does feel the tightness.. Manual completed bil with good effect with gentle stretching tolerated well.  Pt reports feeling more relaxed afterward. Pt had questions regarding GI appointment recently with now able to eat soluble foods, pt did receive handout from GI discussing this and PT encouraged pt to follow those recommendations as MD set them. Pt agreed.   07/19/23:  Vibration plate [id number] sitting and 2x30s standing low setting for tissue mobility and relaxation Single knee chest 3x30s >double knee to chest 2x30s Piriformis stretch 3x30s Butterfly stretch 3x30s Lumbar rotation stretching x10 5s each Seated pelvic tilts x10  07/23/23: Vibration plate [id number] standing, 2x90min sitting low setting for tissue mobility and relaxation for improved bowel evacuation Manual at abdomen for scar massage and colon abdominal massage with fascial release direct techniques, ILU, "m" techniques, and CCW/CW with noted improvement with mobility at end of session   07/31/23: Prone lying 2 min stretch cues for relaxation and decreased tension at abdomen and pelvis  Vibration plate 2x1 min standing Manual - abdomen for scar massage and colon abdominal massage with fascial release direct  techniques, with noted improvement with mobility at end of session  Rt external oblique manual work completed with tightness noted here as well and improved mobility noted with gentle stretching.    PATIENT EDUCATION:  Education details: abdominal massage, voiding mechanics, [id number] Person educated: Patient Education method: Explanation, Demonstration, Tactile cues, Verbal cues, and Handouts Education comprehension: verbalized understanding, returned demonstration, verbal cues required, tactile cues required, and needs further education  HOME EXERCISE PROGRAM: [id number]  ASSESSMENT:  CLINICAL IMPRESSION: Patient is a 73 y.o. female  who was seen today for physical therapy evaluation and treatment for decreased bowel regularity however today pt states she has been having great improvement. Pt has continued to improvement with symptoms. Due to medical history of several bowel diagnoses would benefit from additional PT to address deficits.        OBJECTIVE IMPAIRMENTS: decreased activity tolerance, decreased coordination, decreased endurance, decreased mobility, decreased strength, increased fascial restrictions, increased muscle spasms, impaired flexibility, postural dysfunction, and pain.   ACTIVITY LIMITATIONS: continence  PARTICIPATION LIMITATIONS: interpersonal relationship and community activity  PERSONAL FACTORS: Time since onset of injury/illness/exacerbation and 1 comorbidity: medical history  are also affecting patient's functional outcome.   REHAB POTENTIAL: Good  CLINICAL DECISION MAKING: Stable/uncomplicated  EVALUATION COMPLEXITY: Low   GOALS: Goals reviewed with patient? Yes  SHORT TERM GOALS: Target date: 07/19/23 *all STG met 07/19/23  Pt to be I with HEP.  Baseline: Goal status: MET  2.  Pt to be I with abdominal massage for improved peristalsis for  bowel regularity.  Baseline:  Goal status: MET  3.  Pt to be I with voiding mechanics for improved bowel  emptying without straining to decreased stress at pelvic floor. Baseline:  Goal status: MET   LONG TERM GOALS: Target date: 08/19/23  Pt to be I with advanced HEP.  Baseline:  Goal status: MET  2.  Pt to demonstrate minimal to no restrictions in abdominal quadrants due to improved mobility and decreased tension at bowels.  Baseline:  Goal status: on going  3.  Pt to demonstrate full range of mobility at trunk in all directions for decreased strain at pelvic floor.  Baseline:  Goal status: on going  4.  Pt will report her BMs are complete due to improved bowel habits and evacuation techniques at least 75% of the  time.  Baseline:  Goal status: on going  5.  Pt to demonstrate at least 4/5 pelvic floor strength and isometric for at least 45s rectally for improved pelvic stability and decreased strain at pelvic floor.  Baseline:  Goal status: on going   PLAN:  PT FREQUENCY: 1-2x/week  PT DURATION:  10 sessions  PLANNED INTERVENTIONS: 97110-Therapeutic exercises, 97530- Therapeutic activity, [id number]- Neuromuscular re-education, 97535- Self Care, and [id number]- Manual therapy  PLAN FOR NEXT SESSION: internal if needed and pt consents, manual at abdomen, breathing and voiding mechanics, spine/hip/pelvic floor stretching  Otelia Sergeant, PT, DPT 04/08/254:19 PM

## 2023-08-07 ENCOUNTER — Encounter: Payer: Self-pay | Admitting: Physical Therapy

## 2023-08-09 ENCOUNTER — Encounter: Admitting: Physical Therapy

## 2023-08-09 ENCOUNTER — Encounter: Payer: Self-pay | Admitting: Physical Therapy

## 2023-08-14 ENCOUNTER — Ambulatory Visit: Payer: BLUE CROSS/BLUE SHIELD | Admitting: Physical Therapy

## 2023-08-14 DIAGNOSIS — M6281 Muscle weakness (generalized): Secondary | ICD-10-CM

## 2023-08-14 DIAGNOSIS — R279 Unspecified lack of coordination: Secondary | ICD-10-CM | POA: Diagnosis not present

## 2023-08-14 DIAGNOSIS — R293 Abnormal posture: Secondary | ICD-10-CM | POA: Diagnosis not present

## 2023-08-14 NOTE — Therapy (Signed)
 OUTPATIENT PHYSICAL THERAPY FEMALE PELVIC TREATMENT   Patient Name: Alyssa Robinson MRN: [id number] DOB:10-11-1950, 73 y.o., female Today's Date: 08/14/2023  Progress Note Reporting Period 06/21/23 to 08/14/23  See note below for Objective Data and Assessment of Progress/Goals.      END OF SESSION:  PT End of Session - 08/14/23 1405     Visit Number 8    Date for PT Re-Evaluation 09/22/23    Authorization Type MCR    Progress Note Due on Visit 18    PT Start Time 1401    PT Stop Time 1443    PT Time Calculation (min) 42 min    Activity Tolerance Patient tolerated treatment well    Behavior During Therapy WFL for tasks assessed/performed               Past Medical History:  Diagnosis Date   Allergy 20-25 years ago   Anemia    hx   Arthritis 8-10 years ago   Blood transfusion without reported diagnosis 20 years ago   Bronchitis    Bronchitis    hx   Closed patellar sleeve fracture of left knee    Diverticulosis    GERD (gastroesophageal reflux disease)    occ   Headache(784.0)    migraines occ   Hypertension    no meds in over 72yrs   Pneumonia    Seasonal allergies    Past Surgical History:  Procedure Laterality Date   ABDOMINAL HYSTERECTOMY     73 y/o   APPENDECTOMY  03/17/2013   Procedure: INCIDENTAL APPENDECTOMY;  Surgeon: Diantha Fossa, MD;  Location: [facility] OR;  Service: General;;   capsule endoscopy     COLON SURGERY     COLONOSCOPY     in 2012 -    COLOSTOMY CLOSURE  03/17/2013   COLOSTOMY REVERSAL  03/17/2013   Procedure: COLOSTOMY REVERSAL;  Surgeon: Diantha Fossa, MD;  Location: [facility] OR;  Service: General;;   EUS N/A 04/30/2015   Procedure: UPPER ENDOSCOPIC ULTRASOUND (EUS) LINEAR;  Surgeon: Alvis Jourdain, MD;  Location: [facility];  Service: Endoscopy;  Laterality: N/A;   HERNIA REPAIR     X2 2-4 y/o inguinal   JOINT REPLACEMENT  ORID Knee/right hip   2021/2022   LAPAROTOMY N/A 10/17/2012   Procedure: EXPLORATORY LAPAROTOMY,  COLON  RESECTION AND COLOSTOMY;  Surgeon: Diantha Fossa, MD;  Location: [facility] OR;  Service: General;  Laterality: N/A;   LIPOMA EXCISION Left    left arm   LYSIS OF ADHESION  03/17/2013   Procedure: LYSIS OF ADHESION;  Surgeon: Diantha Fossa, MD;  Location: [facility] OR;  Service: General;;   ORIF PATELLA Left 02/20/2020   Procedure: OPEN REDUCTION INTERNAL (ORIF) FIXATION LEFT PATELLA;  Surgeon: Wes Hamman, MD;  Location: [facility];  Service: Orthopedics;  Laterality: Left;   SMALL INTESTINE SURGERY  Perforated colon   2014   TOTAL HIP ARTHROPLASTY Right 09/12/2021   Procedure: RIGHT TOTAL HIP ARTHROPLASTY ANTERIOR APPROACH;  Surgeon: Wes Hamman, MD;  Location: [facility] OR;  Service: Orthopedics;  Laterality: Right;  3-C   TOTAL HIP ARTHROPLASTY Left 02/23/2023   Procedure: LEFT TOTAL HIP ARTHROPLASTY ANTERIOR APPROACH;  Surgeon: Wes Hamman, MD;  Location: [facility] OR;  Service: Orthopedics;  Laterality: Left;  3-C   UPPER GASTROINTESTINAL ENDOSCOPY     2013 -   Patient Active Problem List   Diagnosis Date Noted   Mild protein-calorie malnutrition (HCC) 06/05/2023   Anogenital  candidiasis in female 06/04/2023   SBO (small bowel obstruction) (HCC) 05/08/2023   Status post total replacement of left hip 02/23/2023   History of colonic polyps 02/03/2022   Family history of early CAD 07/02/2020   Lipoma of abdominal wall 08/04/2019   Osteopenia 01/28/2019   Thrombocytopenia (HCC) 10/17/2017   Vaginal atrophy 06/01/2015   Cyst of pancreas 03/06/2015   Glaucoma suspect of both eyes 03/11/2014   Nuclear sclerosis of both eyes 03/11/2014   Routine general medical examination at a health care facility 12/11/2012   Hyperglycemia 12/11/2012   Essential hypertension, benign 12/11/2012    PCP: Adelia Homestead, MD   REFERRING PROVIDER: Adelia Homestead, MD   REFERRING DIAG: [id number] (ICD-10-CM) - SBO (small bowel obstruction) (HCC)  THERAPY DIAG:  Muscle weakness  (generalized)  Unspecified lack of coordination  Abnormal posture  Rationale for Evaluation and Treatment: Rehabilitation  ONSET DATE: chronic  SUBJECTIVE:                                                                                                                                                                                           SUBJECTIVE STATEMENT: Traveled for Easter, ate soft foods and pureed foods and hasn't had any return of constipation. Is now taking 2 teaspoons of miralax  daily and has a bowel movement large every morning, a small bowel movement in the mid day and a medium bowel movement in the evening sometimes. Reports this is very close to her normal.    Fluid intake: 12oz water in am x2, boost, orange juice, 2 more glasses of water in afternoon, and one at dinner - is eating a lot of broths as well  PAIN:  Are you having pain? No   PRECAUTIONS: None  RED FLAGS: None   WEIGHT BEARING RESTRICTIONS: No  FALLS:  Has patient fallen in last 6 months? 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  ACTIVITY LEVEL : walking every day 5000-8000 steps daily; and active with friends   PLOF: Independent  PATIENT GOALS: to have more regular bowels  PERTINENT HISTORY:  HYSTERECTOMY, COLOSTOMY REVERSAL, HERNIA REPAIR x2 at 73 yo, Rt hip replacement Sexual abuse: No  BOWEL MOVEMENT: Pain with bowel movement: Yes sometimes Type of bowel movement:Type (Bristol Stool Scale) 1, Frequency usually once in the morning, and Strain yes  Fully empty rectum: No Leakage: No Pads: No Fiber supplement/laxative colace (2x daily)  URINATION: Pain with urination: Yes not often but sometimes pressure/tightness at abdomen Fully empty bladder: No Stream: Strong and Weak Urgency: No Frequency: no Leakage: Laughing Pads: No  INTERCOURSE:  Ability to have vaginal penetration Yes  Pain with intercourse: Initial Penetration DrynessYes   Climax: hard to achieve  Marinoff Scale: 0/3  PREGNANCY: Vaginal deliveries 0 Tearing No Episiotomy No C-section deliveries 0 Currently pregnant No  PROLAPSE: None   OBJECTIVE:  Note: Objective measures were completed at Evaluation unless otherwise noted.  DIAGNOSTIC FINDINGS:    COGNITION: Overall cognitive status: Within functional limits for tasks assessed     SENSATION: Light touch: Appears intact  GAIT: WFL  POSTURE: rounded shoulders and posterior pelvic tilt   LUMBARAROM/PROM:  A/PROM A/PROM  eval  Flexion WFL  Extension WFL  Right lateral flexion Limited by 25%  Left lateral flexion Limited by 25%  Right rotation Limited by 25%  Left rotation Limited by 25%   (Blank rows = not tested)  LOWER EXTREMITY ROM:  WFL   LOWER EXTREMITY MMT:  WFL  PALPATION:   General: tightness in bil piriformis and lumbar paraspinals   Pelvic Alignment: WFL  Abdominal: several scars throughout abdominal quadrants with restrictions in all directions but no pain reports feeling tight                External Perineal Exam: no TTP                              Internal Pelvic Floor: no TTP, tightness throughout  Patient confirms identification and approves PT to assess internal pelvic floor and treatment Yes No emotional/communication barriers or cognitive limitation. Patient is motivated to learn. Patient understands and agrees with treatment goals and plan. PT explains patient will be examined in standing, sitting, and lying down to see how their muscles and joints work. When they are ready, they will be asked to remove their underwear so PT can examine their perineum. The patient is also given the option of providing their own chaperone as one is not provided in our facility. The patient also has the right and is explained the right to defer or refuse any part of the evaluation or treatment including the internal exam. With the patient's consent, PT will use one gloved  finger to gently assess the muscles of the pelvic floor, seeing how well it contracts and relaxes and if there is muscle symmetry. After, the patient will get dressed and PT and patient will discuss exam findings and plan of care. PT and patient discuss plan of care, schedule, attendance policy and HEP activities.   PELVIC MMT:   MMT eval 08/14/23  Vaginal    Internal Anal Sphincter 3/5 4/5  External Anal Sphincter 4/5, 3s (3/5 additional 20s), 7 reps 4/5, 45s, 8 reps  Puborectalis    Diastasis Recti    (Blank rows = not tested)        TONE: Slightly decreased   PROLAPSE: Not seen in rectal assessment   TODAY'S TREATMENT:  DATE:    07/23/23: Vibration plate [id number] standing, 2x34min sitting low setting for tissue mobility and relaxation for improved bowel evacuation Manual at abdomen for scar massage and colon abdominal massage with fascial release direct techniques, ILU, "m" techniques, and CCW/CW with noted improvement with mobility at end of session   07/31/23: Prone lying 2 min stretch cues for relaxation and decreased tension at abdomen and pelvis  Vibration plate 2x1 min standing Manual - abdomen for scar massage and colon abdominal massage with fascial release direct techniques, with noted improvement with mobility at end of session  Rt external oblique manual work completed with tightness noted here as well and improved mobility noted with gentle stretching.   08/14/23 Patient consented to internal pelvic floor assessment rectally this date and found to have improved strength, coordination, and endurance overall. And demonstrates good techniques with contract/relax/bulge. Denied pain.  2x10 pelvic floor contractions, x10 isometrics up to 45s Pt educated on continued abdominal massage, voiding mechanics, and urge drill as needed for decreased urgency with  bowels or bladder to improve confidence with getting to the bathroom without fear of leakage.   PATIENT EDUCATION:  Education details: abdominal massage, voiding mechanics, [id number] Person educated: Patient Education method: Explanation, Demonstration, Tactile cues, Verbal cues, and Handouts Education comprehension: verbalized understanding, returned demonstration, verbal cues required, tactile cues required, and needs further education  HOME EXERCISE PROGRAM: [id number]  ASSESSMENT:  CLINICAL IMPRESSION: Patient is a 73 y.o. female  who was seen today for physical therapy evaluation and treatment for decreased bowel regularity however today pt states she has been having great improvement. Pt has continued to improvement with symptoms. Would benefit from one additional visit to allow pt time to self monitor symptoms and ensure no regression of symptoms with changes/progress in diet and mobility. Due to medical history of several bowel diagnoses would benefit from additional PT to address deficits.        OBJECTIVE IMPAIRMENTS: decreased activity tolerance, decreased coordination, decreased endurance, decreased mobility, decreased strength, increased fascial restrictions, increased muscle spasms, impaired flexibility, postural dysfunction, and pain.   ACTIVITY LIMITATIONS: continence  PARTICIPATION LIMITATIONS: interpersonal relationship and community activity  PERSONAL FACTORS: Time since onset of injury/illness/exacerbation and 1 comorbidity: medical history  are also affecting patient's functional outcome.   REHAB POTENTIAL: Good  CLINICAL DECISION MAKING: Stable/uncomplicated  EVALUATION COMPLEXITY: Low   GOALS: Goals reviewed with patient? Yes  SHORT TERM GOALS: Target date: 07/19/23 *all STG met 07/19/23  Pt to be I with HEP.  Baseline: Goal status: MET  2.  Pt to be I with abdominal massage for improved peristalsis for  bowel regularity.  Baseline:  Goal status: MET  3.   Pt to be I with voiding mechanics for improved bowel emptying without straining to decreased stress at pelvic floor. Baseline:  Goal status: MET   LONG TERM GOALS: Target date: 08/19/23  Pt to be I with advanced HEP.  Baseline:  Goal status: MET  2.  Pt to demonstrate minimal to no restrictions in abdominal quadrants due to improved mobility and decreased tension at bowels.  Baseline:  Goal status: on going  3.  Pt to demonstrate full range of mobility at trunk in all directions for decreased strain at pelvic floor.  Baseline:  Goal status: MET  4.  Pt will report her BMs are complete due to improved bowel habits and evacuation techniques at least 75% of the time.  Baseline:  Goal status: MET  5.  Pt to demonstrate at  least 4/5 pelvic floor strength and isometric for at least 45s rectally for improved pelvic stability and decreased strain at pelvic floor.  Baseline:  Goal status: MET  6. Pt to report at least 6 weeks of no constipation or straining with bowel movement to improve bowel regularity and decrease risk of stool burden for improved bowel health and improved confidence with community outings.  Baseline:  Goal status: NEW   PLAN:  PT FREQUENCY: 1-2x/week  PT DURATION:  10 sessions  PLANNED INTERVENTIONS: 97110-Therapeutic exercises, 97530- Therapeutic activity, [id number]- Neuromuscular re-education, 97535- Self Care, and 16109- Manual therapy  PLAN FOR NEXT SESSION:  manual at abdomen, breathing and voiding mechanics, spine/hip/pelvic floor stretching  Avie Lemme, PT, DPT 04/22/252:46 PM

## 2023-08-14 NOTE — Addendum Note (Signed)
 Addended by: Lang Pipes on: 08/14/2023 08:30 PM   Modules accepted: Orders

## 2023-08-14 NOTE — Patient Instructions (Signed)

## 2023-08-16 ENCOUNTER — Other Ambulatory Visit (INDEPENDENT_AMBULATORY_CARE_PROVIDER_SITE_OTHER): Payer: Self-pay

## 2023-08-16 ENCOUNTER — Ambulatory Visit: Payer: BLUE CROSS/BLUE SHIELD | Admitting: Orthopaedic Surgery

## 2023-08-16 DIAGNOSIS — Z96642 Presence of left artificial hip joint: Secondary | ICD-10-CM | POA: Diagnosis not present

## 2023-08-16 NOTE — Progress Notes (Signed)
 Post-Op Visit Note   Patient: Alyssa Robinson           Date of Birth: [birth year]           MRN: [id number] Visit Date: 08/16/2023 PCP: Adelia Homestead, MD   Assessment & Plan:  Chief Complaint:  Chief Complaint  Patient presents with   Left Hip - Pain   Visit Diagnoses:  1. Status post total replacement of left hip     Plan: Evertt Hoe is 73 years old status post left total hip arthroplasty. status post left total hip arthroplasty.  She has resumed all of her activities.  Only reports numbness to the lateral thigh.  She has no complaints otherwise.  Exam shows well-healed surgical scar.  Fluid painless range of motion.  Normal gait pattern.  Implant is stable x-rays.  Continue dental prophylaxis.  Activity as tolerated.  Recheck in 6 months with repeat radiographs.  Follow-Up Instructions: Return in about 6 months (around 02/15/2024).   Orders:  Orders Placed This Encounter  Procedures   XR Pelvis 1-2 Views   No orders of the defined types were placed in this encounter.   Imaging: XR Pelvis 1-2 Views Result Date: 08/16/2023 X-rays of the pelvis show bilateral total hip arthroplasties without complications   PMFS History: Patient Active Problem List   Diagnosis Date Noted   Mild protein-calorie malnutrition (HCC) 06/05/2023   Anogenital candidiasis in female 06/04/2023   SBO (small bowel obstruction) (HCC) 05/08/2023   Status post total replacement of left hip 02/23/2023   History of colonic polyps 02/03/2022   Family history of early CAD 07/02/2020   Lipoma of abdominal wall 08/04/2019   Osteopenia 01/28/2019   Thrombocytopenia (HCC) 10/17/2017   Vaginal atrophy 06/01/2015   Cyst of pancreas 03/06/2015   Glaucoma suspect of both eyes 03/11/2014   Nuclear sclerosis of both eyes 03/11/2014   Routine general medical examination at a health care facility 12/11/2012   Hyperglycemia 12/11/2012   Essential hypertension, benign 12/11/2012   Past Medical History:  Diagnosis Date   Allergy 20-25 years ago    Anemia    hx   Arthritis 8-10 years ago   Blood transfusion without reported diagnosis 20 years ago   Bronchitis    Bronchitis    hx   Closed patellar sleeve fracture of left knee    Diverticulosis    GERD (gastroesophageal reflux disease)    occ   Headache(784.0)    migraines occ   Hypertension    no meds in over 5yrs   Pneumonia    Seasonal allergies     Family History  Problem Relation Age of Onset   Esophageal cancer Mother        and ovarian cancer   Alcohol abuse Mother    Arthritis Mother    Cancer Mother    Depression Mother    Heart disease Father    Alcohol abuse Father    Hypertension Father    Diabetes Father    Early death Father    Breast cancer Sister    Colon cancer Other    Pancreatic cancer Other    Cancer Maternal Aunt    Cancer Maternal Aunt    Stroke Neg Hx    Hyperlipidemia Neg Hx    Kidney disease Neg Hx    COPD Neg Hx     Past Surgical History:  Procedure Laterality Date   ABDOMINAL HYSTERECTOMY     73 y/o   APPENDECTOMY  03/17/2013   Procedure: INCIDENTAL APPENDECTOMY;  Surgeon:  Diantha Fossa, MD;  Location: [facility] OR;  Service: General;;   capsule endoscopy     COLON SURGERY     COLONOSCOPY     in 2012 -    COLOSTOMY CLOSURE  03/17/2013   COLOSTOMY REVERSAL  03/17/2013   Procedure: COLOSTOMY REVERSAL;  Surgeon: Diantha Fossa, MD;  Location: [facility] OR;  Service: General;;   EUS N/A 04/30/2015   Procedure: UPPER ENDOSCOPIC ULTRASOUND (EUS) LINEAR;  Surgeon: Alvis Jourdain, MD;  Location: [facility];  Service: Endoscopy;  Laterality: N/A;   HERNIA REPAIR     X2 2-4 y/o inguinal   JOINT REPLACEMENT  ORID Knee/right hip   2021/2022   LAPAROTOMY N/A 10/17/2012   Procedure: EXPLORATORY LAPAROTOMY,  COLON RESECTION AND COLOSTOMY;  Surgeon: Diantha Fossa, MD;  Location: [facility] OR;  Service: General;  Laterality: N/A;   LIPOMA EXCISION Left    left arm   LYSIS OF ADHESION  03/17/2013   Procedure: LYSIS OF ADHESION;  Surgeon: Diantha Fossa, MD;   Location: [facility];  Service: General;;   ORIF PATELLA Left 02/20/2020   Procedure: OPEN REDUCTION INTERNAL (ORIF) FIXATION LEFT PATELLA;  Surgeon: Wes Hamman, MD;  Location: [facility];  Service: Orthopedics;  Laterality: Left;   SMALL INTESTINE SURGERY  Perforated colon   2014   TOTAL HIP ARTHROPLASTY Right 09/12/2021   Procedure: RIGHT TOTAL HIP ARTHROPLASTY ANTERIOR APPROACH;  Surgeon: Wes Hamman, MD;  Location: [facility];  Service: Orthopedics;  Laterality: Right;  3-C   TOTAL HIP ARTHROPLASTY Left 02/23/2023   Procedure: LEFT TOTAL HIP ARTHROPLASTY ANTERIOR APPROACH;  Surgeon: Wes Hamman, MD;  Location: [facility];  Service: Orthopedics;  Laterality: Left;  3-C   UPPER GASTROINTESTINAL ENDOSCOPY     2013 -   Social History   Occupational History   Not on file  Tobacco Use   Smoking status: Never    Passive exposure: Never   Smokeless tobacco: Never   Tobacco comments:    Second hand smoke with mother/father smoking  Vaping Use   Vaping status: Never Used  Substance and Sexual Activity   Alcohol use: Not Currently    Alcohol/week: 2.0 standard drinks of alcohol   Drug use: No   Sexual activity: Yes    Birth control/protection: Post-menopausal

## 2023-08-21 ENCOUNTER — Encounter: Payer: BLUE CROSS/BLUE SHIELD | Admitting: Physical Therapy

## 2023-08-28 ENCOUNTER — Encounter: Payer: BLUE CROSS/BLUE SHIELD | Admitting: Physical Therapy

## 2023-08-31 DIAGNOSIS — H109 Unspecified conjunctivitis: Secondary | ICD-10-CM | POA: Diagnosis not present

## 2023-09-04 ENCOUNTER — Encounter: Payer: BLUE CROSS/BLUE SHIELD | Admitting: Physical Therapy

## 2023-09-05 ENCOUNTER — Ambulatory Visit (INDEPENDENT_AMBULATORY_CARE_PROVIDER_SITE_OTHER): Admitting: Family Medicine

## 2023-09-05 ENCOUNTER — Encounter: Payer: Self-pay | Admitting: Family Medicine

## 2023-09-05 VITALS — BP 138/80 | HR 81 | Temp 98.7°F | Ht 62.0 in | Wt 107.6 lb

## 2023-09-05 DIAGNOSIS — N6310 Unspecified lump in the right breast, unspecified quadrant: Secondary | ICD-10-CM

## 2023-09-05 DIAGNOSIS — N6331 Unspecified lump in axillary tail of the right breast: Secondary | ICD-10-CM | POA: Diagnosis not present

## 2023-09-05 DIAGNOSIS — H1032 Unspecified acute conjunctivitis, left eye: Secondary | ICD-10-CM

## 2023-09-05 MED ORDER — NEOMYCIN-POLYMYXIN-DEXAMETH 3.5-10000-0.1 OP SUSP: 1.0000 [drp] | Freq: Four times a day (QID) | OPHTHALMIC | 0 refills | Status: AC

## 2023-09-05 NOTE — Progress Notes (Signed)
 Acute Office Visit  Subjective:     Patient ID: Alyssa Robinson, female    DOB: 1950-12-18, 73 y.o.   MRN: [id number]  Chief Complaint  Patient presents with   Acute Visit    Lump on right breast, present for a few months, recent mammogram in February.    HPI Patient is in today for evaluation of lump to the upper outer quadrant of the right breast. Had negative mammogram in February of this year. Reports that the area feels like it has grown larger, would like further evaluation. Denies any skin texture color changes, nipple discharge, other lumps or pain.  Separately reports that she was diagnosed with conjunctivitis at an urgent care and was given antibiotic eyedrops, states that she will not have enough of the medication to finish the full 10-day course that she was prescribed.  Requesting refill of this today.  ROS Per HPI      Objective:    BP 138/80 (BP Location: Left Arm, Patient Position: Sitting)   Pulse 81   Temp 98.7 F (37.1 C) (Temporal)   Ht 5\' 2"  (1.575 m)   Wt 107 lb 9.6 oz (48.8 kg)   SpO2 99%   BMI 19.68 kg/m    Physical Exam Vitals and nursing note reviewed.  Constitutional:      General: She is not in acute distress.    Appearance: Normal appearance. She is normal weight.  HENT:     Head: Normocephalic and atraumatic.     Right Ear: External ear normal.     Left Ear: External ear normal.     Nose: Nose normal.     Mouth/Throat:     Mouth: Mucous membranes are moist.     Pharynx: Oropharynx is clear.  Eyes:     General:        Left eye: Discharge and hordeolum present.    Extraocular Movements: Extraocular movements intact.     Pupils: Pupils are equal, round, and reactive to light.      Comments: Area of hordeolum, thin green discharge noted to lower left eyelashes.  Left upper lid erythematous, mildly swollen, mildly tender  Cardiovascular:     Rate and Rhythm: Normal rate and regular rhythm.     Pulses: Normal pulses.     Heart  sounds: Normal heart sounds.  Pulmonary:     Effort: Pulmonary effort is normal. No respiratory distress.     Breath sounds: Normal breath sounds. No wheezing, rhonchi or rales.  Chest:     Chest wall: Mass present.  Breasts:    Right: Mass present.       Comments: Area of mass to right breast, nontender, no erythema, no nipple change, no other mass appreciated Musculoskeletal:        General: Normal range of motion.     Cervical back: Normal range of motion.     Right lower leg: No edema.     Left lower leg: No edema.  Lymphadenopathy:     Cervical: No cervical adenopathy.  Neurological:     General: No focal deficit present.     Mental Status: She is alert and oriented to person, place, and time.  Psychiatric:        Mood and Affect: Mood normal.        Thought Content: Thought content normal.    No results found for any visits on 09/05/23.      Assessment & Plan:   Mass of right breast, unspecified  quadrant -     MM 3D DIAGNOSTIC MAMMOGRAM UNILATERAL RIGHT BREAST; Future -     US  LIMITED ULTRASOUND INCLUDING AXILLA RIGHT BREAST; Future  Unspecified lump in axillary tail of the right breast -     MM 3D DIAGNOSTIC MAMMOGRAM UNILATERAL RIGHT BREAST; Future  Acute bacterial conjunctivitis of left eye -     Neomycin -Polymyxin-Dexameth; Place 1 drop into the left eye every 6 (six) hours for 10 days.  Dispense: 2 mL; Refill: 0     Meds ordered this encounter  Medications   neomycin -polymyxin b-dexamethasone  (MAXITROL) 3.5-10000-0.1 SUSP    Sig: Place 1 drop into the left eye every 6 (six) hours for 10 days.    Dispense:  2 mL    Refill:  0    Return if symptoms worsen or fail to improve.  Wellington Half, FNP

## 2023-09-07 ENCOUNTER — Ambulatory Visit: Payer: BLUE CROSS/BLUE SHIELD | Admitting: Orthopaedic Surgery

## 2023-09-08 ENCOUNTER — Encounter: Payer: Self-pay | Admitting: Family Medicine

## 2023-09-08 DIAGNOSIS — N6310 Unspecified lump in the right breast, unspecified quadrant: Secondary | ICD-10-CM | POA: Insufficient documentation

## 2023-09-08 DIAGNOSIS — H1032 Unspecified acute conjunctivitis, left eye: Secondary | ICD-10-CM | POA: Insufficient documentation

## 2023-09-08 NOTE — Patient Instructions (Signed)
 I have ordered a diagnostic mammogram and ultrasound of the right breast.  Someone will be reaching out to get you scheduled for this.  Will be in contact with results once they are received.  I have also sent in a refill of your eyedrops.  Continue to use these as prescribed for the full 10 days.  Follow-up with me for new or worsening symptoms.

## 2023-09-11 ENCOUNTER — Ambulatory Visit: Payer: BLUE CROSS/BLUE SHIELD | Admitting: Physical Therapy

## 2023-09-12 ENCOUNTER — Ambulatory Visit
Admission: RE | Admit: 2023-09-12 | Discharge: 2023-09-12 | Disposition: A | Discharge status: Home / Self Care | Source: Ambulatory Visit | Attending: Family Medicine | Admitting: Family Medicine

## 2023-09-12 ENCOUNTER — Other Ambulatory Visit: Payer: Self-pay | Admitting: Family Medicine

## 2023-09-12 DIAGNOSIS — N6310 Unspecified lump in the right breast, unspecified quadrant: Secondary | ICD-10-CM

## 2023-09-12 DIAGNOSIS — C50411 Malignant neoplasm of upper-outer quadrant of right female breast: Secondary | ICD-10-CM | POA: Diagnosis not present

## 2023-09-12 DIAGNOSIS — Z171 Estrogen receptor negative status [ER-]: Secondary | ICD-10-CM | POA: Diagnosis not present

## 2023-09-12 DIAGNOSIS — N6331 Unspecified lump in axillary tail of the right breast: Secondary | ICD-10-CM

## 2023-09-12 DIAGNOSIS — N6311 Unspecified lump in the right breast, upper outer quadrant: Secondary | ICD-10-CM | POA: Diagnosis not present

## 2023-09-12 HISTORY — PX: BREAST BIOPSY: SHX20

## 2023-09-13 LAB — SURGICAL PATHOLOGY

## 2023-09-18 ENCOUNTER — Ambulatory Visit: Payer: BLUE CROSS/BLUE SHIELD | Attending: Internal Medicine | Admitting: Physical Therapy

## 2023-09-18 DIAGNOSIS — R293 Abnormal posture: Secondary | ICD-10-CM | POA: Diagnosis not present

## 2023-09-18 DIAGNOSIS — M6281 Muscle weakness (generalized): Secondary | ICD-10-CM | POA: Diagnosis not present

## 2023-09-18 NOTE — Therapy (Signed)
 OUTPATIENT PHYSICAL THERAPY FEMALE PELVIC TREATMENT   Patient Name: Alyssa Robinson MRN: [id number] DOB:[birth year], 73 y.o., female Today's Date: 09/18/2023    END OF SESSION:  PT End of Session - 09/18/23 0935     Visit Number 9    Date for PT Re-Evaluation 09/22/23    Authorization Type MCR    Progress Note Due on Visit 18    PT Start Time 0932    PT Stop Time 1012    PT Time Calculation (min) 40 min    Activity Tolerance Patient tolerated treatment well    Behavior During Therapy WFL for tasks assessed/performed                Past Medical History:  Diagnosis Date   Allergy 20-25 years ago   Anemia    hx   Arthritis 8-10 years ago   Blood transfusion without reported diagnosis 20 years ago   Bronchitis    Bronchitis    hx   Closed patellar sleeve fracture of left knee    Diverticulosis    GERD (gastroesophageal reflux disease)    occ   Headache(784.0)    migraines occ   Hypertension    no meds in over 43yrs   Pneumonia    Seasonal allergies    Past Surgical History:  Procedure Laterality Date   ABDOMINAL HYSTERECTOMY     73 y/o   APPENDECTOMY  03/17/2013   Procedure: INCIDENTAL APPENDECTOMY;  Surgeon: Diantha Fossa, MD;  Location: [facility] OR;  Service: General;;   BREAST BIOPSY Right 09/12/2023   US  RT BREAST BX W LOC DEV 1ST LESION IMG BX SPEC US  GUIDE 09/12/2023 [facility]MO[facility]AP[facility]   capsule endoscopy     COLON SURGERY     COLONOSCOPY     in 2012 -    COLOSTOMY CLOSURE  03/17/2013   COLOSTOMY REVERSAL  03/17/2013   Procedure: COLOSTOMY REVERSAL;  Surgeon: Diantha Fossa, MD;  Location: [facility] OR;  Service: General;;   EUS N/A 04/30/2015   Procedure: UPPER ENDOSCOPIC ULTRASOUND (EUS) LINEAR;  Surgeon: Alvis Jourdain, MD;  Location: [facility];  Service: Endoscopy;  Laterality: N/A;   HERNIA REPAIR     X2 2-4 y/o inguinal   JOINT REPLACEMENT  ORID Knee/right hip   2021/2022   LAPAROTOMY N/A 10/17/2012   Procedure: EXPLORATORY LAPAROTOMY,  COLON RESECTION  AND COLOSTOMY;  Surgeon: Diantha Fossa, MD;  Location: [facility] OR;  Service: General;  Laterality: N/A;   LIPOMA EXCISION Left    left arm   LYSIS OF ADHESION  03/17/2013   Procedure: LYSIS OF ADHESION;  Surgeon: Diantha Fossa, MD;  Location: [facility] OR;  Service: General;;   ORIF PATELLA Left 02/20/2020   Procedure: OPEN REDUCTION INTERNAL (ORIF) FIXATION LEFT PATELLA;  Surgeon: Wes Hamman, MD;  Location: [facility];  Service: Orthopedics;  Laterality: Left;   SMALL INTESTINE SURGERY  Perforated colon   2014   TOTAL HIP ARTHROPLASTY Right 09/12/2021   Procedure: RIGHT TOTAL HIP ARTHROPLASTY ANTERIOR APPROACH;  Surgeon: Wes Hamman, MD;  Location: [facility] OR;  Service: Orthopedics;  Laterality: Right;  3-C   TOTAL HIP ARTHROPLASTY Left 02/23/2023   Procedure: LEFT TOTAL HIP ARTHROPLASTY ANTERIOR APPROACH;  Surgeon: Wes Hamman, MD;  Location: [facility] OR;  Service: Orthopedics;  Laterality: Left;  3-C   UPPER GASTROINTESTINAL ENDOSCOPY     2013 -   Patient Active Problem List   Diagnosis Date Noted   Mass of right  breast 09/08/2023   Acute bacterial conjunctivitis of left eye 09/08/2023   Mild protein-calorie malnutrition (HCC) 06/05/2023   Anogenital candidiasis in female 06/04/2023   SBO (small bowel obstruction) (HCC) 05/08/2023   Status post total replacement of left hip 02/23/2023   History of colonic polyps 02/03/2022   Family history of early CAD 07/02/2020   Lipoma of abdominal wall 08/04/2019   Osteopenia 01/28/2019   Thrombocytopenia (HCC) 10/17/2017   Vaginal atrophy 06/01/2015   Cyst of pancreas 03/06/2015   Glaucoma suspect of both eyes 03/11/2014   Nuclear sclerosis of both eyes 03/11/2014   Routine general medical examination at a health care facility 12/11/2012   Hyperglycemia 12/11/2012   Essential hypertension, benign 12/11/2012    PCP: Adelia Homestead, MD   REFERRING PROVIDER: Adelia Homestead, MD   REFERRING DIAG: [phone] (ICD-10-CM) - SBO  (small bowel obstruction) (HCC)  THERAPY DIAG:  Muscle weakness (generalized)  Abnormal posture  Rationale for Evaluation and Treatment: Rehabilitation  ONSET DATE: chronic  SUBJECTIVE:                                                                                                                                                                                           SUBJECTIVE STATEMENT: Has been adding in more foods to her diet, not seeing bowel changes with this. Has been doing bowel massage 2x daily and exercising regularly and continues to have bowel movements daily, feels like full evacuation, no straining. Using 2 tbs daily of miralax  now.   Pt reports she has been diagnosed with stage 1 breast cancer and has been following up with care for this.    Fluid intake: 12oz water in am x2, boost, orange juice, 2 more glasses of water in afternoon, and one at dinner - is eating a lot of broths as well  PAIN:  Are you having pain? No   PRECAUTIONS: None  RED FLAGS: None   WEIGHT BEARING RESTRICTIONS: No  FALLS:  Has patient fallen in last 6 months? 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  ACTIVITY LEVEL : walking every day 5000-8000 steps daily; and active with friends   PLOF: Independent  PATIENT GOALS: to have more regular bowels  PERTINENT HISTORY:  HYSTERECTOMY, COLOSTOMY REVERSAL, HERNIA REPAIR x2 at 73 yo, Rt hip replacement Sexual abuse: No  BOWEL MOVEMENT: Pain with bowel movement: Yes sometimes Type of bowel movement:Type (Bristol Stool Scale) 1, Frequency usually once in the morning, and Strain yes  Fully empty rectum: No Leakage: No Pads: No Fiber supplement/laxative colace (2x daily)  URINATION: Pain with urination: Yes  not often but sometimes pressure/tightness at abdomen Fully empty bladder: No Stream: Strong and Weak Urgency: No Frequency: no Leakage: Laughing Pads: No  INTERCOURSE:  Ability to have  vaginal penetration Yes  Pain with intercourse: Initial Penetration DrynessYes  Climax: hard to achieve  Marinoff Scale: 0/3  PREGNANCY: Vaginal deliveries 0 Tearing No Episiotomy No C-section deliveries 0 Currently pregnant No  PROLAPSE: None   OBJECTIVE:  Note: Objective measures were completed at Evaluation unless otherwise noted.  DIAGNOSTIC FINDINGS:    COGNITION: Overall cognitive status: Within functional limits for tasks assessed     SENSATION: Light touch: Appears intact  GAIT: WFL  POSTURE: rounded shoulders and posterior pelvic tilt   LUMBARAROM/PROM:  A/PROM A/PROM  eval  Flexion WFL  Extension WFL  Right lateral flexion Limited by 25%  Left lateral flexion Limited by 25%  Right rotation Limited by 25%  Left rotation Limited by 25%   (Blank rows = not tested)  LOWER EXTREMITY ROM:  WFL   LOWER EXTREMITY MMT:  WFL  PALPATION:   General: tightness in bil piriformis and lumbar paraspinals   Pelvic Alignment: WFL  Abdominal: several scars throughout abdominal quadrants with restrictions in all directions but no pain reports feeling tight                External Perineal Exam: no TTP                              Internal Pelvic Floor: no TTP, tightness throughout  Patient confirms identification and approves PT to assess internal pelvic floor and treatment Yes No emotional/communication barriers or cognitive limitation. Patient is motivated to learn. Patient understands and agrees with treatment goals and plan. PT explains patient will be examined in standing, sitting, and lying down to see how their muscles and joints work. When they are ready, they will be asked to remove their underwear so PT can examine their perineum. The patient is also given the option of providing their own chaperone as one is not provided in our facility. The patient also has the right and is explained the right to defer or refuse any part of the evaluation or  treatment including the internal exam. With the patient's consent, PT will use one gloved finger to gently assess the muscles of the pelvic floor, seeing how well it contracts and relaxes and if there is muscle symmetry. After, the patient will get dressed and PT and patient will discuss exam findings and plan of care. PT and patient discuss plan of care, schedule, attendance policy and HEP activities.   PELVIC MMT:   MMT eval 08/14/23  Vaginal    Internal Anal Sphincter 3/5 4/5  External Anal Sphincter 4/5, 3s (3/5 additional 20s), 7 reps 4/5, 45s, 8 reps  Puborectalis    Diastasis Recti    (Blank rows = not tested)        TONE: Slightly decreased   PROLAPSE: Not seen in rectal assessment   TODAY'S TREATMENT:  DATE:   09/18/23: Reviewed goals, progress, HEP - pt denied questions or concerns Manual abdominal massage - greatly improved tissue mobility throughout, does still have tension at stoma site but improved Pt educated to continue voiding mechanics, abdominal massage, and MD recommendations for improved bowel health and habits. Pt agreed.   PATIENT EDUCATION:  Education details: abdominal massage, voiding mechanics, [id number] Person educated: Patient Education method: Explanation, Demonstration, Tactile cues, Verbal cues, and Handouts Education comprehension: verbalized understanding, returned demonstration, verbal cues required, tactile cues required, and needs further education  HOME EXERCISE PROGRAM: [id number]  ASSESSMENT:  CLINICAL IMPRESSION: Patient is a 73 y.o. female  who was seen today for physical therapy treatment for decreased bowel regularity and reports she has maintained improvement well. Denied concerns or questions and pleased with all progress. Pt states she has been having bowel movement regularly and fully evacuating without straining.   Pt agreeable to discharge today, understands she will need new PT referral for future PT needs.  All goals met.   OBJECTIVE IMPAIRMENTS: decreased activity tolerance, decreased coordination, decreased endurance, decreased mobility, decreased strength, increased fascial restrictions, increased muscle spasms, impaired flexibility, postural dysfunction, and pain.   ACTIVITY LIMITATIONS: continence  PARTICIPATION LIMITATIONS: interpersonal relationship and community activity  PERSONAL FACTORS: Time since onset of injury/illness/exacerbation and 1 comorbidity: medical history are also affecting patient's functional outcome.   REHAB POTENTIAL: Good  CLINICAL DECISION MAKING: Stable/uncomplicated  EVALUATION COMPLEXITY: Low   GOALS: Goals reviewed with patient? Yes  SHORT TERM GOALS: Target date: 07/19/23 *all STG met 07/19/23  Pt to be I with HEP.  Baseline: Goal status: MET  2.  Pt to be I with abdominal massage for improved peristalsis for  bowel regularity.  Baseline:  Goal status: MET  3.  Pt to be I with voiding mechanics for improved bowel emptying without straining to decreased stress at pelvic floor. Baseline:  Goal status: MET   LONG TERM GOALS: Target date: 08/19/23  Pt to be I with advanced HEP.  Baseline:  Goal status: MET  2.  Pt to demonstrate minimal to no restrictions in abdominal quadrants due to improved mobility and decreased tension at bowels.  Baseline:  Goal status: MET  3.  Pt to demonstrate full range of mobility at trunk in all directions for decreased strain at pelvic floor.  Baseline:  Goal status: MET  4.  Pt will report her BMs are complete due to improved bowel habits and evacuation techniques at least 75% of the time.  Baseline:  Goal status: MET  5.  Pt to demonstrate at least 4/5 pelvic floor strength and isometric for at least 45s rectally for improved pelvic stability and decreased strain at pelvic floor.  Baseline:  Goal status:  MET  6. Pt to report at least 6 weeks of no constipation or straining with bowel movement to improve bowel regularity and decrease risk of stool burden for improved bowel health and improved confidence with community outings.  Baseline:  Goal status: MET   PLAN:  PT FREQUENCY: 1-2x/week  PT DURATION: 10 sessions  PLANNED INTERVENTIONS: 97110-Therapeutic exercises, 97530- Therapeutic activity, [id number]- Neuromuscular re-education, 97535- Self Care, and [id number]- Manual therapy  PLAN FOR NEXT SESSION:    PHYSICAL THERAPY DISCHARGE SUMMARY  Visits from Start of Care: 9  Current functional level related to goals / functional outcomes: All goals met   Remaining deficits: None per pt   Education / Equipment: HEP, voiding mechanics    Patient agrees to discharge. Patient goals were  met. Patient is being discharged due to meeting the stated rehab goals.   Avie Lemme, PT, DPT 09/17/2509:47 AM

## 2023-09-21 ENCOUNTER — Other Ambulatory Visit: Payer: Self-pay | Admitting: General Surgery

## 2023-09-21 ENCOUNTER — Encounter: Payer: Self-pay | Admitting: *Deleted

## 2023-09-21 DIAGNOSIS — Z170 Estrogen receptor positive status [ER+]: Secondary | ICD-10-CM

## 2023-09-21 DIAGNOSIS — Z171 Estrogen receptor negative status [ER-]: Secondary | ICD-10-CM | POA: Diagnosis not present

## 2023-09-21 DIAGNOSIS — Z800 Family history of malignant neoplasm of digestive organs: Secondary | ICD-10-CM | POA: Diagnosis not present

## 2023-09-21 DIAGNOSIS — Z803 Family history of malignant neoplasm of breast: Secondary | ICD-10-CM | POA: Diagnosis not present

## 2023-09-21 DIAGNOSIS — C50411 Malignant neoplasm of upper-outer quadrant of right female breast: Secondary | ICD-10-CM | POA: Insufficient documentation

## 2023-09-21 DIAGNOSIS — C50011 Malignant neoplasm of nipple and areola, right female breast: Secondary | ICD-10-CM | POA: Diagnosis not present

## 2023-09-21 DIAGNOSIS — Z853 Personal history of malignant neoplasm of breast: Secondary | ICD-10-CM | POA: Insufficient documentation

## 2023-09-24 ENCOUNTER — Inpatient Hospital Stay: Attending: Hematology and Oncology | Admitting: Hematology and Oncology

## 2023-09-24 ENCOUNTER — Encounter: Admitting: Internal Medicine

## 2023-09-24 ENCOUNTER — Other Ambulatory Visit: Payer: Self-pay

## 2023-09-24 ENCOUNTER — Ambulatory Visit
Admission: RE | Admit: 2023-09-24 | Discharge: 2023-09-24 | Disposition: A | Discharge status: Home / Self Care | Source: Ambulatory Visit | Attending: General Surgery | Admitting: General Surgery

## 2023-09-24 VITALS — BP 142/79 | HR 61 | Temp 98.7°F | Resp 18 | Ht 62.0 in | Wt 106.3 lb

## 2023-09-24 DIAGNOSIS — C50411 Malignant neoplasm of upper-outer quadrant of right female breast: Secondary | ICD-10-CM

## 2023-09-24 DIAGNOSIS — Z1722 Progesterone receptor negative status: Secondary | ICD-10-CM | POA: Insufficient documentation

## 2023-09-24 DIAGNOSIS — N6311 Unspecified lump in the right breast, upper outer quadrant: Secondary | ICD-10-CM | POA: Diagnosis not present

## 2023-09-24 DIAGNOSIS — Z171 Estrogen receptor negative status [ER-]: Secondary | ICD-10-CM | POA: Diagnosis not present

## 2023-09-24 DIAGNOSIS — Z17421 Hormone receptor negative with human epidermal growth factor receptor 2 negative status: Secondary | ICD-10-CM | POA: Insufficient documentation

## 2023-09-24 DIAGNOSIS — Z170 Estrogen receptor positive status [ER+]: Secondary | ICD-10-CM

## 2023-09-24 DIAGNOSIS — B379 Candidiasis, unspecified: Secondary | ICD-10-CM | POA: Diagnosis not present

## 2023-09-24 MED ORDER — GADOPICLENOL 0.5 MMOL/ML IV SOLN
5.0000 mL | Freq: Once | INTRAVENOUS | Status: AC | PRN
  Administered 2023-09-24: 5 mL via INTRAVENOUS

## 2023-09-24 NOTE — Progress Notes (Signed)
 [facility] CONSULT NOTE  Patient Care Team: Adelia Homestead, MD as PCP - General (Internal Medicine) Auther Bo, RN as Oncology Nurse Navigator Alane Hsu, RN as Oncology Nurse Navigator Cameron Cea, MD as Consulting Physician (Hematology and Oncology)  CHIEF COMPLAINTS/PURPOSE OF CONSULTATION:  Newly diagnosed breast cancer  HISTORY OF PRESENTING ILLNESS:   History of Present Illness Alyssa Robinson is a 73 year old female with invasive lobular cancer who presents for a consultation regarding her treatment plan. She is accompanied by her husband, Ron.  Approximately one month ago, she discovered a lump in her left breast with skin dimpling. Despite dense breasts and a normal mammogram on June 19, 2023, she and her husband were able to consistently detect the lump. A diagnostic workup, including a mammogram, ultrasound, and biopsy, confirmed invasive lobular cancer. The tumor measures 1.1 centimeters with no lymph node enlargement on ultrasound. It is triple negative, lacking estrogen, progesterone, and HER2 receptors, and is classified as grade 2 with a KI-67 proliferation index of 10%.  Her medical history includes two small bowel obstructions, influencing her to follow a soft, low-fiber diet. She maintains an active lifestyle, engaging in walking, using light weights, and participating in chair yoga and line dancing.  There is no significant family history of breast cancer, although her sister had breast cancer and underwent chemotherapy. Genetic testing has been initiated to assess for potential hereditary cancer syndromes.  She experiences no nausea, significant fatigue, neuropathy, current anemia, or significant changes in taste.   I reviewed her records extensively and collaborated the history with the patient.     MEDICAL HISTORY:  Past Medical History:  Diagnosis Date   Allergy 20-25 years ago   Anemia    hx   Arthritis 8-10 years ago    Blood transfusion without reported diagnosis 20 years ago   Bronchitis    Bronchitis    hx   Closed patellar sleeve fracture of left knee    Diverticulosis    GERD (gastroesophageal reflux disease)    occ   Headache(784.0)    migraines occ   Hypertension    no meds in over 26yrs   Pneumonia    Seasonal allergies     SURGICAL HISTORY: Past Surgical History:  Procedure Laterality Date   ABDOMINAL HYSTERECTOMY     73 y/o   APPENDECTOMY  03/17/2013   Procedure: INCIDENTAL APPENDECTOMY;  Surgeon: Diantha Fossa, MD;  Location[facility] OR;  Service: General;;   BREAST BIOPSY Right 09/12/2023   US  RT BREAST BX W LOC DEV 1ST LESION IMG BX SPEC US  GUIDE 09/12/2023 [facility]AMMOGRAP[facility]   capsule endoscopy     COLON SURGERY     COLONOSCOPY     in 2012 -    COLOSTOMY CLOSURE  03/17/2013   COLOSTOMY REVERSAL  03/17/2013   Procedure: COLOSTOMY REVERSAL;  Surgeon: Diantha Fossa, MD;  Location: [facility] OR;  Service: General;;   EUS N/A 04/30/2015   Procedure: UPPER ENDOSCOPIC ULTRASOUND (EUS) LINEAR;  Surgeon: Alvis Jourdain, MD;  Location: [facility];  Service: Endoscopy;  Laterality: N/A;   HERNIA REPAIR     X2 2-4 y/o inguinal   JOINT REPLACEMENT  ORID Knee/right hip   2021/2022   LAPAROTOMY N/A 10/17/2012   Procedure: EXPLORATORY LAPAROTOMY,  COLON RESECTION AND COLOSTOMY;  Surgeon: Diantha Fossa, MD;  Location: [facility] OR;  Service: General;  Laterality: N/A;   LIPOMA EXCISION Left    left arm  LYSIS OF ADHESION  03/17/2013   Procedure: LYSIS OF ADHESION;  Surgeon: Diantha Fossa, MD;  Location: [facility] OR;  Service: General;;   ORIF PATELLA Left 02/20/2020   Procedure: OPEN REDUCTION INTERNAL (ORIF) FIXATION LEFT PATELLA;  Surgeon: Wes Hamman, MD;  Location: [facility];  Service: Orthopedics;  Laterality: Left;   SMALL INTESTINE SURGERY  Perforated colon   2014   TOTAL HIP ARTHROPLASTY Right 09/12/2021   Procedure: RIGHT TOTAL HIP ARTHROPLASTY ANTERIOR APPROACH;  Surgeon: Wes Hamman, MD;  Location: [facility];  Service: Orthopedics;  Laterality: Right;  3-C   TOTAL HIP ARTHROPLASTY Left 02/23/2023   Procedure: LEFT TOTAL HIP ARTHROPLASTY ANTERIOR APPROACH;  Surgeon: Wes Hamman, MD;  Location: [facility];  Service: Orthopedics;  Laterality: Left;  3-C   UPPER GASTROINTESTINAL ENDOSCOPY     2013 -    SOCIAL HISTORY: Social History   Socioeconomic History   Marital status: Married    Spouse name: Not on file   Number of children: Not on file   Years of education: Not on file   Highest education level: Bachelor's degree (e.g., BA, AB, BS)  Occupational History   Not on file  Tobacco Use   Smoking status: Never    Passive exposure: Never   Smokeless tobacco: Never   Tobacco comments:    Second hand smoke with mother/father smoking  Vaping Use   Vaping status: Never Used  Substance and Sexual Activity   Alcohol use: Not Currently    Alcohol/week: 2.0 standard drinks of alcohol   Drug use: No   Sexual activity: Yes    Birth control/protection: Post-menopausal  Other Topics Concern   Not on file  Social History Narrative   Not on file   Social Drivers of Health   Financial Resource Strain: Low Risk  (07/10/2023)   Overall Financial Resource Strain (CARDIA)    Difficulty of Paying Living Expenses: Not hard at all  Food Insecurity: No Food Insecurity (09/24/2023)   Hunger Vital Sign    Worried About Running Out of Food in the Last Year: Never true    Ran Out of Food in the Last Year: Never true  Transportation Needs: No Transportation Needs (09/24/2023)   PRAPARE - Administrator, Civil Service (Medical): No    Lack of Transportation (Non-Medical): No  Physical Activity: Sufficiently Active (07/10/2023)   Exercise Vital Sign    Days of Exercise per Week: 5 days    Minutes of Exercise per Session: 60 min  Stress: No Stress Concern Present (07/10/2023)   [employer] of Occupational Health - Occupational Stress Questionnaire    Feeling of  Stress : Not at all  Social Connections: Socially Integrated (07/10/2023)   Social Connection and Isolation Panel [NHANES]    Frequency of Communication with Friends and Family: More than three times a week    Frequency of Social Gatherings with Friends and Family: More than three times a week    Attends Religious Services: More than 4 times per year    Active Member of [employer] or Organizations: Yes    Attends Banker Meetings: 1 to 4 times per year    Marital Status: Married  Catering manager Violence: Not At Risk (09/24/2023)   Humiliation, Afraid, Rape, and Kick questionnaire    Fear of Current or Ex-Partner: No    Emotionally Abused: No    Physically Abused: No    Sexually Abused: No  FAMILY HISTORY: Family History  Problem Relation Age of Onset   Esophageal cancer Mother        and ovarian cancer   Alcohol abuse Mother    Arthritis Mother    Cancer Mother    Depression Mother    Heart disease Father    Alcohol abuse Father    Hypertension Father    Diabetes Father    Early death Father    Breast cancer Sister    Cancer Maternal Aunt    Cancer Maternal Aunt    Breast cancer Maternal Grandfather    Colon cancer Other    Pancreatic cancer Other    Stroke Neg Hx    Hyperlipidemia Neg Hx    Kidney disease Neg Hx    COPD Neg Hx     ALLERGIES:  is allergic to caffeine, fluad quadrivalent [influenza vac a&b sa adj quad], and sodium lauryl sulfate.  MEDICATIONS:  Current Outpatient Medications  Medication Sig Dispense Refill   fluconazole (DIFLUCAN) 150 MG tablet Take 150 mg by mouth daily. Take one tab now persist may take second tab in 3 days.     loratadine (CLARITIN) 10 MG tablet Take 10 mg by mouth daily.     miconazole  (MICOTIN) 2 % cream Apply 1 Application topically 2 (two) times daily.     Nutritional Supplements (BOOST HIGH PROTEIN PO) Take 1 Container by mouth 3 (three) times daily with meals. Regular original boost 2 x daily.     polyethylene  glycol powder (GLYCOLAX /MIRALAX ) 17 GM/SCOOP powder Take by mouth once. Daily (Patient taking differently: Take by mouth once. 2 teaspoons with orange juice every morning.)     Probiotic Product (ULTRAFLORA IMMUNE HEALTH PO) Take 1 capsule by mouth daily.     sodium chloride  (OCEAN) 0.65 % SOLN nasal spray Place 2-3 sprays into both nostrils 2 (two) times daily.     tobramycin (TOBREX) 0.3 % ophthalmic solution Place 1 drop into both eyes every 4 (four) hours.     No current facility-administered medications for this visit.    REVIEW OF SYSTEMS:   Constitutional: Denies fevers, chills or abnormal night sweats Breast: As of a palpable right breast lump All other systems were reviewed with the patient and are negative.  PHYSICAL EXAMINATION: ECOG PERFORMANCE STATUS: 1 - Symptomatic but completely ambulatory  Vitals:   09/24/23 1143  BP: (!) 142/79  Pulse: 61  Resp: 18  Temp: 98.7 F (37.1 C)  SpO2: 100%   Filed Weights   09/24/23 1143  Weight: 106 lb 4.8 oz (48.2 kg)    GENERAL:alert, no distress and comfortable    LABORATORY DATA:  I have reviewed the data as listed Lab Results  Component Value Date   WBC 5.1 06/06/2023   HGB 12.6 06/06/2023   HCT 39.6 06/06/2023   MCV 99.7 06/06/2023   PLT 96 (L) 06/06/2023   Lab Results  Component Value Date   NA 134 (L) 06/06/2023   K 3.7 06/06/2023   CL 104 06/06/2023   CO2 22 06/06/2023    RADIOGRAPHIC STUDIES: I have personally reviewed the radiological reports and agreed with the findings in the report.  ASSESSMENT AND PLAN:  Malignant neoplasm of upper-outer quadrant of right breast in female, estrogen receptor negative (HCC) 09/12/2023: Palpable lump right breast with dimpling of the overlying skin for 2 months, mammogram and ultrasound: 1.1 cm asymmetry/mass UOQ right breast, axilla negative: Biopsy: Grade 2 invasive lobular cancer ER 0%, PR 0%, HER2 2+ by IHC,  FISH negative ratio 1  Pathology and radiology  counseling: Discussed with the patient, the details of pathology including the type of breast cancer,the clinical staging, the significance of ER, PR and HER-2/neu receptors and the implications for treatment. After reviewing the pathology in detail, we proceeded to discuss the different treatment options between surgery, radiation, and chemotherapy.  Recommendation: Breast conserving surgery with sentinel lymph node biopsy Adjuvant chemotherapy with Taxotere and Cytoxan every 3 weeks x 4 Adjuvant radiation therapy Breast MRI being ordered: If the breast MRI shows substantially worse disease then we will discuss if neoadjuvant chemotherapy would be of benefit. Genetic testing has been performed  Return to clinic after surgery to discuss final pathology report She is an avid golfer and volunteers of the hospice care.  Her husband is a Lawyer and [employer].  Assessment and Plan Assessment & Plan Invasive lobular carcinoma of breast, stage 1B Invasive lobular carcinoma, stage 1B, 1.1 cm, triple negative, grade 2, low KI-67 (10%). Treatment limited to chemotherapy and surgery. Adjusted chemotherapy regimen for age and quality of life. Informed of chemotherapy risks. - Proceed with lumpectomy and sentinel lymph node biopsy. - Administer TC (Taxotere and Cytoxan) every three weeks for four cycles, approximately nine weeks. - Provide pre-treatment anti-nausea medications. - Monitor blood counts, administer white blood cell booster on day three post-chemotherapy. - Advise wearing compression socks and gloves during chemotherapy. - Schedule radiation therapy post-chemotherapy. - Arrange genetic testing results from [employer] - Discuss research study participation for early breast cancer detection.  Goals of Care She prioritizes quality of life, minimal aggressive treatments, influenced by hospice volunteering experience. - Respect her wishes for minimal intervention  while ensuring effective treatment. - Engage in shared decision-making to align treatment with her values and preferences.     All questions were answered. The patient knows to call the clinic with any problems, questions or concerns.    Viinay K Minh Jasper, MD 09/24/23

## 2023-09-24 NOTE — Therapy (Signed)
 OUTPATIENT PHYSICAL THERAPY BREAST CANCER BASELINE EVALUATION   Patient Name: Alyssa Robinson MRN: [id number] DOB:1950-07-27, 73 y.o., female Today's Date: 09/25/2023  END OF SESSION:  PT End of Session - 09/25/23 1559     Visit Number 1    Number of Visits 2    Date for PT Re-Evaluation 11/06/23    PT Start Time 1501    PT Stop Time 1600    PT Time Calculation (min) 59 min    Activity Tolerance Patient tolerated treatment well    Behavior During Therapy WFL for tasks assessed/performed             Past Medical History:  Diagnosis Date   Allergy 20-25 years ago   Anemia    hx   Arthritis 8-10 years ago   Blood transfusion without reported diagnosis 20 years ago   Bronchitis    Bronchitis    hx   Closed patellar sleeve fracture of left knee    Diverticulosis    GERD (gastroesophageal reflux disease)    occ   Headache(784.0)    migraines occ   Hypertension    no meds in over 57yrs   Pneumonia    Seasonal allergies    Past Surgical History:  Procedure Laterality Date   ABDOMINAL HYSTERECTOMY     73 y/o   APPENDECTOMY  03/17/2013   Procedure: INCIDENTAL APPENDECTOMY;  Surgeon: Diantha Fossa, MD;  Location: [facility] OR;  Service: General;;   BREAST BIOPSY Right 09/12/2023   US  RT BREAST BX W LOC DEV 1ST LESION IMG BX SPEC US  GUIDE 09/12/2023 [facility]   capsule endoscopy     COLON SURGERY     COLONOSCOPY     in 2012 -    COLOSTOMY CLOSURE  03/17/2013   COLOSTOMY REVERSAL  03/17/2013   Procedure: COLOSTOMY REVERSAL;  Surgeon: Diantha Fossa, MD;  Location: [facility] OR;  Service: General;;   EUS N/A 04/30/2015   Procedure: UPPER ENDOSCOPIC ULTRASOUND (EUS) LINEAR;  Surgeon: Alvis Jourdain, MD;  Location: [facility];  Service: Endoscopy;  Laterality: N/A;   HERNIA REPAIR     X2 2-4 y/o inguinal   JOINT REPLACEMENT  ORID Knee/right hip   2021/2022   LAPAROTOMY N/A 10/17/2012   Procedure: EXPLORATORY LAPAROTOMY,  COLON RESECTION AND COLOSTOMY;  Surgeon: Diantha Fossa,  MD;  Location: [facility] OR;  Service: General;  Laterality: N/A;   LIPOMA EXCISION Left    left arm   LYSIS OF ADHESION  03/17/2013   Procedure: LYSIS OF ADHESION;  Surgeon: Diantha Fossa, MD;  Location: [facility] OR;  Service: General;;   ORIF PATELLA Left 02/20/2020   Procedure: OPEN REDUCTION INTERNAL (ORIF) FIXATION LEFT PATELLA;  Surgeon: Wes Hamman, MD;  Location: [facility];  Service: Orthopedics;  Laterality: Left;   SMALL INTESTINE SURGERY  Perforated colon   2014   TOTAL HIP ARTHROPLASTY Right 09/12/2021   Procedure: RIGHT TOTAL HIP ARTHROPLASTY ANTERIOR APPROACH;  Surgeon: Wes Hamman, MD;  Location: [facility] OR;  Service: Orthopedics;  Laterality: Right;  3-C   TOTAL HIP ARTHROPLASTY Left 02/23/2023   Procedure: LEFT TOTAL HIP ARTHROPLASTY ANTERIOR APPROACH;  Surgeon: Wes Hamman, MD;  Location: [facility] OR;  Service: Orthopedics;  Laterality: Left;  3-C   UPPER GASTROINTESTINAL ENDOSCOPY     2013 -   Patient Active Problem List   Diagnosis Date Noted   Malignant neoplasm of upper-outer quadrant of right breast in female, estrogen receptor negative (HCC) 09/21/2023  Mass of right breast 09/08/2023   Acute bacterial conjunctivitis of left eye 09/08/2023   Mild protein-calorie malnutrition (HCC) 06/05/2023   Anogenital candidiasis in female 06/04/2023   SBO (small bowel obstruction) (HCC) 05/08/2023   Status post total replacement of left hip 02/23/2023   History of colonic polyps 02/03/2022   Family history of early CAD 07/02/2020   Lipoma of abdominal wall 08/04/2019   Osteopenia 01/28/2019   Thrombocytopenia (HCC) 10/17/2017   Vaginal atrophy 06/01/2015   Cyst of pancreas 03/06/2015   Glaucoma suspect of both eyes 03/11/2014   Nuclear sclerosis of both eyes 03/11/2014   Routine general medical examination at a health care facility 12/11/2012   Hyperglycemia 12/11/2012   Essential hypertension, benign 12/11/2012    PCP:   REFERRING PROVIDER: Enid Harry,  MD  REFERRING DIAG: Right Breast Cancer  THERAPY DIAG:  Malignant neoplasm of upper-outer quadrant of right breast in female, estrogen receptor negative (HCC)  Abnormal posture  Rationale for Evaluation and Treatment: Rehabilitation  ONSET DATE: 09/12/2023  SUBJECTIVE:                                                                                                                                                                                           SUBJECTIVE STATEMENT: Patient reports she is here today to be seen by her medical team for her newly diagnosed right breast cancer.   PERTINENT HISTORY:  Patient was diagnosed on 09/12/2023 with right grade 2 Invasive Lobular Carcinoma. It measures 1.1 cm and is located in the Upper-Outer quadrant. It is Triple Negative with a Ki67 of 10%. Pt had a recent breast MRI. Scheduled presently for Right lumpectomy with SLNB on 10/22/23, but MRI results unknown at this time. Pt is to talk with MD today or tomorrow and will decide her POC;lumpectomy, vs Mastectomy, vs chemo  PATIENT GOALS:   reduce lymphedema risk and learn post op HEP.   PAIN:  Are you having pain? No  PRECAUTIONS: Active CA , Bilateral THA, Left patellar ORIF  RED FLAGS: None   HAND DOMINANCE: right  WEIGHT BEARING RESTRICTIONS: No  FALLS:  Has patient fallen in last 6 months? No  LIVING ENVIRONMENT: Patient lives with: husband Lives in: House/apartment Has following equipment at home:   OCCUPATION:   LEISURE: Teacher, English as a foreign language, volunteers at  [employer], [employer]  PRIOR LEVEL OF FUNCTION: Independent   OBJECTIVE: Note: Objective measures were completed at Evaluation unless otherwise noted.  COGNITION: Overall cognitive status: Within functional limits for tasks assessed    POSTURE:  Forward head and rounded shoulders posture  UPPER EXTREMITY AROM/PROM:  A/PROM RIGHT  eval   Shoulder extension 64  Shoulder flexion 155  Shoulder abduction 178   Shoulder internal rotation 64  Shoulder external rotation 114    (Blank rows = not tested)  A/PROM LEFT   eval  Shoulder extension 64  Shoulder flexion 160  Shoulder abduction 180  Shoulder internal rotation 72  Shoulder external rotation 106    (Blank rows = not tested)  CERVICAL AROM: All within functional limits:      UPPER EXTREMITY STRENGTH: WNL  LYMPHEDEMA ASSESSMENTS (in cm):   LANDMARK RIGHT   eval  10 cm proximal to olecranon process 22.4  Olecranon process 20.2  10 cm proximal to ulnar styloid process 18.6  Just proximal to ulnar styloid process 13.9  Across hand at thumb web space 18.9  At base of 2nd digit 5.6  (Blank rows = not tested)  LANDMARK LEFT   eval  10 cm proximal to olecranon process 22.4  Olecranon process 21.2  10 cm proximal to ulnar styloid process 18.5  Just proximal to ulnar styloid process 14.1  Across hand at thumb web space 18.5  At base of 2nd digit 5.7  (Blank rows = not tested)  L-DEX LYMPHEDEMA SCREENING:  The patient was assessed using the L-Dex machine today to produce a lymphedema index baseline score. The patient will be reassessed on a regular basis (typically every 3 months) to obtain new L-Dex scores. If the score is > 6.5 points away from his/her baseline score indicating onset of subclinical lymphedema, it will be recommended to wear a compression garment for 4 weeks, 12 hours per day and then be reassessed. If the score continues to be > 6.5 points from baseline at reassessment, we will initiate lymphedema treatment. Assessing in this manner has a 95% rate of preventing clinically significant lymphedema.   L-DEX FLOWSHEETS - 09/25/23 1500       L-DEX LYMPHEDEMA SCREENING   Measurement Type Unilateral    L-DEX MEASUREMENT EXTREMITY Upper Extremity    POSITION  Standing    DOMINANT SIDE Right    At Risk Side Right    BASELINE SCORE (UNILATERAL) -2.8             QUICK DASH SURVEY: 2.27  PATIENT EDUCATION:   Education details: Time spent educating patient on aspects of self-care to maximize post op recovery. Patient was educated on where and how to get a post op compression bra to use to reduce post op edema. Patient was also educated on the use of SOZO screenings and surveillance principles for early identification of lymphedema onset. She was instructed to use the post op pillow in the axilla for pressure and pain relief. Patient educated on lymphedema risk reduction and post op shoulder/posture HEP. Person educated: Patient Education method: Explanation, Demonstration, Handout Education comprehension: Patient verbalized understanding and returned demonstration  HOME EXERCISE PROGRAM: Patient was instructed today in a home exercise program today for post op shoulder range of motion. These included active assist shoulder flexion in sitting, scapular retraction, wall walking with shoulder abduction, and hands behind head external rotation.  She was encouraged to do these twice a day, holding 3 seconds and repeating 5 times when permitted by her physician.   ASSESSMENT:  CLINICAL IMPRESSION: Pts multidisciplinary medical team met prior to her assessments to determine a recommended treatment plan. She is planning to have a right lumpectomy, however, after her recent MRI she is to talk with MD again to see if treatment plan should change . She will  benefit from a post op PT reassessment to determine needs and from L-Dex screens every 3 months for 2 years to detect subclinical lymphedema. She is presently set up to rturn in 3 weeks, however, pt will contact me if she switches to a Mastectomy and we will change to 4 weeks.  Pt will benefit from skilled therapeutic intervention to improve on the following deficits: Decreased knowledge of precautions, impaired UE functional use, pain, decreased ROM, postural dysfunction.   PT treatment/interventions: ADL/self-care home management, pt/family education,  therapeutic exercise  REHAB POTENTIAL: Excellent  CLINICAL DECISION MAKING: Stable/uncomplicated  EVALUATION COMPLEXITY: Low   GOALS: Goals reviewed with patient? YES  LONG TERM GOALS: (STG=LTG)    Name Target Date Goal status  1 Pt will be able to verbalize understanding of pertinent lymphedema risk reduction practices relevant to her dx specifically related to skin care.  Baseline:  No knowledge 09/25/2023 Achieved at eval  2 Pt will be able to return demo and/or verbalize understanding of the post op HEP related to regaining shoulder ROM. Baseline:  No knowledge 09/25/2023 Achieved at eval  3 Pt will be able to verbalize understanding of the importance of viewing the post op After Breast CA Class video for further lymphedema risk reduction education and therapeutic exercise.  Baseline:  No knowledge 09/25/2023 Achieved at eval  4 Pt will demo she has regained full shoulder ROM and function post operatively compared to baselines.  Baseline: See objective measurements taken today. 11/06/2023 NEW    PLAN:  PT FREQUENCY/DURATION: EVAL and 1 follow up appointment.   PLAN FOR NEXT SESSION: will reassess 3-4 weeks post op to determine needs.   Patient will follow up at outpatient cancer rehab 3-4 weeks following surgery.  If the patient requires physical therapy at that time, a specific plan will be dictated and sent to the referring physician for approval. The patient was educated today on appropriate basic range of motion exercises to begin post operatively and the importance of viewing the After Breast Cancer class video following surgery.  Patient was educated today on lymphedema risk reduction practices as it pertains to recommendations that will benefit the patient immediately following surgery.  She verbalized good understanding.    Physical Therapy Information for After Breast Cancer Surgery/Treatment:  Lymphedema is a swelling condition that you may be at risk for in your arm if you  have lymph nodes removed from the armpit area.  After a sentinel node biopsy, the risk is approximately 5-9% and is higher after an axillary node dissection.  There is treatment available for this condition and it is not life-threatening.  Contact your physician or physical therapist with concerns. You may begin the 4 shoulder/posture exercises (see additional sheet) when permitted by your physician (typically a week after surgery).  If you have drains, you may need to wait until those are removed before beginning range of motion exercises.  A general recommendation is to not lift your arms above shoulder height until drains are removed.  These exercises should be done to your tolerance and gently.  This is not a "no pain/no gain" type of recovery so listen to your body and stretch into the range of motion that you can tolerate, stopping if you have pain.  If you are having immediate reconstruction, ask your plastic surgeon about doing exercises as he or she may want you to wait. We encourage you to view the After Breast Cancer class video following surgery.  You will learn information related to  lymphedema risk, prevention and treatment and additional exercises to regain mobility following surgery.   While undergoing any medical procedure or treatment, try to avoid blood pressure being taken or needle sticks from occurring on the arm on the side of cancer.   This recommendation begins after surgery and continues for the rest of your life.  This may help reduce your risk of getting lymphedema (swelling in your arm). An excellent resource for those seeking information on lymphedema is the National Lymphedema Network's web site. It can be accessed at www.lymphnet.org If you notice swelling in your hand, arm or breast at any time following surgery (even if it is many years from now), please contact your doctor or physical therapist to discuss this.  Lymphedema can be treated at any time but it is easier for you if  it is treated early on.  If you feel like your shoulder motion is not returning to normal in a reasonable amount of time, please contact your surgeon or physical therapist.  [facility] [phone]. [address], [place] [address]  [facility] After Breast Cancer Class  After Breast Cancer Class is a specially designed exercise class video to assist you in a safe recover after having breast cancer surgery.  In this video you will learn how to get back to full function whether your drains were just removed or if you had surgery a month ago. The video can be viewed on this page: [email or web address]-meyer.[email or web address]/ or on YouTube here: [email or web address].  Class Goals  Understand specific stretches to improve the flexibility of you chest and shoulder. Learn ways to safely strengthen your upper body and improve your posture. Understand the warning signs of infection and why you may be at risk for an arm infection. Learn about Lymphedema and prevention.  ** You do not need to view this video until after surgery.  Drains should be removed to participate in the recommended exercises on the video.  Patient was instructed today in a home exercise program today for post op shoulder range of motion. These included active assist shoulder flexion in sitting, scapular retraction, wall walking with shoulder abduction, and hands behind head external rotation.  She was encouraged to do these twice a day, holding 3 seconds and repeating 5 times when permitted by her physician.    Latisha Poland, PT 09/25/2023, 4:18 PM

## 2023-09-24 NOTE — Assessment & Plan Note (Addendum)
 09/12/2023: Palpable lump right breast with dimpling of the overlying skin for 2 months, mammogram and ultrasound: 1.1 cm asymmetry/mass UOQ right breast, axilla negative: Biopsy: Grade 2 invasive lobular cancer ER 0%, PR 0%, HER2 2+ by IHC, FISH negative ratio 1  Pathology and radiology counseling: Discussed with the patient, the details of pathology including the type of breast cancer,the clinical staging, the significance of ER, PR and HER-2/neu receptors and the implications for treatment. After reviewing the pathology in detail, we proceeded to discuss the different treatment options between surgery, radiation, and chemotherapy.  Recommendation: Breast conserving surgery with sentinel lymph node biopsy Repeat prognostic testing on the final pathology to determine adjuvant treatment plan. If the final pathology is truly triple negative then she will be considered for Taxotere and Cytoxan every 3 weeks x 4 Adjuvant radiation therapy Breast MRI being ordered  Return to clinic after surgery to discuss final pathology report

## 2023-09-25 ENCOUNTER — Ambulatory Visit: Attending: General Surgery

## 2023-09-25 ENCOUNTER — Other Ambulatory Visit: Payer: Self-pay

## 2023-09-25 DIAGNOSIS — C50411 Malignant neoplasm of upper-outer quadrant of right female breast: Secondary | ICD-10-CM | POA: Diagnosis not present

## 2023-09-25 DIAGNOSIS — R293 Abnormal posture: Secondary | ICD-10-CM | POA: Insufficient documentation

## 2023-09-25 DIAGNOSIS — Z171 Estrogen receptor negative status [ER-]: Secondary | ICD-10-CM | POA: Insufficient documentation

## 2023-09-25 NOTE — Progress Notes (Signed)
 Radiation Oncology         ([phone]) [phone] ________________________________  Initial outpatient Consultation  Name: Alyssa Robinson MRN: [id number]  Date: 09/26/2023  DOB: 11-17-1950  GN:[id number], Marjory Signs, MD  Cameron Cea, MD   REFERRING PHYSICIAN: Cameron Cea, MD  DIAGNOSIS: No diagnosis found.  Malignant neoplasm of upper-outer quadrant of right breast in female, estrogen receptor negative (HCC) Stage IB (cT1c, cN0, cM0, G2, ER-, PR-, HER2-)   Cancer Staging  Malignant neoplasm of upper-outer quadrant of right breast in female, estrogen receptor negative (HCC) Staging form: Breast, AJCC 8th Edition - Clinical: Stage IB (cT1c, cN0, cM0, G2, ER-, PR-, HER2-) - Signed by Cameron Cea, MD on 09/24/2023 Histologic grading system: 3 grade system   CHIEF COMPLAINT: Here to discuss management of right breast cancer  HISTORY OF PRESENT ILLNESS::Alyssa Robinson is a 73 y.o. female who presented with a right breast lump. She underwent a bilateral diagnostic mammogram on the date of 09/12/23. Ultrasound of breast revealed an irregular hypoechoic mass with vague and angular margins at 10 o'clock 8 cm from the nipple at posterior depth measuring approximately 1.1 x 0.8 x 1.0 cm, demonstrating posterior acoustic shadowing and demonstrating internal power Doppler flow.  No pathologic right axillary lymphadenopathy. Of note: screening mammogram preformed in February 2025 was normal.   She does have a family history of breast cancer in a sister at age 31, maternal grandmother. Genetic testing has been initiated to assess for potential hereditary cancer syndromes.      In light of findings, she underwent a core needle biopsy on date of 09/12/23 showed invasive mammary carcinoma. lymphovascular invasion and calcifications not identified. Tumor size of 1.1 cm; ER status: 0%; PR status 0%, Her2 status 2+; Grade 2. Ki67: 10%   Patient was then referred to Dr. Delane Fear on 09/21/23 to discuss treatment  options. They discussed undergoing surgery vs chemotherapy and radiation.   Most recent visit with Dr. Gudena on 09/24/23, he recommended adjuvant chemotherapy with Taxotere and Cytoxan every 3 weeks x 4 and breast conserving surgery with sentinel lymph node biops followed by radiation therapy.   PREVIOUS RADIATION THERAPY: No  PAST MEDICAL HISTORY:  has a past medical history of Allergy (20-25 years ago), Anemia, Arthritis (8-10 years ago), Blood transfusion without reported diagnosis (20 years ago), Bronchitis, Bronchitis, Closed patellar sleeve fracture of left knee, Diverticulosis, GERD (gastroesophageal reflux disease), Headache(784.0), Hypertension, Pneumonia, and Seasonal allergies.    PAST SURGICAL HISTORY: Past Surgical History:  Procedure Laterality Date   ABDOMINAL HYSTERECTOMY     73 y/o   APPENDECTOMY  03/17/2013   Procedure: INCIDENTAL APPENDECTOMY;  Surgeon: Diantha Fossa, MD;  Location: [facility] OR;  Service: General;;   BREAST BIOPSY Right 09/12/2023   US  RT BREAST BX W LOC DEV 1ST LESION IMG BX SPEC US  GUIDE 09/12/2023 [facility]   capsule endoscopy     COLON SURGERY     COLONOSCOPY     in 2012 -    COLOSTOMY CLOSURE  03/17/2013   COLOSTOMY REVERSAL  03/17/2013   Procedure: COLOSTOMY REVERSAL;  Surgeon: Diantha Fossa, MD;  Location: [facility] OR;  Service: General;;   EUS N/A 04/30/2015   Procedure: UPPER ENDOSCOPIC ULTRASOUND (EUS) LINEAR;  Surgeon: Alvis Jourdain, MD;  Location: [facility];  Service: Endoscopy;  Laterality: N/A;   HERNIA REPAIR     X2 2-4 y/o inguinal   JOINT REPLACEMENT  ORID Knee/right hip   2021/2022   LAPAROTOMY N/A 10/17/2012   Procedure:  EXPLORATORY LAPAROTOMY,  COLON RESECTION AND COLOSTOMY;  Surgeon: Diantha Fossa, MD;  Location: [facility] OR;  Service: General;  Laterality: N/A;   LIPOMA EXCISION Left    left arm   LYSIS OF ADHESION  03/17/2013   Procedure: LYSIS OF ADHESION;  Surgeon: Diantha Fossa, MD;  Location: [facility] OR;  Service: General;;   ORIF  PATELLA Left 02/20/2020   Procedure: OPEN REDUCTION INTERNAL (ORIF) FIXATION LEFT PATELLA;  Surgeon: Wes Hamman, MD;  Location: [facility];  Service: Orthopedics;  Laterality: Left;   SMALL INTESTINE SURGERY  Perforated colon   2014   TOTAL HIP ARTHROPLASTY Right 09/12/2021   Procedure: RIGHT TOTAL HIP ARTHROPLASTY ANTERIOR APPROACH;  Surgeon: Wes Hamman, MD;  Location: [facility] OR;  Service: Orthopedics;  Laterality: Right;  3-C   TOTAL HIP ARTHROPLASTY Left 02/23/2023   Procedure: LEFT TOTAL HIP ARTHROPLASTY ANTERIOR APPROACH;  Surgeon: Wes Hamman, MD;  Location: [facility] OR;  Service: Orthopedics;  Laterality: Left;  3-C   UPPER GASTROINTESTINAL ENDOSCOPY     2013 -    FAMILY HISTORY: family history includes Alcohol abuse in her father and mother; Arthritis in her mother; Breast cancer in her maternal grandfather and sister; Cancer in her maternal aunt, maternal aunt, and mother; Colon cancer in an other family member; Depression in her mother; Diabetes in her father; Early death in her father; Esophageal cancer in her mother; Heart disease in her father; Hypertension in her father; Pancreatic cancer in an other family member.  SOCIAL HISTORY:  reports that she has never smoked. She has never been exposed to tobacco smoke. She has never used smokeless tobacco. She reports that she does not currently use alcohol after a past usage of about 2.0 standard drinks of alcohol per week. She reports that she does not use drugs.  ALLERGIES: Caffeine, Fluad quadrivalent [influenza vac a&b sa adj quad], and Sodium lauryl sulfate  MEDICATIONS:  Current Outpatient Medications  Medication Sig Dispense Refill   fluconazole (DIFLUCAN) 150 MG tablet Take 150 mg by mouth daily. Take one tab now persist may take second tab in 3 days.     loratadine (CLARITIN) 10 MG tablet Take 10 mg by mouth daily.     miconazole  (MICOTIN) 2 % cream Apply 1 Application topically 2 (two) times daily.     Nutritional  Supplements (BOOST HIGH PROTEIN PO) Take 1 Container by mouth 3 (three) times daily with meals. Regular original boost 2 x daily.     polyethylene glycol powder (GLYCOLAX /MIRALAX ) 17 GM/SCOOP powder Take by mouth once. Daily (Patient taking differently: Take by mouth once. 2 teaspoons with orange juice every morning.)     Probiotic Product (ULTRAFLORA IMMUNE HEALTH PO) Take 1 capsule by mouth daily.     sodium chloride  (OCEAN) 0.65 % SOLN nasal spray Place 2-3 sprays into both nostrils 2 (two) times daily.     tobramycin (TOBREX) 0.3 % ophthalmic solution Place 1 drop into both eyes every 4 (four) hours.     No current facility-administered medications for this encounter.    REVIEW OF SYSTEMS: As above in HPI.   PHYSICAL EXAM:  vitals were not taken for this visit.   General: Alert and oriented, in no acute distress HEENT: Head is normocephalic. Extraocular movements are intact.  Heart: Regular in rate and rhythm with no murmurs, rubs, or gallops. Chest: Clear to auscultation bilaterally, with no rhonchi, wheezes, or rales. Abdomen: Soft, nontender, nondistended, with no rigidity or guarding. Extremities: No  cyanosis or edema. Skin: No concerning lesions. Musculoskeletal: symmetric strength and muscle tone throughout. Neurologic: Cranial nerves II through XII are grossly intact. No obvious focalities. Speech is fluent. Coordination is intact. Psychiatric: Judgment and insight are intact. Affect is appropriate. Breasts: *** . No other palpable masses appreciated in the breasts or axillae *** .    ECOG = ***  0 - Asymptomatic (Fully active, able to carry on all predisease activities without restriction)  1 - Symptomatic but completely ambulatory (Restricted in physically strenuous activity but ambulatory and able to carry out work of a light or sedentary nature. For example, light housework, office work)  2 - Symptomatic, <50% in bed during the day (Ambulatory and capable of all self  care but unable to carry out any work activities. Up and about more than 50% of waking hours)  3 - Symptomatic, >50% in bed, but not bedbound (Capable of only limited self-care, confined to bed or chair 50% or more of waking hours)  4 - Bedbound (Completely disabled. Cannot carry on any self-care. Totally confined to bed or chair)  5 - Death   Aurea Blossom MM, Creech RH, Tormey DC, et al. [phone]). "Toxicity and response criteria of the [facility]". Am. Hillard Lowes. Oncol. 5 (6): 649-55   LABORATORY DATA:   CBC    Component Value Date/Time   WBC 5.1 06/06/2023 0338   RBC 3.97 06/06/2023 0338   HGB 12.6 06/06/2023 0338   HCT 39.6 06/06/2023 0338   PLT 96 (L) 06/06/2023 0338   MCV 99.7 06/06/2023 0338   MCH 31.7 06/06/2023 0338   MCHC 31.8 06/06/2023 0338   RDW 13.2 06/06/2023 0338   LYMPHSABS 1.2 06/04/2023 0438   MONOABS 1.1 (H) 06/04/2023 0438   EOSABS 0.1 06/04/2023 0438   BASOSABS 0.0 06/04/2023 0438    CMP     Component Value Date/Time   NA 134 (L) 06/06/2023 0338   K 3.7 06/06/2023 0338   CL 104 06/06/2023 0338   CO2 22 06/06/2023 0338   GLUCOSE 90 06/06/2023 0338   BUN 6 (L) 06/06/2023 0338   CREATININE 0.47 06/06/2023 0338   CALCIUM  8.2 (L) 06/06/2023 0338   PROT 5.9 (L) 06/04/2023 0438   ALBUMIN  3.4 (L) 06/04/2023 0438   AST 19 06/04/2023 0438   ALT 13 06/04/2023 0438   ALKPHOS 59 06/04/2023 0438   BILITOT 0.7 06/04/2023 0438   GFR 84.50 07/07/2022 0905   GFRNONAA >60 06/06/2023 0338      RADIOGRAPHY: MR BREAST BILATERAL W WO CONTRAST INC CAD Result Date: 09/24/2023 CLINICAL DATA:  73 year old female with recently diagnosed UPPER OUTER RIGHT breast cancer. EXAM: BILATERAL BREAST MRI WITH AND WITHOUT CONTRAST TECHNIQUE: Multiplanar, multisequence MR images of both breasts were obtained prior to and following the intravenous administration of 5 ml of Vueway  Three-dimensional MR images were rendered by post-processing of the original MR data on an  independent workstation. The three-dimensional MR images were interpreted, and findings are reported in the following complete MRI report for this study. Three dimensional images were evaluated at the independent interpreting workstation using the DynaCAD thin client. COMPARISON:  Prior mammograms and ultrasounds FINDINGS: Breast composition: c. Heterogeneous fibroglandular tissue. Background parenchymal enhancement: Mild Right breast: A 1.6 x 1.4 x 1.2 cm (AP x transverse x CC) irregular enhancing posterior UPPER-OUTER RIGHT breast mass containing biopsy clip artifact is compatible with biopsy-proven malignancy (image 45: Series 12). There is scattered areas of non masslike enhancement within the UPPER OUTER RIGHT breast  anterior to biopsy-proven malignancy extending to the anterior UPPER-OUTER RIGHT breast, spanning a distance of 9.6 cm which includes the biopsy-proven malignancy. No other suspicious findings within the RIGHT breast identified. Left breast: No suspicious mass or worrisome enhancement. Lymph nodes: No abnormal appearing lymph nodes. Ancillary findings:  Pectus excavatum noted. IMPRESSION: 1. 1.6 cm biopsy-proven malignancy within the posterior UPPER-OUTER RIGHT breast. 2. Scattered non masslike enhancement within the UPPER-OUTER RIGHT breast extending from the biopsy-proven malignancy to the anterior UPPER-OUTER RIGHT breast. Biopsy-proven RIGHT breast malignancy and non masslike enhancement span a distance of 9.6 cm. If breast conservation is desired, tissue sampling of the anterior (66:12) and middle depth (62:12) areas of non masslike enhancement recommended. 3. No abnormal appearing lymph node. No MR evidence of LEFT breast malignancy. RECOMMENDATION: 1. If breast conservation is desired, tissue sampling of the anterior and mid depth areas of UPPER-OUTER RIGHT breast non masslike enhancement recommended. BI-RADS CATEGORY  4: Suspicious. Electronically Signed   By: Sundra Engel M.D.   On:  09/24/2023 14:24   US  RT BREAST BX W LOC DEV 1ST LESION IMG BX SPEC US  GUIDE Addendum Date: 09/18/2023 ADDENDUM REPORT: 09/18/2023 07:52 ADDENDUM: PATHOLOGY revealed: 1. Breast, right, needle core biopsy, 10:00; 8 cm (coil clip) - INVASIVE MAMMARY CARCINOMA - GRADE 2 - LYMPHOVASCULAR INVASION: NOT IDENTIFIED - CANCER LENGTH: 0.7 - CALCIFICATIONS: NOT IDENTIFIED. Pathology results are CONCORDANT with imaging findings, per Dr. Marvene Slipper. Pathology results and recommendations were discussed with patient via telephone on 09/13/2023 by Ladonna Pickup RN. Patient reported biopsy site doing well with no adverse symptoms, and slight tenderness at the site. Post biopsy care instructions were reviewed, questions were answered and my direct phone number was provided. Patient was instructed to call [facility] for any additional questions or concerns related to biopsy site. RECOMMENDATIONS: 1. Surgical and oncological consultation. Request for surgical consultation relayed to Alphonse Asal at [facility] on 09/13/2023 by Ladonna Pickup RN, with specific patient request to see Dr. Enid Harry. Patient has appointment scheduled with Dr. Enid Harry on 09/21/2023 at 10:30. Pathology results reported by Ladonna Pickup RN on 09/13/2023. Electronically Signed   By: Rinda Cheers M.D.   On: 09/18/2023 07:52   Result Date: 09/18/2023 CLINICAL DATA:  73 year old with a palpable highly suspicious 1.1 cm mass in the UPPER OUTER QUADRANT of the RIGHT breast at 10 o'clock 8 cm from the nipple. EXAM: ULTRASOUND GUIDED RIGHT BREAST CORE NEEDLE BIOPSY COMPARISON:  Previous exam(s). PROCEDURE: I met with the patient and we discussed the procedure of ultrasound-guided biopsy, including benefits and alternatives. We discussed the high likelihood of a successful procedure. We discussed the risks of the procedure, including infection, bleeding, tissue injury, clip migration, and inadequate sampling.  Informed written consent was given. The usual time-out protocol was performed immediately prior to the procedure. Lesion quadrant: UPPER OUTER QUADRANT. Using sterile technique with chlorhexidine  as skin antisepsis 1% lidocaine  and 1% lidocaine  with epinephrine as local anesthetic, under direct ultrasound visualization, a 12 gauge Bard Marquee core needle device placed an 11 gauge introducer needle was used to perform biopsy of the mass in the UPPER OUTER QUADRANT using an inferolateral approach. At the conclusion of the procedure, a coil shaped tissue marker clip was deployed into the biopsy cavity. The patient tolerated the procedure well without apparent immediate complications. Follow up 2 view mammogram was performed in order to confirm the placement and was dictated separately. IMPRESSION: Ultrasound guided core needle biopsy of a highly  suspicious 1.1 cm mass involving the UPPER OUTER QUADRANT of the RIGHT breast. Electronically Signed: By: Rinda Cheers M.D. On: 09/12/2023 15:32   MM CLIP PLACEMENT RIGHT Result Date: 09/12/2023 CLINICAL DATA:  Confirmation clip placement after ultrasound-guided core needle biopsy of a highly suspicious mass in the UPPER OUTER QUADRANT of the RIGHT breast. EXAM: 2D and 3D DIAGNOSTIC RIGHT MAMMOGRAM POST ULTRASOUND BIOPSY COMPARISON:  Previous exam(s). ACR Breast Density Category c: The breasts are heterogeneously dense, which may obscure small masses. FINDINGS: 2D and 3D full field CC and mediolateral mammographic images were obtained following ultrasound guided biopsy of a highly suspicious mass in the UPPER OUTER QUADRANT of the RIGHT breast at posterior depth. The coil shaped tissue marking clip is appropriately positioned within the biopsied mass in the UPPER OUTER QUADRANT at posterior depth. Expected post biopsy changes are present without evidence of hematoma. IMPRESSION: Appropriate positioning of the coil shaped tissue marking clip within the biopsied mass in  the UPPER OUTER QUADRANT of the RIGHT breast at posterior depth. Final Assessment: Post Procedure Mammograms for Marker Placement Electronically Signed   By: Rinda Cheers M.D.   On: 09/12/2023 15:31   MM 3D DIAGNOSTIC MAMMOGRAM UNILATERAL RIGHT BREAST Result Date: 09/12/2023 CLINICAL DATA:  73 year old who presents with a possible non tender palpable lump in the UPPER OUTER QUADRANT of the RIGHT breast with dimpling of the overlying skin which she initially noticed approximately 2 months ago. Family history of breast cancer in her sister and in her maternal grandfather. EXAM: DIGITAL DIAGNOSTIC UNILATERAL RIGHT MAMMOGRAM WITH TOMOSYNTHESIS AND CAD; ULTRASOUND RIGHT BREAST LIMITED TECHNIQUE: Right digital diagnostic mammography and breast tomosynthesis was performed. The images were evaluated with computer-aided detection. ; Targeted ultrasound examination of the right breast was performed COMPARISON:  Previous exams from [facility] in [place], [place] , most recently screening mammography 06/18/2023. ACR Breast Density Category c: The breasts are heterogeneously dense, which may obscure small masses. FINDINGS: Full field CC and MLO views and a spot tangential view of the palpable concern were obtained. Corresponding to the palpable concern in the UPPER OUTER QUADRANT posterior depth is an asymmetry/mass associated with architectural distortion on the order of 1 cm in size. There are no associated suspicious calcifications. No new or suspicious findings elsewhere. Targeted ultrasound is performed in the area of palpable concern, demonstrating an irregular hypoechoic mass with vague and angular margins at 10 o'clock 8 cm from the nipple at posterior depth measuring approximately 1.1 x 0.8 x 1.0 cm, demonstrating posterior acoustic shadowing and demonstrating internal power Doppler flow. Sonographic evaluation of the axilla demonstrates no pathologic lymphadenopathy. On correlative physical  examination, there is a firm palpable approximate 1 cm mass in the UPPER OUTER QUADRANT corresponding what the patient is feeling and corresponding to the sonographic finding. There is dimpling of the skin of the UPPER OUTER QUADRANT when the patient raises her RIGHT arm. IMPRESSION: 1. Highly suspicious 1.1 cm mass involving the UPPER OUTER QUADRANT of the RIGHT breast at 10 o'clock 8 cm from nipple at posterior depth. 2. No pathologic RIGHT axillary lymphadenopathy. RECOMMENDATION: Ultrasound-guided core needle biopsy of the RIGHT breast mass. I have discussed the findings and recommendations with the patient. The ultrasound core needle biopsy procedure was discussed with the patient and questions were answered. She wishes to proceed with the biopsy which has been scheduled by the [facility] staff. BI-RADS CATEGORY  5: Highly suggestive of malignancy. Electronically Signed   By: Harman Lightning.D.  On: 09/12/2023 10:02   US  LIMITED ULTRASOUND INCLUDING AXILLA RIGHT BREAST Result Date: 09/12/2023 CLINICAL DATA:  73 year old who presents with a possible non tender palpable lump in the UPPER OUTER QUADRANT of the RIGHT breast with dimpling of the overlying skin which she initially noticed approximately 2 months ago. Family history of breast cancer in her sister and in her maternal grandfather. EXAM: DIGITAL DIAGNOSTIC UNILATERAL RIGHT MAMMOGRAM WITH TOMOSYNTHESIS AND CAD; ULTRASOUND RIGHT BREAST LIMITED TECHNIQUE: Right digital diagnostic mammography and breast tomosynthesis was performed. The images were evaluated with computer-aided detection. ; Targeted ultrasound examination of the right breast was performed COMPARISON:  Previous exams from [facility] in [place], [place] , most recently screening mammography 06/18/2023. ACR Breast Density Category c: The breasts are heterogeneously dense, which may obscure small masses. FINDINGS: Full field CC and MLO views and a spot  tangential view of the palpable concern were obtained. Corresponding to the palpable concern in the UPPER OUTER QUADRANT posterior depth is an asymmetry/mass associated with architectural distortion on the order of 1 cm in size. There are no associated suspicious calcifications. No new or suspicious findings elsewhere. Targeted ultrasound is performed in the area of palpable concern, demonstrating an irregular hypoechoic mass with vague and angular margins at 10 o'clock 8 cm from the nipple at posterior depth measuring approximately 1.1 x 0.8 x 1.0 cm, demonstrating posterior acoustic shadowing and demonstrating internal power Doppler flow. Sonographic evaluation of the axilla demonstrates no pathologic lymphadenopathy. On correlative physical examination, there is a firm palpable approximate 1 cm mass in the UPPER OUTER QUADRANT corresponding what the patient is feeling and corresponding to the sonographic finding. There is dimpling of the skin of the UPPER OUTER QUADRANT when the patient raises her RIGHT arm. IMPRESSION: 1. Highly suspicious 1.1 cm mass involving the UPPER OUTER QUADRANT of the RIGHT breast at 10 o'clock 8 cm from nipple at posterior depth. 2. No pathologic RIGHT axillary lymphadenopathy. RECOMMENDATION: Ultrasound-guided core needle biopsy of the RIGHT breast mass. I have discussed the findings and recommendations with the patient. The ultrasound core needle biopsy procedure was discussed with the patient and questions were answered. She wishes to proceed with the biopsy which has been scheduled by the [facility] staff. BI-RADS CATEGORY  5: Highly suggestive of malignancy. Electronically Signed   By: Rinda Cheers M.D.   On: 09/12/2023 10:02      IMPRESSION/PLAN: ***   It was a pleasure meeting the patient today. We discussed the risks, benefits, and side effects of radiotherapy. I recommend radiotherapy to the *** to reduce her risk of locoregional recurrence by  2/3.  We discussed that radiation would take approximately *** weeks to complete and that I would give the patient a few weeks to heal following surgery before starting treatment planning. *** If chemotherapy were to be given, this would precede radiotherapy. We spoke about acute effects including skin irritation and fatigue as well as much less common late effects including internal organ injury or irritation. We spoke about the latest technology that is used to minimize the risk of late effects for patients undergoing radiotherapy to the breast or chest wall. No guarantees of treatment were given. The patient is enthusiastic about proceeding with treatment. I look forward to participating in the patient's care.  I will await her referral back to me for postoperative follow-up and eventual CT simulation/treatment planning.  On date of service, in total, I spent *** minutes on this encounter. Patient was seen in  person.   __________________________________________   Colie Dawes, MD  This document serves as a record of services personally performed by Colie Dawes, MD. It was created on her behalf by Lucky Sable, a trained medical scribe. The creation of this record is based on the scribe's personal observations and the provider's statements to them. This document has been checked and approved by the attending provider.

## 2023-09-25 NOTE — Progress Notes (Signed)
 Location of Breast Cancer:  Malignant neoplasm of upper-outer quadrant of right breast in female, estrogen receptor negative (HCC)   Histology per Pathology Report:    Receptor Status: ER(Negative), PR (Negative), Her2-neu (Negative), Ki-67(10%)  Did patient present with symptoms (if so, please note symptoms) or was this found on screening mammography?:  Gudena, MD Patient discovered a lump in her left breast with skin dimpling  Past/Anticipated interventions by surgeon, if any:{t:21944} ***  Past/Anticipated interventions by medical oncology, if any:  09/24/2023 Gudena,MD  Lymphedema issues, if any:  {:18581} {t:21944}   Pain issues, if any:  {:18581} {PAIN DESCRIPTION:21022940}  SAFETY ISSUES: Prior radiation? {:18581} Pacemaker/ICD? {:18581} Possible current pregnancy?{:18581} Is the patient on methotrexate? {:18581}  Current Complaints / other details:  ***

## 2023-09-26 ENCOUNTER — Ambulatory Visit
Admission: RE | Admit: 2023-09-26 | Discharge: 2023-09-26 | Disposition: A | Discharge status: Home / Self Care | Source: Ambulatory Visit | Attending: Radiation Oncology | Admitting: Radiation Oncology

## 2023-09-26 ENCOUNTER — Encounter: Payer: Self-pay | Admitting: Radiation Oncology

## 2023-09-26 ENCOUNTER — Inpatient Hospital Stay

## 2023-09-26 VITALS — BP 158/88 | HR 76 | Temp 96.8°F | Resp 18 | Ht 59.0 in | Wt 106.4 lb

## 2023-09-26 DIAGNOSIS — Z171 Estrogen receptor negative status [ER-]: Secondary | ICD-10-CM

## 2023-09-26 DIAGNOSIS — C50411 Malignant neoplasm of upper-outer quadrant of right female breast: Secondary | ICD-10-CM | POA: Diagnosis not present

## 2023-09-26 LAB — RESEARCH LABS

## 2023-09-26 NOTE — Research (Signed)
 [employer] 2021-05 - Specimen Collection Study to Evaluate Biomarkers in Subjects with Cancer   This Nurse has reviewed this patient's inclusion and exclusion criteria as a second review and confirms Alyssa Robinson is eligible for study participation.  Patient may continue with enrollment.  Victory Gravel Kaj Vasil, RN, BSN, [facility] She  Her  Hers Clinical Research Nurse [facility] Direct Dial [phone] 09/26/2023 9:46 AM

## 2023-09-26 NOTE — Research (Deleted)
 [employer] 2021-05 - Specimen Collection Study to Evaluate Biomarkers in Subjects with Cancer    This Nurse has reviewed this patient's inclusion and exclusion criteria as a second review and confirms Alyssa Robinson is eligible for study participation.  Patient may continue with enrollment.  [name], RN, BSN, [facility] She  Her  Hers Clinical Research Nurse [facility] Direct Dial [phone] 09/26/2023 9:42 AM

## 2023-09-26 NOTE — Research (Signed)
 [employer] 2021-05 - Specimen Collection Study to Evaluate Biomarkers in Subjects with Cancer     Patient Alyssa Robinson was identified by Dr. Gudena as a potential candidate for the above listed study.  This Clinical Research Coordinator met with Alyssa Robinson, Alyssa Robinson, on 09/26/23 in a manner and location that ensures patient privacy to discuss participation in the above listed research study.  Patient is Unaccompanied.  A copy of the informed consent document with embedded HIPAA language was provided to the patient.  Patient reads, speaks, and understands [place].    Patient was provided with the business card of this Coordinator and encouraged to contact the research team with any questions.  Patient was provided the option of taking informed consent documents home to review and was encouraged to review at their convenience with their support network, including other care providers. Patient is comfortable with making a decision regarding study participation today.  As outlined in the informed consent form, this Coordinator and HERAN CAMPAU discussed the purpose of the research study, the investigational nature of the study, study procedures and requirements for study participation, potential risks and benefits of study participation, as well as alternatives to participation. This study is not blinded. The patient understands participation is voluntary and they may withdraw from study participation at any time.  This study does not involve randomization.  This study does not involve an investigational drug or device. This study does not involve a placebo. Patient understands enrollment is pending full eligibility review.   Confidentiality and how the patient's information will be used as part of study participation were discussed.  Patient was informed there is reimbursement provided for their time and effort spent on trial participation.  The patient is encouraged to discuss research study  participation with their insurance provider to determine what costs they may incur as part of study participation, including research related injury.    All questions were answered to patient's satisfaction.  The informed consent with embedded HIPAA language was reviewed page by page.  The patient's mental and emotional status is appropriate to provide informed consent, and the patient verbalizes an understanding of study participation.  Patient has agreed to participate in the above listed research study and has voluntarily signed the informed consent version 07 May 2020 Revised 23 May 2021 with embedded HIPAA language, version   07 May 2020 Revised 23 May 2021 on 09/26/23 at 9:30 AM.  The patient was provided with a copy of the signed informed consent form with embedded HIPAA language for their reference.  No study specific procedures were obtained prior to the signing of the informed consent document.  Approximately 15 minutes were spent with the patient reviewing the informed consent documents.  Patient was not requested to complete a Release of Information form.   Eligibility: Eligibility criteria reviewed with patient. This nurse/coordinator has reviewed this patient's inclusion and exclusion criteria and confirmed patient is eligible for study participation. Eligibility confirmed by treating investigator Dr. Gudena, who also agrees that patient should proceed with enrollment.  Patient will continue with enrollment.  Data Collection: Patient was interviewed to collect the following information.  Medical History:  High Blood Pressure  Yes ;patient mentions she has pre-hypertension Coronary Artery Disease No Lupus    No Rheumatoid Arthritis  No; Patient mentions having athritis in neck and knees but not sure which kind of arthritis. May/may not be Rheumatoid. Checked patient chart .Aaron AasDid not see a medical history of Rheumatoid arthritis listed. Diabetes  No       Lynch Syndrome  No  Is the  patient currently taking a magnesium  supplement?   No   Does the patient have a personal history of cancer (greater than 5 years ago)?  No  Does the patient have a family history of cancer in 1st or 2nd degree relatives? Yes If yes, Relationship(s) and Cancer type(s)? ; maternal grandma- breastMother- uterian, esophageal; mom aunt colon, another mom aunt pancreatic  Does the patient have history of alcohol consumption? No    Does the patient have history of cigarette, cigar, pipe, or chewing tobacco use?  No    Blood Collection: Research blood obtained by fresh venipuncture with Fidel Huddle, Research Specialist. Patient tolerated well without any adverse events.  Gift Card: $50 gift card given to patient for her participation in this study.    Patient was thanked for their participation in this study.    Pinkey Brier, MPH  Clinical Research Coordinator

## 2023-09-26 NOTE — Research (Deleted)
 Note entered in error

## 2023-09-27 ENCOUNTER — Other Ambulatory Visit: Payer: Self-pay | Admitting: General Surgery

## 2023-09-27 ENCOUNTER — Encounter: Payer: Self-pay | Admitting: *Deleted

## 2023-09-27 ENCOUNTER — Telehealth: Payer: Self-pay

## 2023-09-27 DIAGNOSIS — R9389 Abnormal findings on diagnostic imaging of other specified body structures: Secondary | ICD-10-CM

## 2023-10-01 DIAGNOSIS — L820 Inflamed seborrheic keratosis: Secondary | ICD-10-CM | POA: Diagnosis not present

## 2023-10-01 DIAGNOSIS — D1801 Hemangioma of skin and subcutaneous tissue: Secondary | ICD-10-CM | POA: Diagnosis not present

## 2023-10-01 DIAGNOSIS — L814 Other melanin hyperpigmentation: Secondary | ICD-10-CM | POA: Diagnosis not present

## 2023-10-10 ENCOUNTER — Ambulatory Visit
Admission: RE | Admit: 2023-10-10 | Discharge: 2023-10-10 | Disposition: A | Discharge status: Home / Self Care | Source: Ambulatory Visit | Attending: General Surgery | Admitting: General Surgery

## 2023-10-10 DIAGNOSIS — C50411 Malignant neoplasm of upper-outer quadrant of right female breast: Secondary | ICD-10-CM | POA: Diagnosis not present

## 2023-10-10 DIAGNOSIS — R9389 Abnormal findings on diagnostic imaging of other specified body structures: Secondary | ICD-10-CM

## 2023-10-10 DIAGNOSIS — N6031 Fibrosclerosis of right breast: Secondary | ICD-10-CM | POA: Diagnosis not present

## 2023-10-10 DIAGNOSIS — C50911 Malignant neoplasm of unspecified site of right female breast: Secondary | ICD-10-CM | POA: Diagnosis not present

## 2023-10-10 DIAGNOSIS — R928 Other abnormal and inconclusive findings on diagnostic imaging of breast: Secondary | ICD-10-CM | POA: Diagnosis not present

## 2023-10-10 MED ORDER — GADOPICLENOL 0.5 MMOL/ML IV SOLN
5.0000 mL | Freq: Once | INTRAVENOUS | Status: AC | PRN
  Administered 2023-10-10: 5 mL via INTRAVENOUS

## 2023-10-11 LAB — SURGICAL PATHOLOGY

## 2023-10-11 NOTE — Anesthesia Preprocedure Evaluation (Addendum)
 Anesthesia Evaluation  Patient identified by MRN, date of birth, ID band Patient awake    Reviewed: Allergy & Precautions, NPO status , Patient's Chart, lab work & pertinent test results  Airway Mallampati: II  TM Distance: >3 FB Neck ROM: Full    Dental  (+) Teeth Intact, Dental Advisory Given   Pulmonary neg pulmonary ROS   Pulmonary exam normal breath sounds clear to auscultation       Cardiovascular hypertension (167/78 preop, no home meds), Normal cardiovascular exam Rhythm:Regular Rate:Normal     Neuro/Psych  Headaches  negative psych ROS   GI/Hepatic Neg liver ROS,GERD  Controlled,,(+)     (-) substance abuse    Endo/Other  negative endocrine ROS    Renal/GU negative Renal ROS  negative genitourinary   Musculoskeletal  (+) Arthritis , Osteoarthritis,    Abdominal   Peds  Hematology negative hematology ROS (+)   Anesthesia Other Findings   Reproductive/Obstetrics negative OB ROS                             Anesthesia Physical Anesthesia Plan  ASA: 2  Anesthesia Plan: General and Regional   Post-op Pain Management: Tylenol  PO (pre-op)* and Regional block*   Induction: Intravenous  PONV Risk Score and Plan: 3 and Ondansetron , Dexamethasone  and Treatment may vary due to age or medical condition  Airway Management Planned: LMA  Additional Equipment: None  Intra-op Plan:   Post-operative Plan: Extubation in OR  Informed Consent: I have reviewed the patients History and Physical, chart, labs and discussed the procedure including the risks, benefits and alternatives for the proposed anesthesia with the patient or authorized representative who has indicated his/her understanding and acceptance.     Dental advisory given  Plan Discussed with: CRNA  Anesthesia Plan Comments:        Anesthesia Quick Evaluation

## 2023-10-12 ENCOUNTER — Other Ambulatory Visit: Payer: Self-pay | Admitting: General Surgery

## 2023-10-12 ENCOUNTER — Other Ambulatory Visit: Payer: Self-pay

## 2023-10-12 ENCOUNTER — Encounter (HOSPITAL_BASED_OUTPATIENT_CLINIC_OR_DEPARTMENT_OTHER): Payer: Self-pay | Admitting: General Surgery

## 2023-10-15 ENCOUNTER — Encounter: Payer: Self-pay | Admitting: Internal Medicine

## 2023-10-15 ENCOUNTER — Ambulatory Visit (INDEPENDENT_AMBULATORY_CARE_PROVIDER_SITE_OTHER): Admitting: Internal Medicine

## 2023-10-15 VITALS — BP 140/80 | HR 67 | Temp 98.0°F | Ht 59.0 in | Wt 107.0 lb

## 2023-10-15 DIAGNOSIS — I10 Essential (primary) hypertension: Secondary | ICD-10-CM

## 2023-10-15 DIAGNOSIS — Z171 Estrogen receptor negative status [ER-]: Secondary | ICD-10-CM | POA: Diagnosis not present

## 2023-10-15 DIAGNOSIS — C50411 Malignant neoplasm of upper-outer quadrant of right female breast: Secondary | ICD-10-CM

## 2023-10-15 DIAGNOSIS — Z0000 Encounter for general adult medical examination without abnormal findings: Secondary | ICD-10-CM

## 2023-10-15 NOTE — Progress Notes (Addendum)
   Subjective:   Patient ID: Alyssa Robinson, female    DOB: 1950/11/02, 73 y.o.   MRN: [id number]  HPI The patient is here for physical/medical management (see A/P)  PMH, [facility], social history reviewed and updated  Review of Systems  Constitutional: Negative.   HENT: Negative.    Eyes: Negative.   Respiratory:  Negative for cough, chest tightness and shortness of breath.   Cardiovascular:  Negative for chest pain, palpitations and leg swelling.  Gastrointestinal:  Negative for abdominal distention, abdominal pain, constipation, diarrhea, nausea and vomiting.  Musculoskeletal: Negative.   Skin: Negative.   Neurological: Negative.   Psychiatric/Behavioral: Negative.      Objective:  Physical Exam Constitutional:      Appearance: She is well-developed.  HENT:     Head: Normocephalic and atraumatic.   Cardiovascular:     Rate and Rhythm: Normal rate and regular rhythm.     Comments: Bruising right breast s/p biopsy, no rash or skin break down Pulmonary:     Effort: Pulmonary effort is normal. No respiratory distress.     Breath sounds: Normal breath sounds. No wheezing or rales.  Abdominal:     General: Bowel sounds are normal. There is no distension.     Palpations: Abdomen is soft.     Tenderness: There is no abdominal tenderness. There is no rebound.   Musculoskeletal:     Cervical back: Normal range of motion.   Skin:    General: Skin is warm and dry.   Neurological:     Mental Status: She is alert and oriented to person, place, and time.     Coordination: Coordination normal.     Vitals:   10/15/23 1538  BP: (!) 140/80  Pulse: 67  Temp: 98 F (36.7 C)  TempSrc: Oral  SpO2: 98%  Weight: 107 lb (48.5 kg)  Height: 4' 11 (1.499 m)    Assessment & Plan:  Visit time 20 minutes in face to face communication with patient and coordination of care, additional 10 minutes spent in record review, coordination or care, ordering tests, communicating/referring to other  healthcare professionals, documenting in medical records all on the same day of the visit for total time 30 minutes spent on the visit.

## 2023-10-17 ENCOUNTER — Ambulatory Visit: Payer: Self-pay

## 2023-10-17 ENCOUNTER — Encounter: Payer: Self-pay | Admitting: Internal Medicine

## 2023-10-17 ENCOUNTER — Ambulatory Visit (INDEPENDENT_AMBULATORY_CARE_PROVIDER_SITE_OTHER): Admitting: Internal Medicine

## 2023-10-17 VITALS — BP 142/84 | HR 66 | Temp 97.8°F | Ht 59.0 in | Wt 102.6 lb

## 2023-10-17 DIAGNOSIS — I10 Essential (primary) hypertension: Secondary | ICD-10-CM | POA: Diagnosis not present

## 2023-10-17 DIAGNOSIS — R42 Dizziness and giddiness: Secondary | ICD-10-CM

## 2023-10-17 DIAGNOSIS — H6692 Otitis media, unspecified, left ear: Secondary | ICD-10-CM

## 2023-10-17 MED ORDER — AZITHROMYCIN 250 MG PO TABS: ORAL_TABLET | ORAL | 0 refills | Status: DC

## 2023-10-17 NOTE — Assessment & Plan Note (Signed)
 Getting surgery next week no signs of infection or skin breakdown in biopsy area.

## 2023-10-17 NOTE — Progress Notes (Signed)
 Patient ID: Alyssa Robinson, female   DOB: 10/04/1950, 73 y.o.   MRN: [id number]        Chief Complaint: follow up left otitis, vertigo, htn       HPI:  Alyssa Robinson is a 73 y.o. female here with c/o 3 days onset left ear pain pressure feverish and mild intermittent vertigo like dizzines.  Pt denies chest pain, increased sob or doe, wheezing, orthopnea, PND, increased LE swelling, palpitations, dizziness or syncope.   Pt denies polydipsia, polyuria, or new focal neuro s/s.    Pt denies recent wt loss, night sweats, loss of appetite, or other constitutional symptoms   Scheduled for right mastectomy June 30, working on better diet with hx of small bowel surgury with colostomy takedown.   Wt Readings from Last 3 Encounters:  10/17/23 102 lb 9.6 oz (46.5 kg)  10/15/23 107 lb (48.5 kg)  09/26/23 106 lb 6.4 oz (48.3 kg)   BP Readings from Last 3 Encounters:  10/17/23 (!) 142/84  10/15/23 (!) 140/80  09/26/23 (!) 158/88         Past Medical History:  Diagnosis Date   Allergy 20-25 years ago   Anemia    hx   Arthritis 8-10 years ago   Blood transfusion without reported diagnosis 20 years ago   Bronchitis    Bronchitis    hx   Closed patellar sleeve fracture of left knee    Diverticulosis    GERD (gastroesophageal reflux disease)    occ   Headache(784.0)    migraines occ   Hypertension    no meds in over 83yrs   Pneumonia    Seasonal allergies    Past Surgical History:  Procedure Laterality Date   ABDOMINAL HYSTERECTOMY     73 y/o   APPENDECTOMY  03/17/2013   Procedure: INCIDENTAL APPENDECTOMY;  Surgeon: Lynwood MALVA Pina, MD;  Location: [facility] OR;  Service: General;;   BREAST BIOPSY Right 09/12/2023   US  RT BREAST BX W LOC DEV 1ST LESION IMG BX SPEC US  GUIDE 09/12/2023 [facility]   capsule endoscopy     COLON SURGERY     COLONOSCOPY     in 2012 -    COLOSTOMY CLOSURE  03/17/2013   COLOSTOMY REVERSAL  03/17/2013   Procedure: COLOSTOMY REVERSAL;  Surgeon: Lynwood MALVA Pina, MD;   Location: [facility] OR;  Service: General;;   EUS N/A 04/30/2015   Procedure: UPPER ENDOSCOPIC ULTRASOUND (EUS) LINEAR;  Surgeon: Belvie Just, MD;  Location: [facility] END[facility]COPY;  Service: Endoscopy;  Laterality: N/A;   HERNIA REPAIR     X2 2-73 y/o inguinal   JOINT REPLACEMENT  ORID Knee/right hip   2021/2022   LAPAROTOMY N/A 10/17/2012   Procedure: EXPLORATORY LAPAROTOMY,  COLON RESECTION AND COLOSTOMY;  Surgeon: Lynwood MALVA Pina, MD;  Location: [facility] OR;  Service: General;  Laterality: N/A;   LIPOMA EXCISION Left    left arm   LYSIS OF ADHESION  03/17/2013   Procedure: LYSIS OF ADHESION;  Surgeon: Lynwood MALVA Pina, MD;  Location: [facility] OR;  Service: General;;   ORIF PATELLA Left 02/20/2020   Procedure: OPEN REDUCTION INTERNAL (ORIF) FIXATION LEFT PATELLA;  Surgeon: Jerri Kay HERO, MD;  Location: [facility];  Service: Orthopedics;  Laterality: Left;   SMALL INTESTINE SURGERY  Perforated colon   2014   TOTAL HIP ARTHROPLASTY Right 09/12/2021   Procedure: RIGHT TOTAL HIP ARTHROPLASTY ANTERIOR APPROACH;  Surgeon: Jerri Kay HERO, MD;  Location: [facility] OR;  Service:  Orthopedics;  Laterality: Right;  3-C   TOTAL HIP ARTHROPLASTY Left 02/23/2023   Procedure: LEFT TOTAL HIP ARTHROPLASTY ANTERIOR APPROACH;  Surgeon: Jerri Kay HERO, MD;  Location: [facility] OR;  Service: Orthopedics;  Laterality: Left;  3-C   UPPER GASTROINTESTINAL ENDOSCOPY     2013 -    reports that she has never smoked. She has been exposed to tobacco smoke. She has never used smokeless tobacco. She reports that she does not currently use alcohol after a past usage of about 2.0 standard drinks of alcohol per week. She reports that she does not use drugs. family history includes Alcohol abuse in her father and mother; Arthritis in her mother; Breast cancer in her maternal grandfather and sister; Cancer in her maternal aunt, maternal aunt, and mother; Colon cancer in an other family member; Depression in her mother; Diabetes in her father; Early death in  her father; Esophageal cancer in her mother; Heart disease in her father; Hypertension in her father; Pancreatic cancer in an other family member. Allergies  Allergen Reactions   Caffeine Nausea And Vomiting and Palpitations    Makes heart race    Fluad Quadrivalent [Influenza Vac A&B Sa Adj Quad] Nausea And Vomiting    Pt allergic to the 65 and over version of the flu vaccine, tolerates regular flu vaccine    Sodium Lauryl Sulfate Rash    Swollen gums    Current Outpatient Medications on File Prior to Visit  Medication Sig Dispense Refill   loratadine (CLARITIN) 10 MG tablet Take 10 mg by mouth daily.     polyethylene glycol powder (GLYCOLAX /MIRALAX ) 17 GM/SCOOP powder Take by mouth once. Daily     Probiotic Product (ULTRAFLORA IMMUNE HEALTH PO) Take 1 capsule by mouth daily.     sodium chloride  (OCEAN) 0.65 % SOLN nasal spray Place 2-3 sprays into both nostrils 2 (two) times daily.     No current facility-administered medications on file prior to visit.        ROS:  All others reviewed and negative.  Objective        PE:  BP (!) 142/84   Pulse 66   Temp 97.8 F (36.6 C)   Ht 4' 11 (1.499 m)   Wt 102 lb 9.6 oz (46.5 kg)   BMI 20.72 kg/m                 Constitutional: Pt appears in NAD               HENT: Head: NCAT.                Right Ear: External ear normal.                 Left Ear: External ear normal. Left tm severe erythema with bulging               Eyes: . Pupils are equal, round, and reactive to light. Conjunctivae and EOM are normal               Nose: without d/c or deformity               Neck: Neck supple. Gross normal ROM               Cardiovascular: Normal rate and regular rhythm.                 Pulmonary/Chest: Effort normal and breath sounds without rales or wheezing.  Neurological: Pt is alert. At baseline orientation, motor grossly intact               Skin: Skin is warm. No rashes, no other new lesions, LE edema -  none               Psychiatric: Pt behavior is normal without agitation   Micro: none  Cardiac tracings I have personally interpreted today:  none  Pertinent Radiological findings (summarize): none   Lab Results  Component Value Date   WBC 5.1 06/06/2023   HGB 12.6 06/06/2023   HCT 39.6 06/06/2023   PLT 96 (L) 06/06/2023   GLUCOSE 90 06/06/2023   CHOL 205 (H) 07/07/2022   TRIG 63.0 07/07/2022   HDL 76.80 07/07/2022   LDLDIRECT 121.5 12/11/2012   LDLCALC 116 (H) 07/07/2022   ALT 13 06/04/2023   AST 19 06/04/2023   NA 134 (L) 06/06/2023   K 3.7 06/06/2023   CL 104 06/06/2023   CREATININE 0.47 06/06/2023   BUN 6 (L) 06/06/2023   CO2 22 06/06/2023   TSH 2.16 07/01/2020   INR 1.20 10/17/2012   HGBA1C 5.5 06/04/2023   Assessment/Plan:  Alyssa Robinson is a 73 y.o. White or Caucasian [1] female with  has a past medical history of Allergy (20-25 years ago), Anemia, Arthritis (8-10 years ago), Blood transfusion without reported diagnosis (20 years ago), Bronchitis, Bronchitis, Closed patellar sleeve fracture of left knee, Diverticulosis, GERD (gastroesophageal reflux disease), Headache(784.0), Hypertension, Pneumonia, and Seasonal allergies.  Essential hypertension, benign BP Readings from Last 3 Encounters:  10/17/23 (!) 142/84  10/15/23 (!) 140/80  09/26/23 (!) 158/88   Uncontrolled, possibly reactive, pt declines change in tx for now,, pt to cont to work on diet, wt control  Vertigo Mild intermittent , for contd meclizine prn at home,  to f/u any worsening symptoms or concerns  Left otitis media Mild to mod, for antibx course zpack,  to f/u any worsening symptoms or concerns  Followup: Return if symptoms worsen or fail to improve.  Lynwood Rush, MD 10/20/2023 2:23 PM [facility]

## 2023-10-17 NOTE — Telephone Encounter (Signed)
       FYI Only or Action Required?: Action required by provider: request for appointment.  Patient was last seen in primary care on 10/15/2023 by Rollene Almarie LABOR, MD. Called Nurse Triage reporting Dizziness. Symptoms began several days ago. Interventions attempted: Rest, hydration, or home remedies. Symptoms are: unchanged. Dizziness comes and goes, mainly with walking.  Triage Disposition: See Physician Within 24 Hours  Patient/caregiver understands and will follow disposition?: YesCopied from CRM [id number]. Topic: Clinical - Red Word Triage >> Oct 17, 2023  9:23 AM Berneda FALCON wrote: Red Word that prompted transfer to Nurse Triage: Pt states she woke up this morning very dizzy. States she has breast cancer surgery on Monday. States she has ringing in her ears and vomitted. States her feet are very cold as well. Reason for Disposition  [1] MODERATE dizziness (e.g., interferes with normal activities) AND [2] has NOT been evaluated by doctor (or NP/PA) for this  (Exception: Dizziness caused by heat exposure, sudden standing, or poor fluid intake.)  Answer Assessment - Initial Assessment Questions 1. DESCRIPTION: Describe your dizziness.     dizzy 2. LIGHTHEADED: Do you feel lightheaded? (e.g., somewhat faint, woozy, weak upon standing)     yes 3. VERTIGO: Do you feel like either you or the room is spinning or tilting? (i.e. vertigo)     no 4. SEVERITY: How bad is it?  Do you feel like you are going to faint? Can you stand and walk?   - MILD: Feels slightly dizzy, but walking normally.   - MODERATE: Feels unsteady when walking, but not falling; interferes with normal activities (e.g., school, work).   - SEVERE: Unable to walk without falling, or requires assistance to walk without falling; feels like passing out now.      mild 5. ONSET:  When did the dizziness begin?     today 6. AGGRAVATING FACTORS: Does anything make it worse? (e.g., standing, change in head  position)     no 7. HEART RATE: Can you tell me your heart rate? How many beats in 15 seconds?  (Note: not all patients can do this)       no 8. CAUSE: What do you think is causing the dizziness?     unsure 9. RECURRENT SYMPTOM: Have you had dizziness before? If Yes, ask: When was the last time? What happened that time?     yes 10. OTHER SYMPTOMS: Do you have any other symptoms? (e.g., fever, chest pain, vomiting, diarrhea, bleeding)       Diarrhea  11. PREGNANCY: Is there any chance you are pregnant? When was your last menstrual period?       no  Protocols used: Dizziness - Lightheadedness-A-AH

## 2023-10-17 NOTE — Patient Instructions (Signed)
 Please take all new medication as prescribed - the antibiotic   Ok to take the meclizine at home if the excercises are not working well  Please continue all other medications as before, and refills have been done if requested.  Please have the pharmacy call with any other refills you may need.  Please keep your appointments with your specialists as you may have planned  Good Luck with your Surgury!

## 2023-10-17 NOTE — Assessment & Plan Note (Signed)
Flu shot yearly. Pneumonia complete. Shingrix complete. Tetanus up to date. Colonoscopy up to date. Mammogram up to date, pap smear aged out and dexa up to date. Counseled about sun safety and mole surveillance. Counseled about the dangers of distracted driving. Given 10 year screening recommendations.   

## 2023-10-17 NOTE — Assessment & Plan Note (Signed)
BP at goal off meds. Continue close monitoring.

## 2023-10-18 ENCOUNTER — Encounter: Payer: Self-pay | Admitting: *Deleted

## 2023-10-19 NOTE — Addendum Note (Signed)
 Addended by: ROLLENE ALMARIE LABOR on: 10/19/2023 07:23 AM   Modules accepted: Level of Service

## 2023-10-20 ENCOUNTER — Encounter: Payer: Self-pay | Admitting: Internal Medicine

## 2023-10-20 DIAGNOSIS — H6692 Otitis media, unspecified, left ear: Secondary | ICD-10-CM | POA: Insufficient documentation

## 2023-10-20 DIAGNOSIS — R42 Dizziness and giddiness: Secondary | ICD-10-CM | POA: Insufficient documentation

## 2023-10-20 NOTE — Assessment & Plan Note (Signed)
 BP Readings from Last 3 Encounters:  10/17/23 (!) 142/84  10/15/23 (!) 140/80  09/26/23 (!) 158/88   Uncontrolled, possibly reactive, pt declines change in tx for now,, pt to cont to work on diet, wt control

## 2023-10-20 NOTE — Assessment & Plan Note (Signed)
Mild to mod, for antibx course zpack,  to f/u any worsening symptoms or concerns 

## 2023-10-20 NOTE — Assessment & Plan Note (Signed)
 Mild intermittent , for contd meclizine prn at home,  to f/u any worsening symptoms or concerns

## 2023-10-21 NOTE — H&P (Signed)
 This is a a otherwise healthy 72 year old female who had a negative mammogram in late February. She has always had dense breast tissue. This is C category. After that she noticed a right upper outer quadrant mass. She had no discharge. She does have a family history of breast cancer in a sister at age 7, maternal grandmother. She has a history of esophageal and uterine cancer in her mother and pancreatic cancer in her maternal aunt. She has a history of a laparotomy for a sigmoid resection. She has had a couple of small bowel obstructions earlier this year but is doing well now. She had a mammogram and ultrasound that shows C density breast. She has a 1.1 cm right upper quadrant quadrant mass. She has no lymphadenopathy. 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 She is here with her husband to discuss options.  Review of Systems: A complete review of systems was obtained from the patient. I have reviewed this information and discussed as appropriate with the patient. See HPI as well for other ROS.  Review of Systems  Constitutional: Positive for weight loss.  HENT: Positive for tinnitus.  Gastrointestinal: Positive for constipation.  Musculoskeletal: Positive for neck pain.    Medical History: Past Medical History:  Diagnosis Date  Anemia  Arthritis  GERD (gastroesophageal reflux disease)  History of cancer  Hypertension   History reviewed. No pertinent surgical history.   Allergies  Allergen Reactions  Caffeine Nausea And Vomiting, Other (See Comments), Palpitations and Unknown  Makes heart race Influenza Virus Vaccines Nausea And Vomiting  Dizziness  Sodium Lauryl Sulfate Other (See Comments), Rash and Swelling  Swollen gums   Current Outpatient Medications on File Prior to Visit  Medication Sig Dispense Refill  L. acidophilus/Bifid. animalis (DAILY PROBIOTIC ORAL) Take by mouth  loratadine (CLARITIN ORAL) Claritin  sodium chloride  (AYR  SALINE NASAL) Ayr Saline Nasal    Family History  Problem Relation Age of Onset  High blood pressure (Hypertension) Mother  Hyperlipidemia (Elevated cholesterol) Mother  Coronary Artery Disease (Blocked arteries around heart) Mother  Diabetes Mother  Breast cancer Sister    Social History   Tobacco Use  Smoking Status Never  Smokeless Tobacco Never  Marital status: Married  Tobacco Use  Smoking status: Never  Smokeless tobacco: Never  Substance and Sexual Activity  Alcohol use: Never  Drug use: Never    Objective:   Vitals:  09/21/23 1018 09/21/23 1019  BP: 130/88  Temp: 36.7 C (98.1 F)  Weight: 48.3 kg (106 lb 6.4 oz)  Height: 149.9 cm (4' 11)  PainSc: 0-No pain   Body mass index is 21.49 kg/m.  Physical Exam Vitals reviewed.  Constitutional:  Appearance: Normal appearance.  Chest:  Breasts: Right: Mass present. No inverted nipple or nipple discharge.  Left: No inverted nipple, mass or nipple discharge.  Comments: 2 cm uoq mobile mass Lymphadenopathy:  Upper Body:  Right upper body: No supraclavicular or axillary adenopathy.  Left upper body: No supraclavicular or axillary adenopathy.  Neurological:  Mental Status: She is alert.     Assessment and Plan:    Malignant neoplasm of upper-outer quadrant of right breast in female, estrogen receptor negative (CMS/HHS-HCC)  Right mastectomy/sn biopsy  We discussed the staging and pathophysiology of breast cancer. We discussed all of the different options for treatment for breast cancer including surgery, chemotherapy, radiation therapy.  We discussed a sentinel lymph node biopsy as she does not appear to having lymph node  involvement right now. We discussed the performance of that with injection of Magtrace with possible skin discoloration. We discussed that there is a chance of having a positive node with a sentinel lymph node biopsy and we will await the permanent pathology to make any other first  further decisions in terms of her treatment. We discussed up to a 5% risk lifetime of chronic shoulder pain as well as lymphedema associated with a sentinel lymph node biopsy  We discussed the options for treatment of the breast cancer which included lumpectomy versus a mastectomy. We discussed the performance of the lumpectomy with radioactive seed placement. We discussed a 5-10% chance of a positive margin requiring reexcision in the operating room. We also discussed that she will likely need radiation therapy if she undergoes lumpectomy. We discussed mastectomy and the postoperative care for that as well. Mastectomy can be followed by reconstruction. The decision for lumpectomy vs mastectomy has no impact on decision for chemotherapy. Most mastectomy patients will not need radiation therapy. We discussed that there is no difference in her survival whether she undergoes lumpectomy with radiation therapy or antiestrogen therapy versus a mastectomy. There is also no real difference between her recurrence in the breast.  We discussed the risks of operation including bleeding, infection, possible reoperation. She understands her further therapy will be based on what her stages at the time of her operation.

## 2023-10-22 ENCOUNTER — Encounter (HOSPITAL_BASED_OUTPATIENT_CLINIC_OR_DEPARTMENT_OTHER)
Admission: RE | Disposition: A | Discharge status: Home / Self Care | Payer: Self-pay | Source: Home / Self Care | Attending: General Surgery

## 2023-10-22 ENCOUNTER — Ambulatory Visit (HOSPITAL_BASED_OUTPATIENT_CLINIC_OR_DEPARTMENT_OTHER): Payer: Self-pay | Admitting: Anesthesiology

## 2023-10-22 ENCOUNTER — Observation Stay (HOSPITAL_BASED_OUTPATIENT_CLINIC_OR_DEPARTMENT_OTHER)
Admission: RE | Admit: 2023-10-22 | Discharge: 2023-10-23 | Disposition: A | Discharge status: Home / Self Care | Attending: General Surgery | Admitting: General Surgery

## 2023-10-22 ENCOUNTER — Other Ambulatory Visit: Payer: Self-pay

## 2023-10-22 ENCOUNTER — Encounter (HOSPITAL_BASED_OUTPATIENT_CLINIC_OR_DEPARTMENT_OTHER): Payer: Self-pay | Admitting: General Surgery

## 2023-10-22 DIAGNOSIS — I10 Essential (primary) hypertension: Secondary | ICD-10-CM | POA: Diagnosis not present

## 2023-10-22 DIAGNOSIS — Z171 Estrogen receptor negative status [ER-]: Secondary | ICD-10-CM

## 2023-10-22 DIAGNOSIS — C50911 Malignant neoplasm of unspecified site of right female breast: Principal | ICD-10-CM | POA: Insufficient documentation

## 2023-10-22 DIAGNOSIS — Z1722 Progesterone receptor negative status: Secondary | ICD-10-CM | POA: Diagnosis not present

## 2023-10-22 DIAGNOSIS — Z9011 Acquired absence of right breast and nipple: Secondary | ICD-10-CM

## 2023-10-22 DIAGNOSIS — Z01818 Encounter for other preprocedural examination: Principal | ICD-10-CM

## 2023-10-22 DIAGNOSIS — G8918 Other acute postprocedural pain: Secondary | ICD-10-CM | POA: Diagnosis not present

## 2023-10-22 DIAGNOSIS — C50411 Malignant neoplasm of upper-outer quadrant of right female breast: Secondary | ICD-10-CM | POA: Diagnosis not present

## 2023-10-22 DIAGNOSIS — Z1732 Human epidermal growth factor receptor 2 negative status: Secondary | ICD-10-CM | POA: Diagnosis not present

## 2023-10-22 HISTORY — PX: TOTAL MASTECTOMY: SHX6129

## 2023-10-22 HISTORY — PX: AXILLARY SENTINEL NODE BIOPSY: SHX5738

## 2023-10-22 SURGERY — MASTECTOMY, SIMPLE
Anesthesia: Regional | Site: Breast | Laterality: Right

## 2023-10-22 MED ORDER — ONDANSETRON HCL 4 MG/2ML IJ SOLN
INTRAMUSCULAR | Status: AC
  Filled 2023-10-22: qty 2

## 2023-10-22 MED ORDER — FENTANYL CITRATE (PF) 100 MCG/2ML IJ SOLN
INTRAMUSCULAR | Status: DC | PRN
  Administered 2023-10-22 (×3): 50 ug via INTRAVENOUS

## 2023-10-22 MED ORDER — ACETAMINOPHEN 500 MG PO TABS
ORAL_TABLET | ORAL | Status: AC
  Filled 2023-10-22: qty 2

## 2023-10-22 MED ORDER — BUPIVACAINE LIPOSOME 1.3 % IJ SUSP
INTRAMUSCULAR | Status: DC | PRN
  Administered 2023-10-22: 10 mL via PERINEURAL

## 2023-10-22 MED ORDER — AMISULPRIDE (ANTIEMETIC) 5 MG/2ML IV SOLN: 10.0000 mg | Freq: Once | INTRAVENOUS | Status: DC | PRN

## 2023-10-22 MED ORDER — ONDANSETRON HCL 4 MG/2ML IJ SOLN: 4.0000 mg | Freq: Four times a day (QID) | INTRAMUSCULAR | Status: DC | PRN

## 2023-10-22 MED ORDER — MIDAZOLAM HCL 2 MG/2ML IJ SOLN
INTRAMUSCULAR | Status: AC
  Filled 2023-10-22: qty 2

## 2023-10-22 MED ORDER — PROPOFOL 10 MG/ML IV BOLUS
INTRAVENOUS | Status: AC
  Filled 2023-10-22: qty 20

## 2023-10-22 MED ORDER — TRANEXAMIC ACID 1000 MG/10ML IV SOLN
INTRAVENOUS | Status: AC
  Filled 2023-10-22: qty 30

## 2023-10-22 MED ORDER — CHLORHEXIDINE GLUCONATE CLOTH 2 % EX PADS: 6.0000 | MEDICATED_PAD | Freq: Once | CUTANEOUS | Status: DC

## 2023-10-22 MED ORDER — EPHEDRINE 5 MG/ML INJ
INTRAVENOUS | Status: AC
  Filled 2023-10-22: qty 5

## 2023-10-22 MED ORDER — TRANEXAMIC ACID 1000 MG/10ML IV SOLN
Status: DC | PRN
  Administered 2023-10-22: 3000 mg via TOPICAL

## 2023-10-22 MED ORDER — CEFAZOLIN SODIUM-DEXTROSE 2-4 GM/100ML-% IV SOLN
2.0000 g | INTRAVENOUS | Status: AC
  Administered 2023-10-22: 2 g via INTRAVENOUS

## 2023-10-22 MED ORDER — FENTANYL CITRATE (PF) 100 MCG/2ML IJ SOLN
100.0000 ug | Freq: Once | INTRAMUSCULAR | Status: AC
  Administered 2023-10-22: 100 ug via INTRAVENOUS

## 2023-10-22 MED ORDER — ACETAMINOPHEN 500 MG PO TABS
1000.0000 mg | ORAL_TABLET | Freq: Four times a day (QID) | ORAL | Status: DC
  Administered 2023-10-22 – 2023-10-23 (×3): 1000 mg via ORAL
  Filled 2023-10-22 (×3): qty 2

## 2023-10-22 MED ORDER — HYDROMORPHONE HCL 1 MG/ML IJ SOLN
INTRAMUSCULAR | Status: AC
  Filled 2023-10-22: qty 0.5

## 2023-10-22 MED ORDER — MIDAZOLAM HCL 2 MG/2ML IJ SOLN
2.0000 mg | Freq: Once | INTRAMUSCULAR | Status: AC
  Administered 2023-10-22: 2 mg via INTRAVENOUS

## 2023-10-22 MED ORDER — ACETAMINOPHEN 500 MG PO TABS
1000.0000 mg | ORAL_TABLET | ORAL | Status: AC
  Administered 2023-10-22: 1000 mg via ORAL

## 2023-10-22 MED ORDER — DEXAMETHASONE SODIUM PHOSPHATE 4 MG/ML IJ SOLN
INTRAMUSCULAR | Status: DC | PRN
  Administered 2023-10-22: 5 mg via INTRAVENOUS

## 2023-10-22 MED ORDER — LACTATED RINGERS IV SOLN: INTRAVENOUS | Status: DC

## 2023-10-22 MED ORDER — OXYCODONE HCL 5 MG PO TABS: 5.0000 mg | ORAL_TABLET | Freq: Once | ORAL | Status: DC | PRN

## 2023-10-22 MED ORDER — METHOCARBAMOL 500 MG PO TABS: 500.0000 mg | ORAL_TABLET | Freq: Three times a day (TID) | ORAL | Status: DC | PRN

## 2023-10-22 MED ORDER — LIDOCAINE HCL (CARDIAC) PF 100 MG/5ML IV SOSY
PREFILLED_SYRINGE | INTRAVENOUS | Status: DC | PRN
  Administered 2023-10-22: 40 mg via INTRAVENOUS

## 2023-10-22 MED ORDER — EPHEDRINE SULFATE (PRESSORS) 50 MG/ML IJ SOLN
INTRAMUSCULAR | Status: DC | PRN
  Administered 2023-10-22: 10 mg via INTRAVENOUS

## 2023-10-22 MED ORDER — HYDROMORPHONE HCL 1 MG/ML IJ SOLN
0.2500 mg | INTRAMUSCULAR | Status: DC | PRN
  Administered 2023-10-22 (×2): 0.5 mg via INTRAVENOUS

## 2023-10-22 MED ORDER — CEFAZOLIN SODIUM-DEXTROSE 2-4 GM/100ML-% IV SOLN
INTRAVENOUS | Status: AC
Start: 2023-10-22 — End: 2023-10-22
  Filled 2023-10-22: qty 100

## 2023-10-22 MED ORDER — MAGTRACE LYMPHATIC TRACER
INTRAMUSCULAR | Status: DC | PRN
  Administered 2023-10-22: 2 mL via INTRAMUSCULAR

## 2023-10-22 MED ORDER — SIMETHICONE 80 MG PO CHEW: 40.0000 mg | CHEWABLE_TABLET | Freq: Four times a day (QID) | ORAL | Status: DC | PRN

## 2023-10-22 MED ORDER — PROPOFOL 10 MG/ML IV BOLUS
INTRAVENOUS | Status: DC | PRN
  Administered 2023-10-22: 40 mg via INTRAVENOUS
  Administered 2023-10-22: 100 mg via INTRAVENOUS

## 2023-10-22 MED ORDER — TRAMADOL HCL 50 MG PO TABS: 100.0000 mg | ORAL_TABLET | Freq: Four times a day (QID) | ORAL | Status: DC | PRN

## 2023-10-22 MED ORDER — LIDOCAINE 2% (20 MG/ML) 5 ML SYRINGE
INTRAMUSCULAR | Status: AC
  Filled 2023-10-22: qty 5

## 2023-10-22 MED ORDER — GLYCOPYRROLATE PF 0.2 MG/ML IJ SOSY
PREFILLED_SYRINGE | INTRAMUSCULAR | Status: AC
  Filled 2023-10-22: qty 1

## 2023-10-22 MED ORDER — DEXAMETHASONE SODIUM PHOSPHATE 10 MG/ML IJ SOLN
INTRAMUSCULAR | Status: AC
  Filled 2023-10-22: qty 1

## 2023-10-22 MED ORDER — GLYCOPYRROLATE 0.2 MG/ML IJ SOLN
INTRAMUSCULAR | Status: DC | PRN
Start: 2023-10-22 — End: 2023-10-22
  Administered 2023-10-22: .2 mg via INTRAVENOUS

## 2023-10-22 MED ORDER — FENTANYL CITRATE (PF) 100 MCG/2ML IJ SOLN
INTRAMUSCULAR | Status: AC
  Filled 2023-10-22: qty 2

## 2023-10-22 MED ORDER — PHENYLEPHRINE HCL (PRESSORS) 10 MG/ML IV SOLN
INTRAVENOUS | Status: DC | PRN
Start: 2023-10-22 — End: 2023-10-22
  Administered 2023-10-22 (×2): 80 ug via INTRAVENOUS

## 2023-10-22 MED ORDER — PHENYLEPHRINE 80 MCG/ML (10ML) SYRINGE FOR IV PUSH (FOR BLOOD PRESSURE SUPPORT)
PREFILLED_SYRINGE | INTRAVENOUS | Status: AC
  Filled 2023-10-22: qty 10

## 2023-10-22 MED ORDER — ENSURE PRE-SURGERY PO LIQD: 296.0000 mL | Freq: Once | ORAL | Status: DC

## 2023-10-22 MED ORDER — METHOCARBAMOL 1000 MG/10ML IJ SOLN: 500.0000 mg | Freq: Three times a day (TID) | INTRAMUSCULAR | Status: DC | PRN

## 2023-10-22 MED ORDER — ROPIVACAINE HCL 5 MG/ML IJ SOLN
INTRAMUSCULAR | Status: DC | PRN
  Administered 2023-10-22: 15 mL via PERINEURAL

## 2023-10-22 MED ORDER — ONDANSETRON HCL 4 MG/2ML IJ SOLN: 4.0000 mg | Freq: Once | INTRAMUSCULAR | Status: DC | PRN

## 2023-10-22 MED ORDER — OXYCODONE HCL 5 MG/5ML PO SOLN: 5.0000 mg | Freq: Once | ORAL | Status: DC | PRN

## 2023-10-22 MED ORDER — ONDANSETRON HCL 4 MG/2ML IJ SOLN
INTRAMUSCULAR | Status: DC | PRN
  Administered 2023-10-22: 4 mg via INTRAVENOUS

## 2023-10-22 SURGICAL SUPPLY — 51 items
BIOPATCH RED 1 DISK 7.0 (GAUZE/BANDAGES/DRESSINGS) IMPLANT
BLADE SURG 10 STRL SS (BLADE) ×2 IMPLANT
BLADE SURG 15 STRL LF DISP TIS (BLADE) ×2 IMPLANT
CANISTER SUCT 1200ML W/VALVE (MISCELLANEOUS) ×2 IMPLANT
CHLORAPREP W/TINT 26 (MISCELLANEOUS) ×2 IMPLANT
CLIP APPLIE 11 MED OPEN (CLIP) IMPLANT
CLIP APPLIE 9.375 MED OPEN (MISCELLANEOUS) IMPLANT
COVER BACK TABLE 60X90IN (DRAPES) ×2 IMPLANT
COVER MAYO STAND STRL (DRAPES) ×2 IMPLANT
COVER PROBE CYLINDRICAL 5X96 (MISCELLANEOUS) ×2 IMPLANT
DERMABOND ADVANCED .7 DNX12 (GAUZE/BANDAGES/DRESSINGS) ×2 IMPLANT
DRAIN CHANNEL 19F RND (DRAIN) ×2 IMPLANT
DRAPE TOP ARMCOVERS (MISCELLANEOUS) ×2 IMPLANT
DRAPE U-SHAPE 76X120 STRL (DRAPES) ×2 IMPLANT
DRAPE UTILITY XL STRL (DRAPES) ×2 IMPLANT
DRSG TEGADERM 4X4.75 (GAUZE/BANDAGES/DRESSINGS) IMPLANT
ELECT COATED BLADE 2.86 ST (ELECTRODE) IMPLANT
ELECTRODE REM PT RTRN 9FT ADLT (ELECTROSURGICAL) ×2 IMPLANT
EVACUATOR SILICONE 100CC (DRAIN) ×2 IMPLANT
GAUZE PAD ABD 8X10 STRL (GAUZE/BANDAGES/DRESSINGS) ×2 IMPLANT
GAUZE SPONGE 4X4 12PLY STRL LF (GAUZE/BANDAGES/DRESSINGS) IMPLANT
GLOVE BIO SURGEON STRL SZ7 (GLOVE) ×2 IMPLANT
GLOVE BIOGEL PI IND STRL 7.5 (GLOVE) ×2 IMPLANT
GOWN STRL REUS W/ TWL LRG LVL3 (GOWN DISPOSABLE) ×6 IMPLANT
HEMOSTAT ARISTA ABSORB 3G PWDR (HEMOSTASIS) IMPLANT
LIGHT WAVEGUIDE WIDE FLAT (MISCELLANEOUS) ×2 IMPLANT
NDL HYPO 25X1 1.5 SAFETY (NEEDLE) IMPLANT
NDL SAFETY ECLIPSE 18X1.5 (NEEDLE) IMPLANT
NEEDLE HYPO 25X1 1.5 SAFETY (NEEDLE) ×2 IMPLANT
NS IRRIG 1000ML POUR BTL (IV SOLUTION) ×2 IMPLANT
PACK BASIN DAY SURGERY FS (CUSTOM PROCEDURE TRAY) ×2 IMPLANT
PENCIL SMOKE EVACUATOR (MISCELLANEOUS) ×2 IMPLANT
PIN SAFETY STERILE (MISCELLANEOUS) ×2 IMPLANT
RETRACTOR ONETRAX LX 90X20 (MISCELLANEOUS) IMPLANT
SLEEVE SCD COMPRESS KNEE MED (STOCKING) ×2 IMPLANT
SPIKE FLUID TRANSFER (MISCELLANEOUS) IMPLANT
SPONGE T-LAP 18X18 ~~LOC~~+RFID (SPONGE) ×2 IMPLANT
STAPLER SKIN PROX WIDE 3.9 (STAPLE) IMPLANT
STRIP CLOSURE SKIN 1/2X4 (GAUZE/BANDAGES/DRESSINGS) IMPLANT
SUT ETHILON 2 0 FS 18 (SUTURE) ×2 IMPLANT
SUT MNCRL AB 4-0 PS2 18 (SUTURE) IMPLANT
SUT SILK 2 0 SH (SUTURE) IMPLANT
SUT VIC AB 2-0 SH 27XBRD (SUTURE) ×4 IMPLANT
SUT VIC AB 3-0 54X BRD REEL (SUTURE) IMPLANT
SUT VIC AB 3-0 SH 27X BRD (SUTURE) IMPLANT
SUT VICRYL 3-0 CR8 SH (SUTURE) ×2 IMPLANT
SYR CONTROL 10ML LL (SYRINGE) IMPLANT
TOWEL GREEN STERILE FF (TOWEL DISPOSABLE) ×4 IMPLANT
TRACER MAGTRACE VIAL (MISCELLANEOUS) IMPLANT
TUBE CONNECTING 20X1/4 (TUBING) ×2 IMPLANT
YANKAUER SUCT BULB TIP NO VENT (SUCTIONS) ×2 IMPLANT

## 2023-10-22 NOTE — Anesthesia Postprocedure Evaluation (Signed)
 Anesthesia Post Note  Patient: Aundria Bitterman Sliva  Procedure(s) Performed: MASTECTOMY, SIMPLE (Right: Breast) BIOPSY, LYMPH NODE, SENTINEL, AXILLARY (Right: Axilla)     Patient location during evaluation: PACU Anesthesia Type: Regional and General Level of consciousness: awake and alert, oriented and patient cooperative Pain management: pain level controlled Vital Signs Assessment: post-procedure vital signs reviewed and stable Respiratory status: spontaneous breathing, nonlabored ventilation and respiratory function stable Cardiovascular status: blood pressure returned to baseline and stable Postop Assessment: no apparent nausea or vomiting Anesthetic complications: no   No notable events documented.  Last Vitals:  Vitals:   10/22/23 1128 10/22/23 1130  BP:  (!) 175/79  Pulse: 79 77  Resp: 13 14  Temp:    SpO2: 100% 100%    Last Pain:  Vitals:   10/22/23 1130  PainSc: Asleep                 Almarie CHRISTELLA Marchi

## 2023-10-22 NOTE — Progress Notes (Signed)
Assisted Dr. Salvadore Farber with right, pectoralis, ultrasound guided block. Side rails up, monitors on throughout procedure. See vital signs in flow sheet. Tolerated Procedure well.

## 2023-10-22 NOTE — Anesthesia Procedure Notes (Signed)
 Anesthesia Regional Block: Pectoralis block   Pre-Anesthetic Checklist: , timeout performed,  Correct Patient, Correct Site, Correct Laterality,  Correct Procedure, Correct Position, site marked,  Risks and benefits discussed,  Surgical consent,  Pre-op evaluation,  At surgeon's request and post-op pain management  Laterality: Right  Prep: Maximum Sterile Barrier Precautions used, chloraprep       Needles:  Injection technique: Single-shot  Needle Type: Echogenic Stimulator Needle     Needle Length: 9cm  Needle Gauge: 22     Additional Needles:   Procedures:,,,, ultrasound used (permanent image in chart),,    Narrative:  Start time: 10/22/2023 8:55 AM End time: 10/22/2023 9:00 AM Injection made incrementally with aspirations every 5 mL.  Performed by: Personally  Anesthesiologist: Merla Almarie HERO, DO  Additional Notes: Monitors applied. No increased pain on injection. No increased resistance to injection. Injection made in 5cc increments. Good needle visualization. Patient tolerated procedure well.

## 2023-10-22 NOTE — Op Note (Signed)
 Preoperative diagnosis: Clinical stage I right breast cancer Postoperative diagnosis: Same as above Procedure: 1.  Right mastectomy 2.  Injection of mag trace for sentinel lymph node identification 3.  Right deep axillary sentinel lymph node biopsy Surgeon: Dr. Adina Bury Anesthesia: General With a pectoral block Estimated blood loss: Less than 50 cc Drains: 19 French Blake drain Specimens: 1.  Right breast tissue marked short stitch superior, long stitch lateral 2.  Right deep axillary sentinel lymph nodes with highest count of 92 Complications: Sponge needle count was correct completion Disposition recovery stable condition  Indications: This is a 73 year old female who had a right upper outer quadrant mass noted on her own exam.  She had a mammogram and ultrasound showed a 1.1 cm right upper quadrant mass and a biopsy was a triple negative invasive lobular carcinoma with a low Ki-67.  She had no lymphadenopathy.  She had an MRI that look like she had more extensive disease as well.  We discussed all of her options and elected proceed with a mastectomy after obtaining MR biopsies.  Procedure: After informed consent was obtained she was taken to the operating room.  She had first received a pectoral block.  She was given antibiotics.  SCDs were placed.  She was placed under general anesthesia without complication.  She was prepped and draped in the standard sterile surgical fashion.  A surgical timeout was then performed.  I injected 2 cc of mag trace in the subareolar position and massaged this for 5 minutes  I made a elliptical incision encompassing the nipple and the areola.  The inferior limb was the inframammary fold.  I then created flaps of the parasternal area, clavicle as well as the latissimus laterally.  The breast and the fascia were then removed from the muscle.  These were passed off the table as a specimen.  I then obliterated the inframammary fold and went a couple  centimeters below it.  I then was able to identify using the probe at least 1 sentinel node that had activity as well as was discolored.  There appeared to be a couple other nodes associated with this.  These were removed and passed off table as sentinel node biopsy.  There is no background activity.  There were no other palpable or brown nodes.  I then obtained hemostasis.  I placed a 27 [place] Blake drain and secured this with a 2-0 nylon suture.  I placed a TXA soaked sponge and left this in the cavity for 5 minutes.  This was hemostatic upon completion.  I then closed this with multiple 3-0 Vicryl interrupted sutures.  The skin was closed with 4-0 Monocryl and glue and Steri-Strips were applied.  She tolerated this well was extubated and transferred to recovery stable.

## 2023-10-22 NOTE — Anesthesia Procedure Notes (Signed)
 Procedure Name: LMA Insertion Date/Time: 10/22/2023 9:15 AM  Performed by: Pam Macario BROCKS, CRNAPre-anesthesia Checklist: Patient identified, Emergency Drugs available, Suction available, Patient being monitored and Timeout performed Patient Re-evaluated:Patient Re-evaluated prior to induction Oxygen Delivery Method: Circle system utilized Preoxygenation: Pre-oxygenation with 100% oxygen Induction Type: IV induction Ventilation: Mask ventilation without difficulty LMA: LMA inserted LMA Size: 4.0 Number of attempts: 1 Airway Equipment and Method: Bite block Placement Confirmation: positive ETCO2, breath sounds checked- equal and bilateral and CO2 detector Tube secured with: Tape Dental Injury: Teeth and Oropharynx as per pre-operative assessment

## 2023-10-22 NOTE — Interval H&P Note (Signed)
 History and Physical Interval Note:  10/22/2023 8:40 AM  Alyssa Robinson  has presented today for surgery, with the diagnosis of RIGHT BREAST CANCER.  The various methods of treatment have been discussed with the patient and family. After consideration of risks, benefits and other options for treatment, the patient has consented to  Procedure(s) with comments: MASTECTOMY, SIMPLE (Right) BIOPSY, LYMPH NODE, SENTINEL, AXILLARY (Right) - RIGHT MASTECTOMY RIGHT AXILLARY SENTINEL NODE BIOPSY LMA PEC BLOCK as a surgical intervention.  The patient's history has been reviewed, patient examined, no change in status, stable for surgery.  I have reviewed the patient's chart and labs.  Questions were answered to the patient's satisfaction.     Donnice Bury

## 2023-10-22 NOTE — Transfer of Care (Signed)
 Immediate Anesthesia Transfer of Care Note  Patient: Alyssa Robinson  Procedure(s) Performed: MASTECTOMY, SIMPLE (Right: Breast) BIOPSY, LYMPH NODE, SENTINEL, AXILLARY (Right: Axilla)  Patient Location: PACU  Anesthesia Type:General  Level of Consciousness: awake and drowsy  Airway & Oxygen Therapy: Patient Spontanous Breathing  Post-op Assessment: Report given to RN  Post vital signs: Reviewed and stable  Last Vitals:  Vitals Value Taken Time  BP 164/77 10/22/23 10:47  Temp    Pulse 73 10/22/23 10:49  Resp 14 10/22/23 10:49  SpO2 100 % 10/22/23 10:49  Vitals shown include unfiled device data.  Last Pain:  Vitals:   10/22/23 0740  PainSc: 0-No pain      Patients Stated Pain Goal: 3 (10/22/23 0740)  Complications: No notable events documented.

## 2023-10-23 ENCOUNTER — Encounter (HOSPITAL_BASED_OUTPATIENT_CLINIC_OR_DEPARTMENT_OTHER): Payer: Self-pay | Admitting: General Surgery

## 2023-10-23 MED ORDER — METHOCARBAMOL 500 MG PO TABS: 500.0000 mg | ORAL_TABLET | Freq: Three times a day (TID) | ORAL | 0 refills | Status: DC | PRN

## 2023-10-23 MED ORDER — ACETAMINOPHEN 500 MG PO TABS
1000.0000 mg | ORAL_TABLET | Freq: Once | ORAL | Status: AC
  Administered 2023-10-23: 1000 mg via ORAL

## 2023-10-23 MED ORDER — TRAMADOL HCL 50 MG PO TABS: 100.0000 mg | ORAL_TABLET | Freq: Four times a day (QID) | ORAL | 0 refills | Status: DC | PRN

## 2023-10-23 NOTE — Discharge Instructions (Addendum)
 [facility], [name] [phone]  MASTECTOMY: POST OP INSTRUCTIONS Take 400 mg of ibuprofen every 8 hours or 650 mg tylenol  every 6 hours for next 72 hours then as needed. Use ice several times daily also.   A prescription for pain medication may be given to you upon discharge.  Take your pain medication as prescribed, if needed.  If narcotic pain medicine is not needed, then you may take acetaminophen  (Tylenol ), naprosyn (Alleve) or ibuprofen (Advil) as needed. Take your usually prescribed medications unless otherwise directed. If you need a refill on your pain medication, please contact your pharmacy.  They will contact our office to request authorization.  Prescriptions will not be filled after 5pm or on week-ends. You should follow a light diet the first 24 hours after surgery.  Resume your normal diet the day after surgery. Most patients will experience some swelling and bruising on the chest and underarm.  Ice packs will help.  Swelling and bruising can take several days to resolve. Wear the binder or Alyssa Alyssa Robinson for 72 hours day and night. After that please wear during the day.  It is common to experience some constipation if taking pain medication after surgery.  Increasing fluid intake and taking a stool softener (such as Colace) will usually help or prevent this problem from occurring.  A mild laxative (Milk of Magnesia or Miralax ) should be taken according to package instructions if there are no bowel movements after 48 hours. There is glue and steristrips on your incision. They will come off in the next few weeks.  You may take a shower 48 hours after surgery.  Any sutures will be removed at an office visit DRAINS:  If you have drains in place, it is important to keep a list of the amount of drainage produced each day in your drains.  Before leaving the hospital, you should be instructed on drain care.  Call our office if you have any questions about your drains. I will remove  your drains when they put out less than 30 cc or ml for 2 consecutive days. ACTIVITIES:  You may resume regular (light) daily activities beginning the next day--such as daily self-care, walking, climbing stairs--gradually increasing activities as tolerated.  You may have sexual intercourse when it is comfortable.  Refrain from any heavy lifting or straining until approved by your Alyssa Robinson. You may drive when you are no longer taking prescription pain medication, you can comfortably wear a seatbelt, and you can safely maneuver your car and apply brakes. RETURN TO WORK:  __________________________________________________________ Alyssa Alyssa Robinson in the office for a follow-up appointment approximately 3-5 days after your surgery.  Your Alyssa Robinson's nurse will typically make your follow-up appointment when she calls you with your pathology report.  Expect your pathology report 3-4business days after surgery. OTHER INSTRUCTIONS: ______________________________________________________________________________________________ ____________________________________________________________________________________________ WHEN TO CALL YOUR DR Alyssa Alyssa Robinson: Fever over 101.0 Nausea and/or vomiting Extreme swelling or bruising Continued bleeding from incision. Increased pain, redness, or drainage from the incision. The clinic staff is available to answer your questions during regular business hours.  Please don't hesitate to call and ask to speak to one of the nurses for clinical concerns.  If you have a medical emergency, go to the nearest emergency room or call 911.  A surgeon from [facility] is always on call at the hospital. [address], [place], [place]  [address] ? [address] [address], [place], [place]   [address] [phone] ? [phone] ?  FAX [phone] Web site: [email or web address]   About my Jackson-Pratt Bulb Drain  What is a Jackson-Pratt bulb? A  Jackson-Pratt is a soft, round device used to collect drainage. It is connected to a long, thin drainage catheter, which is held in place by one or two small stiches near your surgical incision site. When the bulb is squeezed, it forms a vacuum, forcing the drainage to empty into the bulb.  Emptying the Jackson-Pratt bulb- To empty the bulb: 1. Release the plug on the top of the bulb. 2. Pour the bulb's contents into a measuring container which your nurse will provide. 3. Record the time emptied and amount of drainage. Empty the drain(s) as often as your     Alyssa Robinson or nurse recommends.  Date                  Time                    Amount (Drain 1)                  _____________________________________________________________________  _____________________________________________________________________  _____________________________________________________________________  _____________________________________________________________________  _____________________________________________________________________  _____________________________________________________________________  _____________________________________________________________________  _____________________________________________________________________  Squeezing the Jackson-Pratt Bulb- To squeeze the bulb: 1. Make sure the plug at the top of the bulb is open. 2. Squeeze the bulb tightly in your fist. You will hear air squeezing from the bulb. 3. Replace the plug while the bulb is squeezed. 4. Use a safety pin to attach the bulb to your clothing. This will keep the catheter from     pulling at the bulb insertion site.  When to call your Alyssa Robinson- Call your Alyssa Robinson if: Drain site becomes red, swollen or hot. You have a fever greater than 101 degrees F. There is oozing at the drain site. Drain falls out (apply a guaze bandage over the drain hole and secure it with tape). Drainage increases daily not related to  activity patterns. (You will usually have more drainage when you are active than when you are resting.) Drainage has a bad odor.

## 2023-10-23 NOTE — Discharge Summary (Signed)
 Physician Discharge Summary  Patient ID: Alyssa Robinson MRN: [id number] DOB/AGE: 08/11/1950 73 y.o.  Admit date: 10/22/2023 Discharge date: 10/23/2023  Admission Diagnoses: Breast cancer  Discharge Diagnoses:  Principal Problem:   S/P mastectomy, right   Discharged Condition: good  Hospital Course: 64 yof s/p right mastectomy/sn biopsy. Doing well following morning ready for discharge  Consults: None  Significant Diagnostic Studies: none  Treatments: surgery: right mastectomy/sn biopsy  Discharge Exam: Blood pressure (!) 112/54, pulse 60, temperature 97.6 F (36.4 C), resp. rate 16, height 4' 11 (1.499 m), weight 45.6 kg, SpO2 100%. No hematoma, drain as expected   Disposition: Discharge disposition: 01-Home or Self Care       Discharge Instructions     Discharge patient   Complete by: As directed    Discharge disposition: 01-Home or Self Care   Discharge patient date: 10/23/2023      Allergies as of 10/23/2023       Reactions   Caffeine Nausea And Vomiting, Palpitations   Makes heart race   Fluad Quadrivalent [influenza Vac A&b Sa Adj Quad] Nausea And Vomiting   Pt allergic to the 65 and over version of the flu vaccine, tolerates regular flu vaccine    Sodium Lauryl Sulfate Rash   Swollen gums        Medication List     STOP taking these medications    azithromycin  250 MG tablet Commonly known as: ZITHROMAX        TAKE these medications    loratadine 10 MG tablet Commonly known as: CLARITIN Take 10 mg by mouth daily.   methocarbamol  500 MG tablet Commonly known as: ROBAXIN  Take 1 tablet (500 mg total) by mouth every 8 (eight) hours as needed for muscle spasms.   polyethylene glycol powder 17 GM/SCOOP powder Commonly known as: GLYCOLAX /MIRALAX  Take by mouth once. Daily   sodium chloride  0.65 % Soln nasal spray Commonly known as: OCEAN Place 2-3 sprays into both nostrils 2 (two) times daily.   traMADol  50 MG tablet Commonly known as:  ULTRAM  Take 2 tablets (100 mg total) by mouth every 6 (six) hours as needed (mild pain).   ULTRAFLORA IMMUNE HEALTH PO Take 1 capsule by mouth daily.        Follow-up Information     Ebbie Cough, MD Follow up in 2 week(s).   Specialty: General Surgery Contact information: [address] [place] [address] [phone]                 Signed: Cough Ebbie 10/23/2023, 9:33 AM

## 2023-10-24 LAB — SURGICAL PATHOLOGY

## 2023-10-30 ENCOUNTER — Encounter: Payer: Self-pay | Admitting: *Deleted

## 2023-11-08 ENCOUNTER — Inpatient Hospital Stay: Attending: Hematology and Oncology | Admitting: Hematology and Oncology

## 2023-11-08 ENCOUNTER — Ambulatory Visit: Admitting: Hematology and Oncology

## 2023-11-08 VITALS — BP 137/62 | HR 65 | Temp 98.3°F | Resp 17 | Wt 105.0 lb

## 2023-11-08 DIAGNOSIS — C50411 Malignant neoplasm of upper-outer quadrant of right female breast: Secondary | ICD-10-CM | POA: Diagnosis not present

## 2023-11-08 DIAGNOSIS — Z1722 Progesterone receptor negative status: Secondary | ICD-10-CM | POA: Insufficient documentation

## 2023-11-08 DIAGNOSIS — Z1732 Human epidermal growth factor receptor 2 negative status: Secondary | ICD-10-CM | POA: Insufficient documentation

## 2023-11-08 DIAGNOSIS — Z171 Estrogen receptor negative status [ER-]: Secondary | ICD-10-CM | POA: Diagnosis not present

## 2023-11-08 NOTE — Progress Notes (Signed)
 Patient Care Team: Rollene Almarie LABOR, MD as PCP - General (Internal Medicine) Glean Stephane BROCKS, RN (Inactive) as Oncology Nurse Navigator Tyree Nanetta SAILOR, RN as Oncology Nurse Navigator Odean Potts, MD as Consulting Physician (Hematology and Oncology)  DIAGNOSIS:  Encounter Diagnosis  Name Primary?   Malignant neoplasm of upper-outer quadrant of right breast in female, estrogen receptor negative (HCC) Yes    SUMMARY OF ONCOLOGIC HISTORY: Oncology History  Malignant neoplasm of upper-outer quadrant of right breast in female, estrogen receptor negative (HCC)  09/12/2023 Initial Diagnosis   Palpable lump right breast with dimpling of the overlying skin for 2 months, mammogram and ultrasound: 1.1 cm asymmetry/mass UOQ right breast, axilla negative: Biopsy: Grade 2 invasive lobular cancer ER 0%, PR 0%, HER2 2+ by IHC, FISH negative ratio 1    09/24/2023 Cancer Staging   Staging form: Breast, AJCC 8th Edition - Clinical: Stage IB (cT1c, cN0, cM0, G2, ER-, PR-, HER2-) - Signed by Odean Potts, MD on 09/24/2023 Histologic grading system: 3 grade system     CHIEF COMPLIANT: Low up after mastectomy to discuss results  HISTORY OF PRESENT ILLNESS:   History of Present Illness Alyssa Robinson is a 73 year old female with breast cancer who presents for post-surgical follow-up.  She underwent surgery for breast cancer approximately two weeks ago. Since then, she has experienced gastrointestinal issues, including alternating diarrhea and constipation. Urelle has improved her symptoms over the last two days, and she takes Miralax  daily to manage constipation. Pelvic floor exercises are performed every morning and night.  Her current diet consists of baby food, sweet potatoes, baked potatoes, cottage cheese, and almond butter. She drinks a Boost supplement daily and takes Flintstones immunity support vitamins.  She is physically active, walking between four to seven thousand steps a day, but  sometimes needs to rest due to fatigue. Her blood pressure is occasionally low, with a recent reading of 101/60, though it typically ranges from 135-145/70-80.     ALLERGIES:  is allergic to caffeine, fluad quadrivalent [influenza vac a&b sa adj quad], and sodium lauryl sulfate.  MEDICATIONS:  Current Outpatient Medications  Medication Sig Dispense Refill   loratadine (CLARITIN) 10 MG tablet Take 10 mg by mouth daily.     methocarbamol  (ROBAXIN ) 500 MG tablet Take 1 tablet (500 mg total) by mouth every 8 (eight) hours as needed for muscle spasms. 30 tablet 0   polyethylene glycol powder (GLYCOLAX /MIRALAX ) 17 GM/SCOOP powder Take by mouth once. Daily     Probiotic Product (ULTRAFLORA IMMUNE HEALTH PO) Take 1 capsule by mouth daily.     sodium chloride  (OCEAN) 0.65 % SOLN nasal spray Place 2-3 sprays into both nostrils 2 (two) times daily.     traMADol  (ULTRAM ) 50 MG tablet Take 2 tablets (100 mg total) by mouth every 6 (six) hours as needed (mild pain). 10 tablet 0   No current facility-administered medications for this visit.    PHYSICAL EXAMINATION: ECOG PERFORMANCE STATUS: 1 - Symptomatic but completely ambulatory  Vitals:   11/08/23 1512  BP: 137/62  Pulse: 65  Resp: 17  Temp: 98.3 F (36.8 C)  SpO2: 100%   Filed Weights   11/08/23 1512  Weight: 105 lb (47.6 kg)    Physical Exam VITALS: BP- 136/62  (exam performed in the presence of a chaperone)  LABORATORY DATA:  I have reviewed the data as listed    Latest Ref Rng & Units 06/06/2023    3:38 AM 06/05/2023    3:28  AM 06/04/2023    4:38 AM  CMP  Glucose 70 - 99 mg/dL 90  97  881   BUN 8 - 23 mg/dL 6  8  13    Creatinine 0.44 - 1.00 mg/dL 9.52  9.57  9.49   Sodium 135 - 145 mmol/L 134  138  143   Potassium 3.5 - 5.1 mmol/L 3.7  3.0  3.7   Chloride 98 - 111 mmol/L 104  109  113   CO2 22 - 32 mmol/L 22  22  24    Calcium  8.9 - 10.3 mg/dL 8.2  8.2  8.7   Total Protein 6.5 - 8.1 g/dL   5.9   Total Bilirubin 0.0 - 1.2  mg/dL   0.7   Alkaline Phos 38 - 126 U/L   59   AST 15 - 41 U/L   19   ALT 0 - 44 U/L   13     Lab Results  Component Value Date   WBC 5.1 06/06/2023   HGB 12.6 06/06/2023   HCT 39.6 06/06/2023   MCV 99.7 06/06/2023   PLT 96 (L) 06/06/2023   NEUTROABS 2.6 06/04/2023    ASSESSMENT & PLAN:  Malignant neoplasm of upper-outer quadrant of right breast in female, estrogen receptor negative (HCC) 09/12/2023: Palpable lump right breast with dimpling of the overlying skin for 2 months, mammogram and ultrasound: 1.1 cm asymmetry/mass UOQ right breast, axilla negative: Biopsy: Grade 2 invasive lobular cancer ER 0%, PR 0%, HER2 2+ by IHC, FISH negative ratio 1   Recommendation: 10/22/2023: Right mastectomy: Grade 2 ILC 1.8 cm with LCIS, margins negative, 0/6 lymph nodes negative, ER 0%, PR 0%, HER2 negative, Ki-67 10% Adjuvant chemotherapy with Taxotere and Cytoxan every 3 weeks x 4 (patient declined)  I recommended guardant reveal for MRD testing  She is an avid golfer and volunteers of the hospice care. Her husband is a Lawyer and [employer] day [employer].  Follow-up in 3 months for survivorship care plan visit ------------------------------------- Assessment and Plan Assessment & Plan Invasive lobular carcinoma of breast, stage 1B Post-surgical follow-up showed no residual cancer with negative surgical margins and lymph nodes. Cancer is triple-negative, increasing recurrence risk. She declined chemotherapy due to quality of life concerns. Recurrence risk is 20% over ten years without chemotherapy. - Initiate blood tests every six months to monitor for recurrence. - Schedule annual diagnostic mammograms and ultrasounds for the contralateral breast. - Arrange consultation with Morna for lifestyle medicine and survivorship planning.  Post-surgical bowel issues Alternating diarrhea and constipation managed with Miralax , dietary modifications, and pelvic floor exercises. - Continue  Miralax  daily. - Maintain dietary modifications, including soft foods. - Continue pelvic floor exercises.      No orders of the defined types were placed in this encounter.  The patient has a good understanding of the overall plan. she agrees with it. she will call with any problems that may develop before the next visit here. Total time spent: 30 mins including face to face time and time spent for planning, charting and co-ordination of care   Viinay K Clint Biello, MD 11/08/23

## 2023-11-08 NOTE — Assessment & Plan Note (Signed)
 09/12/2023: Palpable lump right breast with dimpling of the overlying skin for 2 months, mammogram and ultrasound: 1.1 cm asymmetry/mass UOQ right breast, axilla negative: Biopsy: Grade 2 invasive lobular cancer ER 0%, PR 0%, HER2 2+ by IHC, FISH negative ratio 1   Recommendation: 10/22/2023: Right mastectomy: Grade 2 ILC 1.8 cm with LCIS, margins negative, 0/6 lymph nodes negative, ER 0%, PR 0%, HER2 negative, Ki-67 10% Adjuvant chemotherapy with Taxotere and Cytoxan every 3 weeks x 4 Adjuvant radiation therapy Genetic testing has been performed  She is an avid golfer and volunteers of the hospice care. Her husband is a Lawyer and [employer].

## 2023-11-09 ENCOUNTER — Encounter: Payer: Self-pay | Admitting: *Deleted

## 2023-11-09 ENCOUNTER — Telehealth: Payer: Self-pay

## 2023-11-09 DIAGNOSIS — C50411 Malignant neoplasm of upper-outer quadrant of right female breast: Secondary | ICD-10-CM

## 2023-11-09 NOTE — Telephone Encounter (Signed)
 Per md orders entered for [employer] and all supported documents faxed to [phone]. Faxed confirmation was received.

## 2023-11-12 ENCOUNTER — Ambulatory Visit: Payer: Self-pay | Admitting: Rehabilitation

## 2023-11-18 NOTE — Therapy (Signed)
 OUTPATIENT PHYSICAL THERAPY BREAST CANCER POST OP FOLLOW UP   Patient Name: Alyssa Robinson MRN: [id number] DOB:1951/01/17, 73 y.o., female Today's Date: 11/19/2023  END OF SESSION:  PT End of Session - 11/19/23 1102     Visit Number 2    Number of Visits 6    Date for PT Re-Evaluation 12/31/23    PT Start Time 1103    PT Stop Time 1203    PT Time Calculation (min) 60 min    Activity Tolerance Patient tolerated treatment well    Behavior During Therapy WFL for tasks assessed/performed          Past Medical History:  Diagnosis Date   Allergy 20-25 years ago   Anemia    hx   Arthritis 8-10 years ago   Blood transfusion without reported diagnosis 20 years ago   Bronchitis    Bronchitis    hx   Closed patellar sleeve fracture of left knee    Diverticulosis    GERD (gastroesophageal reflux disease)    occ   Headache(784.0)    migraines occ   Hypertension    no meds in over 63yrs   Pneumonia    Seasonal allergies    Past Surgical History:  Procedure Laterality Date   ABDOMINAL HYSTERECTOMY     73 y/o   APPENDECTOMY  03/17/2013   Procedure: INCIDENTAL APPENDECTOMY;  Surgeon: Lynwood MALVA Pina, MD;  Location: [facility];  Service: General;;   AXILLARY SENTINEL NODE BIOPSY Right 10/22/2023   Procedure: BIOPSY, LYMPH NODE, SENTINEL, AXILLARY;  Surgeon: Ebbie Cough, MD;  Location: [facility];  Service: General;  Laterality: Right;  RIGHT MASTECTOMY RIGHT AXILLARY SENTINEL NODE BIOPSY LMA PEC BLOCK   BREAST BIOPSY Right 09/12/2023   US  RT BREAST BX W LOC DEV 1ST LESION IMG BX SPEC US  GUIDE 09/12/2023 [facility]   capsule endoscopy     COLON SURGERY     COLONOSCOPY     in 2012 -    COLOSTOMY CLOSURE  03/17/2013   COLOSTOMY REVERSAL  03/17/2013   Procedure: COLOSTOMY REVERSAL;  Surgeon: Lynwood MALVA Pina, MD;  Location: [facility];  Service: General;;   EUS N/A 04/30/2015   Procedure: UPPER ENDOSCOPIC ULTRASOUND (EUS) LINEAR;  Surgeon: Belvie Just, MD;   Location: [facility];  Service: Endoscopy;  Laterality: N/A;   HERNIA REPAIR     X2 2-4 y/o inguinal   JOINT REPLACEMENT  ORID Knee/right hip   2021/2022   LAPAROTOMY N/A 10/17/2012   Procedure: EXPLORATORY LAPAROTOMY,  COLON RESECTION AND COLOSTOMY;  Surgeon: Lynwood MALVA Pina, MD;  Location: [facility];  Service: General;  Laterality: N/A;   LIPOMA EXCISION Left    left arm   LYSIS OF ADHESION  03/17/2013   Procedure: LYSIS OF ADHESION;  Surgeon: Lynwood MALVA Pina, MD;  Location: [facility];  Service: General;;   ORIF PATELLA Left 02/20/2020   Procedure: OPEN REDUCTION INTERNAL (ORIF) FIXATION LEFT PATELLA;  Surgeon: Jerri Kay HERO, MD;  Location: [facility];  Service: Orthopedics;  Laterality: Left;   SMALL INTESTINE SURGERY  Perforated colon   2014   TOTAL HIP ARTHROPLASTY Right 09/12/2021   Procedure: RIGHT TOTAL HIP ARTHROPLASTY ANTERIOR APPROACH;  Surgeon: Jerri Kay HERO, MD;  Location: [facility];  Service: Orthopedics;  Laterality: Right;  3-C   TOTAL HIP ARTHROPLASTY Left 02/23/2023   Procedure: LEFT TOTAL HIP ARTHROPLASTY ANTERIOR APPROACH;  Surgeon: Jerri Kay HERO, MD;  Location: [facility];  Service: Orthopedics;  Laterality: Left;  3-C   TOTAL MASTECTOMY Right 10/22/2023   Procedure: MASTECTOMY, SIMPLE;  Surgeon: Ebbie Cough, MD;  Location: [facility];  Service: General;  Laterality: Right;   UPPER GASTROINTESTINAL ENDOSCOPY     2013 -   Patient Active Problem List   Diagnosis Date Noted   S/P mastectomy, right 10/22/2023   Left otitis media 10/20/2023   Vertigo 10/20/2023   Malignant neoplasm of upper-outer quadrant of right breast in female, estrogen receptor negative (HCC) 09/21/2023   Mass of right breast 09/08/2023   Mild protein-calorie malnutrition (HCC) 06/05/2023   Status post total replacement of left hip 02/23/2023   History of colonic polyps 02/03/2022   Family history of early CAD 07/02/2020   Lipoma of abdominal wall 08/04/2019   Osteopenia  01/28/2019   Thrombocytopenia (HCC) 10/17/2017   Vaginal atrophy 06/01/2015   Cyst of pancreas 03/06/2015   Glaucoma suspect of both eyes 03/11/2014   Nuclear sclerosis of both eyes 03/11/2014   Routine general medical examination at a health care facility 12/11/2012   Hyperglycemia 12/11/2012   Essential hypertension, benign 12/11/2012    PCP:   REFERRING PROVIDER: Ebbie Cough, MD   REFERRING DIAG:  Right Breast Cancer   THERAPY DIAG:  Malignant neoplasm of upper-outer quadrant of right breast in female, estrogen receptor negative (HCC)  Abnormal posture  Rationale for Evaluation and Treatment: Rehabilitation  ONSET DATE: 09/12/2023  SUBJECTIVE:                                                                                                                                                                                           SUBJECTIVE STATEMENT: I feel like ROM is doing really well. Very  little tightness more than pain. Doing most activities at home now except nothing heavy. Its a little harder to reach behind my back but not bad.  PERTINENT HISTORY:  Patient was diagnosed on 09/12/2023 with right grade 2 Invasive Lobular Carcinoma. It measures 1.1 cm and is located in the Upper-Outer quadrant. It is Triple Negative with a Ki67 of 10%. Pt had a recent breast MRI. Scheduled initially for Right lumpectomy with SLNB on 10/22/23, however, after MRI results decided to have a right Mastectomy with SLNB on 10/22/2023 and had 0+/6 LN's. She has decided not to do chemotherapy . He is putting me in a study with [employer]...  PATIENT GOALS:  Reassess how my recovery is going related to arm function, pain, and swelling.  PAIN:  Are you having pain? No, just tender  PRECAUTIONS: Recent Surgery, right UE Lymphedema risk, Bilateral THA, Left patellar ORIF   RED  FLAGS: None   ACTIVITY LEVEL / LEISURE: anxious to resume light wts and golf   OBJECTIVE:   PATIENT  SURVEYS:  QUICK DASH: 9.09 but left 1 blank  OBSERVATIONS: Mastectomy incision healing but with multiple small scabs and not yet ready for scar massage. Right axilla upper arm and antecubital fossa with several cords causing tighness but minimal ROM restrictions and not painful  POSTURE:  Forward head, rounded shoulders  LYMPHEDEMA ASSESSMENT:   UPPER EXTREMITY AROM/PROM:   A/PROM RIGHT   eval   RIGHT 11/19/2023  Shoulder extension 64 65  Shoulder flexion 155 151  Shoulder abduction 178 170  Shoulder internal rotation 64 70  Shoulder external rotation 114 114                          (Blank rows = not tested)   A/PROM LEFT   eval  Shoulder extension 64  Shoulder flexion 160  Shoulder abduction 180  Shoulder internal rotation 72  Shoulder external rotation 106                          (Blank rows = not tested)   CERVICAL AROM: All within functional limits:          UPPER EXTREMITY STRENGTH: WNL   LYMPHEDEMA ASSESSMENTS (in cm):    LANDMARK RIGHT   eval RIGHT 11/19/2023  10 cm proximal to olecranon process 22.4 22.2  Olecranon process 20.2 20.8  10 cm proximal to ulnar styloid process 18.6 18.0  Just proximal to ulnar styloid process 13.9 13.8  Across hand at thumb web space 18.9 18.7  At base of 2nd digit 5.6 5.6  (Blank rows = not tested)   LANDMARK LEFT   eval  10 cm proximal to olecranon process 22.4  Olecranon process 21.2  10 cm proximal to ulnar styloid process 18.5  Just proximal to ulnar styloid process 14.1  Across hand at thumb web space 18.5  At base of 2nd digit 5.7  (Blank rows = not tested)     Surgery type/Date: 10/22/2023 right Mastectomy with SLNB Number of lymph nodes removed: 0+/6 Current/past treatment (chemo, radiation, hormone therapy): declined Chemo Other symptoms:  Heaviness/tightness Yes, tightness Pain No Pitting edema No Infections No Decreased scar mobility Yes Stemmer sign No  PATIENT EDUCATION:  Access Code:  [email or web address] URL: [email or web address] Date: 11/19/2023 Prepared by: Grayce Sheldon  Exercises - Standing 'L' Stretch at Counter  - 2 x daily - 7 x weekly - 1 sets - 3 reps - 20 hold - Supine Shoulder Flexion with Dowel  - 2 x daily - 7 x weekly - 1 sets - 5 reps - 5 hold - Single Arm Doorway Pec Stretch at 90 Degrees Abduction  - 1-2 x daily - 7 x weekly - 1 sets - 3 reps - 20 hold Education details: ABC video, SOZO screen set up, Cording, HEP update,avoid wts, golf presently until follow up, Scar massage only when scabs are off. Person educated: Patient Education method: Explanation Education comprehension: verbalized understanding and returned demonstration  HOME EXERCISE PROGRAM: Reviewed previously given post op HEP. Added supine wand flex and scaption, pec doorway stretch, standing lat stretch  ASSESSMENT:  CLINICAL IMPRESSION: Pt is s/p a Right Mastectomy with SLNB on 10/22/2023. She has decided against chemotherapy. She is making good progress, but incision not yet ready for scar massage, Cording is present and mildly tight with only mild ROM  restriction and no significant pain. Pt was educated in gentle cording stretches and pectoral stretches, in addition to lat stretch and supine wand for flex and scaption. She will work on gentle stretching and we will follow up in 2 weeks. At that time if doing well she may be able to resume her light wts or TB. She will watch the ABC video so we may discuss next time. She may do a few putts but no swings yet until we know it won't flare up her cording. There is no sign of swelling. She will benefit from a follow up visit and possibly a few more as needed to address cording, ROM, strength and scar massage when ready. .  Pt will benefit from skilled therapeutic intervention to improve on the following deficits: Decreased knowledge of precautions, impaired UE functional use, pain, decreased ROM, postural dysfunction.   PT  treatment/interventions: ADL/Self care home management, [phone]- PT Re-evaluation, 97110-Therapeutic exercises, 97530- Therapeutic activity, [id number]- Neuromuscular re-education, 97535- Self Care, and 02859- Manual therapy   GOALS: Goals reviewed with patient? Yes  LONG TERM GOALS:  (STG=LTG)  GOALS Name Target Date  Goal status  1 Pt will demonstrate she has regained full shoulder ROM and function post operatively compared to baselines.  Baseline: 12/31/2023 INITIAL  2 Pt will be able to resume light wts;1-2 lbs with difficulty or flare of cording 12/31/2023 INITIAL  3 Pt will be able to tolerate putting, short chips x 5-10 ea without flareups 12/31/2023 INITIAL     PLAN:  PT FREQUENCY/DURATION: 4 visits in 6 weeks prn  PLAN FOR NEXT SESSION: ready for scar massage?check cording, ROM, add 1# wts or band if ready but limited reps, let pt start putting, chips when ready, discuss AABC video.   [facility]  [address], [address]  [place] [address]  [phone]  After Breast Cancer Class Video It is recommended you view the ABC class video to be educated on lymphedema risk reduction. This video lasts for about 30 minutes. It can be viewed on our website here: [email or web address]  Scar massage You can begin gentle scar massage to you incision sites. Gently place one hand on the incision and move the skin (without sliding on the skin) in various directions. Do this for a few minutes and then you can gently massage either coconut oil or vitamin E cream into the scars.  Compression garment You should continue wearing your compression bra until you feel like you no longer have swelling.  Home exercise Program Continue doing the exercises you were given until you feel like you can do them without feeling any tightness at the end.   Walking Program Studies show that 30 minutes of walking per day (fast  enough to elevate your heart rate) can significantly reduce the risk of a cancer recurrence. If you can't walk due to other medical reasons, we encourage you to find another activity you could do (like a stationary bike or water exercise).  Posture After breast cancer surgery, people frequently sit with rounded shoulders posture because it puts their incisions on slack and feels better. If you sit like this and scar tissue forms in that position, you can become very tight and have pain sitting or standing with good posture. Try to be aware of your posture and sit and stand up tall to heal properly.  Follow up PT: It is recommended you return every 3 months for the first 3 years following surgery to be assessed on  the SOZO machine for an L-Dex score. This helps prevent clinically significant lymphedema in 95% of patients. These follow up screens are 10 minute appointments that you are not billed for.  Grayce JINNY Sheldon, PT 11/19/2023, 1:13 PM

## 2023-11-19 ENCOUNTER — Ambulatory Visit: Payer: Self-pay | Attending: General Surgery

## 2023-11-19 DIAGNOSIS — Z171 Estrogen receptor negative status [ER-]: Secondary | ICD-10-CM | POA: Diagnosis not present

## 2023-11-19 DIAGNOSIS — M25611 Stiffness of right shoulder, not elsewhere classified: Secondary | ICD-10-CM | POA: Insufficient documentation

## 2023-11-19 DIAGNOSIS — R293 Abnormal posture: Secondary | ICD-10-CM | POA: Diagnosis not present

## 2023-11-19 DIAGNOSIS — C50411 Malignant neoplasm of upper-outer quadrant of right female breast: Secondary | ICD-10-CM | POA: Insufficient documentation

## 2023-11-19 NOTE — Patient Instructions (Addendum)
 [facility]  [address]  [place] [address]  [phone]  After Breast Cancer Class Video It is recommended you view the ABC class video to be educated on lymphedema risk reduction. This video lasts for about 30 minutes. It can be viewed on our website here: [email or web address]  Scar massage You can begin gentle scar massage to you incision sites. Gently place one hand on the incision and move the skin (without sliding on the skin) in various directions. Do this for a few minutes and then you can gently massage either coconut oil or vitamin E cream into the scars.  Compression garment You should continue wearing your compression bra until you feel like you no longer have swelling.  Home exercise Program Continue doing the exercises you were given until you feel like you can do them without feeling any tightness at the end.   Walking Program Studies show that 30 minutes of walking per day (fast enough to elevate your heart rate) can significantly reduce the risk of a cancer recurrence. If you can't walk due to other medical reasons, we encourage you to find another activity you could do (like a stationary bike or water exercise).  Posture After breast cancer surgery, people frequently sit with rounded shoulders posture because it puts their incisions on slack and feels better. If you sit like this and scar tissue forms in that position, you can become very tight and have pain sitting or standing with good posture. Try to be aware of your posture and sit and stand up tall to heal properly.  Follow up PT: It is recommended you return every 3 months for the first 3 years following surgery to be assessed on the SOZO machine for an L-Dex score. This helps prevent clinically significant lymphedema in 95% of patients. These follow up screens are 10 minute appointments that you are not billed  for.   Axillary web syndrome (also called cording) can happen after having breast cancer surgery when lymph nodes in the armpit are removed. It presents as if you have a thin cord in your arm and can run from the armpit all the way down into the forearm. If you've had a sentinel node biopsy, the risk is 1-20% and if you've had an axillary lymph node dissection (more than 7 nodes removed), the risk is 36-72%. The ranges vary depending on the research study.  It most often happens 3-4 weeks post-op but can happen sooner or later. There are several possibilities for what cording actually is. Although no one knows for sure as of yet, it may be related to lymphatics, veins, or other tissue. Sometimes cording resolves on its own but other times it requires physical therapy with a therapist who specializes in lymphedema and/or cancer rehab. Treatment typically involves stretching, manual techniques, and exercise. Sometimes cords get "released" while stretching or during manual treatment and the patient may experience the sensation of a "pop." This may feel strange but it is not dangerous and is a sign that the cord has released; range of motion may be improved in the process.

## 2023-11-30 ENCOUNTER — Telehealth: Payer: Self-pay | Admitting: *Deleted

## 2023-11-30 NOTE — Telephone Encounter (Signed)
 Per MD request RN placed call to pt with recent [employer] results being negative.  Pt educated and verbalized understanding.

## 2023-12-03 ENCOUNTER — Ambulatory Visit: Payer: Self-pay | Attending: General Surgery

## 2023-12-03 DIAGNOSIS — Z171 Estrogen receptor negative status [ER-]: Secondary | ICD-10-CM | POA: Diagnosis not present

## 2023-12-03 DIAGNOSIS — C50411 Malignant neoplasm of upper-outer quadrant of right female breast: Secondary | ICD-10-CM | POA: Diagnosis not present

## 2023-12-03 DIAGNOSIS — R293 Abnormal posture: Secondary | ICD-10-CM | POA: Diagnosis not present

## 2023-12-03 DIAGNOSIS — M25611 Stiffness of right shoulder, not elsewhere classified: Secondary | ICD-10-CM | POA: Diagnosis not present

## 2023-12-03 NOTE — Therapy (Signed)
 OUTPATIENT PHYSICAL THERAPY BREAST CANCER POST OP TREATMENT   Patient Name: Alyssa Robinson MRN: [id number] DOB:1951-03-02, 73 y.o., female Today's Date: 12/03/2023  END OF SESSION:  PT End of Session - 12/03/23 1458     Visit Number 3    Number of Visits 6    Date for PT Re-Evaluation 12/31/23    PT Start Time 1500    PT Stop Time 1555    PT Time Calculation (min) 55 min    Activity Tolerance Patient tolerated treatment well    Behavior During Therapy WFL for tasks assessed/performed          Past Medical History:  Diagnosis Date   Allergy 20-25 years ago   Anemia    hx   Arthritis 8-10 years ago   Blood transfusion without reported diagnosis 20 years ago   Bronchitis    Bronchitis    hx   Closed patellar sleeve fracture of left knee    Diverticulosis    GERD (gastroesophageal reflux disease)    occ   Headache(784.0)    migraines occ   Hypertension    no meds in over 2yrs   Pneumonia    Seasonal allergies    Past Surgical History:  Procedure Laterality Date   ABDOMINAL HYSTERECTOMY     73 y/o   APPENDECTOMY  03/17/2013   Procedure: INCIDENTAL APPENDECTOMY;  Surgeon: Lynwood MALVA Pina, MD;  Location: [facility];  Service: General;;   AXILLARY SENTINEL NODE BIOPSY Right 10/22/2023   Procedure: BIOPSY, LYMPH NODE, SENTINEL, AXILLARY;  Surgeon: Ebbie Cough, MD;  Location: [facility];  Service: General;  Laterality: Right;  RIGHT MASTECTOMY RIGHT AXILLARY SENTINEL NODE BIOPSY LMA PEC BLOCK   BREAST BIOPSY Right 09/12/2023   US  RT BREAST BX W LOC DEV 1ST LESION IMG BX SPEC US  GUIDE 09/12/2023 [facility]   capsule endoscopy     COLON SURGERY     COLONOSCOPY     in 2012 -    COLOSTOMY CLOSURE  03/17/2013   COLOSTOMY REVERSAL  03/17/2013   Procedure: COLOSTOMY REVERSAL;  Surgeon: Lynwood MALVA Pina, MD;  Location: [facility];  Service: General;;   EUS N/A 04/30/2015   Procedure: UPPER ENDOSCOPIC ULTRASOUND (EUS) LINEAR;  Surgeon: Belvie Just, MD;   Location: [facility];  Service: Endoscopy;  Laterality: N/A;   HERNIA REPAIR     X2 2-4 y/o inguinal   JOINT REPLACEMENT  ORID Knee/right hip   2021/2022   LAPAROTOMY N/A 10/17/2012   Procedure: EXPLORATORY LAPAROTOMY,  COLON RESECTION AND COLOSTOMY;  Surgeon: Lynwood MALVA Pina, MD;  Location: [facility];  Service: General;  Laterality: N/A;   LIPOMA EXCISION Left    left arm   LYSIS OF ADHESION  03/17/2013   Procedure: LYSIS OF ADHESION;  Surgeon: Lynwood MALVA Pina, MD;  Location: [facility];  Service: General;;   ORIF PATELLA Left 02/20/2020   Procedure: OPEN REDUCTION INTERNAL (ORIF) FIXATION LEFT PATELLA;  Surgeon: Jerri Kay HERO, MD;  Location: [facility];  Service: Orthopedics;  Laterality: Left;   SMALL INTESTINE SURGERY  Perforated colon   2014   TOTAL HIP ARTHROPLASTY Right 09/12/2021   Procedure: RIGHT TOTAL HIP ARTHROPLASTY ANTERIOR APPROACH;  Surgeon: Jerri Kay HERO, MD;  Location: [facility];  Service: Orthopedics;  Laterality: Right;  3-C   TOTAL HIP ARTHROPLASTY Left 02/23/2023   Procedure: LEFT TOTAL HIP ARTHROPLASTY ANTERIOR APPROACH;  Surgeon: Jerri Kay HERO, MD;  Location: [facility];  Service: Orthopedics;  Laterality:  Left;  3-C   TOTAL MASTECTOMY Right 10/22/2023   Procedure: MASTECTOMY, SIMPLE;  Surgeon: Ebbie Cough, MD;  Location: [facility];  Service: General;  Laterality: Right;   UPPER GASTROINTESTINAL ENDOSCOPY     2013 -   Patient Active Problem List   Diagnosis Date Noted   S/P mastectomy, right 10/22/2023   Left otitis media 10/20/2023   Vertigo 10/20/2023   Malignant neoplasm of upper-outer quadrant of right breast in female, estrogen receptor negative (HCC) 09/21/2023   Mass of right breast 09/08/2023   Mild protein-calorie malnutrition (HCC) 06/05/2023   Status post total replacement of left hip 02/23/2023   History of colonic polyps 02/03/2022   Family history of early CAD 07/02/2020   Lipoma of abdominal wall 08/04/2019   Osteopenia  01/28/2019   Thrombocytopenia (HCC) 10/17/2017   Vaginal atrophy 06/01/2015   Cyst of pancreas 03/06/2015   Glaucoma suspect of both eyes 03/11/2014   Nuclear sclerosis of both eyes 03/11/2014   Routine general medical examination at a health care facility 12/11/2012   Hyperglycemia 12/11/2012   Essential hypertension, benign 12/11/2012    PCP:   REFERRING PROVIDER: Ebbie Cough, MD   REFERRING DIAG:  Right Breast Cancer   THERAPY DIAG:  Malignant neoplasm of upper-outer quadrant of right breast in female, estrogen receptor negative (HCC)  Abnormal posture  Stiffness of right shoulder, not elsewhere classified  Rationale for Evaluation and Treatment: Rehabilitation  ONSET DATE: 09/12/2023  SUBJECTIVE:                                                                                                                                                                                           SUBJECTIVE STATEMENT: I felt a little pop in my arm a few days after I was here, and now more of the tightness is from the elbow down. My incision is doing well. I feel like my ROM is doing well. I have been doing line dancing and chair yoga 1x/ week, but I really want to play Golf! The video was great and I learned a lot.  PERTINENT HISTORY:  Patient was diagnosed on 09/12/2023 with right grade 2 Invasive Lobular Carcinoma. It measures 1.1 cm and is located in the Upper-Outer quadrant. It is Triple Negative with a Ki67 of 10%. Pt had a recent breast MRI. Scheduled initially for Right lumpectomy with SLNB on 10/22/23, however, after MRI results decided to have a right Mastectomy with SLNB on 10/22/2023 and had 0+/6 LN's. She has decided not to do chemotherapy . He is putting me in a study with [employer]...  PATIENT GOALS:  Reassess how  my recovery is going related to arm function, pain, and swelling.  PAIN:  Are you having pain? No, just tender  PRECAUTIONS: Recent Surgery, right UE  Lymphedema risk, Bilateral THA, Left patellar ORIF   RED FLAGS: None   ACTIVITY LEVEL / LEISURE: anxious to resume light wts and golf   OBJECTIVE:   PATIENT SURVEYS:  QUICK DASH: 9.09 but left 1 blank  OBSERVATIONS: Mastectomy incision healing but with multiple small scabs and not yet ready for scar massage. Right axilla upper arm and antecubital fossa with several cords causing tighness but minimal ROM restrictions and not painful  POSTURE:  Forward head, rounded shoulders  LYMPHEDEMA ASSESSMENT:   UPPER EXTREMITY AROM/PROM:   A/PROM RIGHT   eval   RIGHT 11/19/2023 RIGHT   Shoulder extension 64 65   Shoulder flexion 155 151 160  Shoulder abduction 178 170 180  Shoulder internal rotation 64 70   Shoulder external rotation 114 114                           (Blank rows = not tested)   A/PROM LEFT   eval  Shoulder extension 64  Shoulder flexion 160  Shoulder abduction 180  Shoulder internal rotation 72  Shoulder external rotation 106                          (Blank rows = not tested)   CERVICAL AROM: All within functional limits:          UPPER EXTREMITY STRENGTH: WNL   LYMPHEDEMA ASSESSMENTS (in cm):    LANDMARK RIGHT   eval RIGHT 11/19/2023  10 cm proximal to olecranon process 22.4 22.2  Olecranon process 20.2 20.8  10 cm proximal to ulnar styloid process 18.6 18.0  Just proximal to ulnar styloid process 13.9 13.8  Across hand at thumb web space 18.9 18.7  At base of 2nd digit 5.6 5.6  (Blank rows = not tested)   LANDMARK LEFT   eval  10 cm proximal to olecranon process 22.4  Olecranon process 21.2  10 cm proximal to ulnar styloid process 18.5  Just proximal to ulnar styloid process 14.1  Across hand at thumb web space 18.5  At base of 2nd digit 5.7  (Blank rows = not tested)     Surgery type/Date: 10/22/2023 right Mastectomy with SLNB Number of lymph nodes removed: 0+/6 Current/past treatment (chemo, radiation, hormone therapy): declined  Chemo Other symptoms:  Heaviness/tightness Yes, tightness Pain No Pitting edema No Infections No Decreased scar mobility Yes Stemmer sign No  PATIENT EDUCATION:  12/03/2023 Several small scabs still present at incision site;cording very evident at axilla and upper arm, but not painful Reviewed wall slide for abd as this was the most difficult and modified her positioning Supine wand flexion and scaption x 3 ea Release techniques to release cording right UE PROM right shoulder flex, scaption, abd, ER Standing postural exercises: scapular retraction, shoulder extension, ER with yellow  x 7. Pt given yellow band for HEP and illustrated instructions Jobes flex, scapt, abd with back against wall x 7 ea in comfortable ROM Bilateral pec stetch in corner x 3, 20 sec Advised pt to count repetitions as we are testing her arms tolerance for activity. She will start with 7 reps 3x/week. She may progress to 10-12 reps in a week if doing fine. She may start putting x 10, chips x 5-10 starting with light club  every other day. Be sure to stretch well afterwards. She will email me with her progress and will return in 3 weeks for follow up     Access Code: [email or web address] URL: [email or web address] Date: 11/19/2023 Prepared by: Grayce Sheldon  Exercises 12/03/2023 Scap retraction, B. Ext, B ER all with yellow x 7, 3 D standing exs;flexion, scaption, abd with back against wall x 7 Bilateral pec stretch at wall x 3   - Standing 'L' Stretch at Counter  - 2 x daily - 7 x weekly - 1 sets - 3 reps - 20 hold - Supine Shoulder Flexion with Dowel  - 2 x daily - 7 x weekly - 1 sets - 5 reps - 5 hold - Single Arm Doorway Pec Stretch at 90 Degrees Abduction  - 1-2 x daily - 7 x weekly - 1 sets - 3 reps - 20 hold Education details: ABC video, SOZO screen set up, Cording, HEP update,avoid wts, golf presently until follow up, Scar massage only when scabs are off. Person educated: Patient Education method:  Explanation Education comprehension: verbalized understanding and returned demonstration  HOME EXERCISE PROGRAM: Reviewed previously given post op HEP. Added supine wand flex and scaption, pec doorway stretch, standing lat stretch  ASSESSMENT:  CLINICAL IMPRESSION: Pt had excellent improvement in shoulder ROM today with no pain or concerns of tightness from cording, although cording in axilla and upper arm is very visible and palpable. Explained we are using caution to test her arms tolerance so that cording does not become limiting. Her goal is to resume golfing and she is going slowly with strength and golf return to ease into her activities. Incision healing well but several scabs still present. Assess for readiness for scar massage next.  RE;EVAL Pt is s/p a Right Mastectomy with SLNB on 10/22/2023. She has decided against chemotherapy. She is making good progress, but incision not yet ready for scar massage, Cording is present and mildly tight with only mild ROM restriction and no significant pain. Pt was educated in gentle cording stretches and pectoral stretches, in addition to lat stretch and supine wand for flex and scaption. She will work on gentle stretching and we will follow up in 2 weeks. At that time if doing well she may be able to resume her light wts or TB. She will watch the ABC video so we may discuss next time. She may do a few putts but no swings yet until we know it won't flare up her cording. There is no sign of swelling. She will benefit from a follow up visit and possibly a few more as needed to address cording, ROM, strength and scar massage when ready. .  Pt will benefit from skilled therapeutic intervention to improve on the following deficits: Decreased knowledge of precautions, impaired UE functional use, pain, decreased ROM, postural dysfunction.   PT treatment/interventions: ADL/Self care home management, [phone]- PT Re-evaluation, 97110-Therapeutic exercises, 97530-  Therapeutic activity, [id number]- Neuromuscular re-education, 97535- Self Care, and [id number]- Manual therapy   GOALS: Goals reviewed with patient? Yes  LONG TERM GOALS:  (STG=LTG)  GOALS Name Target Date  Goal status  1 Pt will demonstrate she has regained full shoulder ROM and function post operatively compared to baselines.  Baseline: 12/31/2023 MET 12/03/2023  2 Pt will be able to resume light wts;1-2 lbs with difficulty or flare of cording 12/31/2023 INITIAL  3 Pt will be able to tolerate putting, short chips x 5-10 ea without flareups 12/31/2023 INITIAL     PLAN:  PT FREQUENCY/DURATION: 4 visits in 6 weeks prn  PLAN FOR NEXT SESSION: ready for scar massage?check cording, ROM, add 1# wts or progress band strength  if ready but limited reps,  pt to start putting, chips slowly, discuss ABC video if questions.   [facility]  [address], [address]  [place] [address]  [phone]  After Breast Cancer Class Video It is recommended you view the ABC class video to be educated on lymphedema risk reduction. This video lasts for about 30 minutes. It can be viewed on our website here: [email or web address]  Scar massage You can begin gentle scar massage to you incision sites. Gently place one hand on the incision and move the skin (without sliding on the skin) in various directions. Do this for a few minutes and then you can gently massage either coconut oil or vitamin E cream into the scars.  Compression garment You should continue wearing your compression bra until you feel like you no longer have swelling.  Home exercise Program Continue doing the exercises you were given until you feel like you can do them without feeling any tightness at the end.   Walking Program Studies show that 30 minutes of walking per day (fast enough to elevate your heart rate) can significantly reduce the risk of a cancer  recurrence. If you can't walk due to other medical reasons, we encourage you to find another activity you could do (like a stationary bike or water exercise).  Posture After breast cancer surgery, people frequently sit with rounded shoulders posture because it puts their incisions on slack and feels better. If you sit like this and scar tissue forms in that position, you can become very tight and have pain sitting or standing with good posture. Try to be aware of your posture and sit and stand up tall to heal properly.  Follow up PT: It is recommended you return every 3 months for the first 3 years following surgery to be assessed on the SOZO machine for an L-Dex score. This helps prevent clinically significant lymphedema in 95% of patients. These follow up screens are 10 minute appointments that you are not billed for.  Grayce JINNY Sheldon, PT 12/03/2023, 8:32 PM

## 2023-12-04 ENCOUNTER — Encounter: Payer: Self-pay | Admitting: Hematology and Oncology

## 2023-12-10 ENCOUNTER — Encounter: Payer: Self-pay | Admitting: Hematology and Oncology

## 2023-12-13 NOTE — Therapy (Signed)
 Error in epic. Pt not seen in clinic visit canceled for PT.

## 2023-12-19 DIAGNOSIS — H25813 Combined forms of age-related cataract, bilateral: Secondary | ICD-10-CM | POA: Diagnosis not present

## 2023-12-26 ENCOUNTER — Ambulatory Visit: Attending: General Surgery

## 2023-12-26 DIAGNOSIS — M25611 Stiffness of right shoulder, not elsewhere classified: Secondary | ICD-10-CM | POA: Insufficient documentation

## 2023-12-26 DIAGNOSIS — Z483 Aftercare following surgery for neoplasm: Secondary | ICD-10-CM | POA: Insufficient documentation

## 2023-12-26 DIAGNOSIS — Z171 Estrogen receptor negative status [ER-]: Secondary | ICD-10-CM | POA: Insufficient documentation

## 2023-12-26 DIAGNOSIS — R293 Abnormal posture: Secondary | ICD-10-CM | POA: Insufficient documentation

## 2023-12-26 DIAGNOSIS — C50411 Malignant neoplasm of upper-outer quadrant of right female breast: Secondary | ICD-10-CM | POA: Diagnosis present

## 2023-12-26 NOTE — Therapy (Signed)
 OUTPATIENT PHYSICAL THERAPY BREAST CANCER POST OP TREATMENT   Patient Name: Alyssa Robinson MRN: [id number] DOB:[birth year], 73 y.o., female Today's Date: 12/26/2023  END OF SESSION:  PT End of Session - 12/26/23 1001     Visit Number 4    Number of Visits 6    Date for PT Re-Evaluation 12/31/23    PT Start Time 1002    PT Stop Time 1100    PT Time Calculation (min) 58 min    Activity Tolerance Patient tolerated treatment well    Behavior During Therapy WFL for tasks assessed/performed          Past Medical History:  Diagnosis Date   Allergy 20-25 years ago   Anemia    hx   Arthritis 8-10 years ago   Blood transfusion without reported diagnosis 20 years ago   Bronchitis    Bronchitis    hx   Closed patellar sleeve fracture of left knee    Diverticulosis    GERD (gastroesophageal reflux disease)    occ   Headache(784.0)    migraines occ   Hypertension    no meds in over 41yrs   Pneumonia    Seasonal allergies    Past Surgical History:  Procedure Laterality Date   ABDOMINAL HYSTERECTOMY     73 y/o   APPENDECTOMY  03/17/2013   Procedure: INCIDENTAL APPENDECTOMY;  Surgeon: Lynwood MALVA Pina, MD;  Location: [facility];  Service: General;;   AXILLARY SENTINEL NODE BIOPSY Right 10/22/2023   Procedure: BIOPSY, LYMPH NODE, SENTINEL, AXILLARY;  Surgeon: Ebbie Cough, MD;  Location: [facility];  Service: General;  Laterality: Right;  RIGHT MASTECTOMY RIGHT AXILLARY SENTINEL NODE BIOPSY LMA PEC BLOCK   BREAST BIOPSY Right 09/12/2023   US  RT BREAST BX W LOC DEV 1ST LESION IMG BX SPEC US  GUIDE 09/12/2023 [facility]   capsule endoscopy     COLON SURGERY     COLONOSCOPY     in 2012 -    COLOSTOMY CLOSURE  03/17/2013   COLOSTOMY REVERSAL  03/17/2013   Procedure: COLOSTOMY REVERSAL;  Surgeon: Lynwood MALVA Pina, MD;  Location: [facility];  Service: General;;   EUS N/A 04/30/2015   Procedure: UPPER ENDOSCOPIC ULTRASOUND (EUS) LINEAR;  Surgeon: Belvie Just, MD;   Location: [facility];  Service: Endoscopy;  Laterality: N/A;   HERNIA REPAIR     X2 2-4 y/o inguinal   JOINT REPLACEMENT  ORID Knee/right hip   2021/2022   LAPAROTOMY N/A 10/17/2012   Procedure: EXPLORATORY LAPAROTOMY,  COLON RESECTION AND COLOSTOMY;  Surgeon: Lynwood MALVA Pina, MD;  Location: [facility];  Service: General;  Laterality: N/A;   LIPOMA EXCISION Left    left arm   LYSIS OF ADHESION  03/17/2013   Procedure: LYSIS OF ADHESION;  Surgeon: Lynwood MALVA Pina, MD;  Location: [facility];  Service: General;;   ORIF PATELLA Left 02/20/2020   Procedure: OPEN REDUCTION INTERNAL (ORIF) FIXATION LEFT PATELLA;  Surgeon: Jerri Kay HERO, MD;  Location: [facility];  Service: Orthopedics;  Laterality: Left;   SMALL INTESTINE SURGERY  Perforated colon   2014   TOTAL HIP ARTHROPLASTY Right 09/12/2021   Procedure: RIGHT TOTAL HIP ARTHROPLASTY ANTERIOR APPROACH;  Surgeon: Jerri Kay HERO, MD;  Location: [facility];  Service: Orthopedics;  Laterality: Right;  3-C   TOTAL HIP ARTHROPLASTY Left 02/23/2023   Procedure: LEFT TOTAL HIP ARTHROPLASTY ANTERIOR APPROACH;  Surgeon: Jerri Kay HERO, MD;  Location: [facility];  Service: Orthopedics;  Laterality:  Left;  3-C   TOTAL MASTECTOMY Right 10/22/2023   Procedure: MASTECTOMY, SIMPLE;  Surgeon: Ebbie Cough, MD;  Location: [facility];  Service: General;  Laterality: Right;   UPPER GASTROINTESTINAL ENDOSCOPY     2013 -   Patient Active Problem List   Diagnosis Date Noted   S/P mastectomy, right 10/22/2023   Left otitis media 10/20/2023   Vertigo 10/20/2023   Malignant neoplasm of upper-outer quadrant of right breast in female, estrogen receptor negative (HCC) 09/21/2023   Mass of right breast 09/08/2023   Mild protein-calorie malnutrition (HCC) 06/05/2023   Status post total replacement of left hip 02/23/2023   History of colonic polyps 02/03/2022   Family history of early CAD 07/02/2020   Lipoma of abdominal wall 08/04/2019   Osteopenia  01/28/2019   Thrombocytopenia (HCC) 10/17/2017   Vaginal atrophy 06/01/2015   Cyst of pancreas 03/06/2015   Glaucoma suspect of both eyes 03/11/2014   Nuclear sclerosis of both eyes 03/11/2014   Routine general medical examination at a health care facility 12/11/2012   Hyperglycemia 12/11/2012   Essential hypertension, benign 12/11/2012    PCP:   REFERRING PROVIDER: Ebbie Cough, MD   REFERRING DIAG:  Right Breast Cancer   THERAPY DIAG:  Malignant neoplasm of upper-outer quadrant of right breast in female, estrogen receptor negative (HCC)  Abnormal posture  Stiffness of right shoulder, not elsewhere classified  Rationale for Evaluation and Treatment: Rehabilitation  ONSET DATE: 09/12/2023  SUBJECTIVE:                                                                                                                                                                                           SUBJECTIVE STATEMENT: I guess the cording is still there but I don't feel it much and it doesn't interfere with motion. I haven't been to the golf course, but I have simulated pitching and putting in the apt. I can feel a little tightness in the axilla. Still line dancing for an hour, and chair yoga.  I am walking a lot 6-8,000 steps /day. I have not tried my wts yet.   PERTINENT HISTORY:  Patient was diagnosed on 09/12/2023 with right grade 2 Invasive Lobular Carcinoma. It measures 1.1 cm and is located in the Upper-Outer quadrant. It is Triple Negative with a Ki67 of 10%. Pt had a recent breast MRI. Scheduled initially for Right lumpectomy with SLNB on 10/22/23, however, after MRI results decided to have a right Mastectomy with SLNB on 10/22/2023 and had 0+/6 LN's. She has decided not to do chemotherapy . He is putting me in a study with [employer].[email or web address]  PATIENT GOALS:  Reassess how my recovery is going related to arm function, pain, and swelling.  PAIN:  Are you having pain? No, just  tender  PRECAUTIONS: Recent Surgery, right UE Lymphedema risk, Bilateral THA, Left patellar ORIF   RED FLAGS: None   ACTIVITY LEVEL / LEISURE: anxious to resume light wts and golf   OBJECTIVE:   PATIENT SURVEYS:  QUICK DASH: 9.09 but left 1 blank  OBSERVATIONS: Mastectomy incision healing but with multiple small scabs and not yet ready for scar massage. Right axilla upper arm and antecubital fossa with several cords causing tighness but minimal ROM restrictions and not painful  POSTURE:  Forward head, rounded shoulders  LYMPHEDEMA ASSESSMENT:   UPPER EXTREMITY AROM/PROM:   A/PROM RIGHT   eval   RIGHT 11/19/2023 RIGHT   Shoulder extension 64 65   Shoulder flexion 155 151 160  Shoulder abduction 178 170 180  Shoulder internal rotation 64 70   Shoulder external rotation 114 114                           (Blank rows = not tested)   A/PROM LEFT   eval  Shoulder extension 64  Shoulder flexion 160  Shoulder abduction 180  Shoulder internal rotation 72  Shoulder external rotation 106                          (Blank rows = not tested)   CERVICAL AROM: All within functional limits:          UPPER EXTREMITY STRENGTH: WNL   LYMPHEDEMA ASSESSMENTS (in cm):    LANDMARK RIGHT   eval RIGHT 11/19/2023  10 cm proximal to olecranon process 22.4 22.2  Olecranon process 20.2 20.8  10 cm proximal to ulnar styloid process 18.6 18.0  Just proximal to ulnar styloid process 13.9 13.8  Across hand at thumb web space 18.9 18.7  At base of 2nd digit 5.6 5.6  (Blank rows = not tested)   LANDMARK LEFT   eval  10 cm proximal to olecranon process 22.4  Olecranon process 21.2  10 cm proximal to ulnar styloid process 18.5  Just proximal to ulnar styloid process 14.1  Across hand at thumb web space 18.5  At base of 2nd digit 5.7  (Blank rows = not tested)     Surgery type/Date: 10/22/2023 right Mastectomy with SLNB Number of lymph nodes removed: 0+/6 Current/past treatment  (chemo, radiation, hormone therapy): declined Chemo Other symptoms:  Heaviness/tightness Yes, tightness Pain No Pitting edema No Infections No Decreased scar mobility Yes Stemmer sign No  PATIENT EDUCATION:  12/26/2023 Discussed pt progress Checked cording; still present but not limiting Cording release for right UE axilla, upper arm, at 120 deg abd Mastectomy scar mobilization PROM right shoulder flex, scaption, abd, Er 1# wts : pt performed her routine she does at home.punches, alternate diagonal reaches, shoulder height, biceps curls, triceps ext,, overhead presses to toe touches, chest flys,, bent over shoulder extension, alternate shoulder flexion 2 foam pads made for axillary border of bra ;felt good Discussed compression sleeves for airline travel. Gave info for pt to get at a [facility]. 12/03/2023 Several small scabs still present at incision site;cording very evident at axilla and upper arm, but not painful Reviewed wall slide for abd as this was the most difficult and modified her positioning Supine wand flexion and scaption x 3 ea Release techniques to release cording right  UE PROM right shoulder flex, scaption, abd, ER Standing postural exercises: scapular retraction, shoulder extension, ER with yellow  x 7. Pt given yellow band for HEP and illustrated instructions Jobes flex, scapt, abd with back against wall x 7 ea in comfortable ROM Bilateral pec stetch in corner x 3, 20 sec Advised pt to count repetitions as we are testing her arms tolerance for activity. She will start with 7 reps 3x/week. She may progress to 10-12 reps in a week if doing fine. She may start putting x 10, chips x 5-10 starting with light club every other day. Be sure to stretch well afterwards. She will email me with her progress and will return in 3 weeks for follow up     Access Code: [email or web address] URL: [email or web address] Date: 11/19/2023 Prepared by: Grayce Sheldon  Exercises 12/03/2023 Scap retraction, B. Ext, B ER all with yellow x 7, 3 D standing exs;flexion, scaption, abd with back against wall x 7 Bilateral pec stretch at wall x 3   - Standing 'L' Stretch at Counter  - 2 x daily - 7 x weekly - 1 sets - 3 reps - 20 hold - Supine Shoulder Flexion with Dowel  - 2 x daily - 7 x weekly - 1 sets - 5 reps - 5 hold - Single Arm Doorway Pec Stretch at 90 Degrees Abduction  - 1-2 x daily - 7 x weekly - 1 sets - 3 reps - 20 hold Education details: ABC video, SOZO screen set up, Cording, HEP update,avoid wts, golf presently until follow up, Scar massage only when scabs are off. Person educated: Patient Education method: Explanation Education comprehension: verbalized understanding and returned demonstration  HOME EXERCISE PROGRAM: Reviewed previously given post op HEP. Added supine wand flex and scaption, pec doorway stretch, standing lat stretch  ASSESSMENT:  CLINICAL IMPRESSION: Cording is still present but is not limiting. Pt has not gone to the actual golf course but will start going to a driving range to do some gentle chipping to test her arms tolerance. She performed her exercise routine in clinic with 1# today without exacerbation, but did feel some tightness. We discussed progression and pt will be place on hold unless she needs to return. She will see me Sept 29 for her SOZO screen. RE;EVAL Pt is s/p a Right Mastectomy with SLNB on 10/22/2023. She has decided against chemotherapy. She is making good progress, but incision not yet ready for scar massage, Cording is present and mildly tight with only mild ROM restriction and no significant pain. Pt was educated in gentle cording stretches and pectoral stretches, in addition to lat stretch and supine wand for flex and scaption. She will work on gentle stretching and we will follow up in 2 weeks. At that time if doing well she may be able to resume her light wts or TB. She will watch the ABC video  so we may discuss next time. She may do a few putts but no swings yet until we know it won't flare up her cording. There is no sign of swelling. She will benefit from a follow up visit and possibly a few more as needed to address cording, ROM, strength and scar massage when ready. .  Pt will benefit from skilled therapeutic intervention to improve on the following deficits: Decreased knowledge of precautions, impaired UE functional use, pain, decreased ROM, postural dysfunction.   PT treatment/interventions: ADL/Self care home management, [phone]- PT Re-evaluation, 97110-Therapeutic exercises, 97530- Therapeutic activity, [id number]- Neuromuscular re-education, [phone]-  Self Care, and [id number]- Manual therapy   GOALS: Goals reviewed with patient? Yes  LONG TERM GOALS:  (STG=LTG)  GOALS Name Target Date  Goal status  1 Pt will demonstrate she has regained full shoulder ROM and function post operatively compared to baselines.  Baseline: 12/31/2023 MET 12/03/2023  2 Pt will be able to resume light wts;1-2 lbs with difficulty or flare of cording 12/31/2023 In progress Did 1# today  3 Pt will be able to tolerate putting, short chips x 5-10 ea without flareups 12/31/2023 In Progress Doing in home but not on course     PLAN:  PT FREQUENCY/DURATION: 4 visits in 6 weeks prn  PLAN FOR NEXT SESSION: On hold,ready for scar massage?check cording, ROM, add 1# wts or progress band strength  if ready but limited reps,  pt to start putting, chips slowly, discuss ABC video if questions.   [facility]  [address], [address]  [place] [address]  [phone]  After Breast Cancer Class Video It is recommended you view the ABC class video to be educated on lymphedema risk reduction. This video lasts for about 30 minutes. It can be viewed on our website here: [email or web address]  Scar massage You can begin gentle scar massage  to you incision sites. Gently place one hand on the incision and move the skin (without sliding on the skin) in various directions. Do this for a few minutes and then you can gently massage either coconut oil or vitamin E cream into the scars.  Compression garment You should continue wearing your compression bra until you feel like you no longer have swelling.  Home exercise Program Continue doing the exercises you were given until you feel like you can do them without feeling any tightness at the end.   Walking Program Studies show that 30 minutes of walking per day (fast enough to elevate your heart rate) can significantly reduce the risk of a cancer recurrence. If you can't walk due to other medical reasons, we encourage you to find another activity you could do (like a stationary bike or water exercise).  Posture After breast cancer surgery, people frequently sit with rounded shoulders posture because it puts their incisions on slack and feels better. If you sit like this and scar tissue forms in that position, you can become very tight and have pain sitting or standing with good posture. Try to be aware of your posture and sit and stand up tall to heal properly.  Follow up PT: It is recommended you return every 3 months for the first 3 years following surgery to be assessed on the SOZO machine for an L-Dex score. This helps prevent clinically significant lymphedema in 95% of patients. These follow up screens are 10 minute appointments that you are not billed for.  Grayce JINNY Sheldon, PT 12/26/2023, 11:02 AM

## 2024-01-14 ENCOUNTER — Inpatient Hospital Stay: Attending: Hematology and Oncology | Admitting: Adult Health

## 2024-01-14 VITALS — BP 143/68 | HR 63 | Temp 97.8°F | Resp 18 | Ht 59.0 in | Wt 107.1 lb

## 2024-01-14 DIAGNOSIS — Z9189 Other specified personal risk factors, not elsewhere classified: Secondary | ICD-10-CM

## 2024-01-14 DIAGNOSIS — Z1231 Encounter for screening mammogram for malignant neoplasm of breast: Secondary | ICD-10-CM

## 2024-01-14 DIAGNOSIS — Z9011 Acquired absence of right breast and nipple: Secondary | ICD-10-CM | POA: Insufficient documentation

## 2024-01-14 DIAGNOSIS — Z171 Estrogen receptor negative status [ER-]: Secondary | ICD-10-CM | POA: Diagnosis not present

## 2024-01-14 DIAGNOSIS — C50411 Malignant neoplasm of upper-outer quadrant of right female breast: Secondary | ICD-10-CM | POA: Insufficient documentation

## 2024-01-14 NOTE — Progress Notes (Signed)
 SURVIVORSHIP VISIT:  BRIEF ONCOLOGIC HISTORY:  Oncology History  Malignant neoplasm of upper-outer quadrant of right breast in female, estrogen receptor negative (HCC)  09/12/2023 Initial Diagnosis   Palpable lump right breast with dimpling of the overlying skin for 2 months, mammogram and ultrasound: 1.1 cm asymmetry/mass UOQ right breast, axilla negative: Biopsy: Grade 2 invasive lobular cancer ER 0%, PR 0%, HER2 2+ by IHC, FISH negative ratio 1    09/24/2023 Cancer Staging   Staging form: Breast, AJCC 8th Edition - Clinical: Stage IB (cT1c, cN0, cM0, G2, ER-, PR-, HER2-) - Signed by Odean Potts, MD on 09/24/2023 Histologic grading system: 3 grade system   10/22/2023 Surgery   Right Brest Mastectomy: grade 2 invasive lobular carcinoma and LCIS; ER/PR/Her2 negative; 0/6 lymph nodes positive for metastases     INTERVAL HISTORY:  Alyssa Robinson to review her survivorship care plan detailing her treatment course for breast cancer, as well as monitoring long-term side effects of that treatment, education regarding health maintenance, screening, and overall wellness and health promotion.     Overall, Alyssa Robinson reports feeling quite well. She denies any needs today.    REVIEW OF SYSTEMS:  Review of Systems  Constitutional:  Negative for appetite change, chills, fatigue, fever and unexpected weight change.  HENT:   Negative for hearing loss, lump/mass and trouble swallowing.   Eyes:  Negative for eye problems and icterus.  Respiratory:  Negative for chest tightness, cough and shortness of breath.   Cardiovascular:  Negative for chest pain, leg swelling and palpitations.  Gastrointestinal:  Negative for abdominal distention, abdominal pain, constipation, diarrhea, nausea and vomiting.  Endocrine: Negative for hot flashes.  Genitourinary:  Negative for difficulty urinating.   Musculoskeletal:  Negative for arthralgias.  Skin:  Negative for itching and rash.  Neurological:  Negative for dizziness,  extremity weakness, headaches and numbness.  Hematological:  Negative for adenopathy. Does not bruise/bleed easily.  Psychiatric/Behavioral:  Negative for depression. The patient is not nervous/anxious.   Breast: Denies any new nodularity, masses, tenderness, nipple changes, or nipple discharge.       PAST MEDICAL/SURGICAL HISTORY:  Past Medical History:  Diagnosis Date   Allergy 20-25 years ago   Anemia    hx   Arthritis 8-10 years ago   Blood transfusion without reported diagnosis 20 years ago   Bronchitis    Bronchitis    hx   Closed patellar sleeve fracture of left knee    Diverticulosis    GERD (gastroesophageal reflux disease)    occ   Headache(784.0)    migraines occ   Hypertension    no meds in over 21yrs   Pneumonia    Seasonal allergies    Past Surgical History:  Procedure Laterality Date   ABDOMINAL HYSTERECTOMY     73 y/o   APPENDECTOMY  03/17/2013   Procedure: INCIDENTAL APPENDECTOMY;  Surgeon: Lynwood MALVA Pina, MD;  [facility];  Service: General;;   AXILLARY SENTINEL NODE BIOPSY Right 10/22/2023   Procedure: BIOPSY, LYMPH NODE, SENTINEL, AXILLARY;  Surgeon: Ebbie Cough, MD;  Location: [facility];  Service: General;  Laterality: Right;  RIGHT MASTECTOMY RIGHT AXILLARY SENTINEL NODE BIOPSY LMA PEC BLOCK   BREAST BIOPSY Right 09/12/2023   US  RT BREAST BX W LOC DEV 1ST LESION IMG BX SPEC US  GUIDE 09/12/2023 [facility]   capsule endoscopy     COLON SURGERY     COLONOSCOPY     in 2012 -    COLOSTOMY CLOSURE  03/17/2013   COLOSTOMY REVERSAL  03/17/2013   Procedure: COLOSTOMY REVERSAL;  Surgeon: Lynwood MALVA Pina, MD;  Location[facility];  Service: General;;   EUS N/A 04/30/2015   Procedure: UPPER ENDOSCOPIC ULTRASOUND (EUS) LINEAR;  Surgeon: Belvie Just, MD;  Location: [facility];  Service: Endoscopy;  Laterality: N/A;   HERNIA REPAIR     X2 2-4 y/o inguinal   JOINT REPLACEMENT  ORID Knee/right hip   2021/2022   LAPAROTOMY N/A  10/17/2012   Procedure: EXPLORATORY LAPAROTOMY,  COLON RESECTION AND COLOSTOMY;  Surgeon: Lynwood MALVA Pina, MD;  [facility];  Service: General;  Laterality: N/A;   LIPOMA EXCISION Left    left arm   LYSIS OF ADHESION  03/17/2013   Procedure: LYSIS OF ADHESION;  Surgeon: Lynwood MALVA Pina, MD;  [facility]: [facility];  Service: General;;   ORIF PATELLA Left 02/20/2020   Procedure: OPEN REDUCTION INTERNAL (ORIF) FIXATION LEFT PATELLA;  Surgeon: Jerri Kay HERO, MD;  Location: [facility];  Service: Orthopedics;  Laterality: Left;   SMALL INTESTINE SURGERY  Perforated colon   2014   TOTAL HIP ARTHROPLASTY Right 09/12/2021   Procedure: RIGHT TOTAL HIP ARTHROPLASTY ANTERIOR APPROACH;  Surgeon: Jerri Kay HERO, MD;  [facility];  Service: Orthopedics;  Laterality: Right;  3-C   TOTAL HIP ARTHROPLASTY Left 02/23/2023   Procedure: LEFT TOTAL HIP ARTHROPLASTY ANTERIOR APPROACH;  Surgeon: Jerri Kay HERO, MD;  [facility];  Service: Orthopedics;  Laterality: Left;  3-C   TOTAL MASTECTOMY Right 10/22/2023   Procedure: MASTECTOMY, SIMPLE;  Surgeon: Ebbie Cough, MD;  Location: [facility];  Service: General;  Laterality: Right;   UPPER GASTROINTESTINAL ENDOSCOPY     2013 -     ALLERGIES:  Allergies  Allergen Reactions   Caffeine Nausea And Vomiting and Palpitations    Makes heart race    Fluad Quadrivalent [Influenza Vac A&B Sa Adj Quad] Nausea And Vomiting    Pt allergic to the 65 and over version of the flu vaccine, tolerates regular flu vaccine    Sodium Lauryl Sulfate Rash    Swollen gums      CURRENT MEDICATIONS:  Outpatient Encounter Medications as of 01/14/2024  Medication Sig   loratadine (CLARITIN) 10 MG tablet Take 10 mg by mouth daily.   polyethylene glycol powder (GLYCOLAX /MIRALAX ) 17 GM/SCOOP powder Take by mouth once. Daily   Probiotic Product (ULTRAFLORA IMMUNE HEALTH PO) Take 1 capsule by mouth daily.   sodium chloride  (OCEAN) 0.65 % SOLN nasal  spray Place 2-3 sprays into both nostrils 2 (two) times daily.   methocarbamol  (ROBAXIN ) 500 MG tablet Take 1 tablet (500 mg total) by mouth every 8 (eight) hours as needed for muscle spasms.   traMADol  (ULTRAM ) 50 MG tablet Take 2 tablets (100 mg total) by mouth every 6 (six) hours as needed (mild pain).   No facility-administered encounter medications on file as of 01/14/2024.     ONCOLOGIC FAMILY HISTORY:  Family History  Problem Relation Age of Onset   Esophageal cancer Mother        and ovarian cancer   Alcohol abuse Mother    Arthritis Mother    Cancer Mother    Depression Mother    Heart disease Father    Alcohol abuse Father    Hypertension Father    Diabetes Father    Early death Father    Breast cancer Sister    Cancer Maternal Aunt    Cancer Maternal Aunt  Breast cancer Maternal Grandfather    Colon cancer Other    Pancreatic cancer Other    Stroke Neg Hx    Hyperlipidemia Neg Hx    Kidney disease Neg Hx    COPD Neg Hx      SOCIAL HISTORY:  Social History   Socioeconomic History   Marital status: Married    Spouse name: Not on file   Number of children: Not on file   Years of education: Not on file   Highest education level: Bachelor's degree (e.g., BA, AB, BS)  Occupational History   Not on file  Tobacco Use   Smoking status: Never    Passive exposure: Past   Smokeless tobacco: Never   Tobacco comments:    Second hand smoke with mother/father smoking  Vaping Use   Vaping status: Never Used  Substance and Sexual Activity   Alcohol use: Not Currently    Alcohol/week: 2.0 standard drinks of alcohol   Drug use: No   Sexual activity: Yes    Birth control/protection: Post-menopausal  Other Topics Concern   Not on file  Social History Narrative   Not on file   Social Drivers of Health   Financial Resource Strain: Low Risk  (10/11/2023)   Overall Financial Resource Strain (CARDIA)    Difficulty of Paying Living Expenses: Not hard at all  Food  Insecurity: No Food Insecurity (10/11/2023)   Hunger Vital Sign    Worried About Running Out of Food in the Last Year: Never true    Ran Out of Food in the Last Year: Never true  Transportation Needs: No Transportation Needs (10/11/2023)   PRAPARE - Administrator, Civil Service (Medical): No    Lack of Transportation (Non-Medical): No  Physical Activity: Sufficiently Active (10/11/2023)   Exercise Vital Sign    Days of Exercise per Week: 5 days    Minutes of Exercise per Session: 30 min  Stress: No Stress Concern Present (10/11/2023)   [employer] of Occupational Health - Occupational Stress Questionnaire    Feeling of Stress: Not at all  Social Connections: Socially Integrated (10/11/2023)   Social Connection and Isolation Panel    Frequency of Communication with Friends and Family: More than three times a week    Frequency of Social Gatherings with Friends and Family: More than three times a week    Attends Religious Services: More than 4 times per year    Active Member of [employer] or Organizations: Yes    Attends Engineer, structural: More than 4 times per year    Marital Status: Married  Catering manager Violence: Not At Risk (09/24/2023)   Humiliation, Afraid, Rape, and Kick questionnaire    Fear of Current or Ex-Partner: No    Emotionally Abused: No    Physically Abused: No    Sexually Abused: No     OBSERVATIONS/OBJECTIVE:  BP (!) 143/68 (BP Location: Left Arm, Patient Position: Sitting)   Pulse 63   Temp 97.8 F (36.6 C) (Tympanic)   Resp 18   Ht 4' 11 (1.499 m)   Wt 107 lb 1.6 oz (48.6 kg)   SpO2 100%   BMI 21.63 kg/m  GENERAL: Patient is a well appearing female in no acute distress HEENT:  Sclerae anicteric.  Oropharynx clear and moist. No ulcerations or evidence of oropharyngeal candidiasis. Neck is supple.  NODES:  No cervical, supraclavicular, or axillary lymphadenopathy palpated.  BREAST EXAM:  right breast s/p mastectomy, no sign of  recurrence, left breast benign LUNGS:  Clear to auscultation bilaterally.  No wheezes or rhonchi. HEART:  Regular rate and rhythm. No murmur appreciated. ABDOMEN:  Soft, nontender.  Positive, normoactive bowel sounds. No organomegaly palpated. MSK:  No focal spinal tenderness to palpation. Full range of motion bilaterally in the upper extremities. EXTREMITIES:  No peripheral edema.   SKIN:  Clear with no obvious rashes or skin changes. No nail dyscrasia. NEURO:  Nonfocal. Well oriented.  Appropriate affect.   LABORATORY DATA:  None for this visit.  DIAGNOSTIC IMAGING:  None for this visit.      ASSESSMENT AND PLAN:  Ms.. Mcilvain is a pleasant 73 y.o. 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  She presents to the Survivorship Clinic for our initial meeting and routine follow-up post-completion of treatment for breast cancer.    1. Stage IB right breast cancer:  Ms. Cruzen is continuing to recover from definitive treatment for breast cancer. She will follow-up with her medical oncologist, Dr.  Gudena in 10/2024 with history and physical exam per surveillance protocol.  She will continue with annual left breast mammograms next due in 05/2024.  Since she has dense breasts and her breast cancer was mammographically occult, we discussed breast MRI which she would like to undergo in 11/2024.  She is also undergoing   Today, a comprehensive survivorship care plan and treatment summary was reviewed with the patient today detailing her breast cancer diagnosis, treatment course, potential late/long-term effects of treatment, appropriate follow-up care with recommendations for the future, and patient education resources.  A copy of this summary, along with a letter will be sent to the patient's primary care provider via mail/fax/In Basket message after today's visit.    2. Bone health:   She was given education on specific  activities to promote bone health.  3. Cancer screening:  Due to Ms. Goth's history and her age, she should receive screening for skin cancers, colon cancer, and gynecologic cancers.  The information and recommendations are listed on the patient's comprehensive care plan/treatment summary and were reviewed in detail with the patient.    4. Health maintenance and wellness promotion: Ms. Stipp was encouraged to consume 5-7 servings of fruits and vegetables per day. We reviewed the Nutrition Rainbow handout.  She was also encouraged to engage in moderate to vigorous exercise for 30 minutes per day most days of the week.  She was instructed to limit her alcohol consumption and continue to abstain from tobacco use.     5. Support services/counseling: It is not uncommon for this period of the patient's cancer care trajectory to be one of many emotions and stressors.   She was given information regarding our available services and encouraged to contact me with any questions or for help enrolling in any of our support group/programs.    Follow up instructions:    -Return to cancer center in 10/2024 for f/u with Dr. Odean -Follow up with [facility] Ebbie at [facility] 04/2024  -Mammogram due in 05/2024 -Breast MRI 11/2024 -Guardant reveal testing every 6 months -She is welcome to return back to the Survivorship Clinic at any time; no additional follow-up needed at this time.  -Consider referral back to survivorship as a long-term survivor for continued surveillance  The patient was provided an opportunity to ask questions and all were answered. The patient agreed with the plan and demonstrated an understanding of the instructions.   Total encounter  time:45 minutes*in face-to-face visit time, chart review, lab review, care coordination, order entry, and documentation of the encounter time.    Alyssa Kendall, NP 01/14/24 12:37 PM Medical Oncology and Hematology [facility] [address] [place], [place] [address] Tel. [phone]    Fax. [phone]  *Total Encounter Time as defined by the Centers for Medicare and Medicaid Services includes, in addition to the face-to-face time of a patient visit (documented in the note above) non-face-to-face time: obtaining and reviewing outside history, ordering and reviewing medications, tests or procedures, care coordination (communications with other health care professionals or caregivers) and documentation in the medical record.

## 2024-01-19 NOTE — Therapy (Incomplete)
 OUTPATIENT PHYSICAL THERAPY SOZO SCREENING NOTE   Patient Name: Alyssa Robinson MRN: [id number] DOB:07/15/1950, 73 y.o., female Today's Date: 01/19/2024  PCP: Rollene Almarie LABOR, MD REFERRING PROVIDER: Ebbie Cough, MD    Past Medical History:  Diagnosis Date   Allergy 20-25 years ago   Anemia    hx   Arthritis 8-10 years ago   Blood transfusion without reported diagnosis 20 years ago   Bronchitis    Bronchitis    hx   Closed patellar sleeve fracture of left knee    Diverticulosis    GERD (gastroesophageal reflux disease)    occ   Headache(784.0)    migraines occ   Hypertension    no meds in over 48yrs   Pneumonia    Seasonal allergies    Past Surgical History:  Procedure Laterality Date   ABDOMINAL HYSTERECTOMY     73 y/o   APPENDECTOMY  03/17/2013   Procedure: INCIDENTAL APPENDECTOMY;  Surgeon: Lynwood MALVA Pina, MD;  Location[facility];  Service: General;;   AXILLARY SENTINEL NODE BIOPSY Right 10/22/2023   Procedure: BIOPSY, LYMPH NODE, SENTINEL, AXILLARY;  Surgeon: Ebbie Cough, MD;  Location: [facility];  Service: General;  Laterality: Right;  RIGHT MASTECTOMY RIGHT AXILLARY SENTINEL NODE BIOPSY LMA PEC BLOCK   BREAST BIOPSY Right 09/12/2023   US  RT BREAST BX W LOC DEV 1ST LESION IMG BX SPEC US  GUIDE 09/12/2023 [facility]AMMO[facility]APHY   capsule endoscopy     COLON SURGERY     COLONOSCOPY     in 2012 -    COLOSTOMY CLOSURE  03/17/2013   COLOSTOMY REVERSAL  03/17/2013   Procedure: COLOSTOMY REVERSAL;  Surgeon: Lynwood MALVA Pina, MD;  [facility];  Service: General;;   EUS N/A 04/30/2015   Procedure: UPPER ENDOSCOPIC ULTRASOUND (EUS) LINEAR;  Surgeon: Belvie Just, MD;  [facility]: [facility];  Service: Endoscopy;  Laterality: N/A;   HERNIA REPAIR     X2 2-4 y/o inguinal   JOINT REPLACEMENT  ORID Knee/right hip   2021/2022   LAPAROTOMY N/A 10/17/2012   Procedure: EXPLORATORY LAPAROTOMY,  COLON RESECTION AND COLOSTOMY;  Surgeon: Lynwood MALVA Pina,  MD;  [facility]: [facility];  Service: General;  Laterality: N/A;   LIPOMA EXCISION Left    left arm   LYSIS OF ADHESION  03/17/2013   Procedure: LYSIS OF ADHESION;  Surgeon: Lynwood MALVA Pina, MD;  [facility];  Service: General;;   ORIF PATELLA Left 02/20/2020   Procedure: OPEN REDUCTION INTERNAL (ORIF) FIXATION LEFT PATELLA;  Surgeon: Jerri Kay HERO, MD;  Location: [facility];  Service: Orthopedics;  Laterality: Left;   SMALL INTESTINE SURGERY  Perforated colon   2014   TOTAL HIP ARTHROPLASTY Right 09/12/2021   Procedure: RIGHT TOTAL HIP ARTHROPLASTY ANTERIOR APPROACH;  Surgeon: Jerri Kay HERO, MD;  [facility];  Service: Orthopedics;  Laterality: Right;  3-C   TOTAL HIP ARTHROPLASTY Left 02/23/2023   Procedure: LEFT TOTAL HIP ARTHROPLASTY ANTERIOR APPROACH;  Surgeon: Jerri Kay HERO, MD;  [facility];  Service: Orthopedics;  Laterality: Left;  3-C   TOTAL MASTECTOMY Right 10/22/2023   Procedure: MASTECTOMY, SIMPLE;  Surgeon: Ebbie Cough, MD;  Location: [facility];  Service: General;  Laterality: Right;   UPPER GASTROINTESTINAL ENDOSCOPY     2013 -   Patient Active Problem List   Diagnosis Date Noted   S/P mastectomy, right 10/22/2023   Left otitis media 10/20/2023   Vertigo 10/20/2023   Malignant neoplasm  of upper-outer quadrant of right breast in female, estrogen receptor negative (HCC) 09/21/2023   Mass of right breast 09/08/2023   Mild protein-calorie malnutrition 06/05/2023   Status post total replacement of left hip 02/23/2023   History of colonic polyps 02/03/2022   Family history of early CAD 07/02/2020   Lipoma of abdominal wall 08/04/2019   Osteopenia 01/28/2019   Thrombocytopenia 10/17/2017   Vaginal atrophy 06/01/2015   Cyst of pancreas 03/06/2015   Glaucoma suspect of both eyes 03/11/2014   Nuclear sclerosis of both eyes 03/11/2014   Routine general medical examination at a health care facility 12/11/2012   Hyperglycemia 12/11/2012    Essential hypertension, benign 12/11/2012    REFERRING DIAG: right breast cancer at risk for lymphedema  THERAPY DIAG:  No diagnosis found.  PERTINENT HISTORY:  Patient was diagnosed on 09/12/2023 with right grade 2 Invasive Lobular Carcinoma. It measures 1.1 cm and is located in the Upper-Outer quadrant. It is Triple Negative . Scheduled initially for Right lumpectomy with SLNB on 10/22/23, however, after MRI results decided to have a right Mastectomy with SLNB on 10/22/2023 and had 0+/6 LN's. She has decided not to do chemotherapy . He is putting me in a study with [facility] health...   PRECAUTIONS: right UE Lymphedema risk,   SUBJECTIVE: ***  PAIN:  Are you having pain? {OPRCPAIN:27236}  SOZO SCREENING: Patient was assessed today using the SOZO machine to determine the lymphedema index score. This was compared to her baseline score. It was determined that she is within the recommended range when compared to her baseline and no further action is needed at this time. She will continue SOZO screenings. These are done every 3 months for 2 years post operatively followed by every 6 months for 2 years, and then annually.  Patient was assessed today using the SOZO machine to determine the lymphedema index score. This was compared to her baseline score. It was determined that she is NOT within the recommended range when compared to her baseline and so she was fitted for a compression garment while in the clinic today. It is recommended she return in 1 month to be reassessed. If she continues to measure outside the recommended range, physical therapy treatment will be recommended at that time and a referral requested.    Grayce JINNY Sheldon, PT 01/19/2024, 10:21 PM

## 2024-01-21 ENCOUNTER — Ambulatory Visit

## 2024-01-21 DIAGNOSIS — Z483 Aftercare following surgery for neoplasm: Secondary | ICD-10-CM

## 2024-01-21 NOTE — Telephone Encounter (Signed)
 A few questions from our discussion on 01/14/24.  [facility] on [address] called and set up my MR Breast Bilateral W WO contrast INC CAD (set for 11/24/2024)  [facility] called again to set up my 3D screen mammogram which would be around May 26, 2024 (not set yet)  -is this a regular or diagnostic mammogram?  -is [facility] performing the mammogram or the Breast Center? I would prefer the Breast Center  -Should I get a flu shot (regular), because I have had reactions to the over 65 flu shot? And if you recommend the shot, I assume I should receive the shot in my left arm?   Patient sent me the above email.  Please let her know that her left breast mammogram is screening, and we can change it to the breast center.    Can you ask what the reaction was to the flu shot?    Thanks, LC

## 2024-01-21 NOTE — Therapy (Signed)
 OUTPATIENT PHYSICAL THERAPY SOZO SCREENING NOTE   Patient Name: Alyssa Robinson MRN: [id number] DOB:[birth year], 73 y.o., female Today's Date: 01/21/2024  PCP: Rollene Almarie LABOR, MD REFERRING PROVIDER: Ebbie Cough, MD   PT End of Session - 01/21/24 1738     Visit Number 4    PT Start Time 1205    PT Stop Time 1220    PT Time Calculation (min) 15 min    Activity Tolerance Patient tolerated treatment well    Behavior During Therapy WFL for tasks assessed/performed          Past Medical History:  Diagnosis Date   Allergy 20-25 years ago   Anemia    hx   Arthritis 8-10 years ago   Blood transfusion without reported diagnosis 20 years ago   Bronchitis    Bronchitis    hx   Closed patellar sleeve fracture of left knee    Diverticulosis    GERD (gastroesophageal reflux disease)    occ   Headache(784.0)    migraines occ   Hypertension    no meds in over 61yrs   Pneumonia    Seasonal allergies    Past Surgical History:  Procedure Laterality Date   ABDOMINAL HYSTERECTOMY     73 y/o   APPENDECTOMY  03/17/2013   Procedure: INCIDENTAL APPENDECTOMY;  Surgeon: Lynwood MALVA Pina, MD;  Location: [facility];  Service: General;;   AXILLARY SENTINEL NODE BIOPSY Right 10/22/2023   Procedure: BIOPSY, LYMPH NODE, SENTINEL, AXILLARY;  Surgeon: Ebbie Cough, MD;  Location: [facility];  Service: General;  Laterality: Right;  RIGHT MASTECTOMY RIGHT AXILLARY SENTINEL NODE BIOPSY LMA PEC BLOCK   BREAST BIOPSY Right 09/12/2023   US  RT BREAST BX W LOC DEV 1ST LESION IMG BX SPEC US  GUIDE 09/12/2023 [facility]   capsule endoscopy     COLON SURGERY     COLONOSCOPY     in 2012 -    COLOSTOMY CLOSURE  03/17/2013   COLOSTOMY REVERSAL  03/17/2013   Procedure: COLOSTOMY REVERSAL;  Surgeon: Lynwood MALVA Pina, MD;  Location: [facility];  Service: General;;   EUS N/A 04/30/2015   Procedure: UPPER ENDOSCOPIC ULTRASOUND (EUS) LINEAR;  Surgeon: Belvie Just, MD;  Location: [facility];  Service: Endoscopy;  Laterality: N/A;   HERNIA REPAIR     X2 2-4 y/o inguinal   JOINT REPLACEMENT  ORID Knee/right hip   2021/2022   LAPAROTOMY N/A 10/17/2012   Procedure: EXPLORATORY LAPAROTOMY,  COLON RESECTION AND COLOSTOMY;  Surgeon: Lynwood MALVA Pina, MD;  Location: [facility];  Service: General;  Laterality: N/A;   LIPOMA EXCISION Left    left arm   LYSIS OF ADHESION  03/17/2013   Procedure: LYSIS OF ADHESION;  Surgeon: Lynwood MALVA Pina, MD;  Location: [facility];  Service: General;;   ORIF PATELLA Left 02/20/2020   Procedure: OPEN REDUCTION INTERNAL (ORIF) FIXATION LEFT PATELLA;  Surgeon: Jerri Kay HERO, MD;  Location: [facility];  Service: Orthopedics;  Laterality: Left;   SMALL INTESTINE SURGERY  Perforated colon   2014   TOTAL HIP ARTHROPLASTY Right 09/12/2021   Procedure: RIGHT TOTAL HIP ARTHROPLASTY ANTERIOR APPROACH;  Surgeon: Jerri Kay HERO, MD;  Location: [facility];  Service: Orthopedics;  Laterality: Right;  3-C   TOTAL HIP ARTHROPLASTY Left 02/23/2023   Procedure: LEFT TOTAL HIP ARTHROPLASTY ANTERIOR APPROACH;  Surgeon: Jerri Kay HERO, MD;  Location: [facility];  Service: Orthopedics;  Laterality: Left;  3-C   TOTAL MASTECTOMY Right 10/22/2023  Procedure: MASTECTOMY, SIMPLE;  Surgeon: Ebbie Cough, MD;  Location: [facility];  Service: General;  Laterality: Right;   UPPER GASTROINTESTINAL ENDOSCOPY     2013 -   Patient Active Problem List   Diagnosis Date Noted   S/P mastectomy, right 10/22/2023   Left otitis media 10/20/2023   Vertigo 10/20/2023   Malignant neoplasm of upper-outer quadrant of right breast in female, estrogen receptor negative (HCC) 09/21/2023   Mass of right breast 09/08/2023   Mild protein-calorie malnutrition 06/05/2023   Status post total replacement of left hip 02/23/2023   History of colonic polyps 02/03/2022   Family history of early CAD 07/02/2020   Lipoma of abdominal wall 08/04/2019   Osteopenia 01/28/2019    Thrombocytopenia 10/17/2017   Vaginal atrophy 06/01/2015   Cyst of pancreas 03/06/2015   Glaucoma suspect of both eyes 03/11/2014   Nuclear sclerosis of both eyes 03/11/2014   Routine general medical examination at a health care facility 12/11/2012   Hyperglycemia 12/11/2012   Essential hypertension, benign 12/11/2012    REFERRING DIAG: right breast cancer at risk for lymphedema  THERAPY DIAG:  Aftercare following surgery for neoplasm  PERTINENT HISTORY: Patient was diagnosed on 09/12/2023 with right grade 2 Invasive Lobular Carcinoma. It measures 1.1 cm and is located in the Upper-Outer quadrant. It is Triple Negative with a Ki67 of 10%. Pt had a recent breast MRI. Scheduled initially for Right lumpectomy with SLNB on 10/22/23, however, after MRI results decided to have a right Mastectomy with SLNB on 10/22/2023 and had 0+/6 LN's. She has decided not to do chemotherapy . He is putting me in a study with [employer]   PRECAUTIONS: right UE Lymphedema risk  SUBJECTIVE: Here for SOZO screen  PAIN:  Are you having pain? No  SOZO SCREENING: Patient was assessed today using the SOZO machine to determine the lymphedema index score. This was compared to her baseline score. It was determined that she is within the recommended range when compared to her baseline and no further action is needed at this time. She will continue SOZO screenings. These are done every 3 months for 2 years post operatively followed by every 6 months for 2 years, and then annually.   L-DEX FLOWSHEETS - 01/21/24 1700       L-DEX LYMPHEDEMA SCREENING   Measurement Type Unilateral    L-DEX MEASUREMENT EXTREMITY Upper Extremity    POSITION  Standing    DOMINANT SIDE Right    At Risk Side Right    BASELINE SCORE (UNILATERAL) -2.8    L-DEX SCORE (UNILATERAL) 1.1    VALUE CHANGE (UNILAT) 3.9         Eward Wonda Sharps, [place] 01/21/24 5:42 PM

## 2024-01-22 ENCOUNTER — Encounter: Payer: Self-pay | Admitting: General Surgery

## 2024-02-03 DIAGNOSIS — Z1389 Encounter for screening for other disorder: Secondary | ICD-10-CM | POA: Diagnosis not present

## 2024-02-03 DIAGNOSIS — J029 Acute pharyngitis, unspecified: Secondary | ICD-10-CM | POA: Diagnosis not present

## 2024-02-03 DIAGNOSIS — R519 Headache, unspecified: Secondary | ICD-10-CM | POA: Diagnosis not present

## 2024-02-03 DIAGNOSIS — Z6821 Body mass index (BMI) 21.0-21.9, adult: Secondary | ICD-10-CM | POA: Diagnosis not present

## 2024-02-15 ENCOUNTER — Ambulatory Visit: Admitting: Orthopaedic Surgery

## 2024-02-20 ENCOUNTER — Ambulatory Visit: Admitting: Orthopaedic Surgery

## 2024-02-20 ENCOUNTER — Other Ambulatory Visit (INDEPENDENT_AMBULATORY_CARE_PROVIDER_SITE_OTHER): Payer: Self-pay

## 2024-02-20 DIAGNOSIS — Z96641 Presence of right artificial hip joint: Secondary | ICD-10-CM | POA: Diagnosis not present

## 2024-02-20 DIAGNOSIS — Z96642 Presence of left artificial hip joint: Secondary | ICD-10-CM | POA: Diagnosis not present

## 2024-02-20 NOTE — Progress Notes (Signed)
   Office Visit Note   Patient: Alyssa Robinson           Date of Birth: [birth year]           MRN: [id number] Visit Date: 02/20/2024              Requested by: Rollene Almarie LABOR, MD [address] [place],  [place] [address] PCP: Rollene Almarie LABOR, MD   Assessment & Plan: Visit Diagnoses:  1. Status post total replacement of left hip   2. Status post total replacement of right hip     Plan: Alyssa Robinson has done very well from her surgeries.  I would like to see her 1 more time for her left hip in a year for 2-year postop visit.  Questions encouraged and answered.  Follow-Up Instructions: Return in about 1 year (around 02/19/2025).   Orders:  Orders Placed This Encounter  Procedures   XR Pelvis 1-2 Views   No orders of the defined types were placed in this encounter.  Subjective: Chief Complaint  Patient presents with   Left Hip - Follow-up    Left THA 02/23/2023    HPI Alyssa Robinson is a very pleasant 73 year old female who is 1 year status post left total hip arthroplasty and more than 2 years status post right total hip arthroplasty.  She is doing well overall and has no complaints regarding her hip.  Recently was diagnosed with breast cancer and finished treatment for this whichonly required surgery.  She is feeling well overall.  She is able to do all of her desired activities.  Ortho Exam Examination of both hips show fully healed surgical scars.  Fluid painless range of motion of the hips.  She has a normal gait pattern.  Imaging: XR Pelvis 1-2 Views Result Date: 02/20/2024 Stable total hip replacements without complications

## 2024-02-25 ENCOUNTER — Encounter: Payer: Self-pay | Admitting: Radiology

## 2024-03-14 DIAGNOSIS — R21 Rash and other nonspecific skin eruption: Secondary | ICD-10-CM | POA: Diagnosis not present

## 2024-03-14 DIAGNOSIS — J069 Acute upper respiratory infection, unspecified: Secondary | ICD-10-CM | POA: Diagnosis not present

## 2024-03-25 ENCOUNTER — Other Ambulatory Visit: Payer: Self-pay | Admitting: General Surgery

## 2024-03-25 DIAGNOSIS — R234 Changes in skin texture: Secondary | ICD-10-CM

## 2024-03-25 DIAGNOSIS — Z853 Personal history of malignant neoplasm of breast: Secondary | ICD-10-CM

## 2024-04-14 ENCOUNTER — Ambulatory Visit: Attending: General Surgery

## 2024-04-14 VITALS — Wt 107.4 lb

## 2024-04-14 DIAGNOSIS — Z483 Aftercare following surgery for neoplasm: Secondary | ICD-10-CM | POA: Insufficient documentation

## 2024-04-14 NOTE — Therapy (Signed)
 " OUTPATIENT PHYSICAL THERAPY SOZO SCREENING NOTE   Patient Name: Alyssa Robinson MRN: [id number] DOB:17-Jan-1951, 73 y.o., female Today's Date: 04/14/2024  PCP: Rollene Almarie LABOR, MD REFERRING PROVIDER: Ebbie Cough, MD   PT End of Session - 04/14/24 1039     Visit Number 4   # unchanged due to screen only   PT Start Time 1039    PT Stop Time 1043    PT Time Calculation (min) 4 min    Activity Tolerance Patient tolerated treatment well    Behavior During Therapy WFL for tasks assessed/performed          Past Medical History:  Diagnosis Date   Allergy 20-25 years ago   Anemia    hx   Arthritis 8-10 years ago   Blood transfusion without reported diagnosis 20 years ago   Bronchitis    Bronchitis    hx   Closed patellar sleeve fracture of left knee    Diverticulosis    GERD (gastroesophageal reflux disease)    occ   Headache(784.0)    migraines occ   Hypertension    no meds in over 86yrs   Pneumonia    Seasonal allergies    Past Surgical History:  Procedure Laterality Date   ABDOMINAL HYSTERECTOMY     73 y/o   APPENDECTOMY  03/17/2013   Procedure: INCIDENTAL APPENDECTOMY;  Surgeon: Lynwood MALVA Pina, MD;  [facility];  Service: General;;   AXILLARY SENTINEL NODE BIOPSY Right 10/22/2023   Procedure: BIOPSY, LYMPH NODE, SENTINEL, AXILLARY;  Surgeon: Ebbie Cough, MD;  Location: [facility];  Service: General;  Laterality: Right;  RIGHT MASTECTOMY RIGHT AXILLARY SENTINEL NODE BIOPSY LMA PEC BLOCK   BREAST BIOPSY Right 09/12/2023   US  RT BREAST BX W LOC DEV 1ST LESION IMG BX SPEC US  GUIDE 09/12/2023 [facility]   capsule endoscopy     COLON SURGERY     COLONOSCOPY     in 2012 -    COLOSTOMY CLOSURE  03/17/2013   COLOSTOMY REVERSAL  03/17/2013   Procedure: COLOSTOMY REVERSAL;  Surgeon: Lynwood MALVA Pina, MD;  [facility];  Service: General;;   EUS N/A 04/30/2015   Procedure: UPPER ENDOSCOPIC ULTRASOUND (EUS) LINEAR;  Surgeon:  Belvie Just, MD;  Location: [facility];  Service: Endoscopy;  Laterality: N/A;   HERNIA REPAIR     X2 2-4 y/o inguinal   JOINT REPLACEMENT  ORID Knee/right hip   2021/2022   LAPAROTOMY N/A 10/17/2012   Procedure: EXPLORATORY LAPAROTOMY,  COLON RESECTION AND COLOSTOMY;  Surgeon: Lynwood MALVA Pina, MD;  [facility]: [facility];  Service: General;  Laterality: N/A;   LIPOMA EXCISION Left    left arm   LYSIS OF ADHESION  03/17/2013   Procedure: LYSIS OF ADHESION;  Surgeon: Lynwood MALVA Pina, MD;  [facility]: [facility];  Service: General;;   ORIF PATELLA Left 02/20/2020   Procedure: OPEN REDUCTION INTERNAL (ORIF) FIXATION LEFT PATELLA;  Surgeon: Jerri Kay HERO, MD;  Location: [facility];  Service: Orthopedics;  Laterality: Left;   SMALL INTESTINE SURGERY  Perforated colon   2014   TOTAL HIP ARTHROPLASTY Right 09/12/2021   Procedure: RIGHT TOTAL HIP ARTHROPLASTY ANTERIOR APPROACH;  Surgeon: Jerri Kay HERO, MD;  Location: [facility];  Service: Orthopedics;  Laterality: Right;  3-C   TOTAL HIP ARTHROPLASTY Left 02/23/2023   Procedure: LEFT TOTAL HIP ARTHROPLASTY ANTERIOR APPROACH;  Surgeon: Jerri Kay HERO, MD;  Location: [facility];  Service: Orthopedics;  Laterality: Left;  3-C   TOTAL MASTECTOMY Right 10/22/2023   Procedure: MASTECTOMY, SIMPLE;  Surgeon: Ebbie Cough, MD;  Location: [facility];  Service: General;  Laterality: Right;   UPPER GASTROINTESTINAL ENDOSCOPY     2013 -   Patient Active Problem List   Diagnosis Date Noted   S/P mastectomy, right 10/22/2023   Left otitis media 10/20/2023   Vertigo 10/20/2023   Malignant neoplasm of upper-outer quadrant of right breast in female, estrogen receptor negative (HCC) 09/21/2023   Mass of right breast 09/08/2023   Mild protein-calorie malnutrition 06/05/2023   Status post total replacement of left hip 02/23/2023   History of colonic polyps 02/03/2022   Family history of early CAD 07/02/2020   Lipoma of abdominal wall 08/04/2019    Osteopenia 01/28/2019   Thrombocytopenia 10/17/2017   Vaginal atrophy 06/01/2015   Cyst of pancreas 03/06/2015   Glaucoma suspect of both eyes 03/11/2014   Nuclear sclerosis of both eyes 03/11/2014   Routine general medical examination at a health care facility 12/11/2012   Hyperglycemia 12/11/2012   Essential hypertension, benign 12/11/2012    REFERRING DIAG: right breast cancer at risk for lymphedema  THERAPY DIAG:  Aftercare following surgery for neoplasm  PERTINENT HISTORY: Patient was diagnosed on 09/12/2023 with right grade 2 Invasive Lobular Carcinoma. It measures 1.1 cm and is located in the Upper-Outer quadrant. It is Triple Negative with a Ki67 of 10%. Pt had a recent breast MRI. Scheduled initially for Right lumpectomy with SLNB on 10/22/23, however, after MRI results decided to have a right Mastectomy with SLNB on 10/22/2023 and had 0+/6 LN's. She has decided not to do chemotherapy . He is putting me in a study with [employer]   PRECAUTIONS: right UE Lymphedema risk  SUBJECTIVE: Pt returns for her 3 month L-Dex screen.   PAIN:  Are you having pain? No  SOZO SCREENING: Patient was assessed today using the SOZO machine to determine the lymphedema index score. This was compared to her baseline score. It was determined that she is within the recommended range when compared to her baseline and no further action is needed at this time. She will continue SOZO screenings. These are done every 3 months for 2 years post operatively followed by every 6 months for 2 years, and then annually.   L-DEX FLOWSHEETS - 04/14/24 1000       L-DEX LYMPHEDEMA SCREENING   Measurement Type Unilateral    L-DEX MEASUREMENT EXTREMITY Upper Extremity    POSITION  Standing    DOMINANT SIDE Right    At Risk Side Right    BASELINE SCORE (UNILATERAL) -2.8    L-DEX SCORE (UNILATERAL) -0.5    VALUE CHANGE (UNILAT) 2.3          P: Cont every 3 month L-Dex screens until 09/2025, then can  transition to every 6 months until 09/2027.   Berwyn Knights, PTA 04/14/2024 10:43 AM    "

## 2024-04-28 ENCOUNTER — Ambulatory Visit
Admission: RE | Admit: 2024-04-28 | Discharge: 2024-04-28 | Disposition: A | Discharge status: Home / Self Care | Source: Ambulatory Visit | Attending: General Surgery | Admitting: General Surgery

## 2024-04-28 DIAGNOSIS — Z853 Personal history of malignant neoplasm of breast: Secondary | ICD-10-CM

## 2024-04-28 DIAGNOSIS — R234 Changes in skin texture: Secondary | ICD-10-CM

## 2024-04-30 ENCOUNTER — Ambulatory Visit: Admitting: Internal Medicine

## 2024-04-30 ENCOUNTER — Encounter: Payer: Self-pay | Admitting: Internal Medicine

## 2024-04-30 VITALS — BP 122/70 | HR 70 | Temp 98.3°F | Ht 59.0 in | Wt 110.4 lb

## 2024-04-30 DIAGNOSIS — Z853 Personal history of malignant neoplasm of breast: Secondary | ICD-10-CM | POA: Diagnosis not present

## 2024-04-30 DIAGNOSIS — I10 Essential (primary) hypertension: Secondary | ICD-10-CM | POA: Diagnosis not present

## 2024-04-30 DIAGNOSIS — R739 Hyperglycemia, unspecified: Secondary | ICD-10-CM | POA: Diagnosis not present

## 2024-04-30 NOTE — Assessment & Plan Note (Signed)
Labs up to date

## 2024-04-30 NOTE — Assessment & Plan Note (Signed)
 Getting mammogram per schedule.

## 2024-04-30 NOTE — Assessment & Plan Note (Signed)
 BP at goal without meds due to lifestyle changes. Continue to monitor BP.

## 2024-04-30 NOTE — Progress Notes (Signed)
" ° °  Subjective:   Patient ID: Alyssa Robinson, female    DOB: [birth year], 74 y.o.   MRN: [id number]  The patient is a 74 YO female coming in for follow up and form completion to move into friend's home independent living. No change to health currently. No recurrence of SBO and still keeping soft diet.   PMH, [facility], social history reviewed and updated  Review of Systems  Constitutional: Negative.   HENT: Negative.    Eyes: Negative.   Respiratory:  Negative for cough, chest tightness and shortness of breath.   Cardiovascular:  Negative for chest pain, palpitations and leg swelling.  Gastrointestinal:  Negative for abdominal distention, abdominal pain, constipation, diarrhea, nausea and vomiting.  Musculoskeletal: Negative.   Skin: Negative.   Neurological: Negative.   Psychiatric/Behavioral: Negative.      Objective:  Physical Exam Constitutional:      Appearance: She is well-developed.  HENT:     Head: Normocephalic and atraumatic.  Cardiovascular:     Rate and Rhythm: Normal rate and regular rhythm.  Pulmonary:     Effort: Pulmonary effort is normal. No respiratory distress.     Breath sounds: Normal breath sounds. No wheezing or rales.  Abdominal:     General: Bowel sounds are normal. There is no distension.     Palpations: Abdomen is soft.     Tenderness: There is no abdominal tenderness.  Musculoskeletal:     Cervical back: Normal range of motion.  Skin:    General: Skin is warm and dry.  Neurological:     Mental Status: She is alert and oriented to person, place, and time.     Coordination: Coordination normal.     Vitals:   04/30/24 0931  BP: 122/70  Pulse: 70  Temp: 98.3 F (36.8 C)  TempSrc: Oral  SpO2: 97%  Weight: 110 lb 6.4 oz (50.1 kg)  Height: 4' 11 (1.499 m)   I personally spent a total of 21 minutes in the care of the patient today including preparing to see the patient, counseling and educating, placing orders, and referring and communicating with  other health care professionals.   "

## 2024-06-19 ENCOUNTER — Ambulatory Visit

## 2024-07-04 ENCOUNTER — Ambulatory Visit

## 2024-07-14 ENCOUNTER — Ambulatory Visit: Attending: General Surgery

## 2024-07-16 ENCOUNTER — Ambulatory Visit

## 2024-11-18 ENCOUNTER — Ambulatory Visit: Admitting: Hematology and Oncology

## 2024-11-24 ENCOUNTER — Other Ambulatory Visit

## 2025-02-19 ENCOUNTER — Ambulatory Visit: Admitting: Orthopaedic Surgery
